# Patient Record
Sex: Male | Born: 1960 | Race: Black or African American | Hispanic: No | Marital: Single | Smoking: Current every day smoker
Health system: Southern US, Community
[De-identification: ages and names within clinical notes are randomized; demographics above are authoritative.]

## PROBLEM LIST (undated history)

## (undated) DIAGNOSIS — D638 Anemia in other chronic diseases classified elsewhere: Secondary | ICD-10-CM

## (undated) DIAGNOSIS — F17200 Nicotine dependence, unspecified, uncomplicated: Secondary | ICD-10-CM

## (undated) DIAGNOSIS — I96 Gangrene, not elsewhere classified: Secondary | ICD-10-CM

## (undated) DIAGNOSIS — E119 Type 2 diabetes mellitus without complications: Secondary | ICD-10-CM

---

## 2012-01-08 ENCOUNTER — Emergency Department: Payer: Self-pay | Admitting: Emergency Medicine

## 2012-07-14 ENCOUNTER — Emergency Department: Payer: Self-pay | Admitting: Emergency Medicine

## 2012-07-14 LAB — BASIC METABOLIC PANEL
Anion Gap: 8 (ref 7–16)
BUN: 9 mg/dL (ref 7–18)
Chloride: 106 mmol/L (ref 98–107)
Co2: 26 mmol/L (ref 21–32)
Creatinine: 0.8 mg/dL (ref 0.60–1.30)
EGFR (Non-African Amer.): >60
Osmolality: 277 (ref 275–301)
Potassium: 3.8 mmol/L (ref 3.5–5.1)

## 2012-07-14 LAB — CBC
HCT: 35.9 % — ABNORMAL LOW (ref 40.0–52.0)
MCHC: 33.8 g/dL (ref 32.0–36.0)
MCV: 100 fL (ref 80–100)
RBC: 3.59 10*6/uL — ABNORMAL LOW (ref 4.40–5.90)
WBC: 7.1 10*3/uL (ref 3.8–10.6)

## 2015-05-23 ENCOUNTER — Encounter: Payer: Self-pay | Admitting: Emergency Medicine

## 2015-05-23 ENCOUNTER — Emergency Department
Admission: EM | Admit: 2015-05-23 | Discharge: 2015-05-23 | Disposition: A | Discharge status: Home / Self Care | Payer: BLUE CROSS/BLUE SHIELD | Attending: Emergency Medicine | Admitting: Emergency Medicine

## 2015-05-23 ENCOUNTER — Emergency Department: Payer: BLUE CROSS/BLUE SHIELD

## 2015-05-23 DIAGNOSIS — M25462 Effusion, left knee: Secondary | ICD-10-CM | POA: Diagnosis not present

## 2015-05-23 DIAGNOSIS — M25562 Pain in left knee: Secondary | ICD-10-CM | POA: Diagnosis present

## 2015-05-23 DIAGNOSIS — Z720 Tobacco use: Secondary | ICD-10-CM | POA: Diagnosis not present

## 2015-05-23 DIAGNOSIS — M1712 Unilateral primary osteoarthritis, left knee: Secondary | ICD-10-CM | POA: Diagnosis not present

## 2015-05-23 MED ORDER — DEXAMETHASONE SODIUM PHOSPHATE 10 MG/ML IJ SOLN
10.0000 mg | Freq: Once | INTRAMUSCULAR | Status: AC
  Administered 2015-05-23: 10 mg via INTRA_ARTICULAR
  Filled 2015-05-23: qty 1

## 2015-05-23 MED ORDER — NAPROXEN 500 MG PO TBEC: 500.0000 mg | DELAYED_RELEASE_TABLET | Freq: Two times a day (BID) | ORAL | Status: DC

## 2015-05-23 MED ORDER — LIDOCAINE HCL (PF) 1 % IJ SOLN
5.0000 mL | Freq: Once | INTRAMUSCULAR | Status: DC
  Filled 2015-05-23: qty 5

## 2015-05-23 NOTE — ED Notes (Signed)
Patient with no complaints at this time. Respirations even and unlabored. Skin warm/dry. Discharge instructions reviewed with patient at this time. Patient given opportunity to voice concerns/ask questions. Patient discharged at this time and left Emergency Department with steady gait.   

## 2015-05-23 NOTE — ED Provider Notes (Signed)
[facility] Emergency Department Provider Note ____________________________________________  Time seen: 1955  I have reviewed the triage vital signs and the nursing notes.  HISTORY  Chief Complaint  Knee Pain  HPI [name] [name] is a 54 y.o. male reports to the ED for evaluation management of a swollen left knee without trauma. The patient admits to ongoing swelling to the left knee for about the last 6-8 weeks. He has noted disability related to fully flexing the knee at times. He notes his symptoms are aggravated by his prolonged walking and standing activities as an [employer]. He denies anyprevious episodes of joint swelling, prior joint aspiration, or any history of gout. He reports his pain at a 10/10 in triage.  History reviewed. No pertinent past medical history.  There are no active problems to display for this patient.  History reviewed. No pertinent past surgical history.  Current Outpatient Rx  Name  Route  Sig  Dispense  Refill  . naproxen (EC NAPROSYN) 500 MG EC tablet   Oral   Take 1 tablet (500 mg total) by mouth 2 (two) times daily with a meal.   30 tablet   0    Allergies Review of patient's allergies indicates not on file.  No family history on file.  Social History Social History  Substance Use Topics  . Smoking status: Current Every Day Smoker -- 0.50 packs/day    Types: Cigarettes  . Smokeless tobacco: None  . Alcohol Use: Yes   Review of Systems  Constitutional: Negative for fever. Eyes: Negative for visual changes. ENT: Negative for sore throat. Cardiovascular: Negative for chest pain. Respiratory: Negative for shortness of breath. Gastrointestinal: Negative for abdominal pain, vomiting and diarrhea. Genitourinary: Negative for dysuria. Musculoskeletal: Negative for back pain. Left knee swelling and disability as above. Skin: Negative for rash. Neurological: Negative for headaches, focal weakness or  numbness. ____________________________________________  PHYSICAL EXAM:  VITAL SIGNS: ED Triage Vitals  Enc Vitals Group     BP 05/23/15 1741 108/68 mmHg     Pulse Rate 05/23/15 1741 84     Resp 05/23/15 1741 20     Temp 05/23/15 1741 98.4 F (36.9 C)     Temp Source 05/23/15 1741 Oral     SpO2 05/23/15 1741 96 %     Weight 05/23/15 1741 240 lb (108.863 kg)     Height 05/23/15 1741  (1.93 m)     Head Cir --      Peak Flow --      Pain Score 05/23/15 1742 10     Pain Loc --      Pain Edu? --      Excl. in GC? --     Constitutional: Alert and oriented. Well appearing and in no distress. Eyes: Conjunctivae are normal. PERRL. Normal extraocular movements. ENT   Head: Normocephalic and atraumatic.   Nose: No congestion/rhinorrhea.   Mouth/Throat: Mucous membranes are moist.   Neck: Supple. No thyromegaly. Hematological/Lymphatic/Immunological: No cervical lymphadenopathy. Cardiovascular: Normal rate, regular rhythm.  Respiratory: Normal respiratory effort. No wheezes/rales/rhonchi. Gastrointestinal: Soft and nontender. No distention. Musculoskeletal: Patient's left knee with obvious bony deformity consistent with DJD. He is also noted to have a large joint effusion. He has limited flexion range second to the effusion. The knee otherwise is without indication of abrasion, laceration, warmth, erythema, or infectious process. Nontender with normal range of motion in all extremities.  Neurologic:  Normal gait without ataxia. Normal speech and language. No gross  focal neurologic deficits are appreciated. Skin:  Skin is warm, dry and intact. No rash noted. Psychiatric: Mood and affect are normal. Patient exhibits appropriate insight and judgment. ____________________________________________   RADIOLOGY Left Knee IMPRESSION: Mildly progressive tricompartmental osteoarthritis with large joint effusion, similar to prior study. No acute osseous findings.  I, Lankford Gutzmer,  Charlesetta Ivory, personally viewed and evaluated these images (plain radiographs) as part of my medical decision making.  ____________________________________________  PROCEDURES  Apiration of blood/fluid  Performed by: Lissa Hoard  After consent was obtained, using sterile technique the left knee was prepped and 3 ml's of 1% plain Lidocaine used to anesthetize the needle tract into the joint from the Lateral suprapatellar approach.   The knee joint was entered and 140 ml's of clear, yellow colored fluid was withdrawn and sent for culture.  Steroid 10 mg was then injected and the needle withdrawn.    The procedure was well tolerated.   ____________________________________________  INITIAL IMPRESSION / ASSESSMENT AND PLAN / ED COURSE  Moderate left knee osteoarthritis with a chronic joint effusion. Patient with a therapeutic joint aspiration.  The patient is asked to continue to rest the knee for a few more days before resuming regular activities.  It may be more painful for the first 1-2 days.  Watch for fever, or increased swelling or persistent pain in knee. Call or return to clinic prn if such symptoms occur or the knee fails to improve as anticipated. Prescription Naprosyn was provided and a work note for out of work 1 days given. Follow Dr. Marinus Maw, if needed. ____________________________________________  FINAL CLINICAL IMPRESSION(S) / ED DIAGNOSES  Final diagnoses:  Primary osteoarthritis of left knee  Knee effusion, left      Lissa Hoard, PA-C 05/23/15 2150  Rockne Menghini, MD 06/04/15 [phone]

## 2015-05-23 NOTE — Discharge Instructions (Signed)
Osteoarthritis Osteoarthritis is a disease that causes soreness and inflammation of a joint. It occurs when the cartilage at the affected joint wears down. Cartilage acts as a cushion, covering the ends of bones where they meet to form a joint. Osteoarthritis is the most common form of arthritis. It often occurs in older people. The joints affected most often by this condition include those in the:  Ends of the fingers.  Thumbs.  Neck.  Lower back.  Knees.  Hips. CAUSES  Over time, the cartilage that covers the ends of bones begins to wear away. This causes bone to rub on bone, producing pain and stiffness in the affected joints.  RISK FACTORS Certain factors can increase your chances of having osteoarthritis, including:  Older age.  Excessive body weight.  Overuse of joints.  Previous joint injury. SIGNS AND SYMPTOMS   Pain, swelling, and stiffness in the joint.  Over time, the joint may lose its normal shape.  Small deposits of bone (osteophytes) may grow on the edges of the joint.  Bits of bone or cartilage can break off and float inside the joint space. This may cause more pain and damage. DIAGNOSIS  Your health care provider will do a physical exam and ask about your symptoms. Various tests may be ordered, such as:  X-rays of the affected joint.  An MRI scan.  Blood tests to rule out other types of arthritis.  Joint fluid tests. This involves using a needle to draw fluid from the joint and examining the fluid under a microscope. TREATMENT  Goals of treatment are to control pain and improve joint function. Treatment plans may include:  A prescribed exercise program that allows for rest and joint relief.  A weight control plan.  Pain relief techniques, such as:  Properly applied heat and cold.  Electric pulses delivered to nerve endings under the skin (transcutaneous electrical nerve stimulation [TENS]).  Massage.  Certain nutritional  supplements.  Medicines to control pain, such as:  Acetaminophen.  Nonsteroidal anti-inflammatory drugs (NSAIDs), such as naproxen.  Narcotic or central-acting agents, such as tramadol.  Corticosteroids. These can be given orally or as an injection.  Surgery to reposition the bones and relieve pain (osteotomy) or to remove loose pieces of bone and cartilage. Joint replacement may be needed in advanced states of osteoarthritis. HOME CARE INSTRUCTIONS   Take medicines only as directed by your health care provider.  Maintain a healthy weight. Follow your health care provider's instructions for weight control. This may include dietary instructions.  Exercise as directed. Your health care provider can recommend specific types of exercise. These may include:  Strengthening exercises. These are done to strengthen the muscles that support joints affected by arthritis. They can be performed with weights or with exercise bands to add resistance.  Aerobic activities. These are exercises, such as brisk walking or low-impact aerobics, that get your heart pumping.  Range-of-motion activities. These keep your joints limber.  Balance and agility exercises. These help you maintain daily living skills.  Rest your affected joints as directed by your health care provider.  Keep all follow-up visits as directed by your health care provider. SEEK MEDICAL CARE IF:   Your skin turns red.  You develop a rash in addition to your joint pain.  You have worsening joint pain.  You have a fever along with joint or muscle aches. SEEK IMMEDIATE MEDICAL CARE IF:  You have a significant loss of weight or appetite.  You have night sweats. FOR MORE  INFORMATION   [employer] of Arthritis and Musculoskeletal and Skin Diseases: www.niams.http://www.myers.net/  [employer] on Aging: https://walker.com/  American College of Rheumatology: www.rheumatology.org Document Released: 08/14/2005 Document Revised:  12/29/2013 Document Reviewed: 04/21/2013 [facility] Patient Information 2015 [place], [place]. This information is not intended to replace advice given to you by your health care provider. Make sure you discuss any questions you have with your health care provider. Knee Effusion  Knee effusion means you have fluid in your knee. The knee may be more difficult to bend and move. HOME CARE  Use crutches or a brace as told by your doctor.  Put ice on the injured area.  Put ice in a plastic bag.  Place a towel between your skin and the bag.  Leave the ice on for 15-20 minutes, 03-04 times a day.  Raise (elevate) your knee as much as possible.  Only take medicine as told by your doctor.  You may need to do strengthening exercises. Ask your doctor.  Continue with your normal diet and activities as told by your doctor. GET HELP RIGHT AWAY IF:  You have more puffiness (swelling) in your knee.  You see redness, puffiness, or have more pain in your knee.  You have a temperature by mouth above 102 F (38.9 C).  You get a rash.  You have trouble breathing.  You have a reaction to any medicine you are taking.  You have a lot of pain when you move your knee. MAKE SURE YOU:  Understand these instructions.  Will watch your condition.  Will get help right away if you are not doing well or get worse. Document Released: 09/16/2010 Document Revised: 11/06/2011 Document Reviewed: 09/16/2010 [facility] Patient Information 2015 [place], [place]. This information is not intended to replace advice given to you by your health care provider. Make sure you discuss any questions you have with your health care provider.  Knee Injection Joint injections are shots. Your caregiver will place a needle into your knee joint. The needle is used to put medicine into the joint. These shots can be used to help treat different painful knee conditions such as osteoarthritis, bursitis, local flare-ups of rheumatoid  arthritis, and pseudogout. Anti-inflammatory medicines such as corticosteroids and anesthetics are the most common medicines used for joint and soft tissue injections.  PROCEDURE  The skin over the kneecap will be cleaned with an antiseptic solution.  Your caregiver will inject a small amount of a local anesthetic (a medicine like Novocaine) just under the skin in the area that was cleaned.  After the area becomes numb, a second injection is done. This second injection usually includes an anesthetic and an anti-inflammatory medicine called a steroid or cortisone. The needle is carefully placed in between the kneecap and the knee, and the medicine is injected into the joint space.  After the injection is done, the needle is removed. Your caregiver may place a bandage over the injection site. The whole procedure takes no more than a couple of minutes. BEFORE THE PROCEDURE  Wash all of the skin around the entire knee area. Try to remove any loose, scaling skin. There is no other specific preparation necessary unless advised otherwise by your caregiver. LET YOUR CAREGIVER KNOW ABOUT:   Allergies.  Medications taken including herbs, eye drops, over the counter medications, and creams.  Use of steroids (by mouth or creams).  Possible pregnancy, if applicable.  Previous problems with anesthetics or Novocaine.  History of blood clots (thrombophlebitis).  History of bleeding  or blood problems.  Previous surgery.  Other health problems. RISKS AND COMPLICATIONS Side effects from cortisone shots are rare. They include:   Slight bruising of the skin.  Shrinkage of the normal fatty tissue under the skin where the shot was given.  Increase in pain after the shot.  Infection.  Weakening of tendons or tendon rupture.  Allergic reaction to the medicine.  Diabetics may have a temporary increase in their blood sugar after a shot.  Cortisone can temporarily weaken the immune system. While  receiving these shots, you should not get certain vaccines. Also, avoid contact with anyone who has chickenpox or measles. Especially if you have never had these diseases or have not been previously immunized. Your immune system may not be strong enough to fight off the infection while the cortisone is in your system. AFTER THE PROCEDURE   You can go home after the procedure.  You may need to put ice on the joint 15-20 minutes every 3 or 4 hours until the pain goes away.  You may need to put an elastic bandage on the joint. HOME CARE INSTRUCTIONS   Only take over-the-counter or prescription medicines for pain, discomfort, or fever as directed by your caregiver.  You should avoid stressing the joint. Unless advised otherwise, avoid activities that put a lot of pressure on a knee joint, such as:  Jogging.  Bicycling.  Recreational climbing.  Hiking.  Laying down and elevating the leg/knee above the level of your heart can help to minimize swelling. SEEK MEDICAL CARE IF:   You have repeated or worsening swelling.  There is drainage from the puncture area.  You develop red streaking that extends above or below the site where the needle was inserted. SEEK IMMEDIATE MEDICAL CARE IF:   You develop a fever.  You have pain that gets worse even though you are taking pain medicine.  The area is red and warm, and you have trouble moving the joint. MAKE SURE YOU:   Understand these instructions.  Will watch your condition.  Will get help right away if you are not doing well or get worse. Document Released: 11/05/2006 Document Revised: 11/06/2011 Document Reviewed: 08/02/2007 [facility] Patient Information 2015 [place], [place]. This information is not intended to replace advice given to you by your health care provider. Make sure you discuss any questions you have with your health care provider.  Rest with the knee elevated when seated. Apply ice to reduce pain and swelling. Take the  prescription anti-inflammatory as directed for pain and swelling. Follow-up with a primary care provider or Dr. Rosita Kea as needed.

## 2015-05-23 NOTE — ED Notes (Signed)
Pt presents to the ER with left knee pain that started about 2 days ago per pt. Noticeable swelling to left knee, pt denies any injury to area.

## 2015-05-27 LAB — BODY FLUID CULTURE
Culture: NO GROWTH
Gram Stain: NONE SEEN
Special Requests: NORMAL

## 2015-07-11 ENCOUNTER — Inpatient Hospital Stay
Admission: EM | Admit: 2015-07-11 | Discharge: 2015-07-14 | DRG: 853 | Disposition: A | Discharge status: Home / Self Care | Payer: BLUE CROSS/BLUE SHIELD | Attending: Internal Medicine | Admitting: Internal Medicine

## 2015-07-11 ENCOUNTER — Encounter: Payer: Self-pay | Admitting: Emergency Medicine

## 2015-07-11 ENCOUNTER — Inpatient Hospital Stay: Payer: BLUE CROSS/BLUE SHIELD

## 2015-07-11 ENCOUNTER — Emergency Department: Payer: BLUE CROSS/BLUE SHIELD

## 2015-07-11 DIAGNOSIS — A419 Sepsis, unspecified organism: Secondary | ICD-10-CM | POA: Diagnosis present

## 2015-07-11 DIAGNOSIS — I96 Gangrene, not elsewhere classified: Secondary | ICD-10-CM | POA: Diagnosis present

## 2015-07-11 DIAGNOSIS — E860 Dehydration: Secondary | ICD-10-CM | POA: Diagnosis present

## 2015-07-11 DIAGNOSIS — R6510 Systemic inflammatory response syndrome (SIRS) of non-infectious origin without acute organ dysfunction: Secondary | ICD-10-CM

## 2015-07-11 DIAGNOSIS — E876 Hypokalemia: Secondary | ICD-10-CM | POA: Diagnosis present

## 2015-07-11 DIAGNOSIS — F17210 Nicotine dependence, cigarettes, uncomplicated: Secondary | ICD-10-CM | POA: Diagnosis present

## 2015-07-11 DIAGNOSIS — D638 Anemia in other chronic diseases classified elsewhere: Secondary | ICD-10-CM

## 2015-07-11 DIAGNOSIS — L0390 Cellulitis, unspecified: Secondary | ICD-10-CM | POA: Diagnosis present

## 2015-07-11 DIAGNOSIS — L97509 Non-pressure chronic ulcer of other part of unspecified foot with unspecified severity: Secondary | ICD-10-CM | POA: Diagnosis present

## 2015-07-11 DIAGNOSIS — D72829 Elevated white blood cell count, unspecified: Secondary | ICD-10-CM

## 2015-07-11 DIAGNOSIS — L089 Local infection of the skin and subcutaneous tissue, unspecified: Secondary | ICD-10-CM | POA: Diagnosis not present

## 2015-07-11 DIAGNOSIS — Z7289 Other problems related to lifestyle: Secondary | ICD-10-CM | POA: Diagnosis not present

## 2015-07-11 DIAGNOSIS — Z720 Tobacco use: Secondary | ICD-10-CM

## 2015-07-11 DIAGNOSIS — E11621 Type 2 diabetes mellitus with foot ulcer: Secondary | ICD-10-CM

## 2015-07-11 DIAGNOSIS — A480 Gas gangrene: Secondary | ICD-10-CM | POA: Diagnosis present

## 2015-07-11 LAB — CBC WITH DIFFERENTIAL/PLATELET
BASOS ABS: 0.1 10*3/uL (ref 0–0.1)
Basophils Relative: 1 %
EOS PCT: 2 %
Eosinophils Absolute: 0.3 10*3/uL (ref 0–0.7)
HEMATOCRIT: 40.3 % (ref 40.0–52.0)
Hemoglobin: 13.1 g/dL (ref 13.0–18.0)
LYMPHS ABS: 1.1 10*3/uL (ref 1.0–3.6)
LYMPHS PCT: 6 %
MCH: 32.7 pg (ref 26.0–34.0)
MCHC: 32.6 g/dL (ref 32.0–36.0)
MCV: 100.1 fL — AB (ref 80.0–100.0)
MONO ABS: 1.8 10*3/uL — AB (ref 0.2–1.0)
MONOS PCT: 10 %
Neutro Abs: 15.3 10*3/uL — ABNORMAL HIGH (ref 1.4–6.5)
Neutrophils Relative %: 81 %
PLATELETS: 419 10*3/uL (ref 150–440)
RBC: 4.02 MIL/uL — AB (ref 4.40–5.90)
RDW: 11.8 % (ref 11.5–14.5)
WBC: 18.6 10*3/uL — ABNORMAL HIGH (ref 3.8–10.6)

## 2015-07-11 LAB — COMPREHENSIVE METABOLIC PANEL
ALT: 30 U/L (ref 17–63)
AST: 21 U/L (ref 15–41)
Albumin: 3.3 g/dL — ABNORMAL LOW (ref 3.5–5.0)
Alkaline Phosphatase: 94 U/L (ref 38–126)
Anion gap: 12 (ref 5–15)
BUN: 7 mg/dL (ref 6–20)
CHLORIDE: 100 mmol/L — AB (ref 101–111)
CO2: 24 mmol/L (ref 22–32)
CREATININE: 0.69 mg/dL (ref 0.61–1.24)
Calcium: 8.9 mg/dL (ref 8.9–10.3)
GFR calc Af Amer: >60 mL/min (ref >60)
GLUCOSE: 73 mg/dL (ref 65–99)
Potassium: 3.3 mmol/L — ABNORMAL LOW (ref 3.5–5.1)
Sodium: 136 mmol/L (ref 135–145)
Total Bilirubin: 0.8 mg/dL (ref 0.3–1.2)
Total Protein: 7.6 g/dL (ref 6.5–8.1)

## 2015-07-11 LAB — SURGICAL PCR SCREEN
MRSA, PCR: NEGATIVE
STAPHYLOCOCCUS AUREUS: NEGATIVE

## 2015-07-11 MED ORDER — POTASSIUM CHLORIDE IN NACL 20-0.9 MEQ/L-% IV SOLN
INTRAVENOUS | Status: DC
  Administered 2015-07-11 – 2015-07-14 (×5): via INTRAVENOUS
  Filled 2015-07-11 (×10): qty 1000

## 2015-07-11 MED ORDER — ONDANSETRON HCL 4 MG/2ML IJ SOLN
4.0000 mg | Freq: Four times a day (QID) | INTRAMUSCULAR | Status: DC | PRN
Start: 2015-07-11 — End: 2015-07-14

## 2015-07-11 MED ORDER — ACETAMINOPHEN 325 MG PO TABS: 650.0000 mg | ORAL_TABLET | Freq: Four times a day (QID) | ORAL | Status: DC | PRN

## 2015-07-11 MED ORDER — ONDANSETRON HCL 4 MG PO TABS: 4.0000 mg | ORAL_TABLET | Freq: Four times a day (QID) | ORAL | Status: DC | PRN

## 2015-07-11 MED ORDER — ACETAMINOPHEN 650 MG RE SUPP: 650.0000 mg | Freq: Four times a day (QID) | RECTAL | Status: DC | PRN

## 2015-07-11 MED ORDER — VANCOMYCIN HCL IN DEXTROSE 1-5 GM/200ML-% IV SOLN
1000.0000 mg | Freq: Once | INTRAVENOUS | Status: AC
  Administered 2015-07-11: 1000 mg via INTRAVENOUS
  Filled 2015-07-11: qty 200

## 2015-07-11 MED ORDER — HYDROCODONE-ACETAMINOPHEN 5-325 MG PO TABS
1.0000 | ORAL_TABLET | ORAL | Status: DC | PRN
  Administered 2015-07-11: 1 via ORAL
  Administered 2015-07-11 – 2015-07-13 (×2): 2 via ORAL
  Filled 2015-07-11: qty 1
  Filled 2015-07-11 (×2): qty 2

## 2015-07-11 MED ORDER — DOCUSATE SODIUM 100 MG PO CAPS
100.0000 mg | ORAL_CAPSULE | Freq: Two times a day (BID) | ORAL | Status: DC
  Administered 2015-07-11 – 2015-07-14 (×6): 100 mg via ORAL
  Filled 2015-07-11 (×6): qty 1

## 2015-07-11 MED ORDER — ASPIRIN EC 81 MG PO TBEC
81.0000 mg | DELAYED_RELEASE_TABLET | Freq: Every day | ORAL | Status: DC
  Administered 2015-07-11 – 2015-07-14 (×3): 81 mg via ORAL
  Filled 2015-07-11 (×3): qty 1

## 2015-07-11 MED ORDER — BISACODYL 10 MG RE SUPP: 10.0000 mg | Freq: Every day | RECTAL | Status: DC | PRN

## 2015-07-11 MED ORDER — PIPERACILLIN-TAZOBACTAM 3.375 G IVPB
3.3750 g | Freq: Once | INTRAVENOUS | Status: AC
  Administered 2015-07-11: 3.375 g via INTRAVENOUS
  Filled 2015-07-11: qty 50

## 2015-07-11 MED ORDER — PIPERACILLIN-TAZOBACTAM 3.375 G IVPB
3.3750 g | Freq: Three times a day (TID) | INTRAVENOUS | Status: DC
  Administered 2015-07-11 – 2015-07-14 (×9): 3.375 g via INTRAVENOUS
  Filled 2015-07-11 (×12): qty 50

## 2015-07-11 MED ORDER — HEPARIN SODIUM (PORCINE) 5000 UNIT/ML IJ SOLN
5000.0000 [IU] | Freq: Three times a day (TID) | INTRAMUSCULAR | Status: DC
  Administered 2015-07-11 – 2015-07-14 (×6): 5000 [IU] via SUBCUTANEOUS
  Filled 2015-07-11 (×6): qty 1

## 2015-07-11 MED ORDER — VANCOMYCIN HCL 10 G IV SOLR
1250.0000 mg | Freq: Two times a day (BID) | INTRAVENOUS | Status: DC
  Administered 2015-07-11 – 2015-07-13 (×4): 1250 mg via INTRAVENOUS
  Filled 2015-07-11 (×5): qty 1250

## 2015-07-11 MED ORDER — PANTOPRAZOLE SODIUM 40 MG PO TBEC
40.0000 mg | DELAYED_RELEASE_TABLET | Freq: Every day | ORAL | Status: DC
  Administered 2015-07-11 – 2015-07-14 (×3): 40 mg via ORAL
  Filled 2015-07-11 (×3): qty 1

## 2015-07-11 MED ORDER — IBUPROFEN 400 MG PO TABS
400.0000 mg | ORAL_TABLET | Freq: Four times a day (QID) | ORAL | Status: DC | PRN
  Administered 2015-07-12: 400 mg via ORAL
  Filled 2015-07-11: qty 1

## 2015-07-11 NOTE — H&P (Signed)
History and Physical    [name] PitchBarney L Mey ZOX:[id number] DOB: July 23, 1961 DOA: 07/11/2015  Referring physician: Dr. Cyril LoosenKinner PCP: No primary care provider on file.  Specialists: none  Chief Complaint: toe pain  HPI: [name] [name] is a 54 y.o. male has a past medical history significant for questionable gout on no meds and not followed by a doctor regularly who presents to ER with 3 week hx of increasing left great toe pain and swelling. Tried Advil OTC with no improvement. In ER, pt's left great toe is swollen and red with purulence and necrosis. WBC elevated. He is now admitted for further evaluation.  Review of Systems: The patient denies anorexia, fever, weight loss,, vision loss, decreased hearing, hoarseness, chest pain, syncope, dyspnea on exertion, peripheral edema, balance deficits, hemoptysis, abdominal pain, melena, hematochezia, severe indigestion/heartburn, hematuria, incontinence, genital sores, muscle weakness, suspicious skin lesions, transient blindness, difficulty walking, depression, unusual weight change, abnormal bleeding, enlarged lymph nodes, angioedema, and breast masses.   History reviewed. No pertinent past medical history. History reviewed. No pertinent past surgical history. Social History:  reports that he has been smoking Cigarettes.  He has been smoking about 0.50 packs per day. He does not have any smokeless tobacco history on file. He reports that he drinks alcohol. His drug history is not on file.  No Known Allergies  History reviewed. No pertinent family history.  Prior to Admission medications   Medication Sig Start Date End Date Taking? Authorizing Provider  ibuprofen (ADVIL,MOTRIN) 200 MG tablet Take 400 mg by mouth every 6 (six) hours as needed for mild pain or moderate pain.   Yes Historical Provider, MD  naproxen (EC NAPROSYN) 500 MG EC tablet Take 1 tablet (500 mg total) by mouth 2 (two) times daily with a meal. 05/23/15   Lissa HoardJenise V Bacon Menshew, PA-C    Physical Exam: Filed Vitals:   07/11/15 1100 07/11/15 1200 07/11/15 1230  BP: 141/73 150/79 139/79  Pulse: 98 92 99  Temp: 99.1 F (37.3 C)    Resp: 18 13 19   Height: 6\' 4"  (1.93 m)    Weight: 108.863 kg (240 lb)    SpO2: 98% 95% 95%     General:  No apparent distress  Eyes: PERRL, EOMI, no scleral icterus  ENT: moist oropharynx  Neck: supple, no lymphadenopathy  Cardiovascular: regular rate without MRG; 2+ peripheral pulses, no JVD, no peripheral edema  Respiratory: CTA biL, good air movement without wheezing, rhonchi or crackled  Abdomen: soft, non tender to palpation, positive bowel sounds, no guarding, no rebound  Skin: no rashes. Left great toe is red/warm with swelling and necrosis.  Musculoskeletal: normal bulk and tone, no joint swelling  Psychiatric: normal mood and affect  Neurologic: CN 2-12 grossly intact, MS 5/5 in all 4  Labs on Admission:  Basic Metabolic Panel:  Recent Labs Lab 07/11/15 1233  NA 136  K 3.3*  CL 100*  CO2 24  GLUCOSE 73  BUN 7  CREATININE 0.69  CALCIUM 8.9   Liver Function Tests:  Recent Labs Lab 07/11/15 1233  AST 21  ALT 30  ALKPHOS 94  BILITOT 0.8  PROT 7.6  ALBUMIN 3.3*   No results for input(s): LIPASE, AMYLASE in the last 168 hours. No results for input(s): AMMONIA in the last 168 hours. CBC:  Recent Labs Lab 07/11/15 1233  WBC 18.6*  NEUTROABS 15.3*  HGB 13.1  HCT 40.3  MCV 100.1*  PLT 419   Cardiac Enzymes: No results for input(s):  CKTOTAL, CKMB, CKMBINDEX, TROPONINI in the last 168 hours.  BNP (last 3 results) No results for input(s): BNP in the last 8760 hours.  ProBNP (last 3 results) No results for input(s): PROBNP in the last 8760 hours.  CBG: No results for input(s): GLUCAP in the last 168 hours.  Radiological Exams on Admission: Dg Foot Complete Left  07/11/2015  CLINICAL DATA:  Gangrenous left great toe for 3 weeks. EXAM: LEFT FOOT - COMPLETE 3+ VIEW COMPARISON:  None.  FINDINGS: Left great toe soft tissue emphysema noted compatible with soft tissue infection. No definite underlying osseous destruction, cortical loss, or bone lucency by plain radiography. Normal alignment. No acute fracture. Diffuse left foot soft tissue swelling on the lateral view. IMPRESSION: Left great toe soft tissue emphysema, compatible with infection/gangrene. No definite underlying acute osseous finding or fracture Electronically Signed   By: [name].  Shick M.D.   On: 07/11/2015 11:35    EKG: Independently reviewed.  Assessment/Plan Active Problems:   SIRS (systemic inflammatory response syndrome) (HCC)   Gangrene (HCC)   Will begin IV ABX. Cultures sent. Consult Podiatry and Vascular. Repeat labs in AM.  Diet: low sodium Fluids: NS with K+@100  DVT Prophylaxis: SQ Heparin  Code Status: FULL  Family Communication: none  Disposition Plan: Home  Time spent: 50 min

## 2015-07-11 NOTE — Consult Note (Signed)
         Consult Note  Patient name: [name] [name] L Bosso MRN: [id number] DOB: April 29, 1961 Sex: male  Consulting Physician:  Hospital service  Reason for Consult:  Chief Complaint  Patient presents with  . Foot Pain    HISTORY OF PRESENT ILLNESS: This is a 54 year old male who was admitted with pain and swelling in his left great toe.  This has been present for 3 weeks and getting worse.  He has been taking advil for pain.  He says his father had a similar problem and lost his toe.  He thought this was a gout exacerbation.  The patient has no major medical problems, but does not seek regular medical services.  He is a smoker.  History reviewed. No pertinent past medical history.  History reviewed. No pertinent past surgical history.  Social History   Social History  . Marital Status: Single    Spouse Name: N/A  . Number of Children: N/A  . Years of Education: N/A   Occupational History  . Not on file.   Social History Main Topics  . Smoking status: Current Every Day Smoker -- 0.50 packs/day    Types: Cigarettes  . Smokeless tobacco: Not on file  . Alcohol Use: Yes  . Drug Use: Not on file  . Sexual Activity: Not on file   Other Topics Concern  . Not on file   Social History Narrative    History reviewed. No pertinent family history.  Allergies as of 07/11/2015  . (No Known Allergies)    No current facility-administered medications on file prior to encounter.   Current Outpatient Prescriptions on File Prior to Encounter  Medication Sig Dispense Refill  . naproxen (EC NAPROSYN) 500 MG EC tablet Take 1 tablet (500 mg total) by mouth 2 (two) times daily with a meal. (Patient not taking: Reported on 07/11/2015) 30 tablet 0     REVIEW OF SYSTEMS: See HPI, otherwise negative  PHYSICAL EXAMINATION: General: The patient appears their stated age.  Vital signs are BP 123/74 mmHg  Pulse 104  Temp(Src) 100.9 F (38.3 C) (Oral)  Resp 20  Ht 6\' 4"  (1.93 m)  Wt 220 lb  1.6 oz (99.837 kg)  BMI 26.80 kg/m2  SpO2 97% Pulmonary: Respirations are non-labored HEENT:  No gross abnormalities Abdomen: Soft and non-tender  Musculoskeletal: There are no major deformities.   Neurologic: No focal weakness or paresthesias are detected, Skin: edema to left great toe with purulent drainage and foul odorPsychiatric: The patient has normal affect. Cardiovascular: There is a regular rate and rhythm without significant murmur appreciated.  Palpable left DP pulse.  Palpable right PT.  Significant edema to left foot  Diagnostic Studies: Foot xrays pending   Assessment:  Left great toe infection Plan: The patient has palpable pedal pulses despite the infection to his left great toe.  No further vascular evaluation is needed.  Podiatry is planning operative exploration tomorrow.  Please contact us for any further questions     V. Charlena CrossWells Brabham IV, M.D. [facility] Office: [phone] Pager:  [phone]

## 2015-07-11 NOTE — Consult Note (Signed)
ORTHOPAEDIC CONSULTATION  REQUESTING PHYSICIAN: Enid Baas, MD  Chief Complaint: Painful left great toe  HPI: [name] [name] is a 54 y.o. male who complains of  painful left great toe for the last 3 weeks. He stated it became swollen approximately 3 weeks ago. It opened up recently and began draining. Seen in the ER and he had noted severe infection and drainage with foul odor. He denies a history of diabetes. He denies any recent fever or chills. Had some pain to the toe. States she does not have a primary care physician.  History reviewed. No pertinent past medical history. History reviewed. No pertinent past surgical history. Social History   Social History  . Marital Status: Single    Spouse Name: N/A  . Number of Children: N/A  . Years of Education: N/A   Social History Main Topics  . Smoking status: Current Every Day Smoker -- 0.50 packs/day    Types: Cigarettes  . Smokeless tobacco: None  . Alcohol Use: Yes  . Drug Use: None  . Sexual Activity: Not Asked   Other Topics Concern  . None   Social History Narrative   History reviewed. No pertinent family history. No Known Allergies Prior to Admission medications   Medication Sig Start Date End Date Taking? Authorizing Provider  ibuprofen (ADVIL,MOTRIN) 200 MG tablet Take 400 mg by mouth every 6 (six) hours as needed for mild pain or moderate pain.   Yes Historical Provider, MD  naproxen (EC NAPROSYN) 500 MG EC tablet Take 1 tablet (500 mg total) by mouth 2 (two) times daily with a meal. Patient not taking: Reported on 07/11/2015 05/23/15   Charlesetta Ivory Menshew, PA-C   Dg Foot Complete Left  07/11/2015  CLINICAL DATA:  Gangrenous left great toe for 3 weeks. EXAM: LEFT FOOT - COMPLETE 3+ VIEW COMPARISON:  None. FINDINGS: Left great toe soft tissue emphysema noted compatible with soft tissue infection. No definite underlying osseous destruction, cortical loss, or bone lucency by plain radiography. Normal alignment.  No acute fracture. Diffuse left foot soft tissue swelling on the lateral view. IMPRESSION: Left great toe soft tissue emphysema, compatible with infection/gangrene. No definite underlying acute osseous finding or fracture Electronically Signed   By: [name].  Shick M.D.   On: 07/11/2015 11:35    Positive ROS: All other systems have been reviewed and were otherwise negative with the exception of those mentioned in the HPI and as above.  12 point ROS was performed.  CBC Latest Ref Rng 07/11/2015 07/14/2012  WBC 3.8 - 10.6 K/uL 18.6(H) 7.1  Hemoglobin 13.0 - 18.0 g/dL 13.0 12.1(L)  Hematocrit 40.0 - 52.0 % 40.3 35.9(L)  Platelets 150 - 440 K/uL 419 310     Physical Exam: General: Alert and oriented.  No apparent distress.  Vascular:  Left foot:Dorsalis Pedis:  present Posterior Tibial:  present. They are somewhat diminished secondary to the large amount of edema  Right foot: Dorsalis Pedis:  present Posterior Tibial:  present  Neuro:absent protective sensation  Derm: The right foot plantar to the second MTPJ there is a pre-ulcerative hyperkeratotic lesion with what is likely a blister deep to the area. His right fourth toe is also edematous and slightly darkened. There is an open ulceration at the PIPJ of the fourth toe. The fourth toe does not have any drainage at all from the PIPJ. No other signs of ulcerative lesions on the right foot.  His left foot has necrosis on the distal aspect with severe edema  and purulent drainage from the nail and nailbed as well as surrounding skin to the lateral aspect of the great toe. No other ulcer sites on the left foot.  Ortho/MS: Large amount of edema to the left foot is noted. No severe pain with palpation or range of motion of the left foot. Pain to the right foot. Has demonstrated good dorsiflexion and plantar flexion range of motion. Full muscle strength to all major muscles of the lower extremity.   Assessment: Gas producing infection left great toe  with necrotic ulcer on the distal aspect. 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 Concern for infection to the right fourth toe as well.  Plan: He will need I&D and likely partial amputation of the left great toe in the OR tomorrow. I discussed the surgical procedure with him in great detail. The risks benefits alternatives and competitions have been discussed and informed consent has been given. We'll obtain a culture of his left great toe drainage as well. Blood cultures are at this time. His white blood cell count is markedly elevated. Patient will be allowed a liquid breakfast and then nothing by mouth thereafter. Surgery tomorrow evening.  X-ray of his right foot will be obtained as I'm concerned about his right fourth toe as well.    Irean HongFowler, Graycie Halley A, DPM Cell [phone]   07/11/2015 7:44 PM

## 2015-07-11 NOTE — ED Provider Notes (Signed)
[facility] Emergency Department Provider Note  ____________________________________________  Time seen: On arrival  I have reviewed the triage vital signs and the nursing notes.   HISTORY  Chief Complaint Foot Pain    HPI JERAMIAH MCCAUGHEY is a 54 y.o. male who presents with complaints of left toe pain which she believes may be related to gout. Says it is because become worse in the last few days and he has increased swelling and pain. He reports it has been painful and swollen for approximately 3 weeks. He denies a history of diabetes. No injury to the area. He denies fevers or chills. He denies polyuria or polydipsia. He does smoke cigarettes     History reviewed. No pertinent past medical history.  There are no active problems to display for this patient.   History reviewed. No pertinent past surgical history.  Current Outpatient Rx  Name  Route  Sig  Dispense  Refill  . ibuprofen (ADVIL,MOTRIN) 200 MG tablet   Oral   Take 400 mg by mouth every 6 (six) hours as needed for mild pain or moderate pain.         . naproxen (EC NAPROSYN) 500 MG EC tablet   Oral   Take 1 tablet (500 mg total) by mouth 2 (two) times daily with a meal.   30 tablet   0     Allergies Review of patient's allergies indicates no known allergies.  No family history on file.  Social History Social History  Substance Use Topics  . Smoking status: Current Every Day Smoker -- 0.50 packs/day    Types: Cigarettes  . Smokeless tobacco: None  . Alcohol Use: Yes    Review of Systems  Constitutional: Negative for fever. Eyes: Negative for visual changes. ENT: Negative for sore throat Cardiovascular: Negative for chest pain. Respiratory: Negative for shortness of breath. Gastrointestinal: Negative for abdominal pain, vomiting and diarrhea. Genitourinary: Negative for dysuria. Musculoskeletal: Negative for back pain. Positive for toe pain Skin: Negative for  rash. Neurological: Negative for headaches or focal weakness Psychiatric: No anxiety    ____________________________________________   PHYSICAL EXAM:  VITAL SIGNS: ED Triage Vitals  Enc Vitals Group     BP 07/11/15 1100 141/73 mmHg     Pulse Rate 07/11/15 1100 98     Resp 07/11/15 1100 18     Temp 07/11/15 1100 99.1 F (37.3 C)     Temp src --      SpO2 07/11/15 1100 98 %     Weight 07/11/15 1100 240 lb (108.863 kg)     Height 07/11/15 1100  (1.93 m)     Head Cir --      Peak Flow --      Pain Score 07/11/15 1101 10     Pain Loc --      Pain Edu? --      Excl. in GC? --      Constitutional: Alert and oriented. Well appearing and in no distress. Eyes: Conjunctivae are normal.  ENT   Head: Normocephalic and atraumatic.   Mouth/Throat: Mucous membranes are moist. Cardiovascular: Normal rate, regular rhythm. Normal and symmetric distal pulses are present in all extremities. No murmurs, rubs, or gallops. Respiratory: Normal respiratory effort without tachypnea nor retractions. Breath sounds are clear and equal bilaterally.  Gastrointestinal: Soft and non-tender in all quadrants. No distention. There is no CVA tenderness. Genitourinary: deferred Musculoskeletal: Left great toe is significantly swollen erythematous with areas of gangrenous changes with  extraordinarily foul odor. His foot is swollen as well with erythema extending away from the great toe. No crepitus felt. Foot is warm and well perfused Neurologic:  Normal speech and language. No gross focal neurologic deficits are appreciated. Skin:  Skin is warm, dry and intact. No rash noted. Psychiatric: Mood and affect are normal. Patient exhibits appropriate insight and judgment.  ____________________________________________    LABS (pertinent positives/negatives)  Labs Reviewed  CBC WITH DIFFERENTIAL/PLATELET - Abnormal; Notable for the following:    WBC 18.6 (*)    RBC 4.02 (*)    MCV 100.1 (*)     Neutro Abs 15.3 (*)    Monocytes Absolute 1.8 (*)    All other components within normal limits  COMPREHENSIVE METABOLIC PANEL - Abnormal; Notable for the following:    Potassium 3.3 (*)    Chloride 100 (*)    Albumin 3.3 (*)    All other components within normal limits  CULTURE, BLOOD (ROUTINE X 2)  CULTURE, BLOOD (ROUTINE X 2)    ____________________________________________   EKG  ED ECG REPORT I, Jene EveryKINNER, Adelle Zachar, the attending physician, personally viewed and interpreted this ECG.   Date: 07/11/2015  EKG Time:  11:36 AM  Rate: 99  Rhythm: normal sinus rhythm  Axis: Normal  Intervals:none  ST&T Change: Nonspecific   ____________________________________________    RADIOLOGY I have personally reviewed any xrays that were ordered on this patient: Left great toe soft tissue emphysema  ____________________________________________   PROCEDURES  Procedure(s) performed: none  Critical Care performed: yes  CRITICAL CARE Performed by: Jene EveryKINNER, Skylen Danielsen   Total critical care time: 20 minutes  Critical care time was exclusive of separately billable procedures and treating other patients.  Critical care was necessary to treat or prevent imminent or life-threatening deterioration.  Critical care was time spent personally by me on the following activities: development of treatment plan with patient and/or surrogate as well as nursing, discussions with consultants, evaluation of patient's response to treatment, examination of patient, obtaining history from patient or surrogate, ordering and performing treatments and interventions, ordering and review of laboratory studies, ordering and review of radiographic studies, pulse oximetry and re-evaluation of patient's condition.   ____________________________________________   INITIAL IMPRESSION / ASSESSMENT AND PLAN / ED COURSE  Pertinent labs & imaging results that were available during my care of the patient were reviewed by  me and considered in my medical decision making (see chart for details).  Patient with clear evidence of gangrenous changes on clinical exam, x-ray confirms this. Blood cultures and vancomycin and Zosyn ordered. Patient will require admission for further evaluation  ____________________________________________   FINAL CLINICAL IMPRESSION(S) / ED DIAGNOSES  Final diagnoses:  Gangrene of toe (HCC)     Jene Everyobert Henrine Hayter, MD 07/11/15 1317

## 2015-07-11 NOTE — Progress Notes (Signed)
Wound culture obtain and sent to lab

## 2015-07-11 NOTE — Care Management Note (Signed)
Case Management Note  Patient Details  Name: Jerene PitchBarney L Lippman MRN: [id number] Date of Birth: 1961/06/02  Subjective/Objective:    54yo Mr Sheron NightingaleBarney Swinger was admitted 07/11/15 per left great toe redness with purulent necrosis. Pending Vascular and Podiatry Consults. Hx of gout. PCP=Denies. Pharmacy=[facility]. Resides with his brother Kreg ShropshireWillie Gassen ph: [phone]. No home assistive equipment. No home oxygen. No home health services. Drives self to appointments and to work. Case management will follow for discharge planning.                 Action/Plan:   Expected Discharge Date:                  Expected Discharge Plan:     In-House Referral:     Discharge planning Services     Post Acute Care Choice:    Choice offered to:     DME Arranged:    DME Agency:     HH Arranged:    HH Agency:     Status of Service:     Medicare Important Message Given:    Date Medicare IM Given:    Medicare IM give by:    Date Additional Medicare IM Given:    Additional Medicare Important Message give by:     If discussed at Long Length of Stay Meetings, dates discussed:    Additional Comments:  Harley Mccartney A, RN 07/11/2015, 4:56 PM

## 2015-07-11 NOTE — ED Notes (Signed)
Katie able to obtain blood and blood cultures - now okay to start antibiotics

## 2015-07-11 NOTE — Progress Notes (Signed)
ANTIBIOTIC CONSULT NOTE - INITIAL  Pharmacy Consult for vancomycin Indication: Wound infection  No Known Allergies  Patient Measurements: Height: 6\' 4"  (193 cm) Weight: 240 lb (108.863 kg) IBW/kg (Calculated) : 86.8 Adjusted Body Weight: 95.6 kg  Vital Signs: Temp: 99.1 F (37.3 C) (11/13 1100) BP: 142/77 mmHg (11/13 1400) Pulse Rate: 94 (11/13 1330) Intake/Output from previous day:   Intake/Output from this shift:    Labs:  Recent Labs  07/11/15 1233  WBC 18.6*  HGB 13.1  PLT 419  CREATININE 0.69   Estimated Creatinine Clearance: 139.4 mL/min (by C-G formula based on Cr of 0.69). No results for input(s): VANCOTROUGH, VANCOPEAK, VANCORANDOM, GENTTROUGH, GENTPEAK, GENTRANDOM, TOBRATROUGH, TOBRAPEAK, TOBRARND, AMIKACINPEAK, AMIKACINTROU, AMIKACIN in the last 72 hours.   Microbiology: No results found for this or any previous visit (from the past 720 hour(s)).  Medical History: History reviewed. No pertinent past medical history.  Medications:  Anti-infectives    Start     Dose/Rate Route Frequency Ordered Stop   07/11/15 1900  vancomycin (VANCOCIN) 1,250 mg in sodium chloride 0.9 % 250 mL IVPB     1,250 mg 166.7 mL/hr over 90 Minutes Intravenous Every 12 hours 07/11/15 1428     07/11/15 1400  piperacillin-tazobactam (ZOSYN) IVPB 3.375 g     3.375 g 12.5 mL/hr over 240 Minutes Intravenous 3 times per day 07/11/15 1332     07/11/15 1115  vancomycin (VANCOCIN) IVPB 1000 mg/200 mL premix     1,000 mg 200 mL/hr over 60 Minutes Intravenous  Once 07/11/15 1112 07/11/15 1411   07/11/15 1115  piperacillin-tazobactam (ZOSYN) IVPB 3.375 g     3.375 g 12.5 mL/hr over 240 Minutes Intravenous  Once 07/11/15 1112       Assessment: Pharmacy consulted to dose vancomycin in this 54 year old male presenting with a red, swollen toe with purulence and necrosis.   Kinetics: Adjusted body weight = 95.6 kg   Adjusted CrCl 100 mL/min ke = 0.087 Half-life: 8 hours Vd = 66.7L Cmin  (calculated) = ~13 mcg/mL  Goal of Therapy:  Vancomycin trough level 15-20 mcg/ml  Plan:  Measure antibiotic drug levels at steady state Follow up culture results   Pt received 1g vancomycin in ED  Will start 1250 mg IV q12h with stacked dose starting 6 hours after initial dose Calculated Cmin is ~13 mcg/mL but patient at risk for accumulation due to obesity.  Vanc trough ordered for 11/15 prior to 5th dose  Pharmacy will monitor  Jodelle RedMary M Selda Jalbert 07/11/2015,2:28 PM

## 2015-07-11 NOTE — ED Notes (Signed)
Presents with swelling to left foot and left for several days   "thinks he has gout"  States pain is mainly to left great toe and his skin is split

## 2015-07-11 NOTE — Progress Notes (Signed)
Pt arrived on unit, Left great toe is swollen, red and malodorous. Podiatry consult and vascular consult called.

## 2015-07-12 ENCOUNTER — Encounter: Payer: Self-pay | Admitting: Anesthesiology

## 2015-07-12 ENCOUNTER — Inpatient Hospital Stay: Payer: BLUE CROSS/BLUE SHIELD | Admitting: Anesthesiology

## 2015-07-12 ENCOUNTER — Encounter
Admission: EM | Disposition: A | Discharge status: Home / Self Care | Payer: Self-pay | Source: Home / Self Care | Attending: Internal Medicine

## 2015-07-12 ENCOUNTER — Ambulatory Visit: Admit: 2015-07-12 | Payer: Self-pay | Admitting: Podiatry

## 2015-07-12 HISTORY — PX: AMPUTATION TOE: SHX6595

## 2015-07-12 HISTORY — PX: IRRIGATION AND DEBRIDEMENT FOOT: SHX6602

## 2015-07-12 LAB — FERRITIN: FERRITIN: 390 ng/mL — AB (ref 24–336)

## 2015-07-12 LAB — CBC
HEMATOCRIT: 32.8 % — AB (ref 40.0–52.0)
HEMOGLOBIN: 11.3 g/dL — AB (ref 13.0–18.0)
MCH: 34.4 pg — AB (ref 26.0–34.0)
MCHC: 34.6 g/dL (ref 32.0–36.0)
MCV: 99.2 fL (ref 80.0–100.0)
Platelets: 404 10*3/uL (ref 150–440)
RBC: 3.3 MIL/uL — ABNORMAL LOW (ref 4.40–5.90)
RDW: 12 % (ref 11.5–14.5)
WBC: 13.1 10*3/uL — ABNORMAL HIGH (ref 3.8–10.6)

## 2015-07-12 LAB — COMPREHENSIVE METABOLIC PANEL
ALBUMIN: 2.6 g/dL — AB (ref 3.5–5.0)
ALK PHOS: 66 U/L (ref 38–126)
ALT: 22 U/L (ref 17–63)
ANION GAP: 8 (ref 5–15)
AST: 13 U/L — ABNORMAL LOW (ref 15–41)
BILIRUBIN TOTAL: 0.9 mg/dL (ref 0.3–1.2)
BUN: 8 mg/dL (ref 6–20)
CALCIUM: 8.4 mg/dL — AB (ref 8.9–10.3)
CHLORIDE: 101 mmol/L (ref 101–111)
CO2: 29 mmol/L (ref 22–32)
Creatinine, Ser: 0.77 mg/dL (ref 0.61–1.24)
GFR calc Af Amer: >60 mL/min (ref >60)
GLUCOSE: 110 mg/dL — AB (ref 65–99)
Potassium: 3.7 mmol/L (ref 3.5–5.1)
Sodium: 138 mmol/L (ref 135–145)
Total Protein: 6.1 g/dL — ABNORMAL LOW (ref 6.5–8.1)

## 2015-07-12 LAB — IRON AND TIBC
IRON: 43 ug/dL — AB (ref 45–182)
SATURATION RATIOS: 23 % (ref 17.9–39.5)
TIBC: 187 ug/dL — AB (ref 250–450)
UIBC: 145 ug/dL

## 2015-07-12 SURGERY — IRRIGATION AND DEBRIDEMENT FOOT
Anesthesia: General | Laterality: Left

## 2015-07-12 MED ORDER — LIDOCAINE HCL (CARDIAC) 20 MG/ML IV SOLN
INTRAVENOUS | Status: DC | PRN
  Administered 2015-07-12: 100 mg via INTRAVENOUS

## 2015-07-12 MED ORDER — PROPOFOL 10 MG/ML IV BOLUS
INTRAVENOUS | Status: DC | PRN
  Administered 2015-07-12: 200 mg via INTRAVENOUS

## 2015-07-12 MED ORDER — GLYCOPYRROLATE 0.2 MG/ML IJ SOLN
INTRAMUSCULAR | Status: DC | PRN
  Administered 2015-07-12: 0.2 mg via INTRAVENOUS

## 2015-07-12 MED ORDER — MIDAZOLAM HCL 2 MG/2ML IJ SOLN
INTRAMUSCULAR | Status: DC | PRN
  Administered 2015-07-12: 2 mg via INTRAVENOUS

## 2015-07-12 MED ORDER — NICOTINE POLACRILEX 2 MG MT GUM
2.0000 mg | CHEWING_GUM | OROMUCOSAL | Status: DC | PRN
  Filled 2015-07-12: qty 1

## 2015-07-12 MED ORDER — FENTANYL CITRATE (PF) 100 MCG/2ML IJ SOLN: 25.0000 ug | INTRAMUSCULAR | Status: DC | PRN

## 2015-07-12 MED ORDER — BUPIVACAINE HCL 0.5 % IJ SOLN
INTRAMUSCULAR | Status: DC | PRN
  Administered 2015-07-12: 10 mL

## 2015-07-12 MED ORDER — ONDANSETRON HCL 4 MG/2ML IJ SOLN
INTRAMUSCULAR | Status: DC | PRN
  Administered 2015-07-12: 4 mg via INTRAVENOUS

## 2015-07-12 MED ORDER — MORPHINE SULFATE (PF) 2 MG/ML IV SOLN: 2.0000 mg | INTRAVENOUS | Status: DC | PRN

## 2015-07-12 MED ORDER — LIDOCAINE HCL 1 % IJ SOLN
INTRAMUSCULAR | Status: DC | PRN
  Administered 2015-07-12: 10 mL

## 2015-07-12 MED ORDER — DEXAMETHASONE SODIUM PHOSPHATE 4 MG/ML IJ SOLN
INTRAMUSCULAR | Status: DC | PRN
  Administered 2015-07-12: 5 mg via INTRAVENOUS

## 2015-07-12 MED ORDER — LACTATED RINGERS IV SOLN
INTRAVENOUS | Status: DC | PRN
  Administered 2015-07-12: 18:00:00 via INTRAVENOUS

## 2015-07-12 MED ORDER — OXYCODONE HCL 5 MG/5ML PO SOLN: 5.0000 mg | Freq: Once | ORAL | Status: DC | PRN

## 2015-07-12 MED ORDER — FENTANYL CITRATE (PF) 100 MCG/2ML IJ SOLN
INTRAMUSCULAR | Status: DC | PRN
  Administered 2015-07-12 (×2): 50 ug via INTRAVENOUS

## 2015-07-12 MED ORDER — OXYCODONE HCL 5 MG PO TABS: 5.0000 mg | ORAL_TABLET | Freq: Once | ORAL | Status: DC | PRN

## 2015-07-12 SURGICAL SUPPLY — 63 items
BANDAGE ACE 4X5 VEL STRL LF (GAUZE/BANDAGES/DRESSINGS) ×2 IMPLANT
BANDAGE ELASTIC 4 LF NS (GAUZE/BANDAGES/DRESSINGS) ×2 IMPLANT
BANDAGE STRETCH 3X4.1 STRL (GAUZE/BANDAGES/DRESSINGS) ×4 IMPLANT
BLADE OSC/SAGITTAL MD 5.5X18 (BLADE) ×2 IMPLANT
BLADE OSCILLATING/SAGITTAL (BLADE) ×1
BLADE SURG MINI STRL (BLADE) ×2 IMPLANT
BLADE SW THK.38XMED LNG THN (BLADE) ×1 IMPLANT
BNDG COHESIVE 4X5 TAN STRL (GAUZE/BANDAGES/DRESSINGS) ×2 IMPLANT
BNDG COHESIVE 6X5 TAN STRL LF (GAUZE/BANDAGES/DRESSINGS) ×2 IMPLANT
BNDG ESMARK 4X12 TAN STRL LF (GAUZE/BANDAGES/DRESSINGS) ×2 IMPLANT
BNDG GAUZE 4.5X4.1 6PLY STRL (MISCELLANEOUS) ×2 IMPLANT
CANISTER SUCT 1200ML W/VALVE (MISCELLANEOUS) ×2 IMPLANT
CANISTER SUCT 3000ML (MISCELLANEOUS) ×2 IMPLANT
CUFF TOURN 18 STER (MISCELLANEOUS) ×2 IMPLANT
CUFF TOURN DUAL PL 12 NO SLV (MISCELLANEOUS) ×2 IMPLANT
DRAPE FLUOR MINI C-ARM 54X84 (DRAPES) ×2 IMPLANT
DRAPE XRAY CASSETTE 23X24 (DRAPES) ×2 IMPLANT
DURAPREP 26ML APPLICATOR (WOUND CARE) ×2 IMPLANT
GAUZE IODOFORM PACK 1/2 7832 (GAUZE/BANDAGES/DRESSINGS) ×2 IMPLANT
GAUZE PACKING 1/4X5YD (GAUZE/BANDAGES/DRESSINGS) ×2 IMPLANT
GAUZE PACKING IODOFORM 1X5 (MISCELLANEOUS) ×2 IMPLANT
GAUZE PETRO XEROFOAM 1X8 (MISCELLANEOUS) ×2 IMPLANT
GAUZE SPONGE 4X4 12PLY STRL (GAUZE/BANDAGES/DRESSINGS) ×2 IMPLANT
GAUZE STRETCH 2X75IN STRL (MISCELLANEOUS) ×2 IMPLANT
GLOVE BIO SURGEON STRL SZ7.5 (GLOVE) ×2 IMPLANT
GLOVE INDICATOR 8.0 STRL GRN (GLOVE) ×2 IMPLANT
GOWN STRL REUS W/ TWL LRG LVL3 (GOWN DISPOSABLE) ×2 IMPLANT
GOWN STRL REUS W/TWL LRG LVL3 (GOWN DISPOSABLE) ×2
GOWN STRL REUS W/TWL MED LVL3 (GOWN DISPOSABLE) ×4 IMPLANT
HANDPIECE SUCTION TUBG SURGILV (MISCELLANEOUS) ×2 IMPLANT
HANDPIECE VERSAJET DEBRIDEMENT (MISCELLANEOUS) ×2 IMPLANT
IV NS 1000ML (IV SOLUTION) ×1
IV NS 1000ML BAXH (IV SOLUTION) ×1 IMPLANT
KIT RM TURNOVER STRD PROC AR (KITS) ×2 IMPLANT
LABEL OR SOLS (LABEL) ×2 IMPLANT
NEEDLE FILTER BLUNT 18X 1/2SAF (NEEDLE) ×1
NEEDLE FILTER BLUNT 18X1 1/2 (NEEDLE) ×1 IMPLANT
NEEDLE HYPO 25X1 1.5 SAFETY (NEEDLE) ×2 IMPLANT
NS IRRIG 500ML POUR BTL (IV SOLUTION) ×2 IMPLANT
PACK EXTREMITY ARMC (MISCELLANEOUS) ×2 IMPLANT
PAD ABD DERMACEA PRESS 5X9 (GAUZE/BANDAGES/DRESSINGS) ×4 IMPLANT
PAD GROUND ADULT SPLIT (MISCELLANEOUS) ×2 IMPLANT
RASP SM TEAR CROSS CUT (RASP) ×2 IMPLANT
SOL .9 NS 3000ML IRR  AL (IV SOLUTION) ×1
SOL .9 NS 3000ML IRR UROMATIC (IV SOLUTION) ×1 IMPLANT
SOL PREP PVP 2OZ (MISCELLANEOUS) ×2
SOLUTION PREP PVP 2OZ (MISCELLANEOUS) ×1 IMPLANT
STOCKINETTE IMPERVIOUS 9X36 MD (GAUZE/BANDAGES/DRESSINGS) ×2 IMPLANT
STOCKINETTE M/LG 89821 (MISCELLANEOUS) ×2 IMPLANT
STRAP SAFETY BODY (MISCELLANEOUS) ×2 IMPLANT
SUT ETHILON 2 0 FS 18 (SUTURE) ×4 IMPLANT
SUT ETHILON 3-0 FS-10 30 BLK (SUTURE) ×2
SUT ETHILON 4-0 (SUTURE) ×1
SUT ETHILON 4-0 FS2 18XMFL BLK (SUTURE) ×1
SUT ETHILON 5-0 FS-2 18 BLK (SUTURE) ×2 IMPLANT
SUT VIC AB 3-0 SH 27 (SUTURE) ×1
SUT VIC AB 3-0 SH 27X BRD (SUTURE) ×1 IMPLANT
SUT VIC AB 4-0 FS2 27 (SUTURE) ×2 IMPLANT
SUTURE EHLN 3-0 FS-10 30 BLK (SUTURE) ×1 IMPLANT
SUTURE ETHLN 4-0 FS2 18XMF BLK (SUTURE) ×1 IMPLANT
SWAB CULTURE AMIES ANAERIB BLU (MISCELLANEOUS) ×2 IMPLANT
SYR 3ML LL SCALE MARK (SYRINGE) ×2 IMPLANT
SYRINGE 10CC LL (SYRINGE) ×6 IMPLANT

## 2015-07-12 NOTE — Anesthesia Procedure Notes (Signed)
Procedure Name: LMA Insertion Date/Time: 07/12/2015 5:42 PM Performed by: Stormy FabianURTIS, Julius Matus Pre-anesthesia Checklist: Patient identified, Patient being monitored, Timeout performed, Emergency Drugs available and Suction available Patient Re-evaluated:Patient Re-evaluated prior to inductionOxygen Delivery Method: Circle system utilized Preoxygenation: Pre-oxygenation with 100% oxygen Intubation Type: IV induction Ventilation: Mask ventilation without difficulty LMA: LMA inserted LMA Size: 5.0 Tube type: Oral Number of attempts: 1 Placement Confirmation: positive ETCO2 and breath sounds checked- equal and bilateral Tube secured with: Tape Dental Injury: Teeth and Oropharynx as per pre-operative assessment

## 2015-07-12 NOTE — Progress Notes (Signed)
[facility] [place] [facility]   PATIENT NAME: [name] [name]    MR#:  [id number]  DATE OF BIRTH:  09/08/1960  SUBJECTIVE:  CHIEF COMPLAINT:   Chief Complaint  Patient presents with  . Foot Pain   patient is a 54 year old male with past medical history significant for history of drug abuse who presents to the hospital with complaints of left great toe pain and swelling. In emergency room, he was noted to have purulence or necrosis at left great toe area. His white blood cell count was also found to be elevated. Patient was seen by podiatrist and recommended surgery. The patient admits of having some bilateral lower extremity pain, however, comfortable with current pain medications  Review of Systems  Constitutional: Negative for fever, chills and weight loss.  HENT: Negative for congestion.   Eyes: Negative for blurred vision and double vision.  Respiratory: Negative for cough, sputum production, shortness of breath and wheezing.   Cardiovascular: Negative for chest pain, palpitations, orthopnea, leg swelling and PND.  Gastrointestinal: Negative for nausea, vomiting, abdominal pain, diarrhea, constipation and blood in stool.  Genitourinary: Negative for dysuria, urgency, frequency and hematuria.  Musculoskeletal: Negative for falls.  Neurological: Negative for dizziness, tremors, focal weakness and headaches.  Endo/Heme/Allergies: Does not bruise/bleed easily.  Psychiatric/Behavioral: Negative for depression. The patient does not have insomnia.     VITAL SIGNS: Blood pressure 126/82, pulse 87, temperature 98.4 F (36.9 C), temperature source Oral, resp. rate 18, height  (1.93 m), weight 109.062 kg (240 lb 7 oz), SpO2 99 %.  PHYSICAL EXAMINATION:   GENERAL:  54 y.o.-year-old patient lying in the bed with no acute distress.  EYES: Pupils equal, round, reactive to light and accommodation. No scleral icterus. Extraocular muscles intact.  HEENT: Head  atraumatic, normocephalic. Oropharynx and nasopharynx clear.  NECK:  Supple, no jugular venous distention. No thyroid enlargement, no tenderness.  LUNGS: Normal breath sounds bilaterally, no wheezing, rales,rhonchi or crepitation. No use of accessory muscles of respiration.  CARDIOVASCULAR: S1, S2 normal. No murmurs, rubs, or gallops.  ABDOMEN: Soft, nontender, nondistended. Bowel sounds present. No organomegaly or mass.  EXTREMITIES: No pedal edema, cyanosis, or clubbing. Bilateral foot dressing, no drainage was noted. Faul smell NEUROLOGIC: Cranial nerves II through XII are intact. Muscle strength 5/5 in all extremities. Sensation intact. Gait not checked.  PSYCHIATRIC: The patient is alert and oriented x 3.  SKIN: No obvious rash, lesion, or ulcer.   ORDERS/RESULTS REVIEWED:   CBC  Recent Labs Lab 07/11/15 1233 07/12/15 0338  WBC 18.6* 13.1*  HGB 13.1 11.3*  HCT 40.3 32.8*  PLT 419 404  MCV 100.1* 99.2  MCH 32.7 34.4*  MCHC 32.6 34.6  RDW 11.8 12.0  LYMPHSABS 1.1  --   MONOABS 1.8*  --   EOSABS 0.3  --   BASOSABS 0.1  --    ------------------------------------------------------------------------------------------------------------------  Chemistries   Recent Labs Lab 07/11/15 1233 07/12/15 0338  NA 136 138  K 3.3* 3.7  CL 100* 101  CO2 24 29  GLUCOSE 73 110*  BUN 7 8  CREATININE 0.69 0.77  CALCIUM 8.9 8.4*  AST 21 13*  ALT 30 22  ALKPHOS 94 66  BILITOT 0.8 0.9   ------------------------------------------------------------------------------------------------------------------ estimated creatinine clearance is 139.6 mL/min (by C-G formula based on Cr of 0.77). ------------------------------------------------------------------------------------------------------------------ No results for input(s): TSH, T4TOTAL, T3FREE, THYROIDAB in the last 72 hours.  Invalid input(s): FREET3  Cardiac Enzymes No results for input(s): CKMB, TROPONINI, MYOGLOBIN  in the last  168 hours.  Invalid input(s): CK ------------------------------------------------------------------------------------------------------------------ Invalid input(s): POCBNP ---------------------------------------------------------------------------------------------------------------  RADIOLOGY: Dg Foot Complete Left  07/11/2015  CLINICAL DATA:  Gangrenous left great toe for 3 weeks. EXAM: LEFT FOOT - COMPLETE 3+ VIEW COMPARISON:  None. FINDINGS: Left great toe soft tissue emphysema noted compatible with soft tissue infection. No definite underlying osseous destruction, cortical loss, or bone lucency by plain radiography. Normal alignment. No acute fracture. Diffuse left foot soft tissue swelling on the lateral view. IMPRESSION: Left great toe soft tissue emphysema, compatible with infection/gangrene. No definite underlying acute osseous finding or fracture Electronically Signed   By: Judie PetitM.  Shick M.D.   On: 07/11/2015 11:35   Dg Foot Complete Right  07/12/2015  CLINICAL DATA:  Diabetic foot ulcer. EXAM: RIGHT FOOT COMPLETE - 3+ VIEW COMPARISON:  None. FINDINGS: Soft tissue air likely site of ulcer subjacent to the fourth-fifth toe. There are hammertoe deformities of the second through fifth digits. No erosion, periosteal reaction or bony destructive change. No radiopaque foreign body. 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 Small plantar calcaneal spur. IMPRESSION: Probable ulcer base of the fourth- fifth toe. No radiographic findings of osteomyelitis. Electronically Signed   By: Rubye OaksMelanie  Ehinger M.D.   On: 07/12/2015 00:09    EKG:  Orders placed or performed during the hospital encounter of 07/11/15  . ED EKG  . ED EKG    ASSESSMENT AND PLAN:  Active Problems:   SIRS (systemic inflammatory response syndrome) (HCC)   Gangrene (HCC) 1. Sepsis due to left great toe cellulitis and abscess, continue patient on broad-spectrum antibiotic therapy with vancomycin and Zosyn,  wound cultures are pending. Blood cultures are negative so far. MRSA PCR is negative 2. Left great toe as well as right fourth toe infection, abscess and cellulitis, patient is to undergo operative therapy today by Dr. Ether GriffinsFowler, getting infectious disease involved, continue antibiotic therapy 3. Hypokalemia, resolved 4. Leukocytosis, improving with therapy 5. Anemia due to dehydration. Follow with therapy and transfuse patient as needed. Iron studies 6. Tobacco abuse. Counseling. Discussed this patient for approximately 4 minutes. Nicotine replacement therapy is initiated   Management plans discussed with the patient, family and they are in agreement.   DRUG ALLERGIES: No Known Allergies  CODE STATUS:     Code Status Orders        Start     Ordered   07/11/15 1445  Full code   Continuous     07/11/15 1444      TOTAL TIME TAKING CARE OF THIS PATIENT: 40 minutes.    Katharina CaperVAICKUTE,Khaila Velarde M.D on 07/12/2015 at 4:51 PM  Between 7am to 6pm - Pager - [phone]  After 6pm go to [email or web address] - password [email or web address] [facility]  [facility]  Office  [phone]  CC: Primary care physician; No primary care provider on file.

## 2015-07-12 NOTE — H&P (Signed)
  HISTORY AND PHYSICAL INTERVAL NOTE:  07/12/2015  4:58 PM  [name] [name]  has presented today for surgery, with the diagnosis of I&D GREAT TOE and possible toe amputation.  The various methods of treatment have been discussed with the patient.  No guarantees were given.  After consideration of risks, benefits and other options for treatment, the patient has consented to surgery.  I have reviewed the patients' chart and labs.    Patient Vitals for the past 24 hrs:  BP Temp Temp src Pulse Resp SpO2 Weight  07/12/15 1540 126/82 mmHg 98.4 F (36.9 C) Oral 87 18 99 % -  07/12/15 0807 107/69 mmHg 98.5 F (36.9 C) Oral 87 18 99 % -  07/12/15 0500 - - - - - - 109.062 kg (240 lb 7 oz)  07/12/15 0354 125/67 mmHg 99.5 F (37.5 C) Oral 99 18 97 % -  07/11/15 2002 123/74 mmHg (!) 100.9 F (38.3 C) Oral (!) 104 20 97 % -  07/11/15 1724 - 100 F (37.8 C) Oral - - - -    A history and physical examination was performed on the floor.  The patient was reexamined.  There have been no changes to this history and physical examination.  Gwyneth RevelsFowler, Dietrich Samuelson A

## 2015-07-12 NOTE — Anesthesia Preprocedure Evaluation (Signed)
Anesthesia Evaluation  Patient identified by MRN, date of birth, ID band Patient awake    Reviewed: Allergy & Precautions, H&P , NPO status , Patient's Chart, lab work & pertinent test results  History of Anesthesia Complications Negative for: history of anesthetic complications  Airway Mallampati: II  TM Distance: >3 FB Neck ROM: full    Dental  (+) Poor Dentition, Missing, Edentulous Upper, Edentulous Lower   Pulmonary neg shortness of breath, Current Smoker,    Pulmonary exam normal breath sounds clear to auscultation       Cardiovascular Exercise Tolerance: Good (-) angina(-) Past MI and (-) DOE negative cardio ROS Normal cardiovascular exam Rhythm:regular Rate:Normal     Neuro/Psych negative neurological ROS  negative psych ROS   GI/Hepatic negative GI ROS, Neg liver ROS, neg GERD  ,  Endo/Other  negative endocrine ROS  Renal/GU negative Renal ROS  negative genitourinary   Musculoskeletal   Abdominal   Peds  Hematology negative hematology ROS (+)   Anesthesia Other Findings History reviewed. No pertinent past medical history.  History reviewed. No pertinent surgical history.  BMI    Body Mass Index   29.27 kg/m 2    Signs and symptoms suggestive of sleep apnea     Reproductive/Obstetrics negative OB ROS                             Anesthesia Physical Anesthesia Plan  ASA: III  Anesthesia Plan: General LMA   Post-op Pain Management:    Induction:   Airway Management Planned:   Additional Equipment:   Intra-op Plan:   Post-operative Plan:   Informed Consent: I have reviewed the patients History and Physical, chart, labs and discussed the procedure including the risks, benefits and alternatives for the proposed anesthesia with the patient or authorized representative who has indicated his/her understanding and acceptance.   Dental Advisory Given  Plan Discussed  with: Anesthesiologist, CRNA and Surgeon  Anesthesia Plan Comments:         Anesthesia Quick Evaluation

## 2015-07-12 NOTE — Transfer of Care (Signed)
Immediate Anesthesia Transfer of Care Note  Patient: [name] [name]  Procedure(s) Performed: Procedure(s): IRRIGATION AND DEBRIDEMENT FOOT (Left) AMPUTATION TOE (Left)  Patient Location: PACU  Anesthesia Type:General  Level of Consciousness: awake, alert , oriented and patient cooperative  Airway & Oxygen Therapy: Patient Spontanous Breathing and Patient connected to face mask oxygen  Post-op Assessment: Report given to RN and Post -op Vital signs reviewed and stable  Post vital signs: Reviewed and stable  Last Vitals:  Filed Vitals:   07/12/15 1837  BP: 136/96  Pulse: 90  Temp: 38.2 C  Resp: 20    Complications: No apparent anesthesia complications

## 2015-07-12 NOTE — Op Note (Signed)
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 Found to have gas in the soft tissues and severe purulent drainage. Brought to the OR for sedation and drainage and possible amputation of his left great toe.    Procedure:  Patient was brought into the OR and placed on the operating table in thesupine position. After anesthesia was obtained theleft lower extremity was prepped and draped in usual sterile fashion.  Attention was directed to the left great toe where an initial incision was made along the medial aspect where there was a large amount of purulent drainage. At this time it was quite clear there was a large amount of necrotic tissue circumferential around the entire great toe and distal two thirds of the great toe. There was no residual tissue for soft tissue coverage and at this time I elected to perform an amputation at the MTPJ in order to provide for removal of the vast majority of the infected tissue and possible soft tissue coverage. At this time the toe was then disarticulated at the MTPJ. This was sent for pathological examination. A wound culture was performed of the deep acted tissue. A medial skin flap was remaining. This was debrided excisionally with a versa jet down to the joint and normal appearing tissue. The long extensor and long flexor tendon she's were noted and no purulent drainage from the mid arch area was noted. His did not track to the lesser MTPJ's at this time. The head of the first metatarsal was noted to be intact and clean. At this time  can flaps were reapproximated loosely with a 2-0 nylon for flushing with a liter of saline with a bulb syringe. The incision was also packed with iodoform dressing. A bulky sterile dressing was applied.    Patient tolerated the procedure and anesthesia well.  Was transported from the OR to the PACU with all vital signs stable and vascular status intact. We'll be readmitted to the floor. No way to the forefoot region. We'll continue to monitor for further residual infection.

## 2015-07-12 NOTE — Care Management Note (Signed)
Case Management Note  Patient Details  Name: [name] [name] MRN: [id number] Date of Birth: 1961-01-13  Subjective/Objective:     [name] [name] is currently NPO prior to scheduled foot surgery today. Provided insured [name] [name] with a list of local medical clinics where he can establish himself with a physician after this hospitalization,ie. [facility], [facility], [facility]. Encouraged [name] [name] to find a physician and to see him/her regularly to help avoid future surgeries. [name] [name] verbalized understanding.               Action/Plan:   Expected Discharge Date:                  Expected Discharge Plan:     In-House Referral:     Discharge planning Services     Post Acute Care Choice:    Choice offered to:     DME Arranged:    DME Agency:     HH Arranged:    HH Agency:     Status of Service:     Medicare Important Message Given:    Date Medicare IM Given:    Medicare IM give by:    Date Additional Medicare IM Given:    Additional Medicare Important Message give by:     If discussed at Long Length of Stay Meetings, dates discussed:    Additional Comments:  Tima Curet A, RN 07/12/2015, 9:53 AM

## 2015-07-12 NOTE — Anesthesia Postprocedure Evaluation (Signed)
  Anesthesia Post-op Note  Patient: [name] [name]  Procedure(s) Performed: Procedure(s): IRRIGATION AND DEBRIDEMENT FOOT (Left) AMPUTATION TOE (Left)  Anesthesia type:General LMA  Patient location: PACU  Post pain: Pain level controlled  Post assessment: Post-op Vital signs reviewed, Patient's Cardiovascular Status Stable, Respiratory Function Stable, Patent Airway and No signs of Nausea or vomiting  Post vital signs: Reviewed and stable  Last Vitals:  Filed Vitals:   07/12/15 1907  BP: 126/78  Pulse: 87  Temp:   Resp: 19    Level of consciousness: awake, alert  and patient cooperative  Complications: No apparent anesthesia complications

## 2015-07-12 NOTE — Progress Notes (Signed)
Patient a&o, vss, NPO for surgery at 5pm.

## 2015-07-13 ENCOUNTER — Encounter: Payer: Self-pay | Admitting: Podiatry

## 2015-07-13 LAB — CBC
HCT: 34.6 % — ABNORMAL LOW (ref 40.0–52.0)
HEMOGLOBIN: 11.2 g/dL — AB (ref 13.0–18.0)
MCH: 32.6 pg (ref 26.0–34.0)
MCHC: 32.5 g/dL (ref 32.0–36.0)
MCV: 100.4 fL — ABNORMAL HIGH (ref 80.0–100.0)
Platelets: 454 10*3/uL — ABNORMAL HIGH (ref 150–440)
RBC: 3.44 MIL/uL — AB (ref 4.40–5.90)
RDW: 11.8 % (ref 11.5–14.5)
WBC: 12.5 10*3/uL — ABNORMAL HIGH (ref 3.8–10.6)

## 2015-07-13 LAB — IRON AND TIBC
IRON: 48 ug/dL (ref 45–182)
Saturation Ratios: 22 % (ref 17.9–39.5)
TIBC: 214 ug/dL — ABNORMAL LOW (ref 250–450)
UIBC: 166 ug/dL

## 2015-07-13 LAB — VANCOMYCIN, TROUGH: Vancomycin Tr: 9 ug/mL — ABNORMAL LOW (ref 10–20)

## 2015-07-13 LAB — FERRITIN: FERRITIN: 421 ng/mL — AB (ref 24–336)

## 2015-07-13 MED ORDER — VANCOMYCIN HCL 10 G IV SOLR
1500.0000 mg | Freq: Two times a day (BID) | INTRAVENOUS | Status: DC
  Administered 2015-07-13 – 2015-07-14 (×2): 1500 mg via INTRAVENOUS
  Filled 2015-07-13 (×5): qty 1500

## 2015-07-13 NOTE — Progress Notes (Signed)
Daily Progress Note   Subjective  - 1 Day Post-Op  Is full follow-up of amputation left great toe.  Also wanted to evaluate the the right foot ulcerative area on his right 4th toe and plantar right  Objective Filed Vitals:   07/13/15 0030 07/13/15 0453 07/13/15 0718 07/13/15 1203  BP: 110/67 116/75 114/66 106/67  Pulse: 86 65 77 83  Temp: 97.4 F (36.3 C) 97.4 F (36.3 C) 98.3 F (36.8 C) 98.6 F (37 C)  TempSrc: Oral Oral Oral Oral  Resp: 20 20 20 18   Height:      Weight:  109.162 kg (240 lb 10.5 oz)    SpO2: 98% 100% 99% 99%    Physical Exam:   The amputation site skin flap is well perfused.  No necrosis at this time. No purulence.    He has a ulcerative lesion under his right 2nd MTPJ that was noted with debridement of the superficial hyperkeratotic and nonviable tissue.  This is limited to breakdown of the epidermis and dermal layer.  No subcutaneous tissue was noted. No infection on the right foot.  Laboratory CBC    Component Value Date/Time   WBC 12.5* 07/13/2015 0629   WBC 7.1 07/14/2012 1726   HGB 11.2* 07/13/2015 0629   HGB 12.1* 07/14/2012 1726   HCT 34.6* 07/13/2015 0629   HCT 35.9* 07/14/2012 1726   PLT 454* 07/13/2015 0629   PLT 310 07/14/2012 1726    BMET    Component Value Date/Time   NA 138 07/12/2015 0338   NA 140 07/14/2012 1726   K 3.7 07/12/2015 0338   K 3.8 07/14/2012 1726   CL 101 07/12/2015 0338   CL 106 07/14/2012 1726   CO2 29 07/12/2015 0338   CO2 26 07/14/2012 1726   GLUCOSE 110* 07/12/2015 0338   GLUCOSE 82 07/14/2012 1726   BUN 8 07/12/2015 0338   BUN 9 07/14/2012 1726   CREATININE 0.77 07/12/2015 0338   CREATININE 0.80 07/14/2012 1726   CALCIUM 8.4* 07/12/2015 0338   CALCIUM 9.3 07/14/2012 1726   GFRNONAA >60 07/12/2015 0338   GFRNONAA >60 07/14/2012 1726   GFRAA >60 07/12/2015 0338   GFRAA >60 07/14/2012 1726    Assessment/Planning:   Gas gangrene to left great toe status post MTPJ amputation of the left great  toe   superficial ulcer plantar right 2nd MTPJ with superficial ulcer right 4th toe PIPJ    dressing was changed on the left foot.  Will continue to monitor this.  If this continues to improve can likely discharge in the next 1-2 days after medically stable.    I excisionally debrided the right 2nd MTPJ plantar ulceration with a 15 blade.  This measured 1 cm x 1/2 cm.  A bandage was applied.    Will have a dressing change performed tomorrow and if looks good can discharge from Podiatry standpoint.    Gwyneth RevelsFowler, Shadawn Hanaway A  07/13/2015, 1:42 PM

## 2015-07-13 NOTE — Progress Notes (Signed)
[facility] [place] [facility]   PATIENT NAME: [name] NightingaleBarney [name]    MR#:  [id number]  DATE OF BIRTH:  Jul 30, 1961  SUBJECTIVE:  CHIEF COMPLAINT:   Chief Complaint  Patient presents with  . Foot Pain   patient is a 54 year old male with past medical history significant for history of drug abuse who presents to the hospital with complaints of left great toe pain and swelling. In emergency room, he was noted to have purulence or necrosis at left great toe area. His white blood cell count was also found to be elevated. Patient was seen by podiatrist and recommended left great toe amputation, performed 14th of November 2016. Feels better today, denies any significant pain. Afebrile. Now on vancomycin and Zosyn. Blood cultures are negative so far. Wound cultures are pending. Many gram-positive cocci as well as gram-negative rods were noted.     Review of Systems  Constitutional: Negative for fever, chills and weight loss.  HENT: Negative for congestion.   Eyes: Negative for blurred vision and double vision.  Respiratory: Negative for cough, sputum production, shortness of breath and wheezing.   Cardiovascular: Negative for chest pain, palpitations, orthopnea, leg swelling and PND.  Gastrointestinal: Negative for nausea, vomiting, abdominal pain, diarrhea, constipation and blood in stool.  Genitourinary: Negative for dysuria, urgency, frequency and hematuria.  Musculoskeletal: Negative for falls.  Neurological: Negative for dizziness, tremors, focal weakness and headaches.  Endo/Heme/Allergies: Does not bruise/bleed easily.  Psychiatric/Behavioral: Negative for depression. The patient does not have insomnia.     VITAL SIGNS: Blood pressure 106/67, pulse 83, temperature 98.6 F (37 C), temperature source Oral, resp. rate 18, height 6\' 4"  (1.93 m), weight 109.162 kg (240 lb 10.5 oz), SpO2 99 %.  PHYSICAL EXAMINATION:   GENERAL:  54 y.o.-year-old patient lying in the  bed with no acute distress.  EYES: Pupils equal, round, reactive to light and accommodation. No scleral icterus. Extraocular muscles intact.  HEENT: Head atraumatic, normocephalic. Oropharynx and nasopharynx clear.  NECK:  Supple, no jugular venous distention. No thyroid enlargement, no tenderness.  LUNGS: Normal breath sounds bilaterally, no wheezing, rales,rhonchi or crepitation. No use of accessory muscles of respiration.  CARDIOVASCULAR: S1, S2 normal. No murmurs, rubs, or gallops.  ABDOMEN: Soft, nontender, nondistended. Bowel sounds present. No organomegaly or mass.  EXTREMITIES: No pedal edema, cyanosis, or clubbing. Bilateral feet Dressing, no bleeding, no swelling or pretibial area .  NEUROLOGIC: Cranial nerves II through XII are intact. Muscle strength 5/5 in all extremities. Sensation intact. Gait not checked.  PSYCHIATRIC: The patient is alert and oriented x 3.  SKIN: No obvious rash, lesion, or ulcer.   ORDERS/RESULTS REVIEWED:   CBC  Recent Labs Lab 07/11/15 1233 07/12/15 0338 07/13/15 0629  WBC 18.6* 13.1* 12.5*  HGB 13.1 11.3* 11.2*  HCT 40.3 32.8* 34.6*  PLT 419 404 454*  MCV 100.1* 99.2 100.4*  MCH 32.7 34.4* 32.6  MCHC 32.6 34.6 32.5  RDW 11.8 12.0 11.8  LYMPHSABS 1.1  --   --   MONOABS 1.8*  --   --   EOSABS 0.3  --   --   BASOSABS 0.1  --   --    ------------------------------------------------------------------------------------------------------------------  Chemistries   Recent Labs Lab 07/11/15 1233 07/12/15 0338  NA 136 138  K 3.3* 3.7  CL 100* 101  CO2 24 29  GLUCOSE 73 110*  BUN 7 8  CREATININE 0.69 0.77  CALCIUM 8.9 8.4*  AST 21 13*  ALT 30 22  ALKPHOS 94 66  BILITOT 0.8 0.9   ------------------------------------------------------------------------------------------------------------------ estimated creatinine clearance is 139.7 mL/min (by C-G formula based on Cr of  0.77). ------------------------------------------------------------------------------------------------------------------ No results for input(s): TSH, T4TOTAL, T3FREE, THYROIDAB in the last 72 hours.  Invalid input(s): FREET3  Cardiac Enzymes No results for input(s): CKMB, TROPONINI, MYOGLOBIN in the last 168 hours.  Invalid input(s): CK ------------------------------------------------------------------------------------------------------------------ Invalid input(s): POCBNP ---------------------------------------------------------------------------------------------------------------  RADIOLOGY: Dg Foot Complete Right  07/12/2015  CLINICAL DATA:  Diabetic foot ulcer. EXAM: RIGHT FOOT COMPLETE - 3+ VIEW COMPARISON:  None. FINDINGS: Soft tissue air likely site of ulcer subjacent to the fourth-fifth toe. There are hammertoe deformities of the second through fifth digits. No erosion, periosteal reaction or bony destructive change. No radiopaque foreign body. 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 Small plantar calcaneal spur. IMPRESSION: Probable ulcer base of the fourth- fifth toe. No radiographic findings of osteomyelitis. Electronically Signed   By: Rubye Oaks M.D.   On: 07/12/2015 00:09    EKG:  Orders placed or performed during the hospital encounter of 07/11/15  . ED EKG  . ED EKG    ASSESSMENT AND PLAN:  Active Problems:   SIRS (systemic inflammatory response syndrome) (HCC)   Gangrene (HCC) 1. Sepsis due to left great toe gas gangrene , status post left great toe amputation at MPJ, continue patient on broad-spectrum antibiotic therapy with vancomycin and Zosyn, wound cultures are pending. Blood cultures are negative so far. MRSA PCR is negative. Get ID involved for further recommendations .  2. Left great toe  gas gangrene as well as right second MTPJ ulceration, status post debridement, continue antibiotic therapy and wound care per podiatry  3.  Hypokalemia, resolved 4. Leukocytosis, improving with therapy 5. Anemia due to rehydration., stable with therapy and transfuse patient as needed. Iron studies  6. Tobacco  abuse , continue nicotine replacement therapy   Management plans discussed with the patient, family and they are in agreement.   DRUG ALLERGIES: No Known Allergies  CODE STATUS:     Code Status Orders        Start     Ordered   07/11/15 1445  Full code   Continuous     07/11/15 1444      TOTAL TIME TAKING CARE OF Tpatient 35 minutes Kacee Sukhu M.D on 07/13/2015 at 2:56 PM  Between 7am to 6pm - Pager - [phone]  After 6pm go to [email or web address] - password [email or web address] [facility]  [facility]  Office  [phone]  CC: Primary care physician; No primary care provider on file.

## 2015-07-13 NOTE — Progress Notes (Signed)
ANTIBIOTIC CONSULT NOTE - FOLLOW UP  Pharmacy Consult for Vancomycin/Zosyn Indication: Wound Infection of foot  No Known Allergies  Patient Measurements: Height: 6\' 4"  (193 cm) Weight: 240 lb 10.5 oz (109.162 kg) IBW/kg (Calculated) : 86.8 Adjusted Body Weight: na   Vital Signs: Temp: 98.6 F (37 C) (11/15 1203) Temp Source: Oral (11/15 1203) BP: 106/67 mmHg (11/15 1203) Pulse Rate: 83 (11/15 1203) Intake/Output from previous day: 11/14 0701 - 11/15 0700 In: 3548.7 [P.O.:237; I.V.:3311.7] Out: 3500 [Urine:3500] Intake/Output from this shift: Total I/O In: 918.3 [P.O.:360; I.V.:558.3] Out: 650 [Urine:650]  Labs:  Recent Labs  07/11/15 1233 07/12/15 0338 07/13/15 0629  WBC 18.6* 13.1* 12.5*  HGB 13.1 11.3* 11.2*  PLT 419 404 454*  CREATININE 0.69 0.77  --    Estimated Creatinine Clearance: 139.7 mL/min (by C-G formula based on Cr of 0.77).  Recent Labs  07/13/15 0630  VANCOTROUGH 9*     Microbiology: Recent Results (from the past 720 hour(s))  Blood culture (routine x 2)     Status: None (Preliminary result)   Collection Time: 07/11/15 12:33 PM  Result Value Ref Range Status   Specimen Description BLOOD RIGHT FOREARM  Final   Special Requests BOTTLES DRAWN AEROBIC AND ANAEROBIC 1 CC  Final   Culture NO GROWTH 2 DAYS  Final   Report Status PENDING  Incomplete  Blood culture (routine x 2)     Status: None (Preliminary result)   Collection Time: 07/11/15 12:33 PM  Result Value Ref Range Status   Specimen Description BLOOD LEFT ASSIST CONTROL  Final   Special Requests BOTTLES DRAWN AEROBIC AND ANAEROBIC 1 CC  Final   Culture NO GROWTH 2 DAYS  Final   Report Status PENDING  Incomplete  Surgical pcr screen     Status: None   Collection Time: 07/11/15  3:04 PM  Result Value Ref Range Status   MRSA, PCR NEGATIVE NEGATIVE Final   Staphylococcus aureus NEGATIVE NEGATIVE Final    Comment:        The Xpert SA Assay (FDA approved for NASAL specimens in  patients over 56 years of age), is one component of a comprehensive surveillance program.  Test performance has been validated by [facility] for patients greater than or equal to 56 year old. It is not intended to diagnose infection nor to guide or monitor treatment.   Wound culture     Status: None (Preliminary result)   Collection Time: 07/11/15 11:30 PM  Result Value Ref Range Status   Specimen Description ABSCESS  Final   Special Requests NONE  Final   Gram Stain PENDING  Incomplete   Culture NORMAL SKIN FLORA  Final   Report Status PENDING  Incomplete  Wound culture     Status: None (Preliminary result)   Collection Time: 07/12/15 10:37 AM  Result Value Ref Range Status   Specimen Description WOUND  Final   Special Requests NONE  Final   Gram Stain   Final    MODERATE WBC SEEN MANY GRAM POSITIVE COCCI IN PAIRS IN CLUSTERS FEW GRAM NEGATIVE RODS    Culture TOO YOUNG TO READ  Final   Report Status PENDING  Incomplete    Anti-infectives    Start     Dose/Rate Route Frequency Ordered Stop   07/13/15 1800  vancomycin (VANCOCIN) 1,500 mg in sodium chloride 0.9 % 500 mL IVPB     1,500 mg 250 mL/hr over 120 Minutes Intravenous Every 12 hours 07/13/15 1454  07/11/15 1900  vancomycin (VANCOCIN) 1,250 mg in sodium chloride 0.9 % 250 mL IVPB  Status:  Discontinued     1,250 mg 166.7 mL/hr over 90 Minutes Intravenous Every 12 hours 07/11/15 1428 07/13/15 1454   07/11/15 1400  piperacillin-tazobactam (ZOSYN) IVPB 3.375 g     3.375 g 12.5 mL/hr over 240 Minutes Intravenous 3 times per day 07/11/15 1332     07/11/15 1115  vancomycin (VANCOCIN) IVPB 1000 mg/200 mL premix     1,000 mg 200 mL/hr over 60 Minutes Intravenous  Once 07/11/15 1112 07/11/15 1411   07/11/15 1115  piperacillin-tazobactam (ZOSYN) IVPB 3.375 g     3.375 g 12.5 mL/hr over 240 Minutes Intravenous  Once 07/11/15 1112 07/11/15 1812      Assessment: Patient currently ordered Vancomycin  IV q12h and  Zosyn 3.375g IV q8h EI for wound infection of foot. Vancomycin trough resulted at 59mcg/ml today.  Goal of Therapy:  Vancomycin trough level 15-20 mcg/ml  Plan:  Measure antibiotic drug levels at steady state Follow up culture results Will increase to Vancomycin  IV q12h. Will check truogh level prior to 5th dose. Will continue with current dosing of Zosyn.  Clovia Cuff, PharmD, BCPS 07/13/2015 2:56 PM

## 2015-07-13 NOTE — Progress Notes (Signed)
Alert and oriented. Medicated for pain with relief. dsg to left foot dry and intact. IV antibiotics infused with no adverse reaction. Adequate urine output.

## 2015-07-13 NOTE — Care Management Note (Addendum)
Case Management Note  Patient Details  Name: [name] [name] MRN: [id number] Date of Birth: 1961/02/12  Subjective/Objective:                  Met with patient who is POD 1 after toe amputation. He states that he works 1st shift at [employer] and "has plenty vacation days and not concerned about job loss". I explained FMLA through his human resources department but he is not concerned. He does not have a PCP. He would like to establish with his sister's PCP at [facility], [place], [place] [address] Phone: [phone]. Appointment made and placed on discharge form for 08/26/15 at 0800AM. He wanted an afternoon appointment but that was not available. He states he is not on any home Rx but if he needs meds he gets them from [facility]. He does not feel that he will need home health or DME at discharge.  Action/Plan: RNCM will continue to follow.   Expected Discharge Date:                  Expected Discharge Plan:     In-House Referral:     Discharge planning Services  CM Consult  Post Acute Care Choice:  Durable Medical Equipment, Home Health Choice offered to:  Patient  DME Arranged:    DME Agency:     HH Arranged:    [place] Agency:     Status of Service:  In process, will continue to follow  Medicare Important Message Given:    Date Medicare IM Given:    Medicare IM give by:    Date Additional Medicare IM Given:    Additional Medicare Important Message give by:     If discussed at [place] of Stay Meetings, dates discussed:    Additional Comments:  Marshell Garfinkel, RN 07/13/2015, 11:41 AM

## 2015-07-13 NOTE — Consult Note (Signed)
[facility]ious [facility]     Reason for Consult:Toe gangrene  Referring Physician: Ether Griffins Date of Admission:  07/11/2015   Active Problems:   SIRS (systemic inflammatory response syndrome) (HCC)   Gangrene (HCC)   HPI: [name] [name] is a 54 y.o. male with hx of tobacco abuse admitted with several weeks of pain in L great toe.  He denies injury , denies hx of DM.  On admission he was found to have gangrenous toe and underwent amputation on 11/14 with finding of necrotic tissue. Residual bone appeared healthy. Cx is pending.  Clinically improving with current treatment with vanco and zosyn. Pt was seen by vascular who felt with intact pulses no further eval was needed.   History reviewed. No pertinent past medical history. Past Surgical History  Procedure Laterality Date  . Irrigation and debridement foot Left 07/12/2015    Procedure: IRRIGATION AND DEBRIDEMENT FOOT;  Surgeon: Samara Deist, DPM;  Location: [facility] ORS;  Service: Podiatry;  Laterality: Left;  . Amputation toe Left 07/12/2015    Procedure: AMPUTATION TOE;  Surgeon: Samara Deist, DPM;  Location: [facility] ORS;  Service: Podiatry;  Laterality: Left;   Social History  Substance Use Topics  . Smoking status: Current Every Day Smoker -- 0.50 packs/day    Types: Cigarettes  . Smokeless tobacco: None  . Alcohol Use: Yes   History reviewed. No pertinent family history.  Allergies: No Known Allergies  Current antibiotics: Antibiotics Given (last 72 hours)    Date/Time Action Medication Dose Rate   07/11/15 2100 Given   vancomycin (VANCOCIN) 1,250 mg in sodium chloride 0.9 % 250 mL IVPB 1,250 mg 166.7 mL/hr   07/11/15 2107 Given   piperacillin-tazobactam (ZOSYN) IVPB 3.375 g 3.375 g 12.5 mL/hr   07/12/15 0548 Given   piperacillin-tazobactam (ZOSYN) IVPB 3.375 g 3.375 g 12.5 mL/hr   07/12/15 0548 Given   vancomycin (VANCOCIN) 1,250 mg in sodium chloride 0.9 % 250 mL IVPB 1,250 mg 166.7 mL/hr   07/12/15 1305 Given    piperacillin-tazobactam (ZOSYN) IVPB 3.375 g 3.375 g 12.5 mL/hr   07/12/15 2044 Given   piperacillin-tazobactam (ZOSYN) IVPB 3.375 g 3.375 g 12.5 mL/hr   07/12/15 2044 Given   vancomycin (VANCOCIN) 1,250 mg in sodium chloride 0.9 % 250 mL IVPB 1,250 mg 166.7 mL/hr   07/13/15 9379 Given   piperacillin-tazobactam (ZOSYN) IVPB 3.375 g 3.375 g 12.5 mL/hr   07/13/15 0240 Given   vancomycin (VANCOCIN) 1,250 mg in sodium chloride 0.9 % 250 mL IVPB 1,250 mg 166.7 mL/hr   07/13/15 1401 Given   piperacillin-tazobactam (ZOSYN) IVPB 3.375 g 3.375 g 12.5 mL/hr      MEDICATIONS: . aspirin EC  81 mg Oral Daily  . docusate sodium  100 mg Oral BID  . heparin  5,000 Units Subcutaneous 3 times per day  . pantoprazole  40 mg Oral Daily  . piperacillin-tazobactam (ZOSYN)  IV  3.375 g Intravenous 3 times per day  . vancomycin  1,500 mg Intravenous Q12H    Review of Systems - 11 systems reviewed and negative per HPI   OBJECTIVE: Temp:  [97.4 F (36.3 C)-98.6 F (37 C)] 98.3 F (36.8 C) (11/15 1954) Pulse Rate:  [65-99] 87 (11/15 1954) Resp:  [18-20] 18 (11/15 1954) BP: (106-123)/(59-75) 111/69 mmHg (11/15 1954) SpO2:  [96 %-100 %] 96 % (11/15 1954) Weight:  [109.162 kg (240 lb 10.5 oz)] 109.162 kg (240 lb 10.5 oz) (11/15 0453) Physical Exam  Constitutional: He is oriented to person, place,  and time. He appears well-developed and well-nourished. No distress.  HENT:  Mouth/Throat: Oropharynx is clear and moist. No oropharyngeal exudate.  Cardiovascular: Normal rate, regular rhythm and normal heart sounds. Exam reveals no gallop and no friction rub.  No murmur heard.  Pulmonary/Chest: Effort normal and breath sounds normal. No respiratory distress. He has no wheezes.  Abdominal: Soft. Bowel sounds are normal. He exhibits no distension. There is no tenderness.  Lymphadenopathy:  He has no cervical adenopathy.  Neurological: He is alert and oriented to person, place, and time.  Skin:R leg  wrapped post op  LABS: Results for orders placed or performed during the hospital encounter of 07/11/15 (from the past 48 hour(s))  Wound culture     Status: None (Preliminary result)   Collection Time: 07/11/15 11:30 PM  Result Value Ref Range   Specimen Description ABSCESS    Special Requests NONE    Gram Stain PENDING    Culture NORMAL SKIN FLORA    Report Status PENDING   Comprehensive metabolic panel     Status: Abnormal   Collection Time: 07/12/15  3:38 AM  Result Value Ref Range   Sodium 138 135 - 145 mmol/L   Potassium 3.7 3.5 - 5.1 mmol/L   Chloride 101 101 - 111 mmol/L   CO2 29 22 - 32 mmol/L   Glucose, Bld 110 (H) 65 - 99 mg/dL   BUN 8 6 - 20 mg/dL   Creatinine, Ser 0.77 0.61 - 1.24 mg/dL   Calcium 8.4 (L) 8.9 - 10.3 mg/dL   Total Protein 6.1 (L) 6.5 - 8.1 g/dL   Albumin 2.6 (L) 3.5 - 5.0 g/dL   AST 13 (L) 15 - 41 U/L   ALT 22 17 - 63 U/L   Alkaline Phosphatase 66 38 - 126 U/L   Total Bilirubin 0.9 0.3 - 1.2 mg/dL   GFR calc non Af Amer >60 >60 mL/min   GFR calc Af Amer >60 >60 mL/min    Comment: (NOTE) The eGFR has been calculated using the CKD EPI equation. This calculation has not been validated in all clinical situations. eGFR's persistently <60 mL/min signify possible Chronic Kidney Disease.    Anion gap 8 5 - 15  CBC     Status: Abnormal   Collection Time: 07/12/15  3:38 AM  Result Value Ref Range   WBC 13.1 (H) 3.8 - 10.6 K/uL   RBC 3.30 (L) 4.40 - 5.90 MIL/uL   Hemoglobin 11.3 (L) 13.0 - 18.0 g/dL   HCT 32.8 (L) 40.0 - 52.0 %   MCV 99.2 80.0 - 100.0 fL   MCH 34.4 (H) 26.0 - 34.0 pg   MCHC 34.6 32.0 - 36.0 g/dL   RDW 12.0 11.5 - 14.5 %   Platelets 404 150 - 440 K/uL  Ferritin     Status: Abnormal   Collection Time: 07/12/15  3:38 AM  Result Value Ref Range   Ferritin 390 (H) 24 - 336 ng/mL  Iron and TIBC     Status: Abnormal   Collection Time: 07/12/15  3:38 AM  Result Value Ref Range   Iron 43 (L) 45 - 182 ug/dL   TIBC 187 (L) 250 - 450  ug/dL   Saturation Ratios 23 17.9 - 39.5 %   UIBC 145 ug/dL  Wound culture     Status: None (Preliminary result)   Collection Time: 07/12/15 10:37 AM  Result Value Ref Range   Specimen Description WOUND    Special Requests NONE  Gram Stain      MODERATE WBC SEEN MANY GRAM POSITIVE COCCI IN PAIRS IN CLUSTERS FEW GRAM NEGATIVE RODS    Culture TOO YOUNG TO READ    Report Status PENDING   CBC     Status: Abnormal   Collection Time: 07/13/15  6:29 AM  Result Value Ref Range   WBC 12.5 (H) 3.8 - 10.6 K/uL   RBC 3.44 (L) 4.40 - 5.90 MIL/uL   Hemoglobin 11.2 (L) 13.0 - 18.0 g/dL   HCT 34.6 (L) 40.0 - 52.0 %   MCV 100.4 (H) 80.0 - 100.0 fL   MCH 32.6 26.0 - 34.0 pg   MCHC 32.5 32.0 - 36.0 g/dL   RDW 11.8 11.5 - 14.5 %   Platelets 454 (H) 150 - 440 K/uL  Vancomycin, trough     Status: Abnormal   Collection Time: 07/13/15  6:30 AM  Result Value Ref Range   Vancomycin Tr 9 (L) 10 - 20 ug/mL  Ferritin     Status: Abnormal   Collection Time: 07/13/15  4:25 PM  Result Value Ref Range   Ferritin 421 (H) 24 - 336 ng/mL  Iron and TIBC     Status: Abnormal   Collection Time: 07/13/15  4:25 PM  Result Value Ref Range   Iron 48 45 - 182 ug/dL   TIBC 214 (L) 250 - 450 ug/dL   Saturation Ratios 22 17.9 - 39.5 %   UIBC 166 ug/dL   No components found for: ESR, C REACTIVE PROTEIN MICRO: Recent Results (from the past 720 hour(s))  Blood culture (routine x 2)     Status: None (Preliminary result)   Collection Time: 07/11/15 12:33 PM  Result Value Ref Range Status   Specimen Description BLOOD RIGHT FOREARM  Final   Special Requests BOTTLES DRAWN AEROBIC AND ANAEROBIC 1 CC  Final   Culture NO GROWTH 2 DAYS  Final   Report Status PENDING  Incomplete  Blood culture (routine x 2)     Status: None (Preliminary result)   Collection Time: 07/11/15 12:33 PM  Result Value Ref Range Status   Specimen Description BLOOD LEFT ASSIST CONTROL  Final   Special Requests BOTTLES DRAWN AEROBIC AND  ANAEROBIC 1 CC  Final   Culture NO GROWTH 2 DAYS  Final   Report Status PENDING  Incomplete  Surgical pcr screen     Status: None   Collection Time: 07/11/15  3:04 PM  Result Value Ref Range Status   MRSA, PCR NEGATIVE NEGATIVE Final   Staphylococcus aureus NEGATIVE NEGATIVE Final    Comment:        The Xpert SA Assay (FDA approved for NASAL specimens in patients over 39 years of age), is one component of a comprehensive surveillance program.  Test performance has been validated by [facility] for patients greater than or equal to 65 year old. It is not intended to diagnose infection nor to guide or monitor treatment.   Wound culture     Status: None (Preliminary result)   Collection Time: 07/11/15 11:30 PM  Result Value Ref Range Status   Specimen Description ABSCESS  Final   Special Requests NONE  Final   Gram Stain PENDING  Incomplete   Culture NORMAL SKIN FLORA  Final   Report Status PENDING  Incomplete  Wound culture     Status: None (Preliminary result)   Collection Time: 07/12/15 10:37 AM  Result Value Ref Range Status   Specimen Description WOUND  Final  Special Requests NONE  Final   Gram Stain   Final    MODERATE WBC SEEN MANY GRAM POSITIVE COCCI IN PAIRS IN CLUSTERS FEW GRAM NEGATIVE RODS    Culture TOO YOUNG TO READ  Final   Report Status PENDING  Incomplete    IMAGING: Dg Foot Complete Left  07/11/2015  CLINICAL DATA:  Gangrenous left great toe for 3 weeks. EXAM: LEFT FOOT - COMPLETE 3+ VIEW COMPARISON:  None. FINDINGS: Left great toe soft tissue emphysema noted compatible with soft tissue infection. No definite underlying osseous destruction, cortical loss, or bone lucency by plain radiography. Normal alignment. No acute fracture. Diffuse left foot soft tissue swelling on the lateral view. IMPRESSION: Left great toe soft tissue emphysema, compatible with infection/gangrene. No definite underlying acute osseous finding or fracture Electronically Signed    By: Jerilynn Mages.  Shick M.D.   On: 07/11/2015 11:35   Dg Foot Complete Right  07/12/2015  CLINICAL DATA:  Diabetic foot ulcer. EXAM: RIGHT FOOT COMPLETE - 3+ VIEW COMPARISON:  None. FINDINGS: Soft tissue air likely site of ulcer subjacent to the fourth-fifth toe. There are hammertoe deformities of the second through fifth digits. No erosion, periosteal reaction or bony destructive change. No radiopaque foreign body. 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 Small plantar calcaneal spur. IMPRESSION: Probable ulcer base of the fourth- fifth toe. No radiographic findings of osteomyelitis. Electronically Signed   By: Jeb Levering M.D.   On: 07/12/2015 00:09    Assessment:   [name] [name] is a 54 y.o. male with tobacco abuse admitted with gangrenous toe over several weeks. No obvious injury, PVD or DM. Now s/p amputation 11/14.  Cx pending.  WBC down 18-> 12.    Recommendations Continue current abx IF clinically improved can transition to oral treatment as long as no significant resistance noted on culture Will need likely 2-4 weeks therapy and fu as otpt  Thank you very much for allowing me to participate in the care of this patient. Please call with questions.   Cheral Marker. Ola Spurr, MD

## 2015-07-14 DIAGNOSIS — Z720 Tobacco use: Secondary | ICD-10-CM

## 2015-07-14 DIAGNOSIS — E876 Hypokalemia: Secondary | ICD-10-CM

## 2015-07-14 DIAGNOSIS — D72829 Elevated white blood cell count, unspecified: Secondary | ICD-10-CM

## 2015-07-14 DIAGNOSIS — D638 Anemia in other chronic diseases classified elsewhere: Secondary | ICD-10-CM

## 2015-07-14 LAB — WOUND CULTURE: Culture: NORMAL

## 2015-07-14 LAB — SURGICAL PATHOLOGY

## 2015-07-14 MED ORDER — NICOTINE POLACRILEX 2 MG MT GUM: 2.0000 mg | CHEWING_GUM | OROMUCOSAL | Status: DC | PRN

## 2015-07-14 MED ORDER — AMOXICILLIN-POT CLAVULANATE 875-125 MG PO TABS
1.0000 | ORAL_TABLET | Freq: Two times a day (BID) | ORAL | Status: DC
Start: 2015-07-14 — End: 2015-09-09

## 2015-07-14 MED ORDER — HYDROCODONE-ACETAMINOPHEN 5-325 MG PO TABS: 1.0000 | ORAL_TABLET | ORAL | Status: DC | PRN

## 2015-07-14 MED ORDER — DOCUSATE SODIUM 100 MG PO CAPS: 100.0000 mg | ORAL_CAPSULE | Freq: Two times a day (BID) | ORAL | Status: DC

## 2015-07-14 NOTE — Progress Notes (Signed)
[facility] [place] [facility]   PATIENT NAME: [name] [name]    MR#:  [id number]  DATE OF BIRTH:  1960-12-03  SUBJECTIVE:  CHIEF COMPLAINT:   Chief Complaint  Patient presents with  . Foot Pain   patient is a 54 year old male with past medical history significant for history of drug abuse who presents to the hospital with complaints of left great toe pain and swelling. In emergency room, he was noted to have purulence or necrosis at left great toe area. His white blood cell count was also found to be elevated. Patient was seen by podiatrist and recommended left great toe amputation, performed 14th of November 2016. Feels better today, denies any significant pain. Afebrile. Now on vancomycin and Zosyn. Blood cultures are negative so far. Wound cultures are still pending. Many gram-positive cocci as well as gram-negative rods were noted. Remains on vancomycin and Zosyn. Podiatrist feels that patient is stable to be discharged home as soon as antibiotic therapies known    Review of Systems  Constitutional: Negative for fever, chills and weight loss.  HENT: Negative for congestion.   Eyes: Negative for blurred vision and double vision.  Respiratory: Negative for cough, sputum production, shortness of breath and wheezing.   Cardiovascular: Negative for chest pain, palpitations, orthopnea, leg swelling and PND.  Gastrointestinal: Negative for nausea, vomiting, abdominal pain, diarrhea, constipation and blood in stool.  Genitourinary: Negative for dysuria, urgency, frequency and hematuria.  Musculoskeletal: Negative for falls.  Neurological: Negative for dizziness, tremors, focal weakness and headaches.  Endo/Heme/Allergies: Does not bruise/bleed easily.  Psychiatric/Behavioral: Negative for depression. The patient does not have insomnia.     VITAL SIGNS: Blood pressure 144/76, pulse 82, temperature 98.5 F (36.9 C), temperature source Oral, resp. rate 18, height   (1.93 m), weight 111.766 kg (246 lb 6.4 oz), SpO2 100 %.  PHYSICAL EXAMINATION:   GENERAL:  54 y.o.-year-old patient lying in the bed with no acute distress.  EYES: Pupils equal, round, reactive to light and accommodation. No scleral icterus. Extraocular muscles intact.  HEENT: Head atraumatic, normocephalic. Oropharynx and nasopharynx clear.  NECK:  Supple, no jugular venous distention. No thyroid enlargement, no tenderness.  LUNGS: Normal breath sounds bilaterally, no wheezing, rales,rhonchi or crepitation. No use of accessory muscles of respiration.  CARDIOVASCULAR: S1, S2 normal. No murmurs, rubs, or gallops.  ABDOMEN: Soft, nontender, nondistended. Bowel sounds present. No organomegaly or mass.  EXTREMITIES: No pedal edema, cyanosis, or clubbing. Bilateral feet Dressing, no bleeding, no swelling or pretibial area .  NEUROLOGIC: Cranial nerves II through XII are intact. Muscle strength 5/5 in all extremities. Sensation intact. Gait not checked.  PSYCHIATRIC: The patient is alert and oriented x 3.  SKIN: No obvious rash, lesion, or ulcer.   ORDERS/RESULTS REVIEWED:   CBC  Recent Labs Lab 07/11/15 1233 07/12/15 0338 07/13/15 0629  WBC 18.6* 13.1* 12.5*  HGB 13.1 11.3* 11.2*  HCT 40.3 32.8* 34.6*  PLT 419 404 454*  MCV 100.1* 99.2 100.4*  MCH 32.7 34.4* 32.6  MCHC 32.6 34.6 32.5  RDW 11.8 12.0 11.8  LYMPHSABS 1.1  --   --   MONOABS 1.8*  --   --   EOSABS 0.3  --   --   BASOSABS 0.1  --   --    ------------------------------------------------------------------------------------------------------------------  Chemistries   Recent Labs Lab 07/11/15 1233 07/12/15 0338  NA 136 138  K 3.3* 3.7  CL 100* 101  CO2 24 29  GLUCOSE 73  110*  BUN 7 8  CREATININE 0.69 0.77  CALCIUM 8.9 8.4*  AST 21 13*  ALT 30 22  ALKPHOS 94 66  BILITOT 0.8 0.9    ------------------------------------------------------------------------------------------------------------------ estimated creatinine clearance is 141.2 mL/min (by C-G formula based on Cr of 0.77). ------------------------------------------------------------------------------------------------------------------ No results for input(s): TSH, T4TOTAL, T3FREE, THYROIDAB in the last 72 hours.  Invalid input(s): FREET3  Cardiac Enzymes No results for input(s): CKMB, TROPONINI, MYOGLOBIN in the last 168 hours.  Invalid input(s): CK ------------------------------------------------------------------------------------------------------------------ Invalid input(s): POCBNP ---------------------------------------------------------------------------------------------------------------  RADIOLOGY: No results found.  EKG:  Orders placed or performed during the hospital encounter of 07/11/15  . ED EKG  . ED EKG    ASSESSMENT AND PLAN:  Active Problems:   SIRS (systemic inflammatory response syndrome) (HCC)   Gangrene (HCC) 1. 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 Blood cultures are negative so far. MRSA PCR is negative. Appreciate ID input .  Likely discharge tomorrow, provided intraoperative wound cultures unknown . Surface wound cultures showed skin Flora  2. Left great toe  gas gangrene as well as right second MTPJ ulceration, status post debridement, continue antibiotic therapy and wound care per podiatry , will likely need home health upon discharge, get physical therapy involved 3. Hypokalemia, resolved 4. Leukocytosis, improving with therapy 5. Anemia of chronic disease and due to rehydration., stable with therapy , no indication to transfuse 6. Tobacco  abuse , continue nicotine replacement therapy , discussed again for  at least 5 more minutes  Management plans discussed with the patient, family and they are in agreement.   DRUG ALLERGIES: No Known Allergies  CODE STATUS:     Code Status Orders        Start     Ordered   07/11/15 1445  Full code   Continuous     07/11/15 1444      TOTAL TIME TAKING CARE OF The patient 35 minutes Gustava Berland M.D on 07/14/2015 at 3:54 PM  Between 7am to 6pm - Pager - [phone]  After 6pm go to [email or web address] - password [email or web address] [facility]  [facility]  Office  [phone]  CC: Primary care physician; No primary care provider on file.

## 2015-07-14 NOTE — Progress Notes (Signed)
Left foot doing well.  Skin flap well perfused and minimal drainage.  No lymphagitis.  Right foot is stable.  OK from podiatry standpoint for d/c.  F/u with me in 1 week.  Change dressing with dry dressing as needed.

## 2015-07-14 NOTE — Progress Notes (Signed)
[facility] Date of Admission:  07/11/2015     ID: [name] [name] is a 54 y.o. male with gangrene, s/p toe amputation  Active Problems:   SIRS (systemic inflammatory response syndrome) (HCC)   Gangrene (HCC)   Subjective: Wants to go home. No fevers. Foot feels ok  ROS  Eleven systems are reviewed and negative except per hpi  Medications:  Antibiotics Given (last 72 hours)    Date/Time Action Medication Dose Rate   07/11/15 2100 Given   vancomycin (VANCOCIN) 1,250 mg in sodium chloride 0.9 % 250 mL IVPB 1,250 mg 166.7 mL/hr   07/11/15 2107 Given   piperacillin-tazobactam (ZOSYN) IVPB 3.375 g 3.375 g 12.5 mL/hr   07/12/15 0548 Given   piperacillin-tazobactam (ZOSYN) IVPB 3.375 g 3.375 g 12.5 mL/hr   07/12/15 0548 Given   vancomycin (VANCOCIN) 1,250 mg in sodium chloride 0.9 % 250 mL IVPB 1,250 mg 166.7 mL/hr   07/12/15 1305 Given   piperacillin-tazobactam (ZOSYN) IVPB 3.375 g 3.375 g 12.5 mL/hr   07/12/15 2044 Given   piperacillin-tazobactam (ZOSYN) IVPB 3.375 g 3.375 g 12.5 mL/hr   07/12/15 2044 Given   vancomycin (VANCOCIN) 1,250 mg in sodium chloride 0.9 % 250 mL IVPB 1,250 mg 166.7 mL/hr   07/13/15 [id number] Given   piperacillin-tazobactam (ZOSYN) IVPB 3.375 g 3.375 g 12.5 mL/hr   07/13/15 [id number] Given   vancomycin (VANCOCIN) 1,250 mg in sodium chloride 0.9 % 250 mL IVPB 1,250 mg 166.7 mL/hr   07/13/15 1401 Given   piperacillin-tazobactam (ZOSYN) IVPB 3.375 g 3.375 g 12.5 mL/hr   07/13/15 2021 Given   vancomycin (VANCOCIN) 1,500 mg in sodium chloride 0.9 % 500 mL IVPB 1,500 mg 250 mL/hr   07/13/15 2213 Given   piperacillin-tazobactam (ZOSYN) IVPB 3.375 g 3.375 g 12.5 mL/hr   07/14/15 0519 Given   piperacillin-tazobactam (ZOSYN) IVPB 3.375 g 3.375 g 12.5 mL/hr   07/14/15 0519 Given   vancomycin (VANCOCIN) 1,500 mg in sodium chloride 0.9 % 500 mL IVPB 1,500 mg 250 mL/hr     . aspirin EC  81 mg Oral Daily  . docusate sodium  100 mg Oral  BID  . heparin  5,000 Units Subcutaneous 3 times per day  . pantoprazole  40 mg Oral Daily  . piperacillin-tazobactam (ZOSYN)  IV  3.375 g Intravenous 3 times per day  . vancomycin  1,500 mg Intravenous Q12H    Objective: Vital signs in last 24 hours: Temp:  [98.2 F (36.8 C)-98.8 F (37.1 C)] 98.8 F (37.1 C) (11/16 1601) Pulse Rate:  [72-90] 90 (11/16 1601) Resp:  [18-20] 18 (11/16 1601) BP: (111-144)/(69-76) 117/71 mmHg (11/16 1601) SpO2:  [96 %-100 %] 98 % (11/16 1601) Weight:  [111.766 kg (246 lb 6.4 oz)] 111.766 kg (246 lb 6.4 oz) (11/16 0432) Constitutional: He is oriented to person, place, and time. He appears well-developed and well-nourished. No distress.  HENT:  Mouth/Throat: Oropharynx is clear and moist. No oropharyngeal exudate.  Cardiovascular: Normal rate, regular rhythm and normal heart sounds. Exam reveals no gallop and no friction rub.  No murmur heard.  Pulmonary/Chest: Effort normal and breath sounds normal. No respiratory distress. He has no wheezes.  Abdominal: Soft. Bowel sounds are normal. He exhibits no distension. There is no tenderness.  Lymphadenopathy:  He has no cervical adenopathy.  Neurological: He is alert and oriented to person, place, and time.  Skin:R leg wrapped post op   Lab Results  Recent Labs  07/12/15 [phone] 07/13/15 [phone]  WBC 13.1* 12.5*  HGB 11.3* 11.2*  HCT 32.8* 34.6*  NA 138  --   K 3.7  --   CL 101  --   CO2 29  --   BUN 8  --   CREATININE 0.77  --     Microbiology: Results for orders placed or performed during the hospital encounter of 07/11/15  Blood culture (routine x 2)     Status: None (Preliminary result)   Collection Time: 07/11/15 12:33 PM  Result Value Ref Range Status   Specimen Description BLOOD RIGHT FOREARM  Final   Special Requests BOTTLES DRAWN AEROBIC AND ANAEROBIC 1 CC  Final   Culture NO GROWTH 3 DAYS  Final   Report Status PENDING  Incomplete  Blood culture (routine x 2)     Status: None  (Preliminary result)   Collection Time: 07/11/15 12:33 PM  Result Value Ref Range Status   Specimen Description BLOOD LEFT ASSIST CONTROL  Final   Special Requests BOTTLES DRAWN AEROBIC AND ANAEROBIC 1 CC  Final   Culture NO GROWTH 3 DAYS  Final   Report Status PENDING  Incomplete  Surgical pcr screen     Status: None   Collection Time: 07/11/15  3:04 PM  Result Value Ref Range Status   MRSA, PCR NEGATIVE NEGATIVE Final   Staphylococcus aureus NEGATIVE NEGATIVE Final    Comment:        The Xpert SA Assay (FDA approved for NASAL specimens in patients over 40 years of age), is one component of a comprehensive surveillance program.  Test performance has been validated by [facility] for patients greater than or equal to 36 year old. It is not intended to diagnose infection nor to guide or monitor treatment.   Wound culture     Status: None   Collection Time: 07/11/15 11:30 PM  Result Value Ref Range Status   Specimen Description ABSCESS  Final   Special Requests NONE  Final   Culture NORMAL SKIN FLORA  Final   Report Status 07/14/2015 FINAL  Final  Wound culture     Status: None (Preliminary result)   Collection Time: 07/12/15 10:37 AM  Result Value Ref Range Status   Specimen Description WOUND  Final   Special Requests NONE  Final   Gram Stain   Final    MODERATE WBC SEEN MANY GRAM POSITIVE COCCI IN PAIRS IN CLUSTERS FEW GRAM NEGATIVE RODS    Culture NORMAL SKIN FLORA  Final   Report Status PENDING  Incomplete    Studies/Results: No results found.  Assessment/Plan: [name] [name] is a 54 y.o. male with tobacco abuse admitted with gangrenous toe over several weeks. No obvious injury, PVD or DM. Now s/p amputation 11/14. Cx pending. WBC down 18-> 12.   Recommendations Continue current abx Since clinically improved can transition to oral treatment as long as no significant resistance noted on culture- start augmenitn for 14 days I will fu wound cx Will need  likely 2-4 weeks therapy and fu as otpt Thank you very much for the consult. Will follow with you.  [name] [name]   07/14/2015, 4:21 PM

## 2015-07-14 NOTE — Discharge Summary (Signed)
[facility] [place] [facility]   PATIENT NAME: [name] NightingaleBarney [name]    MR#:  [id number]  DATE OF BIRTH:  Aug 14, 1961  DATE OF ADMISSION:  07/11/2015 ADMITTING PHYSICIAN: Marguarite ArbourJeffrey D Sparks, MD  DATE OF DISCHARGE: No discharge date for patient encounter.  PRIMARY CARE PHYSICIAN: No primary care provider on file.     ADMISSION DIAGNOSIS:  Gangrene of toe (HCC) [I96]  DISCHARGE DIAGNOSIS:  Principal Problem:   SIRS (systemic inflammatory response syndrome) (HCC) Active Problems:   Toe gangrene (HCC)   Leukocytosis   Tobacco abuse   Hypokalemia   Anemia of chronic disease   SECONDARY DIAGNOSIS:  History reviewed. No pertinent past medical history.  .pro HOSPITAL COURSE:  The patient is a 54 year old male with past medical history significant for history of drug abuse who presents to the hospital with complaints of left great toe pain and swelling. In emergency room, he was noted to have purulence or necrosis at left great toe area. Left foot x-ray showed the left great toe soft tissue emphysema compatible with infection/gangrene. His white blood cell count was also found to be elevated. Patient was seen by podiatrist and recommended left great toe amputation, performed 14th of November 2016. Patient was managed on vancomycin and Zosyn while in the hospital and clinically improved. He was seen by podiatrist on November 16 and felt that patient's skin flap was well perfused and had minimal drainage and no lymphangitis, home discharge was recommended and follow-up with him in about 1 week. Patient was seen by infectious disease specialist, Dr. Sampson GoonFitzgerald and recommended outpatient Augmentin therapy and follow up with him as outpatient for antibiotic management. Discussion by problem 1. Sepsis due to left great toe gas gangrene , status post left great toe amputation at [facility], patient was continued on broad-spectrum antibiotic therapy with vancomycin and Zosyn while in  the hospital, improved clinically , Dr. Sampson GoonFitzgerald recommended to continue Augmentin and follow-up with him as outpatient . Wound cultures are still pending. Blood cultures are negative so far. MRSA PCR is negative.  2. Left great toe gas gangrene as well as right second MTPJ ulceration, status post debridement, continue antibiotic therapy orally and wound care per podiatry , home health physical therapy and nursing will be arranged upon discharge 3. Hypokalemia, resolved 4. Leukocytosis, improved with therapy 5. Anemia of chronic disease and due to rehydration., stable with therapy , no indication to transfuse 6. Tobacco abuse , patient was advised to continue nicotine replacement therapy , counseling was provided  DISCHARGE CONDITIONS:   Stable  CONSULTS OBTAINED:  Treatment Team:  Gwyneth RevelsJustin Fowler, DPM Renford DillsGregory G Schnier, MD Clydie Braunavid Fitzgerald, MD  DRUG ALLERGIES:  No Known Allergies  DISCHARGE MEDICATIONS:   Current Discharge Medication List    START taking these medications   Details  amoxicillin-clavulanate (AUGMENTIN) 875-125 MG tablet Take 1 tablet by mouth 2 (two) times daily. Qty: 20 tablet, Refills: 2    docusate sodium (COLACE) 100 MG capsule Take 1 capsule (100 mg total) by mouth 2 (two) times daily. Qty: 10 capsule, Refills: 0    HYDROcodone-acetaminophen (NORCO/VICODIN) 5-325 MG tablet Take 1-2 tablets by mouth every 4 (four) hours as needed for moderate pain. Qty: 30 tablet, Refills: 0    nicotine polacrilex (NICORETTE) 2 MG gum Take 1 each (2 mg total) by mouth as needed for smoking cessation. Qty: 100 tablet, Refills: 0      CONTINUE these medications which have NOT CHANGED   Details  naproxen (EC  NAPROSYN) 500 MG EC tablet Take 1 tablet (500 mg total) by mouth 2 (two) times daily with a meal. Qty: 30 tablet, Refills: 0      STOP taking these medications     ibuprofen (ADVIL,MOTRIN) 200 MG tablet          DISCHARGE INSTRUCTIONS:    Patient is to  follow-up with primary care physician, podiatrist, infectious disease specialist  If you experience worsening of your admission symptoms, develop shortness of breath, life threatening emergency, suicidal or homicidal thoughts you must seek medical attention immediately by calling 911 or calling your MD immediately  if symptoms less severe.  You Must read complete instructions/literature along with all the possible adverse reactions/side effects for all the Medicines you take and that have been prescribed to you. Take any new Medicines after you have completely understood and accept all the possible adverse reactions/side effects.   Please note  You were cared for by a hospitalist during your hospital stay. If you have any questions about your discharge medications or the care you received while you were in the hospital after you are discharged, you can call the unit and asked to speak with the hospitalist on call if the hospitalist that took care of you is not available. Once you are discharged, your primary care physician will handle any further medical issues. Please note that NO REFILLS for any discharge medications will be authorized once you are discharged, as it is imperative that you return to your primary care physician (or establish a relationship with a primary care physician if you do not have one) for your aftercare needs so that they can reassess your need for medications and monitor your lab values.    Today   CHIEF COMPLAINT:   Chief Complaint  Patient presents with  . Foot Pain    HISTORY OF PRESENT ILLNESS:  [name] [name]  is a 54 y.o. male with a known history of drug abuse who presents to the hospital with complaints of left great toe pain and swelling. In emergency room, he was noted to have purulence or necrosis at left great toe area. Left foot x-ray showed the left great toe soft tissue emphysema compatible with infection/gangrene. His white blood cell count was also found  to be elevated. Patient was seen by podiatrist and recommended left great toe amputation, performed 14th of November 2016. Patient was managed on vancomycin and Zosyn while in the hospital and clinically improved. He was seen by podiatrist on November 16 and felt that patient's skin flap was well perfused and had minimal drainage and no lymphangitis, home discharge was recommended and follow-up with him in about 1 week. Patient was seen by infectious disease specialist, Dr. Sampson Goon and recommended outpatient Augmentin therapy and follow up with him as outpatient for antibiotic management. Discussion by problem 1. Sepsis due to left great toe gas gangrene , status post left great toe amputation at [facility], patient was continued on broad-spectrum antibiotic therapy with vancomycin and Zosyn while in the hospital, improved clinically , Dr. Sampson Goon recommended to continue Augmentin and follow-up with him as outpatient . Wound cultures are still pending. Blood cultures are negative so far. MRSA PCR is negative.  2. Left great toe gas gangrene as well as right second MTPJ ulceration, status post debridement, continue antibiotic therapy orally and wound care per podiatry , home health physical therapy and nursing will be arranged upon discharge 3. Hypokalemia, resolved 4. Leukocytosis, improved with therapy 5. Anemia of chronic disease  and due to rehydration., stable with therapy , no indication to transfuse 6. Tobacco abuse , patient was advised to continue nicotine replacement therapy , counseling was provided     VITAL SIGNS:  Blood pressure 117/71, pulse 90, temperature 98.8 F (37.1 C), temperature source Oral, resp. rate 18, height  (1.93 m), weight 111.766 kg (246 lb 6.4 oz), SpO2 98 %.  I/O:   Intake/Output Summary (Last 24 hours) at 07/14/15 1736 Last data filed at 07/14/15 1600  Gross per 24 hour  Intake 2201.66 ml  Output   4450 ml  Net -2248.34 ml    PHYSICAL EXAMINATION:   GENERAL:  54 y.o.-year-old patient lying in the bed with no acute distress.  EYES: Pupils equal, round, reactive to light and accommodation. No scleral icterus. Extraocular muscles intact.  HEENT: Head atraumatic, normocephalic. Oropharynx and nasopharynx clear.  NECK:  Supple, no jugular venous distention. No thyroid enlargement, no tenderness.  LUNGS: Normal breath sounds bilaterally, no wheezing, rales,rhonchi or crepitation. No use of accessory muscles of respiration.  CARDIOVASCULAR: S1, S2 normal. No murmurs, rubs, or gallops.  ABDOMEN: Soft, non-tender, non-distended. Bowel sounds present. No organomegaly or mass.  EXTREMITIES: No pedal edema, cyanosis, or clubbing. Left foot is dressed  NEUROLOGIC: Cranial nerves II through XII are intact. Muscle strength 5/5 in all extremities. Sensation intact. Gait not checked.  PSYCHIATRIC: The patient is alert and oriented x 3.  SKIN: No obvious rash, lesion, or ulcer.   DATA REVIEW:   CBC  Recent Labs Lab 07/13/15 0629  WBC 12.5*  HGB 11.2*  HCT 34.6*  PLT 454*    Chemistries   Recent Labs Lab 07/12/15 0338  NA 138  K 3.7  CL 101  CO2 29  GLUCOSE 110*  BUN 8  CREATININE 0.77  CALCIUM 8.4*  AST 13*  ALT 22  ALKPHOS 66  BILITOT 0.9    Cardiac Enzymes No results for input(s): TROPONINI in the last 168 hours.  Microbiology Results  Results for orders placed or performed during the hospital encounter of 07/11/15  Blood culture (routine x 2)     Status: None (Preliminary result)   Collection Time: 07/11/15 12:33 PM  Result Value Ref Range Status   Specimen Description BLOOD RIGHT FOREARM  Final   Special Requests BOTTLES DRAWN AEROBIC AND ANAEROBIC 1 CC  Final   Culture NO GROWTH 3 DAYS  Final   Report Status PENDING  Incomplete  Blood culture (routine x 2)     Status: None (Preliminary result)   Collection Time: 07/11/15 12:33 PM  Result Value Ref Range Status   Specimen Description BLOOD LEFT ASSIST CONTROL   Final   Special Requests BOTTLES DRAWN AEROBIC AND ANAEROBIC 1 CC  Final   Culture NO GROWTH 3 DAYS  Final   Report Status PENDING  Incomplete  Surgical pcr screen     Status: None   Collection Time: 07/11/15  3:04 PM  Result Value Ref Range Status   MRSA, PCR NEGATIVE NEGATIVE Final   Staphylococcus aureus NEGATIVE NEGATIVE Final    Comment:        The Xpert SA Assay (FDA approved for NASAL specimens in patients over 26 years of age), is one component of a comprehensive surveillance program.  Test performance has been validated by [facility] for patients greater than or equal to 61 year old. It is not intended to diagnose infection nor to guide or monitor treatment.   Wound culture     Status:  None   Collection Time: 07/11/15 11:30 PM  Result Value Ref Range Status   Specimen Description ABSCESS  Final   Special Requests NONE  Final   Culture NORMAL SKIN FLORA  Final   Report Status 07/14/2015 FINAL  Final  Wound culture     Status: None (Preliminary result)   Collection Time: 07/12/15 10:37 AM  Result Value Ref Range Status   Specimen Description WOUND  Final   Special Requests NONE  Final   Gram Stain   Final    MODERATE WBC SEEN MANY GRAM POSITIVE COCCI IN PAIRS IN CLUSTERS FEW GRAM NEGATIVE RODS    Culture NORMAL SKIN FLORA  Final   Report Status PENDING  Incomplete    RADIOLOGY:  No results found.  EKG:   Orders placed or performed during the hospital encounter of 07/11/15  . ED EKG  . ED EKG      Management plans discussed with the patient, family and they are in agreement.  CODE STATUS:     Code Status Orders        Start     Ordered   07/11/15 1445  Full code   Continuous     07/11/15 1444      TOTAL TIME TAKING CARE OF THIS PATIENT: 40 minutes.    Katharina Caper M.D on 07/14/2015 at 5:36 PM  Between 7am to 6pm - Pager - [phone]  After 6pm go to [email or web address] - password [email or web address] [facility]  [place] [facility]  Office   [phone]  CC: Primary care physician; No primary care provider on file.

## 2015-07-14 NOTE — Discharge Planning (Signed)
RN noted slight redness and warmth on LF leg to just below knee and marked with pen line.  Educated pt to observer leg and feet daily and know current baseline.  If redness or warmth increases or expands above marked line to contact dr.  Also to observe bilat toes to observe for increased drainage, discoloration, swelling or redness.  Pt should also contact dr. If he notices having a temperature.

## 2015-07-14 NOTE — Progress Notes (Signed)
Discharge instructions reviewed with patient. F/u appointments made. Dressing supplies sent with patient.

## 2015-07-14 NOTE — Progress Notes (Signed)
Per Dr. Winona LegatoVaickute, pt can not be discharged until wound culture results are avail.

## 2015-07-15 LAB — WOUND CULTURE: Culture: NORMAL

## 2015-07-15 NOTE — Care Management (Addendum)
Post discharge: Home health arranged with [facility] as patient did not have preference but agreed to home health services. Jason with [facility] notified 07/14/2015. Update: home health orders were not discontinued by MD. Order shows as discontinued because patient discharged home- nurse discontinued all orders at discharge per protocol.  I was unable to contact patient- number did not work however, I left a message for patient's father Reita ClicheBobby to notify patient- he agreed. Barbara CowerJason with [facility] updated.

## 2015-07-16 LAB — CULTURE, BLOOD (ROUTINE X 2)
CULTURE: NO GROWTH
Culture: NO GROWTH

## 2015-09-03 ENCOUNTER — Emergency Department: Payer: BLUE CROSS/BLUE SHIELD

## 2015-09-03 ENCOUNTER — Encounter: Payer: Self-pay | Admitting: Emergency Medicine

## 2015-09-03 ENCOUNTER — Inpatient Hospital Stay: Payer: BLUE CROSS/BLUE SHIELD

## 2015-09-03 ENCOUNTER — Inpatient Hospital Stay
Admission: EM | Admit: 2015-09-03 | Discharge: 2015-09-09 | DRG: 504 | Disposition: A | Discharge status: Home / Self Care | Payer: BLUE CROSS/BLUE SHIELD | Attending: Internal Medicine | Admitting: Internal Medicine

## 2015-09-03 DIAGNOSIS — L97529 Non-pressure chronic ulcer of other part of left foot with unspecified severity: Secondary | ICD-10-CM | POA: Diagnosis present

## 2015-09-03 DIAGNOSIS — F17210 Nicotine dependence, cigarettes, uncomplicated: Secondary | ICD-10-CM | POA: Diagnosis present

## 2015-09-03 DIAGNOSIS — Z823 Family history of stroke: Secondary | ICD-10-CM

## 2015-09-03 DIAGNOSIS — M869 Osteomyelitis, unspecified: Secondary | ICD-10-CM | POA: Diagnosis present

## 2015-09-03 DIAGNOSIS — M86172 Other acute osteomyelitis, left ankle and foot: Principal | ICD-10-CM

## 2015-09-03 DIAGNOSIS — Z9119 Patient's noncompliance with other medical treatment and regimen: Secondary | ICD-10-CM | POA: Diagnosis not present

## 2015-09-03 DIAGNOSIS — A498 Other bacterial infections of unspecified site: Secondary | ICD-10-CM

## 2015-09-03 DIAGNOSIS — D638 Anemia in other chronic diseases classified elsewhere: Secondary | ICD-10-CM | POA: Diagnosis present

## 2015-09-03 DIAGNOSIS — F1410 Cocaine abuse, uncomplicated: Secondary | ICD-10-CM | POA: Diagnosis present

## 2015-09-03 DIAGNOSIS — Z833 Family history of diabetes mellitus: Secondary | ICD-10-CM | POA: Diagnosis not present

## 2015-09-03 DIAGNOSIS — L089 Local infection of the skin and subcutaneous tissue, unspecified: Secondary | ICD-10-CM

## 2015-09-03 DIAGNOSIS — L03116 Cellulitis of left lower limb: Secondary | ICD-10-CM | POA: Diagnosis present

## 2015-09-03 DIAGNOSIS — B964 Proteus (mirabilis) (morganii) as the cause of diseases classified elsewhere: Secondary | ICD-10-CM | POA: Diagnosis present

## 2015-09-03 DIAGNOSIS — E876 Hypokalemia: Secondary | ICD-10-CM | POA: Diagnosis present

## 2015-09-03 DIAGNOSIS — Z1611 Resistance to penicillins: Secondary | ICD-10-CM | POA: Diagnosis present

## 2015-09-03 DIAGNOSIS — D649 Anemia, unspecified: Secondary | ICD-10-CM

## 2015-09-03 DIAGNOSIS — Z89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: Secondary | ICD-10-CM | POA: Diagnosis not present

## 2015-09-03 DIAGNOSIS — L03032 Cellulitis of left toe: Secondary | ICD-10-CM | POA: Diagnosis present

## 2015-09-03 HISTORY — DX: Gangrene, not elsewhere classified: I96

## 2015-09-03 HISTORY — DX: Anemia in other chronic diseases classified elsewhere: D63.8

## 2015-09-03 HISTORY — DX: Nicotine dependence, unspecified, uncomplicated: F17.200

## 2015-09-03 LAB — URINE DRUG SCREEN, QUALITATIVE (ARMC ONLY)
Amphetamines, Ur Screen: NOT DETECTED
BARBITURATES, UR SCREEN: NOT DETECTED
BENZODIAZEPINE, UR SCRN: NOT DETECTED
Cannabinoid 50 Ng, Ur ~~LOC~~: NOT DETECTED
Cocaine Metabolite,Ur ~~LOC~~: POSITIVE — AB
MDMA (Ecstasy)Ur Screen: NOT DETECTED
METHADONE SCREEN, URINE: NOT DETECTED
OPIATE, UR SCREEN: NOT DETECTED
PHENCYCLIDINE (PCP) UR S: NOT DETECTED
TRICYCLIC, UR SCREEN: NOT DETECTED

## 2015-09-03 LAB — SURGICAL PCR SCREEN
MRSA, PCR: NEGATIVE
STAPHYLOCOCCUS AUREUS: NEGATIVE

## 2015-09-03 LAB — CBC WITH DIFFERENTIAL/PLATELET
BASOS ABS: 0 10*3/uL (ref 0–0.1)
Basophils Relative: 1 %
Eosinophils Absolute: 0.4 10*3/uL (ref 0–0.7)
Eosinophils Relative: 8 %
HCT: 38.7 % — ABNORMAL LOW (ref 40.0–52.0)
HEMOGLOBIN: 13 g/dL (ref 13.0–18.0)
LYMPHS ABS: 0.9 10*3/uL — AB (ref 1.0–3.6)
LYMPHS PCT: 18 %
MCH: 33.7 pg (ref 26.0–34.0)
MCHC: 33.7 g/dL (ref 32.0–36.0)
MCV: 99.8 fL (ref 80.0–100.0)
Monocytes Absolute: 0.6 10*3/uL (ref 0.2–1.0)
Monocytes Relative: 12 %
NEUTROS ABS: 2.9 10*3/uL (ref 1.4–6.5)
NEUTROS PCT: 61 %
Platelets: 257 10*3/uL (ref 150–440)
RBC: 3.87 MIL/uL — AB (ref 4.40–5.90)
RDW: 13.6 % (ref 11.5–14.5)
WBC: 4.7 10*3/uL (ref 3.8–10.6)

## 2015-09-03 LAB — BASIC METABOLIC PANEL
ANION GAP: 12 (ref 5–15)
BUN: 6 mg/dL (ref 6–20)
CHLORIDE: 106 mmol/L (ref 101–111)
CO2: 21 mmol/L — ABNORMAL LOW (ref 22–32)
Calcium: 9.2 mg/dL (ref 8.9–10.3)
Creatinine, Ser: 0.65 mg/dL (ref 0.61–1.24)
GFR calc Af Amer: >60 mL/min (ref >60)
GFR calc non Af Amer: >60 mL/min (ref >60)
GLUCOSE: 98 mg/dL (ref 65–99)
POTASSIUM: 3.3 mmol/L — AB (ref 3.5–5.1)
Sodium: 139 mmol/L (ref 135–145)

## 2015-09-03 LAB — URINALYSIS COMPLETE WITH MICROSCOPIC (ARMC ONLY)
Bacteria, UA: NONE SEEN
Bilirubin Urine: NEGATIVE
GLUCOSE, UA: NEGATIVE mg/dL
Hgb urine dipstick: NEGATIVE
Ketones, ur: NEGATIVE mg/dL
Leukocytes, UA: NEGATIVE
Nitrite: NEGATIVE
Protein, ur: NEGATIVE mg/dL
RBC / HPF: NONE SEEN RBC/hpf (ref 0–5)
SPECIFIC GRAVITY, URINE: 1.001 — AB (ref 1.005–1.030)
Squamous Epithelial / LPF: NONE SEEN
WBC UA: NONE SEEN WBC/hpf (ref 0–5)
pH: 5 (ref 5.0–8.0)

## 2015-09-03 MED ORDER — VANCOMYCIN HCL IN DEXTROSE 1-5 GM/200ML-% IV SOLN
1000.0000 mg | Freq: Three times a day (TID) | INTRAVENOUS | Status: DC
  Administered 2015-09-03 – 2015-09-05 (×6): 1000 mg via INTRAVENOUS
  Filled 2015-09-03 (×9): qty 200

## 2015-09-03 MED ORDER — VANCOMYCIN HCL IN DEXTROSE 1-5 GM/200ML-% IV SOLN
1000.0000 mg | Freq: Once | INTRAVENOUS | Status: AC
  Administered 2015-09-03: 1000 mg via INTRAVENOUS
  Filled 2015-09-03: qty 200

## 2015-09-03 MED ORDER — NICOTINE 14 MG/24HR TD PT24
14.0000 mg | MEDICATED_PATCH | Freq: Every day | TRANSDERMAL | Status: DC
  Administered 2015-09-03 – 2015-09-09 (×6): 14 mg via TRANSDERMAL
  Filled 2015-09-03 (×7): qty 1

## 2015-09-03 MED ORDER — POLYETHYLENE GLYCOL 3350 17 G PO PACK: 17.0000 g | PACK | Freq: Every day | ORAL | Status: DC | PRN

## 2015-09-03 MED ORDER — SODIUM CHLORIDE 0.9 % IV SOLN
INTRAVENOUS | Status: DC
  Administered 2015-09-03 – 2015-09-04 (×2): via INTRAVENOUS

## 2015-09-03 MED ORDER — PIPERACILLIN-TAZOBACTAM 3.375 G IVPB: 3.3750 g | Freq: Once | INTRAVENOUS | Status: DC

## 2015-09-03 MED ORDER — CLINDAMYCIN PHOSPHATE 600 MG/50ML IV SOLN: 600.0000 mg | Freq: Once | INTRAVENOUS | Status: DC

## 2015-09-03 MED ORDER — ACETAMINOPHEN 650 MG RE SUPP: 650.0000 mg | Freq: Four times a day (QID) | RECTAL | Status: DC | PRN

## 2015-09-03 MED ORDER — SODIUM CHLORIDE 0.9 % IV BOLUS (SEPSIS)
500.0000 mL | Freq: Once | INTRAVENOUS | Status: AC
  Administered 2015-09-03: 500 mL via INTRAVENOUS

## 2015-09-03 MED ORDER — DOCUSATE SODIUM 100 MG PO CAPS
100.0000 mg | ORAL_CAPSULE | Freq: Two times a day (BID) | ORAL | Status: DC
  Administered 2015-09-03 – 2015-09-09 (×11): 100 mg via ORAL
  Filled 2015-09-03 (×12): qty 1

## 2015-09-03 MED ORDER — GADOBENATE DIMEGLUMINE 529 MG/ML IV SOLN
20.0000 mL | Freq: Once | INTRAVENOUS | Status: AC | PRN
  Administered 2015-09-03: 20 mL via INTRAVENOUS

## 2015-09-03 MED ORDER — HYDROCODONE-ACETAMINOPHEN 5-325 MG PO TABS
1.0000 | ORAL_TABLET | ORAL | Status: DC | PRN
  Administered 2015-09-07 – 2015-09-08 (×4): 2 via ORAL
  Administered 2015-09-09: 1 via ORAL
  Filled 2015-09-03 (×2): qty 2
  Filled 2015-09-03: qty 1
  Filled 2015-09-03 (×2): qty 2

## 2015-09-03 MED ORDER — POTASSIUM CHLORIDE CRYS ER 20 MEQ PO TBCR
40.0000 meq | EXTENDED_RELEASE_TABLET | Freq: Once | ORAL | Status: AC
  Administered 2015-09-03: 40 meq via ORAL
  Filled 2015-09-03: qty 2

## 2015-09-03 MED ORDER — ONDANSETRON HCL 4 MG PO TABS: 4.0000 mg | ORAL_TABLET | Freq: Four times a day (QID) | ORAL | Status: DC | PRN

## 2015-09-03 MED ORDER — ENOXAPARIN SODIUM 40 MG/0.4ML ~~LOC~~ SOLN
40.0000 mg | SUBCUTANEOUS | Status: DC
  Administered 2015-09-03 – 2015-09-08 (×6): 40 mg via SUBCUTANEOUS
  Filled 2015-09-03 (×6): qty 0.4

## 2015-09-03 MED ORDER — PIPERACILLIN-TAZOBACTAM 3.375 G IVPB
3.3750 g | Freq: Three times a day (TID) | INTRAVENOUS | Status: DC
  Administered 2015-09-03 – 2015-09-09 (×16): 3.375 g via INTRAVENOUS
  Filled 2015-09-03 (×21): qty 50

## 2015-09-03 MED ORDER — PIPERACILLIN-TAZOBACTAM 3.375 G IVPB
3.3750 g | Freq: Once | INTRAVENOUS | Status: AC
  Administered 2015-09-03: 3.375 g via INTRAVENOUS
  Filled 2015-09-03: qty 50

## 2015-09-03 MED ORDER — ONDANSETRON HCL 4 MG/2ML IJ SOLN: 4.0000 mg | Freq: Four times a day (QID) | INTRAMUSCULAR | Status: DC | PRN

## 2015-09-03 MED ORDER — ACETAMINOPHEN 325 MG PO TABS: 650.0000 mg | ORAL_TABLET | Freq: Four times a day (QID) | ORAL | Status: DC | PRN

## 2015-09-03 NOTE — H&P (Signed)
[facility] - [place] at [facility]   PATIENT NAME: [name] [name]    MR#:  [id number]  DATE OF BIRTH:  09-03-1960  DATE OF ADMISSION:  09/03/2015  PRIMARY CARE PHYSICIAN: No PCP Per Patient   REQUESTING/REFERRING PHYSICIAN: Dr. Toney Rakes  CHIEF COMPLAINT:   Chief Complaint  Patient presents with  . Foot Pain    HISTORY OF PRESENT ILLNESS:  [name] [name]  is a 55 y.o. male with no significant past medical history other than smoking, admission to the hospital here about 2 months ago for left great toe pain and swelling and had that toe amputated by podiatry at the time presents again to the hospital secondary to swelling and foul smelling discharge from the site. Patient does not have a primary care physician. After he was discharged from the hospital on 07/14/2015 on Augmentin, he did not follow up with podiatry as well. He still had the sutures from his debridement and amputation 2 months ago. Sutures were taken out in the emergency room by the ER physician. Patient noticed increased swelling around the amputation site for the last few days, no fevers or chills. Complaints of weakness. This morning he noticed that there has been copious amounts of purulent, foul-smelling discharge from the site. Complains of only minimal pain.. No nausea vomiting or other symptoms. No fevers now, no elevated white count. However x-ray shows possible changes of osteomyelitis.  PAST MEDICAL HISTORY:   Past Medical History  Diagnosis Date  . Toe gangrene (HCC)   . Tobacco use disorder   . Anemia of chronic disease     PAST SURGICAL HISTORY:   Past Surgical History  Procedure Laterality Date  . Irrigation and debridement foot Left 07/12/2015    Procedure: IRRIGATION AND DEBRIDEMENT FOOT;  Surgeon: Gwyneth Revels, DPM;  Location: [facility] ORS;  Service: Podiatry;  Laterality: Left;  . Amputation toe Left 07/12/2015    Procedure: AMPUTATION TOE;  Surgeon: Gwyneth Revels, DPM;   Location: [facility] ORS;  Service: Podiatry;  Laterality: Left;    SOCIAL HISTORY:   Social History  Substance Use Topics  . Smoking status: Current Every Day Smoker -- 0.50 packs/day    Types: Cigarettes  . Smokeless tobacco: Not on file  . Alcohol Use: No    FAMILY HISTORY:   Family History  Problem Relation Age of Onset  . CVA Mother   . CVA Brother   . Diabetes Mellitus II Father     DRUG ALLERGIES:  No Known Allergies  REVIEW OF SYSTEMS:   Review of Systems  Constitutional: Positive for chills. Negative for fever, weight loss and malaise/fatigue.  HENT: Negative for ear discharge, ear pain, hearing loss, nosebleeds and tinnitus.   Eyes: Negative for blurred vision, double vision and photophobia.  Respiratory: Negative for cough, hemoptysis, shortness of breath and wheezing.   Cardiovascular: Negative for chest pain, palpitations, orthopnea and leg swelling.  Gastrointestinal: Negative for heartburn, nausea, vomiting, abdominal pain, diarrhea, constipation and melena.  Genitourinary: Negative for dysuria, urgency, frequency and hematuria.  Musculoskeletal: Positive for myalgias. Negative for back pain and neck pain.       Discharge  From left foot first toe with some swelling.  Skin: Negative for rash.  Neurological: Negative for dizziness, tingling, tremors, sensory change, speech change, focal weakness and headaches.  Endo/Heme/Allergies: Does not bruise/bleed easily.  Psychiatric/Behavioral: Negative for depression.    MEDICATIONS AT HOME:   Prior to Admission medications   Medication Sig Start Date End Date  Taking? Authorizing Provider  amoxicillin-clavulanate (AUGMENTIN) 875-125 MG tablet Take 1 tablet by mouth 2 (two) times daily. Patient not taking: Reported on 09/03/2015 07/14/15   Katharina Caperima Vaickute, MD  docusate sodium (COLACE) 100 MG capsule Take 1 capsule (100 mg total) by mouth 2 (two) times daily. Patient not taking: Reported on 09/03/2015 07/14/15   Katharina Caperima  Vaickute, MD  HYDROcodone-acetaminophen (NORCO/VICODIN) 5-325 MG tablet Take 1-2 tablets by mouth every 4 (four) hours as needed for moderate pain. Patient not taking: Reported on 09/03/2015 07/14/15   Katharina Caperima Vaickute, MD  naproxen (EC NAPROSYN) 500 MG EC tablet Take 1 tablet (500 mg total) by mouth 2 (two) times daily with a meal. Patient not taking: Reported on 07/11/2015 05/23/15   Charlesetta IvoryJenise V Bacon Menshew, PA-C  nicotine polacrilex (NICORETTE) 2 MG gum Take 1 each (2 mg total) by mouth as needed for smoking cessation. Patient not taking: Reported on 09/03/2015 07/14/15   Katharina Caperima Vaickute, MD      VITAL SIGNS:  Blood pressure 122/88, pulse 84, temperature 97.7 F (36.5 C), temperature source Oral, resp. rate 16, height 6\' 4"  (1.93 m), weight 109.77 kg (242 lb), SpO2 98 %.  PHYSICAL EXAMINATION:   Physical Exam  GENERAL:  55 y.o.-year-old patient lying in the bed with no acute distress.  EYES: Pupils equal, round, reactive to light and accommodation. No scleral icterus. Extraocular muscles intact.  HEENT: Head atraumatic, normocephalic. Oropharynx and nasopharynx clear.  NECK:  Supple, no jugular venous distention. No thyroid enlargement, no tenderness.  LUNGS: Normal breath sounds bilaterally, no wheezing, rales,rhonchi or crepitation. No use of accessory muscles of respiration.  CARDIOVASCULAR: S1, S2 normal. No murmurs, rubs, or gallops.  ABDOMEN: Soft, nontender, nondistended. Bowel sounds present. No organomegaly or mass.  EXTREMITIES: No cyanosis, or clubbing. Toes appear dry, left foot s/p first toe amputation with swelling at the stump site and foul smelling discharge at the site. NEUROLOGIC: Cranial nerves II through XII are intact. Muscle strength 5/5 in all extremities. Sensation intact. Gait not checked.  PSYCHIATRIC: The patient is alert and oriented x 3.  SKIN: No obvious rash, lesion, or ulcer.   LABORATORY PANEL:   CBC  Recent Labs Lab 09/03/15 0927  WBC 4.7  HGB 13.0   HCT 38.7*  PLT 257   ------------------------------------------------------------------------------------------------------------------  Chemistries   Recent Labs Lab 09/03/15 0927  NA 139  K 3.3*  CL 106  CO2 21*  GLUCOSE 98  BUN 6  CREATININE 0.65  CALCIUM 9.2   ------------------------------------------------------------------------------------------------------------------  Cardiac Enzymes No results for input(s): TROPONINI in the last 168 hours. ------------------------------------------------------------------------------------------------------------------  RADIOLOGY:  Dg Foot Complete Left  09/03/2015  CLINICAL DATA:  Status post left great toe amputation at [place], left foot pain and swelling, purulent drainage shin EXAM: LEFT FOOT - COMPLETE 3+ VIEW COMPARISON:  07/11/2015 FINDINGS: Three views of the left foot submitted the patient is status post amputation of great toe. There is significant soft tissue swelling at amputation site probable due to infection or cellulitis. Subtle cortical irregularity and focal osteopenia distal aspect of first metatarsal. This is highly suspicious for osteomyelitis. Clinical correlation is necessary. Further correlation with MRI is recommended as clinically warranted. IMPRESSION: Status post amputation of left first toe.There is significant soft tissue swelling at amputation site probable due to infection or cellulitis. Subtle cortical irregularity and focal osteopenia distal aspect of first metatarsal. This is highly suspicious for osteomyelitis. Clinical correlation is necessary. Further correlation with MRI is recommended as clinically warranted. Electronically Signed  By: Natasha Mead M.D.   On: 09/03/2015 10:21    EKG:   Orders placed or performed during the hospital encounter of 07/11/15  . ED EKG  . ED EKG    IMPRESSION AND PLAN:   [name] [name]  is a 55 y.o. male with no significant past medical history other than  smoking, admission to the hospital here about 2 months ago for left great toe pain and swelling and had that toe amputated by podiatry at the time presents again to the hospital secondary to swelling and foul smelling discharge from the site.  #1 osteomyelitis-of left first metatarsal, at the previous amputation site. -No follow-up after his amputation. Admit, MRI of the foot ordered. -ID consult and podiatry consult. -Blood cultures and wound cultures ordered. -Empirically on vancomycin and Zosyn for now.  #2 hypokalemia-being replaced  #3 anemia of chronic disease-stable  #4 tobacco use disorder-nicotine patch. Counseled against smoking for 3 minutes.  #5 DVT prophylaxis-on Lovenox    All the records are reviewed and case discussed with ED provider. Management plans discussed with the patient, family and they are in agreement.  CODE STATUS: Full Code  TOTAL TIME TAKING CARE OF THIS PATIENT: 50 minutes.    Enid Baas M.D on 09/03/2015 at 12:33 PM  Between 7am to 6pm - Pager - [phone]  After 6pm go to [email or web address] - password [email or web address] [facility]  [place] [facility]  Office  [phone]  CC: Primary care physician; No PCP Per Patient

## 2015-09-03 NOTE — ED Provider Notes (Signed)
[facility] Emergency Department Provider Note  ____________________________________________  Time seen: Approximately 9:20 AM  I have reviewed the triage vital signs and the nursing notes.   HISTORY  Chief Complaint Foot Pain    HPI Jerene PitchBarney L Gottlieb is a 55 y.o. male with history of anemia, drug abuse, 2 months status post amputation of the left great toe for sepsis due to gas gangrene of the toe who presents for evaluation of drainage and discoloration of the left great toe, constant for 1 week, worsening, currently severe. No fevers or chills, no vomiting or diarrhea, no chest pain or difficulty breathing. Patient reports that after his amputation months ago, performed by podiatry, he never followed up for repeat evaluation and he "stop taking care of my toe".   History reviewed. No pertinent past medical history.  Patient Active Problem List   Diagnosis Date Noted  . Hypokalemia 07/14/2015  . Leukocytosis 07/14/2015  . Anemia of chronic disease 07/14/2015  . Tobacco abuse 07/14/2015  . SIRS (systemic inflammatory response syndrome) (HCC) 07/11/2015  . 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    Past Surgical History  Procedure Laterality Date  . Irrigation and debridement foot Left 07/12/2015    Procedure: IRRIGATION AND DEBRIDEMENT FOOT;  Surgeon: Gwyneth RevelsJustin Fowler, DPM;  Location: [facility] ORS;  Service: Podiatry;  Laterality: Left;  . Amputation toe Left 07/12/2015    Procedure: AMPUTATION TOE;  Surgeon: Gwyneth RevelsJustin Fowler, DPM;  Location: [facility] ORS;  Service: Podiatry;  Laterality: Left;    Current Outpatient Rx  Name  Route  Sig  Dispense  Refill  . amoxicillin-clavulanate (AUGMENTIN) 875-125 MG tablet   Oral   Take 1 tablet by mouth 2 (two) times daily. Patient not taking: Reported on 09/03/2015   20 tablet   2   . docusate sodium (COLACE) 100 MG capsule   Oral   Take 1 capsule (100 mg total) by mouth 2 (two) times daily. Patient not taking: Reported on  09/03/2015   10 capsule   0   . HYDROcodone-acetaminophen (NORCO/VICODIN) 5-325 MG tablet   Oral   Take 1-2 tablets by mouth every 4 (four) hours as needed for moderate pain. Patient not taking: Reported on 09/03/2015   30 tablet   0   . naproxen (EC NAPROSYN) 500 MG EC tablet   Oral   Take 1 tablet (500 mg total) by mouth 2 (two) times daily with a meal. Patient not taking: Reported on 07/11/2015   30 tablet   0   . nicotine polacrilex (NICORETTE) 2 MG gum   Oral   Take 1 each (2 mg total) by mouth as needed for smoking cessation. Patient not taking: Reported on 09/03/2015   100 tablet   0     Allergies Review of patient's allergies indicates no known allergies.  History reviewed. No pertinent family history.  Social History Social History  Substance Use Topics  . Smoking status: Current Every Day Smoker -- 0.50 packs/day    Types: Cigarettes  . Smokeless tobacco: None  . Alcohol Use: Yes    Review of Systems Constitutional: No fever/chills Eyes: No visual changes. ENT: No sore throat. Cardiovascular: Denies chest pain. Respiratory: Denies shortness of breath. Gastrointestinal: No abdominal pain.  No nausea, no vomiting.  No diarrhea.  No constipation. Genitourinary: Negative for dysuria. Musculoskeletal: Negative for back pain. Skin: Negative for rash. Neurological: Negative for headaches, focal weakness or numbness.  10-point ROS otherwise negative.  ____________________________________________   PHYSICAL EXAM:  VITAL SIGNS: ED  Triage Vitals  Enc Vitals Group     BP 09/03/15 0843 149/64 mmHg     Pulse Rate 09/03/15 0843 103     Resp 09/03/15 0843 18     Temp 09/03/15 0843 97.7 F (36.5 C)     Temp Source 09/03/15 0843 Oral     SpO2 09/03/15 0843 100 %     Weight 09/03/15 0843 242 lb (109.77 kg)     Height 09/03/15 0843 6\' 4"  (1.93 m)     Head Cir --      Peak Flow --      Pain Score 09/03/15 0844 7     Pain Loc --      Pain Edu? --      Excl.  in GC? --     Constitutional: Alert and oriented. Well appearing and in no acute distress. Eyes: Conjunctivae are normal. PERRL. EOMI. Head: Atraumatic. Nose: No congestion/rhinnorhea. Mouth/Throat: Mucous membranes are moist.  Oropharynx non-erythematous. Neck: No stridor.   Cardiovascular: Normal rate, regular rhythm. Grossly normal heart sounds.  Good peripheral circulation. Respiratory: Normal respiratory effort.  No retractions. Lungs CTAB. Gastrointestinal: Soft and nontender. No distention. No CVA tenderness. Genitourinary: deferred Musculoskeletal: There is an open wounds associated with the stump of the left great toe with associated purulent foul-smelling drainage. There are 2 nylon stitches in place. 2+ left DP pulse. Neurologic:  Normal speech and language. No gross focal neurologic deficits are appreciated. No gait instability. Skin:  Skin is warm, dry and intact. No rash noted. Psychiatric: Mood and affect are normal. Speech and behavior are normal.  ____________________________________________   LABS (all labs ordered are listed, but only abnormal results are displayed)  Labs Reviewed  CBC WITH DIFFERENTIAL/PLATELET - Abnormal; Notable for the following:    RBC 3.87 (*)    HCT 38.7 (*)    Lymphs Abs 0.9 (*)    All other components within normal limits  BASIC METABOLIC PANEL - Abnormal; Notable for the following:    Potassium 3.3 (*)    CO2 21 (*)    All other components within normal limits  CULTURE, BLOOD (ROUTINE X 2)  CULTURE, BLOOD (ROUTINE X 2)   ____________________________________________  EKG  none ____________________________________________  RADIOLOGY  Xray left foot IMPRESSION: Status post amputation of left first toe.There is significant soft tissue swelling at amputation site probable due to infection or cellulitis. Subtle cortical irregularity and focal osteopenia distal aspect of first metatarsal. This is highly suspicious  for osteomyelitis. Clinical correlation is necessary. Further correlation with MRI is recommended as clinically warranted.   ____________________________________________   PROCEDURES  Procedure(s) performed: None  Critical Care performed: No  ____________________________________________   INITIAL IMPRESSION / ASSESSMENT AND PLAN / ED COURSE  Pertinent labs & imaging results that were available during my care of the patient were reviewed by me and considered in my medical decision making (see chart for details).  BRAYLYN KALTER is a 55 y.o. male with history of anemia, drug abuse, 2 months status post amputation of the left great toe for sepsis due to gas gangrene of the toe presenting with foul-smelling drainage from the stump. On exam, he does not appear in any acute distress. Mildly tachycardic but vital signs otherwise stable, he is afebrile. There is an open wound associated with the stump of the left great toe with associated purulent foul-smelling drainage. Concern is for continued infection, at minimum soft tissue infection, possibly osteomyelitis. There are 2 nylon stitches in place which were  put in 2 months ago, they were removed in the emergency department. Plan for screening labs, we'll give vancomycin and Zosyn, obtain x-ray and anticipate admission.  ----------------------------------------- 11:29 AM on 09/03/2015 ----------------------------------------- Labs reviewed. CBC and BMP are generally unremarkable. Left foot x-rays concerning for osteomyelitis. Case discussed with hospitalist, Dr. Letitia Libra, for admission at this time.    ____________________________________________   FINAL CLINICAL IMPRESSION(S) / ED DIAGNOSES  Final diagnoses:  Left foot infection  Osteomyelitis of ankle or foot, acute, left (HCC)      Gayla Doss, MD 09/03/15 1129

## 2015-09-03 NOTE — Care Management Note (Addendum)
Case Management Note  Patient Details  Name: [name] [name] MRN: [id number] Date of Birth: 09/02/1960  Subjective/Objective:        54yo Mr [name] [name] was admitted 09/03/15 with left foot osteomyelitis. Had a great toe amputation in Nov 2016. Resides with his brother [name] [name] PCP=was [facility] during his last hospitalization. Will need a re-referral. Pharmacy=[facility]. Drives self to appointments. Was referred to [facility] for home health in Nov 2016. No home oxygen and no current home health services. Case management will follow for discharge planning.              Action/Plan:   Expected Discharge Date:                  Expected Discharge Plan:     In-House Referral:     Discharge planning Services     Post Acute Care Choice:    Choice offered to:     DME Arranged:    DME Agency:     HH Arranged:    HH Agency:     Status of Service:     Medicare Important Message Given:    Date Medicare IM Given:    Medicare IM give by:    Date Additional Medicare IM Given:    Additional Medicare Important Message give by:     If discussed at Long Length of Stay Meetings, dates discussed:    Additional Comments:  Bonita Brindisi A, RN 09/03/2015, 2:25 PM

## 2015-09-03 NOTE — Consult Note (Signed)
Patient Demographics  [name] [name], is a 55 y.o. male   MRN: [id number]   DOB - [birth year]  Admit Date - 09/03/2015    Outpatient Primary MD for the patient is No PCP Per Patient  Consult requested in the Hospital by Enid Baas, MD, On 09/03/2015    Reason for consult draining open wound from amputation site of left great toe from 2 months ago.   With History of -  Past Medical History  Diagnosis Date  . Toe gangrene (HCC)   . Tobacco use disorder   . Anemia of chronic disease       Past Surgical History  Procedure Laterality Date  . Irrigation and debridement foot Left 07/12/2015    Procedure: IRRIGATION AND DEBRIDEMENT FOOT;  Surgeon: Gwyneth Revels, DPM;  Location: [facility] ORS;  Service: Podiatry;  Laterality: Left;  . Amputation toe Left 07/12/2015    Procedure: AMPUTATION TOE;  Surgeon: Gwyneth Revels, DPM;  Location: [facility] ORS;  Service: Podiatry;  Laterality: Left;    in for   Chief Complaint  Patient presents with  . Foot Pain     HPI  [name] [name]  is a 55 y.o. male, patient underwent a left great toe amputation about one half months ago by my partner Dr. Ether Griffins. The patient failed to follow-up in the office for postoperative care. He presented into the emergency department today with draining and infection to the amputation site. Sutures apparently were still present in the wound site and were removed by the ED physician   Social History Social History  Substance Use Topics  . Smoking status: Current Every Day Smoker -- 0.50 packs/day    Types: Cigarettes  . Smokeless tobacco: Not on file  . Alcohol Use: No     Family History Family History  Problem Relation Age of Onset  . CVA Mother   . CVA Brother   . Diabetes Mellitus II Father     Prior to Admission medications    Medication Sig Start Date End Date Taking? Authorizing Provider  amoxicillin-clavulanate (AUGMENTIN) 875-125 MG tablet Take 1 tablet by mouth 2 (two) times daily. Patient not taking: Reported on 09/03/2015 07/14/15   Katharina Caper, MD  docusate sodium (COLACE) 100 MG capsule Take 1 capsule (100 mg total) by mouth 2 (two) times daily. Patient not taking: Reported on 09/03/2015 07/14/15   Katharina Caper, MD  HYDROcodone-acetaminophen (NORCO/VICODIN) 5-325 MG tablet Take 1-2 tablets by mouth every 4 (four) hours as needed for moderate pain. Patient not taking: Reported on 09/03/2015 07/14/15   Katharina Caper, MD  naproxen (EC NAPROSYN) 500 MG EC tablet Take 1 tablet (500 mg total) by mouth 2 (two) times daily with a meal. Patient not taking: Reported on 07/11/2015 05/23/15   Charlesetta Ivory Menshew, PA-C  nicotine polacrilex (NICORETTE) 2 MG gum Take 1 each (2 mg total) by mouth as needed for smoking cessation. Patient not taking: Reported on 09/03/2015 07/14/15   Katharina Caper, MD    Anti-infectives    Start     Dose/Rate Route Frequency Ordered Stop   09/03/15 1700  vancomycin (VANCOCIN) IVPB 1000 mg/200 mL premix     1,000 mg 200 mL/hr  over 60 Minutes Intravenous Every 8 hours 09/03/15 1450     09/03/15 1530  piperacillin-tazobactam (ZOSYN) IVPB 3.375 g     3.375 g 12.5 mL/hr over 240 Minutes Intravenous 3 times per day 09/03/15 1450     09/03/15 0945  piperacillin-tazobactam (ZOSYN) IVPB 3.375 g  Status:  Discontinued     3.375 g 12.5 mL/hr over 240 Minutes Intravenous  Once 09/03/15 0930 09/03/15 0930   09/03/15 0945  vancomycin (VANCOCIN) IVPB 1000 mg/200 mL premix     1,000 mg 200 mL/hr over 60 Minutes Intravenous  Once 09/03/15 0930 09/03/15 1151   09/03/15 0930  clindamycin (CLEOCIN) IVPB 600 mg  Status:  Discontinued     600 mg 100 mL/hr over 30 Minutes Intravenous  Once 09/03/15 0925 09/03/15 0930   09/03/15 0930  piperacillin-tazobactam (ZOSYN) IVPB 3.375 g     3.375 g 12.5 mL/hr over  240 Minutes Intravenous  Once 09/03/15 0925 09/03/15 1052      Scheduled Meds: . docusate sodium  100 mg Oral BID  . enoxaparin (LOVENOX) injection  40 mg Subcutaneous Q24H  . nicotine  14 mg Transdermal Daily  . piperacillin-tazobactam (ZOSYN)  IV  3.375 g Intravenous 3 times per day  . vancomycin  1,000 mg Intravenous Q8H   Continuous Infusions: . sodium chloride     PRN Meds:.acetaminophen **OR** acetaminophen, HYDROcodone-acetaminophen, ondansetron **OR** ondansetron (ZOFRAN) IV, polyethylene glycol  No Known Allergies  Physical Exam  Vitals  Blood pressure 141/91, pulse 69, temperature 98.7 F (37.1 C), temperature source Axillary, resp. rate 18, height 6\' 4"  (1.93 m), weight 106.641 kg (235 lb 1.6 oz), SpO2 100 %.  Lower Extremity exam:  Vascular: Left foot DP pulses palpable at +1 over 4. PT pulses palpable. Patient has a chronic history of smoking and likely has some small vessel disease.  Dermatological: Patient has a draining wound approximately 1.2 cm long and 3-4 mm wide. Some fluctuance at the end of the metatarsal at the amputation site previously. Some  purulent drainage with compression of the wound.  Neurological: Patient has peripheral neuropathy apparently. This wound does not hurt him and it all. He states he has a history of numbness to his feet. He is not diabetic at this point.  Ortho: Previous amputation to the left great toe at the MTPJ level. Currently likely has osteomyelitis based on MRI and x-ray results. MRI and x-ray both indicate likely osteomyelitis to the first metatarsal head. This area of edema in the bone extends up to the midshaft region of the first metatarsal.  Data Review  CBC  Recent Labs Lab 09/03/15 0927  WBC 4.7  HGB 13.0  HCT 38.7*  PLT 257  MCV 99.8  MCH 33.7  MCHC 33.7  RDW 13.6  LYMPHSABS 0.9*  MONOABS 0.6  EOSABS 0.4  BASOSABS 0.0    ------------------------------------------------------------------------------------------------------------------  Chemistries   Recent Labs Lab 09/03/15 0927  NA 139  K 3.3*  CL 106  CO2 21*  GLUCOSE 98  BUN 6  CREATININE 0.65  CALCIUM 9.2   ------------------------------------------------------------------------------------------------------------------ estimated creatinine clearance is 138.1 mL/min (by C-G formula based on Cr of 0.65). ------------------------------------------------------------------------------------------------------------------ No results for input(s): TSH, T4TOTAL, T3FREE, THYROIDAB in the last 72 hours.  Invalid input(s): FREET3   Coagulation profile No results for input(s): INR, PROTIME in the last 168 hours. ------------------------------------------------------------------------------------------------------------------- No results for input(s): DDIMER in the last 72 hours. -------------------------------------------------------------------------------------------------------------------  Cardiac Enzymes No results for input(s): CKMB, TROPONINI, MYOGLOBIN in the last 168 hours.  Invalid input(s): CK ------------------------------------------------------------------------------------------------------------------ Invalid input(s): POCBNP   ---------------------------------------------------------------------------------------------------------------  Urinalysis    Component Value Date/Time   COLORURINE COLORLESS* 09/03/2015 1234   APPEARANCEUR CLEAR* 09/03/2015 1234   LABSPEC 1.001* 09/03/2015 1234   PHURINE 5.0 09/03/2015 1234   GLUCOSEU NEGATIVE 09/03/2015 1234   HGBUR NEGATIVE 09/03/2015 1234   BILIRUBINUR NEGATIVE 09/03/2015 1234   KETONESUR NEGATIVE 09/03/2015 1234   PROTEINUR NEGATIVE 09/03/2015 1234   NITRITE NEGATIVE 09/03/2015 1234   LEUKOCYTESUR NEGATIVE 09/03/2015 1234     Imaging results:   Mr Foot Left W Wo  Contrast  09/03/2015  CLINICAL DATA:  Open draining wound along the dorsal aspect of the amputated great toe. History of an amputation 2 months ago. EXAM: MRI OF THE LEFT FOREFOOT WITHOUT AND WITH CONTRAST TECHNIQUE: Multiplanar, multisequence MR imaging was performed both before and after administration of intravenous contrast. CONTRAST:  20mL MULTIHANCE GADOBENATE DIMEGLUMINE 529 MG/ML IV SOLN COMPARISON:  Radiographs 09/03/2015 FINDINGS: There is a rim enhancing abscess extending from the first metacarpal head to the skin surface along the dorsum of the great toe. This measures approximately 16 x 10 mm. There is surrounding cellulitis and there is osteomyelitis involving the first metatarsal head and mid distal shaft. The other bony structures are intact. Midfoot degenerative changes but no other areas of osteomyelitis are identified. There is diffuse myositis and cellulitis. IMPRESSION: 1. Osteomyelitis involving the first metatarsal head and mid distal shaft. 2. Soft tissue abscess extending from the head of the first metatarsal to the skin dorsally. 3. Diffuse cellulitis and myositis. Electronically Signed   By: Rudie Meyer M.D.   On: 09/03/2015 15:47   Dg Foot Complete Left  09/03/2015  CLINICAL DATA:  Status post left great toe amputation at Thanksgiving, left foot pain and swelling, purulent drainage shin EXAM: LEFT FOOT - COMPLETE 3+ VIEW COMPARISON:  07/11/2015 FINDINGS: Three views of the left foot submitted the patient is status post amputation of great toe. There is significant soft tissue swelling at amputation site probable due to infection or cellulitis. Subtle cortical irregularity and focal osteopenia distal aspect of first metatarsal. This is highly suspicious for osteomyelitis. Clinical correlation is necessary. Further correlation with MRI is recommended as clinically warranted. IMPRESSION: Status post amputation of left first toe.There is significant soft tissue swelling at amputation site  probable due to infection or cellulitis. Subtle cortical irregularity and focal osteopenia distal aspect of first metatarsal. This is highly suspicious for osteomyelitis. Clinical correlation is necessary. Further correlation with MRI is recommended as clinically warranted. Electronically Signed   By: Natasha Mead M.D.   On: 09/03/2015 10:21    Assessment & Plan: Osteomyelitis first metatarsal head and distal shaft left foot. Patient was very noncompliant with his last amputation and [place] came back for any type of postoperative care. Never had the sutures removed even until he showed up in the ER today. Plan: I discussed the options with the patient. I explained that infected bone tends to stay infected even with 4-6 weeks of IV antibiotics. Oftentimes antibiotics will suppress the problem but it will resurface when antibiotics are discontinued. A better cure is resection of the infected bone followed by IV antibiotics. We'll get him scheduled for the operating room tomorrow to resect the first metatarsal head and debride infected skin and soft tissue and bone. I will likely put someStimulan beads with gentamicin and vancomycin and those areas as well.  Active Problems:   Osteomyelitis [facility])  Family Communication: Plan discussed with patient .  Epimenio Sarin M.D on 09/03/2015 at 5:32 PM  Thank you for the consult, we will follow the patient with you in the Hospital.

## 2015-09-03 NOTE — ED Notes (Signed)
Received pt from [facility], pt had left foot great toe amputated in late November , foul smell, yellowish thick drainage noted. Left knee swelling x3 days no injury

## 2015-09-03 NOTE — ED Notes (Signed)
Pt states left great toe was amputates at thanskgiving and he has not been taking care of it, left foot swelling and odor upon assessment, purulent drainage and tenderness

## 2015-09-03 NOTE — Progress Notes (Signed)
ANTIBIOTIC CONSULT NOTE - INITIAL  Pharmacy Consult for vancomycin & piperacillin/tazobactam Indication: Osteomyelitis  No Known Allergies  Patient Measurements: Height: 6\' 4"  (193 cm) Weight: 235 lb 1.6 oz (106.641 kg) IBW/kg (Calculated) : 86.8 Adjusted Body Weight: 94 kg  Vital Signs: Temp: 98.1 F (36.7 C) (01/06 1333) Temp Source: Oral (01/06 1333) BP: 147/75 mmHg (01/06 1333) Pulse Rate: 66 (01/06 1333)  Labs:  Recent Labs  09/03/15 0927  WBC 4.7  HGB 13.0  PLT 257  CREATININE 0.65   Estimated Creatinine Clearance: 138.1 mL/min (by C-G formula based on Cr of 0.65). No results for input(s): VANCOTROUGH, VANCOPEAK, VANCORANDOM, GENTTROUGH, GENTPEAK, GENTRANDOM, TOBRATROUGH, TOBRAPEAK, TOBRARND, AMIKACINPEAK, AMIKACINTROU, AMIKACIN in the last 72 hours.   Microbiology: No results found for this or any previous visit (from the past 720 hour(s)).  Medical History: Past Medical History  Diagnosis Date  . Toe gangrene (HCC)   . Tobacco use disorder   . Anemia of chronic disease    Assessment: Pharmacy consulted to dose vancomycin and piperacillin/tazobactam in this 55 year old male who was seen here ~2 months ago with left great toe pain and subsequently underwent toe amputation. Purulent discharge at site where sutures were removed and x-ray shows possible OM.   Kinetics Ke: 0.087 Half-life: 7.97 hrs Vd: 65L C min (calculated): ~15 mcg/mL  Adjusted body weight = 94 kg CrCl = ~100 mL/min  Goal of Therapy:  Vancomycin trough level 15-20 mcg/ml  Plan:  Measure antibiotic drug levels at steady state Follow up culture results   Start piperacillin/tazobactam 3.375 g IV q 8 hours EI   Vancomycin 1 g was given ~1100 today Will start maintenance dose of vancomycin 1000 mg IV q 8 hours (with stacked dose to begin 6 hours after initial dose at 1700 tonight) Vancomycin trough ordered for 1/7 at 1630, which is prior to the 5th dose and should represent steady state.  Estimated vancomycin trough ~15 mcg/mL, however, patient is at risk for accumulation.  Pharmacy will continue to monitor, thank you for the consult.  Cindi CarbonMary M Everline Mahaffy  Clinical Pharmacist 09/03/2015,2:52 PM

## 2015-09-03 NOTE — ED Notes (Addendum)
Reports pain in left foot.  Pt had big toe amputated last year. Ambulates well with mild limp to triage

## 2015-09-03 NOTE — Consult Note (Signed)
[facility]     Reason for Consult: Foot ulcer and osteomyelitis   Referring Physician: Claria Dice Date of Admission:  09/03/2015   Active Problems:   Osteomyelitis ([place])   HPI: [name] [name] is a 55 y.o. male with hx tobacco abuse, with recent admission to the hospital 2 months ago for left great toe pain and swelling. At that time had toe amputated by podiatry and dced on augmentin. Did not follow up after dc.  Readmitted with increased pain, swelling and foul smelling discharge from the site. IN Ed he was noted to  still had the sutures from his debridement and amputation 2 months ago. Sutures were taken out in the emergency room by the ER physician.  x-ray shows possible changes of osteomyelitis. He was dced on 11/16 on 2 weeks augmentin. Cx from surgery grew Nml flora. MRSA PCR was negative  Past Medical History  Diagnosis Date  . 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 Tobacco use disorder   . Anemia of chronic disease    Past Surgical History  Procedure Laterality Date  . Irrigation and debridement foot Left 07/12/2015    Procedure: IRRIGATION AND DEBRIDEMENT FOOT;  Surgeon: Samara Deist, DPM;  Location: [facility] ORS;  Service: Podiatry;  Laterality: Left;  . Amputation toe Left 07/12/2015    Procedure: AMPUTATION TOE;  Surgeon: Samara Deist, DPM;  Location: [facility] ORS;  Service: Podiatry;  Laterality: Left;   Social History  Substance Use Topics  . Smoking status: Current Every Day Smoker -- 0.50 packs/day    Types: Cigarettes  . Smokeless tobacco: None  . Alcohol Use: No   Family History  Problem Relation Age of Onset  . CVA Mother   . CVA Brother   . Diabetes Mellitus II Father     Allergies: No Known Allergies  Current antibiotics: Antibiotics Given (last 72 hours)    Date/Time Action Medication Dose Rate   09/03/15 1748 Given   vancomycin (VANCOCIN) IVPB 1000 mg/200 mL premix 1,000 mg 200 mL/hr   09/03/15 1749 Given   piperacillin-tazobactam (ZOSYN) IVPB  3.375 g 3.375 g 12.5 mL/hr      MEDICATIONS: . docusate sodium  100 mg Oral BID  . enoxaparin (LOVENOX) injection  40 mg Subcutaneous Q24H  . nicotine  14 mg Transdermal Daily  . piperacillin-tazobactam (ZOSYN)  IV  3.375 g Intravenous 3 times per day  . vancomycin  1,000 mg Intravenous Q8H    Review of Systems - 11 systems reviewed and negative per HPI   OBJECTIVE: Temp:  [97.7 F (36.5 C)-98.7 F (37.1 C)] 98.7 F (37.1 C) (01/06 1621) Pulse Rate:  [66-103] 69 (01/06 1621) Resp:  [16-18] 18 (01/06 1621) BP: (122-149)/(64-91) 141/91 mmHg (01/06 1621) SpO2:  [97 %-100 %] 100 % (01/06 1621) Weight:  [106.641 kg (235 lb 1.6 oz)-109.77 kg (242 lb)] 106.641 kg (235 lb 1.6 oz) (01/06 1354) Physical Exam  GENERAL: 55 y.o.-year-old patient lying in the bed with no acute distress.  EYES: Pupils equal, round, reactive to light and accommodation. No scleral icterus. Extraocular muscles intact.  HEENT: Head atraumatic, normocephalic. Oropharynx and nasopharynx clear.  NECK: Supple, no jugular venous distention. No thyroid enlargement, no tenderness.  LUNGS: Normal breath sounds bilaterally, no wheezing, rales,rhonchi or crepitation. No use of accessory muscles of respiration.  CARDIOVASCULAR: S1, S2 normal. No murmurs, rubs, or gallops.  ABDOMEN: Soft, nontender, nondistended. Bowel sounds present. No organomegaly or mass.  EXTREMITIES: No cyanosis, or clubbing. Toes appear dry, left  foot s/p first toe amputation with swelling at the stump site and foul smelling discharge at the site. NEUROLOGIC: Cranial nerves II through XII are intact. Muscle strength 5/5 in all extremities. Sensation intact. Gait not checked.  PSYCHIATRIC: The patient is alert and oriented x 3.  SKIN: No obvious rash, lesion, or ulcer.    LABS: Results for orders placed or performed during the hospital encounter of 09/03/15 (from the past 48 hour(s))  CBC with Differential     Status: Abnormal    Collection Time: 09/03/15  9:27 AM  Result Value Ref Range   WBC 4.7 3.8 - 10.6 K/uL   RBC 3.87 (L) 4.40 - 5.90 MIL/uL   Hemoglobin 13.0 13.0 - 18.0 g/dL   HCT 38.7 (L) 40.0 - 52.0 %   MCV 99.8 80.0 - 100.0 fL   MCH 33.7 26.0 - 34.0 pg   MCHC 33.7 32.0 - 36.0 g/dL   RDW 13.6 11.5 - 14.5 %   Platelets 257 150 - 440 K/uL   Neutrophils Relative % 61 %   Neutro Abs 2.9 1.4 - 6.5 K/uL   Lymphocytes Relative 18 %   Lymphs Abs 0.9 (L) 1.0 - 3.6 K/uL   Monocytes Relative 12 %   Monocytes Absolute 0.6 0.2 - 1.0 K/uL   Eosinophils Relative 8 %   Eosinophils Absolute 0.4 0 - 0.7 K/uL   Basophils Relative 1 %   Basophils Absolute 0.0 0 - 0.1 K/uL  Basic metabolic panel     Status: Abnormal   Collection Time: 09/03/15  9:27 AM  Result Value Ref Range   Sodium 139 135 - 145 mmol/L   Potassium 3.3 (L) 3.5 - 5.1 mmol/L   Chloride 106 101 - 111 mmol/L   CO2 21 (L) 22 - 32 mmol/L   Glucose, Bld 98 65 - 99 mg/dL   BUN 6 6 - 20 mg/dL   Creatinine, Ser 0.65 0.61 - 1.24 mg/dL   Calcium 9.2 8.9 - 10.3 mg/dL   GFR calc non Af Amer >60 >60 mL/min   GFR calc Af Amer >60 >60 mL/min    Comment: (NOTE) The eGFR has been calculated using the CKD EPI equation. This calculation has not been validated in all clinical situations. eGFR's persistently <60 mL/min signify possible Chronic Kidney Disease.    Anion gap 12 5 - 15  Urinalysis complete, with microscopic ([facility] only)     Status: Abnormal   Collection Time: 09/03/15 12:34 PM  Result Value Ref Range   Color, Urine COLORLESS (A) YELLOW   APPearance CLEAR (A) CLEAR   Glucose, UA NEGATIVE NEGATIVE mg/dL   Bilirubin Urine NEGATIVE NEGATIVE   Ketones, ur NEGATIVE NEGATIVE mg/dL   Specific Gravity, Urine 1.001 (L) 1.005 - 1.030   Hgb urine dipstick NEGATIVE NEGATIVE   pH 5.0 5.0 - 8.0   Protein, ur NEGATIVE NEGATIVE mg/dL   Nitrite NEGATIVE NEGATIVE   Leukocytes, UA NEGATIVE NEGATIVE   RBC / HPF NONE SEEN 0 - 5 RBC/hpf   WBC, UA NONE SEEN 0 - 5  WBC/hpf   Bacteria, UA NONE SEEN NONE SEEN   Squamous Epithelial / LPF NONE SEEN NONE SEEN  Urine Drug Screen, Qualitative ([facility] only)     Status: Abnormal   Collection Time: 09/03/15 12:34 PM  Result Value Ref Range   Tricyclic, Ur Screen NONE DETECTED NONE DETECTED   Amphetamines, Ur Screen NONE DETECTED NONE DETECTED   MDMA (Ecstasy)Ur Screen NONE DETECTED NONE DETECTED   Cocaine Metabolite,Ur  [place] POSITIVE (A) NONE DETECTED   Opiate, Ur Screen NONE DETECTED NONE DETECTED   Phencyclidine (PCP) Ur S NONE DETECTED NONE DETECTED   Cannabinoid 50 Ng, Ur  NONE DETECTED NONE DETECTED   Barbiturates, Ur Screen NONE DETECTED NONE DETECTED   Benzodiazepine, Ur Scrn NONE DETECTED NONE DETECTED   Methadone Scn, Ur NONE DETECTED NONE DETECTED    Comment: (NOTE) 027  Tricyclics, urine               Cutoff 1000 ng/mL 200  Amphetamines, urine             Cutoff 1000 ng/mL 300  MDMA (Ecstasy), urine           Cutoff 500 ng/mL 400  Cocaine Metabolite, urine       Cutoff 300 ng/mL 500  Opiate, urine                   Cutoff 300 ng/mL 600  Phencyclidine (PCP), urine      Cutoff 25 ng/mL 700  Cannabinoid, urine              Cutoff 50 ng/mL 800  Barbiturates, urine             Cutoff 200 ng/mL 900  Benzodiazepine, urine           Cutoff 200 ng/mL 1000 Methadone, urine                Cutoff 300 ng/mL 1100 1200 The urine drug screen provides only a preliminary, unconfirmed 1300 analytical test result and should not be used for non-medical 1400 purposes. Clinical consideration and professional judgment should 1500 be applied to any positive drug screen result due to possible 1600 interfering substances. A more specific alternate chemical method 1700 must be used in order to obtain a confirmed analytical result.  1800 Gas chromato graphy / mass spectrometry (GC/MS) is the preferred 1900 confirmatory method.    No components found for: ESR, C REACTIVE PROTEIN MICRO: No results found for this or any  previous visit (from the past 720 hour(s)).  IMAGING: Mr Foot Left W Wo Contrast  09/03/2015  CLINICAL DATA:  Open draining wound along the dorsal aspect of the amputated great toe. History of an amputation 2 months ago. EXAM: MRI OF THE LEFT FOREFOOT WITHOUT AND WITH CONTRAST TECHNIQUE: Multiplanar, multisequence MR imaging was performed both before and after administration of intravenous contrast. CONTRAST:  59m MULTIHANCE GADOBENATE DIMEGLUMINE 529 MG/ML IV SOLN COMPARISON:  Radiographs 09/03/2015 FINDINGS: There is a rim enhancing abscess extending from the first metacarpal head to the skin surface along the dorsum of the great toe. This measures approximately 16 x 10 mm. There is surrounding cellulitis and there is osteomyelitis involving the first metatarsal head and mid distal shaft. The other bony structures are intact. Midfoot degenerative changes but no other areas of osteomyelitis are identified. There is diffuse myositis and cellulitis. IMPRESSION: 1. Osteomyelitis involving the first metatarsal head and mid distal shaft. 2. Soft tissue abscess extending from the head of the first metatarsal to the skin dorsally. 3. Diffuse cellulitis and myositis. Electronically Signed   By: PMarijo SanesM.D.   On: 09/03/2015 15:47   Dg Foot Complete Left  09/03/2015  CLINICAL DATA:  Status post left great toe amputation at Thanksgiving, left foot pain and swelling, purulent drainage shin EXAM: LEFT FOOT - COMPLETE 3+ VIEW COMPARISON:  07/11/2015 FINDINGS: Three views of the left foot submitted the patient is status post  amputation of great toe. There is significant soft tissue swelling at amputation site probable due to infection or cellulitis. Subtle cortical irregularity and focal osteopenia distal aspect of first metatarsal. This is highly suspicious for osteomyelitis. Clinical correlation is necessary. Further correlation with MRI is recommended as clinically warranted. IMPRESSION: Status post amputation of  left first toe.There is significant soft tissue swelling at amputation site probable due to infection or cellulitis. Subtle cortical irregularity and focal osteopenia distal aspect of first metatarsal. This is highly suspicious for osteomyelitis. Clinical correlation is necessary. Further correlation with MRI is recommended as clinically warranted. Electronically Signed   By: Lahoma Crocker M.D.   On: 09/03/2015 10:21    Assessment:   [name] [name] is a 55 y.o. male with tobacco abuse, cocaine abuse, admitted with drainage and pain in great toe at prior amputation site. He had failed to follow up after his last surgery. MRI shows osteomyelitis. Prior cx had grown nml skin flora.   Recommendations Agree with surgical resection of infected bone.  Continue vanco and zosyn  Check ESR CRP Will be a poor PICC line candidate given hx substance abuse and noncompliance - unless he goes to a SNF. Thank you very much for allowing me to participate in the care of this patient. Please call with questions.   Cheral Marker. Ola Spurr, MD

## 2015-09-04 ENCOUNTER — Encounter: Payer: Self-pay | Admitting: Anesthesiology

## 2015-09-04 LAB — CBC
HCT: 38.1 % — ABNORMAL LOW (ref 40.0–52.0)
Hemoglobin: 12.9 g/dL — ABNORMAL LOW (ref 13.0–18.0)
MCH: 34.4 pg — ABNORMAL HIGH (ref 26.0–34.0)
MCHC: 33.8 g/dL (ref 32.0–36.0)
MCV: 101.7 fL — ABNORMAL HIGH (ref 80.0–100.0)
PLATELETS: 234 10*3/uL (ref 150–440)
RBC: 3.75 MIL/uL — AB (ref 4.40–5.90)
RDW: 13.4 % (ref 11.5–14.5)
WBC: 4.2 10*3/uL (ref 3.8–10.6)

## 2015-09-04 LAB — URINE DRUG SCREEN, QUALITATIVE (ARMC ONLY)
Amphetamines, Ur Screen: NOT DETECTED
BARBITURATES, UR SCREEN: NOT DETECTED
BENZODIAZEPINE, UR SCRN: NOT DETECTED
CANNABINOID 50 NG, UR ~~LOC~~: NOT DETECTED
Cocaine Metabolite,Ur ~~LOC~~: POSITIVE — AB
MDMA (ECSTASY) UR SCREEN: NOT DETECTED
Methadone Scn, Ur: NOT DETECTED
Opiate, Ur Screen: NOT DETECTED
PHENCYCLIDINE (PCP) UR S: NOT DETECTED
TRICYCLIC, UR SCREEN: NOT DETECTED

## 2015-09-04 LAB — BASIC METABOLIC PANEL
ANION GAP: 5 (ref 5–15)
BUN: 7 mg/dL (ref 6–20)
CHLORIDE: 107 mmol/L (ref 101–111)
CO2: 27 mmol/L (ref 22–32)
Calcium: 8.9 mg/dL (ref 8.9–10.3)
Creatinine, Ser: 0.9 mg/dL (ref 0.61–1.24)
GFR calc Af Amer: >60 mL/min (ref >60)
GFR calc non Af Amer: >60 mL/min (ref >60)
GLUCOSE: 102 mg/dL — AB (ref 65–99)
POTASSIUM: 3.7 mmol/L (ref 3.5–5.1)
SODIUM: 139 mmol/L (ref 135–145)

## 2015-09-04 LAB — VANCOMYCIN, TROUGH: Vancomycin Tr: 13 ug/mL (ref 10–20)

## 2015-09-04 LAB — C-REACTIVE PROTEIN: CRP: 3.6 mg/dL — ABNORMAL HIGH (ref <1.0)

## 2015-09-04 LAB — SEDIMENTATION RATE: SED RATE: 26 mm/h — AB (ref 0–20)

## 2015-09-04 MED ORDER — VANCOMYCIN HCL 1000 MG IV SOLR
INTRAVENOUS | Status: AC
  Filled 2015-09-04: qty 1000

## 2015-09-04 MED ORDER — GENTAMICIN SULFATE 40 MG/ML IJ SOLN
INTRAMUSCULAR | Status: AC
  Filled 2015-09-04: qty 2

## 2015-09-04 MED ORDER — NICOTINE POLACRILEX 2 MG MT GUM
2.0000 mg | CHEWING_GUM | OROMUCOSAL | Status: DC | PRN
  Filled 2015-09-04: qty 1

## 2015-09-04 MED ORDER — LIDOCAINE HCL (PF) 1 % IJ SOLN
INTRAMUSCULAR | Status: AC
  Filled 2015-09-04: qty 30

## 2015-09-04 MED ORDER — BUPIVACAINE HCL (PF) 0.5 % IJ SOLN
INTRAMUSCULAR | Status: AC
  Filled 2015-09-04: qty 30

## 2015-09-04 MED ORDER — POTASSIUM CHLORIDE IN NACL 20-0.9 MEQ/L-% IV SOLN
INTRAVENOUS | Status: DC
  Administered 2015-09-04 – 2015-09-07 (×5): via INTRAVENOUS
  Filled 2015-09-04 (×9): qty 1000

## 2015-09-04 NOTE — Progress Notes (Signed)
Patient Demographics  [name] [name], is a 55 y.o. male   MRN: [id number]   DOB - [birth year]  Admit Date - 09/03/2015    Outpatient Primary MD for the patient is No PCP Per Patient  With History of -  Past Medical History  Diagnosis Date  . Toe gangrene ([facility])   . Tobacco use disorder   . Anemia of chronic disease       Past Surgical History  Procedure Laterality Date  . Irrigation and debridement foot Left 07/12/2015    Procedure: IRRIGATION AND DEBRIDEMENT FOOT;  Surgeon: Gwyneth Revels, DPM;  Location: [facility] ORS;  Service: Podiatry;  Laterality: Left;  . Amputation toe Left 07/12/2015    Procedure: AMPUTATION TOE;  Surgeon: Gwyneth Revels, DPM;  Location: [facility] ORS;  Service: Podiatry;  Laterality: Left;    in for   Chief Complaint  Patient presents with  . Foot Pain     HPI  [name] [name]  is a 55 y.o. male, amputation of left great toe about a month and a half ago patient failed to return for any follow-up visits. Showed up in the emergency room on January 6 yesterday with draining swelling to the amputation site. Was admitted for IV antibiotics and consultations. Subsequent x-rays and MRIs showed likelihood of osteomyelitis to the first metatarsal head.    Social History Social History  Substance Use Topics  . Smoking status: Current Every Day Smoker -- 0.50 packs/day    Types: Cigarettes  . Smokeless tobacco: Not on file  . Alcohol Use: No    Family History Family History  Problem Relation Age of Onset  . CVA Mother   . CVA Brother   . Diabetes Mellitus II Father     Prior to Admission medications   Medication Sig Start Date End Date Taking? Authorizing Provider  amoxicillin-clavulanate (AUGMENTIN) 875-125 MG tablet Take 1 tablet by mouth 2 (two) times daily. Patient not taking: Reported  on 09/03/2015 07/14/15   Katharina Caper, MD  docusate sodium (COLACE) 100 MG capsule Take 1 capsule (100 mg total) by mouth 2 (two) times daily. Patient not taking: Reported on 09/03/2015 07/14/15   Katharina Caper, MD  HYDROcodone-acetaminophen (NORCO/VICODIN) 5-325 MG tablet Take 1-2 tablets by mouth every 4 (four) hours as needed for moderate pain. Patient not taking: Reported on 09/03/2015 07/14/15   Katharina Caper, MD  naproxen (EC NAPROSYN) 500 MG EC tablet Take 1 tablet (500 mg total) by mouth 2 (two) times daily with a meal. Patient not taking: Reported on 07/11/2015 05/23/15   Charlesetta Ivory Menshew, PA-C  nicotine polacrilex (NICORETTE) 2 MG gum Take 1 each (2 mg total) by mouth as needed for smoking cessation. Patient not taking: Reported on 09/03/2015 07/14/15   Katharina Caper, MD    Anti-infectives    Start     Dose/Rate Route Frequency Ordered Stop   09/03/15 1700  vancomycin (VANCOCIN) IVPB 1000 mg/200 mL premix     1,000 mg 200 mL/hr over 60 Minutes Intravenous Every 8 hours 09/03/15 1450     09/03/15 1530  piperacillin-tazobactam (ZOSYN) IVPB 3.375 [name]     3.375 [name] 12.5 mL/hr over 240 Minutes Intravenous 3 times  per day 09/03/15 1450     09/03/15 0945  piperacillin-tazobactam (ZOSYN) IVPB 3.375 [name]  Status:  Discontinued     3.375 [name] 12.5 mL/hr over 240 Minutes Intravenous  Once 09/03/15 0930 09/03/15 0930   09/03/15 0945  vancomycin (VANCOCIN) IVPB 1000 mg/200 mL premix     1,000 mg 200 mL/hr over 60 Minutes Intravenous  Once 09/03/15 0930 09/03/15 1151   09/03/15 0930  clindamycin (CLEOCIN) IVPB 600 mg  Status:  Discontinued     600 mg 100 mL/hr over 30 Minutes Intravenous  Once 09/03/15 0925 09/03/15 0930   09/03/15 0930  piperacillin-tazobactam (ZOSYN) IVPB 3.375 [name]     3.375 [name] 12.5 mL/hr over 240 Minutes Intravenous  Once 09/03/15 0925 09/03/15 1052      Scheduled Meds: . docusate sodium  100 mg Oral BID  . enoxaparin (LOVENOX) injection  40 mg Subcutaneous Q24H  . nicotine  14  mg Transdermal Daily  . piperacillin-tazobactam (ZOSYN)  IV  3.375 [name] Intravenous 3 times per day  . vancomycin  1,000 mg Intravenous Q8H   Continuous Infusions: . sodium chloride 100 mL/hr at 09/04/15 0514   PRN Meds:.acetaminophen **OR** acetaminophen, HYDROcodone-acetaminophen, ondansetron **OR** ondansetron (ZOFRAN) IV, polyethylene glycol  No Known Allergies  Physical Exam  Vitals  Blood pressure 113/71, pulse 76, temperature 97.4 F (36.3 C), temperature source Oral, resp. rate 18, height 6\' 4"  (1.93 m), weight 106.641 kg (235 lb 1.6 oz), SpO2 100 %.  Lower Extremity exam:  Vascular: DP PT pulses are +2 over 4 to the dorsalis pedis artery and +1 for the posterior tibial artery  Dermatological: Wound is covered and dressed this point.  Neurological: Patient apparently has some peripheral neuropathy and numbness with his feet even though he is not diabetic.  Ortho: Osteomyelitis to the first metatarsal head. Previous amputation of the great toe to the MTPJ level.  Data Review  CBC  Recent Labs Lab 09/03/15 0927 09/04/15 0311  WBC 4.7 4.2  HGB 13.0 12.9*  HCT 38.7* 38.1*  PLT 257 234  MCV 99.8 101.7*  MCH 33.7 34.4*  MCHC 33.7 33.8  RDW 13.6 13.4  LYMPHSABS 0.9*  --   MONOABS 0.6  --   EOSABS 0.4  --   BASOSABS 0.0  --    ------------------------------------------------------------------------------------------------------------------  Chemistries   Recent Labs Lab 09/03/15 0927 09/04/15 0311  NA 139 139  K 3.3* 3.7  CL 106 107  CO2 21* 27  GLUCOSE 98 102*  BUN 6 7  CREATININE 0.65 0.90  CALCIUM 9.2 8.9   ------------------------------------------------------------------------------------------------------------------ estimated creatinine clearance is 122.8 mL/min (by C-[name] formula based on Cr of 0.9). ------------------------------------------------------------------------------------------------------------------ No results for input(s): TSH,  T4TOTAL, T3FREE, THYROIDAB in the last 72 hours.  Invalid input(s): FREET3   Coagulation profile No results for input(s): INR, PROTIME in the last 168 hours. ------------------------------------------------------------------------------------------------------------------- No results for input(s): DDIMER in the last 72 hours. -------------------------------------------------------------------------------------------------------------------  Cardiac Enzymes No results for input(s): CKMB, TROPONINI, MYOGLOBIN in the last 168 hours.  Invalid input(s): CK ------------------------------------------------------------------------------------------------------------------ Invalid input(s): POCBNP   ---------------------------------------------------------------------------------------------------------------  Urinalysis    Component Value Date/Time   COLORURINE COLORLESS* 09/03/2015 1234   APPEARANCEUR CLEAR* 09/03/2015 1234   LABSPEC 1.001* 09/03/2015 1234   PHURINE 5.0 09/03/2015 1234   GLUCOSEU NEGATIVE 09/03/2015 1234   HGBUR NEGATIVE 09/03/2015 1234   BILIRUBINUR NEGATIVE 09/03/2015 1234   KETONESUR NEGATIVE 09/03/2015 1234   PROTEINUR NEGATIVE 09/03/2015 1234   NITRITE NEGATIVE 09/03/2015 1234   LEUKOCYTESUR NEGATIVE 09/03/2015 1234  Imaging results:   Mr Foot Left W Wo Contrast  09/03/2015  CLINICAL DATA:  Open draining wound along the dorsal aspect of the amputated great toe. History of an amputation 2 months ago. EXAM: MRI OF THE LEFT FOREFOOT WITHOUT AND WITH CONTRAST TECHNIQUE: Multiplanar, multisequence MR imaging was performed both before and after administration of intravenous contrast. CONTRAST:  20mL MULTIHANCE GADOBENATE DIMEGLUMINE 529 MG/ML IV SOLN COMPARISON:  Radiographs 09/03/2015 FINDINGS: There is a rim enhancing abscess extending from the first metacarpal head to the skin surface along the dorsum of the great toe. This measures approximately 16 x 10 mm.  There is surrounding cellulitis and there is osteomyelitis involving the first metatarsal head and mid distal shaft. The other bony structures are intact. Midfoot degenerative changes but no other areas of osteomyelitis are identified. There is diffuse myositis and cellulitis. IMPRESSION: 1. Osteomyelitis involving the first metatarsal head and mid distal shaft. 2. Soft tissue abscess extending from the head of the first metatarsal to the skin dorsally. 3. Diffuse cellulitis and myositis. Electronically Signed   By: Rudie MeyerP.  Gallerani M.D.   On: 09/03/2015 15:47   Dg Foot Complete Left  09/03/2015  CLINICAL DATA:  Status post left great toe amputation at Thanksgiving, left foot pain and swelling, purulent drainage shin EXAM: LEFT FOOT - COMPLETE 3+ VIEW COMPARISON:  07/11/2015 FINDINGS: Three views of the left foot submitted the patient is status post amputation of great toe. There is significant soft tissue swelling at amputation site probable due to infection or cellulitis. Subtle cortical irregularity and focal osteopenia distal aspect of first metatarsal. This is highly suspicious for osteomyelitis. Clinical correlation is necessary. Further correlation with MRI is recommended as clinically warranted. IMPRESSION: Status post amputation of left first toe.There is significant soft tissue swelling at amputation site probable due to infection or cellulitis. Subtle cortical irregularity and focal osteopenia distal aspect of first metatarsal. This is highly suspicious for osteomyelitis. Clinical correlation is necessary. Further correlation with MRI is recommended as clinically warranted. Electronically Signed   By: Natasha MeadLiviu  Pop M.D.   On: 09/03/2015 10:21    Assessment & Plan: Patient was planned have surgery this morning to resect the infected portion of bone on the metatarsal head but his labs were positive for cocaine in his system. A stat urinalysis was run this morning and there were still some positive findings  for cocaine. We'll have to postpone the skin until tomorrow and see if is cleared with his system before anesthesia  will proceed. We will do another stat urinalysis tomorrow morning to check for cocaine. Currently on vancomycin and Zosyn. Continue that until cultures return.  Active Problems:   Osteomyelitis [facility](HCC)  Family Communication: Plan discussed with patient .  Epimenio SarinROXLER,[name] [name] M.D on 09/04/2015 at 8:29 AM

## 2015-09-04 NOTE — Progress Notes (Signed)
[facility] - [place] at [facility]   PATIENT NAME: [name] [name]    MR#:  [id number]  DATE OF BIRTH:  1961/02/25  SUBJECTIVE:  CHIEF COMPLAINT:   Chief Complaint  Patient presents with  . Foot Pain   patient is a 55 year old African male with medical history significant for history of left great toe amputation about 6 weeks ago who failed to return back for follow-up. Came to emergency room on January 6 with draining and swelling of amputation site, patient was evaluated by infectious disease specialist as well as podiatrist and felt that patient needs to undergo infected portion of the bone resection, however, he was found to have positive for cocaine and operation was postponed. Patient's wound cultures are pending. Blood cultures are negative so far. MRSA screen is negative. Last cocaine use was this Thursday, 2 days ago. Patient denies any chest pain  Review of Systems  Constitutional: Negative for fever, chills and weight loss.  HENT: Negative for congestion.   Eyes: Negative for blurred vision and double vision.  Respiratory: Negative for cough, sputum production, shortness of breath and wheezing.   Cardiovascular: Negative for chest pain, palpitations, orthopnea, leg swelling and PND.  Gastrointestinal: Negative for nausea, vomiting, abdominal pain, diarrhea, constipation and blood in stool.  Genitourinary: Negative for dysuria, urgency, frequency and hematuria.  Musculoskeletal: Negative for falls.  Neurological: Negative for dizziness, tremors, focal weakness and headaches.  Endo/Heme/Allergies: Does not bruise/bleed easily.  Psychiatric/Behavioral: Negative for depression. The patient does not have insomnia.     VITAL SIGNS: Blood pressure 120/74, pulse 81, temperature 98.1 F (36.7 C), temperature source Oral, resp. rate 18, height 6\' 4"  (1.93 m), weight 106.641 kg (235 lb 1.6 oz), SpO2 98 %.  PHYSICAL EXAMINATION:   GENERAL:  55 y.o.-year-old  patient lying in the bed with no acute distress.  EYES: Pupils equal, round, reactive to light and accommodation. No scleral icterus. Extraocular muscles intact.  HEENT: Head atraumatic, normocephalic. Oropharynx and nasopharynx clear.  NECK:  Supple, no jugular venous distention. No thyroid enlargement, no tenderness.  LUNGS: Normal breath sounds bilaterally, no wheezing, rales,rhonchi or crepitation. No use of accessory muscles of respiration.  CARDIOVASCULAR: S1, S2 normal. No murmurs, rubs, or gallops.  ABDOMEN: Soft, nontender, nondistended. Bowel sounds present. No organomegaly or mass.  EXTREMITIES: No pedal edema, cyanosis, or clubbing. Left foot is dressed NEUROLOGIC: Cranial nerves II through XII are intact. Muscle strength 5/5 in all extremities. Sensation intact. Gait not checked.  PSYCHIATRIC: The patient is alert and oriented x 3.  SKIN: No obvious rash, lesion, or ulcer.   ORDERS/RESULTS REVIEWED:   CBC  Recent Labs Lab 09/03/15 0927 09/04/15 0311  WBC 4.7 4.2  HGB 13.0 12.9*  HCT 38.7* 38.1*  PLT 257 234  MCV 99.8 101.7*  MCH 33.7 34.4*  MCHC 33.7 33.8  RDW 13.6 13.4  LYMPHSABS 0.9*  --   MONOABS 0.6  --   EOSABS 0.4  --   BASOSABS 0.0  --    ------------------------------------------------------------------------------------------------------------------  Chemistries   Recent Labs Lab 09/03/15 0927 09/04/15 0311  NA 139 139  K 3.3* 3.7  CL 106 107  CO2 21* 27  GLUCOSE 98 102*  BUN 6 7  CREATININE 0.65 0.90  CALCIUM 9.2 8.9   ------------------------------------------------------------------------------------------------------------------ estimated creatinine clearance is 122.8 mL/min (by C-G formula based on Cr of 0.9). ------------------------------------------------------------------------------------------------------------------ No results for input(s): TSH, T4TOTAL, T3FREE, THYROIDAB in the last 72 hours.  Invalid input(s):  FREET3  Cardiac  Enzymes No results for input(s): CKMB, TROPONINI, MYOGLOBIN in the last 168 hours.  Invalid input(s): CK ------------------------------------------------------------------------------------------------------------------ Invalid input(s): POCBNP ---------------------------------------------------------------------------------------------------------------  RADIOLOGY: Mr Foot Left W Wo Contrast  09/03/2015  CLINICAL DATA:  Open draining wound along the dorsal aspect of the amputated great toe. History of an amputation 2 months ago. EXAM: MRI OF THE LEFT FOREFOOT WITHOUT AND WITH CONTRAST TECHNIQUE: Multiplanar, multisequence MR imaging was performed both before and after administration of intravenous contrast. CONTRAST:  20mL MULTIHANCE GADOBENATE DIMEGLUMINE 529 MG/ML IV SOLN COMPARISON:  Radiographs 09/03/2015 FINDINGS: There is a rim enhancing abscess extending from the first metacarpal head to the skin surface along the dorsum of the great toe. This measures approximately 16 x 10 mm. There is surrounding cellulitis and there is osteomyelitis involving the first metatarsal head and mid distal shaft. The other bony structures are intact. Midfoot degenerative changes but no other areas of osteomyelitis are identified. There is diffuse myositis and cellulitis. IMPRESSION: 1. Osteomyelitis involving the first metatarsal head and mid distal shaft. 2. Soft tissue abscess extending from the head of the first metatarsal to the skin dorsally. 3. Diffuse cellulitis and myositis. Electronically Signed   By: Rudie MeyerP.  Gallerani M.D.   On: 09/03/2015 15:47   Dg Foot Complete Left  09/03/2015  CLINICAL DATA:  Status post left great toe amputation at [place], left foot pain and swelling, purulent drainage shin EXAM: LEFT FOOT - COMPLETE 3+ VIEW COMPARISON:  07/11/2015 FINDINGS: Three views of the left foot submitted the patient is status post amputation of great toe. There is significant soft tissue  swelling at amputation site probable due to infection or cellulitis. Subtle cortical irregularity and focal osteopenia distal aspect of first metatarsal. This is highly suspicious for osteomyelitis. Clinical correlation is necessary. Further correlation with MRI is recommended as clinically warranted. IMPRESSION: Status post amputation of left first toe.There is significant soft tissue swelling at amputation site probable due to infection or cellulitis. Subtle cortical irregularity and focal osteopenia distal aspect of first metatarsal. This is highly suspicious for osteomyelitis. Clinical correlation is necessary. Further correlation with MRI is recommended as clinically warranted. Electronically Signed   By: Natasha MeadLiviu  Pop M.D.   On: 09/03/2015 10:21    EKG:  Orders placed or performed during the hospital encounter of 07/11/15  . ED EKG  . ED EKG    ASSESSMENT AND PLAN:  Active Problems:   Osteomyelitis (HCC)  #1. Left foot first metatarsal head osteomyelitis needs to have amputation which will be performed by Dr. Orland Jarredroxler, provided he has no cocaine on his repeated urine drug screen tomorrow morning, Continue broad-spectrum antibody therapy with . Zosyn and vancomycin #2. Hypokalemia, resolved with IV fluid administration #3 anemia , seems to be stable. Follow with rehydration and postoperatively   #4. Cocaine abuse, discussed this patient. Patient would benefit from substance abuse program at mental health center, if agreeable #5. Tobacco abuse. Discussed with patient for approximately 5 minutes. Nicotine replacement therapy will be initiated,    Management plans discussed with the patient, family and they are in agreement.   DRUG ALLERGIES: No Known Allergies  CODE STATUS:     Code Status Orders        Start     Ordered   09/03/15 1341  Full code   Continuous     09/03/15 1340      TOTAL TIME TAKING CARE OF THIS PATIENT: 40 minutes.   discussed with Dr. Lora Havensroxler    Makynlee Kressin M.D on 09/04/2015  at 2:23 PM  Between 7am to 6pm - Pager - [phone]  After 6pm go to [email or web address] - password [email or web address]  [place] [facility]  Office  [phone]  CC: Primary care physician; No PCP Per Patient

## 2015-09-04 NOTE — Progress Notes (Signed)
Pharmacy Antibiotic Follow-up Note  [name] [name] is a 55 y.o. year-old male admitted on 09/03/2015.  The patient is currently on day 2 of Vancomycin and Zosyn for osteomyelitis.  Assessment/Plan: This patient's current antibiotics will be continued without adjustments.  Temp (24hrs), Avg:97.7 F (36.5 C), Min:97.4 F (36.3 C), Max:98.1 F (36.7 C)   Recent Labs Lab 09/03/15 0927 09/04/15 0311  WBC 4.7 4.2    Recent Labs Lab 09/03/15 0927 09/04/15 0311  CREATININE 0.65 0.90   Estimated Creatinine Clearance: 122.8 mL/min (by C-G formula based on Cr of 0.9).    No Known Allergies  Levels/dose changes this admission: 1/7 16:30 Vanc trough = 313mcg/ml, slightly subtherapeutic. Patient is at risk for accumulation however, will continue with current dosing and recheck a Vanc trough as well as a SCr tomorrow.  Thank you for allowing pharmacy to be a part of this patient's care.  Clovia CuffLisa Malikiah Debarr, PharmD, BCPS 09/04/2015 7:43 PM

## 2015-09-05 ENCOUNTER — Encounter
Admission: EM | Disposition: A | Discharge status: Home / Self Care | Payer: Self-pay | Source: Home / Self Care | Attending: Internal Medicine

## 2015-09-05 LAB — URINE DRUG SCREEN, QUALITATIVE (ARMC ONLY)
Amphetamines, Ur Screen: NOT DETECTED
BARBITURATES, UR SCREEN: NOT DETECTED
Benzodiazepine, Ur Scrn: NOT DETECTED
COCAINE METABOLITE, UR ~~LOC~~: POSITIVE — AB
Cannabinoid 50 Ng, Ur ~~LOC~~: NOT DETECTED
MDMA (ECSTASY) UR SCREEN: NOT DETECTED
METHADONE SCREEN, URINE: NOT DETECTED
Opiate, Ur Screen: NOT DETECTED
Phencyclidine (PCP) Ur S: NOT DETECTED
TRICYCLIC, UR SCREEN: NOT DETECTED

## 2015-09-05 LAB — VANCOMYCIN, TROUGH: Vancomycin Tr: 12 ug/mL (ref 10–20)

## 2015-09-05 LAB — CREATININE, SERUM: CREATININE: 0.81 mg/dL (ref 0.61–1.24)

## 2015-09-05 SURGERY — EXCISION, METATARSAL BONE, HEAD
Anesthesia: Choice | Laterality: Left

## 2015-09-05 MED ORDER — BUPIVACAINE HCL (PF) 0.5 % IJ SOLN
INTRAMUSCULAR | Status: AC
  Filled 2015-09-05: qty 30

## 2015-09-05 MED ORDER — SODIUM CHLORIDE 0.9 % IV SOLN
1250.0000 mg | Freq: Three times a day (TID) | INTRAVENOUS | Status: DC
  Administered 2015-09-05 – 2015-09-08 (×9): 1250 mg via INTRAVENOUS
  Filled 2015-09-05 (×11): qty 1250

## 2015-09-05 MED ORDER — LIDOCAINE HCL (PF) 1 % IJ SOLN
INTRAMUSCULAR | Status: AC
  Filled 2015-09-05: qty 30

## 2015-09-05 SURGICAL SUPPLY — 28 items
BAG COUNTER SPONGE EZ (MISCELLANEOUS) IMPLANT
BANDAGE STRETCH 3X4.1 STRL (GAUZE/BANDAGES/DRESSINGS) IMPLANT
BNDG ESMARK 4X12 TAN STRL LF (GAUZE/BANDAGES/DRESSINGS) IMPLANT
BNDG GAUZE 4.5X4.1 6PLY STRL (MISCELLANEOUS) ×3 IMPLANT
CLOSURE WOUND 1/4X4 (GAUZE/BANDAGES/DRESSINGS)
COUNTER SPONGE BAG EZ (MISCELLANEOUS)
DRAPE FLUOR MINI C-ARM 54X84 (DRAPES) IMPLANT
DRSG TEGADERM 4X4.75 (GAUZE/BANDAGES/DRESSINGS) IMPLANT
DURAPREP 26ML APPLICATOR (WOUND CARE) IMPLANT
GAUZE PETRO XEROFOAM 1X8 (MISCELLANEOUS) IMPLANT
GAUZE SPONGE 4X4 12PLY STRL (GAUZE/BANDAGES/DRESSINGS) IMPLANT
GAUZE STRETCH 2X75IN STRL (MISCELLANEOUS) IMPLANT
GLOVE BIO SURGEON STRL SZ7.5 (GLOVE) ×3 IMPLANT
GLOVE INDICATOR 8.0 STRL GRN (GLOVE) IMPLANT
GOWN STRL REUS W/ TWL LRG LVL3 (GOWN DISPOSABLE) IMPLANT
GOWN STRL REUS W/TWL LRG LVL3 (GOWN DISPOSABLE)
KIT RM TURNOVER STRD PROC AR (KITS) IMPLANT
LABEL OR SOLS (LABEL) IMPLANT
NDL SAFETY 18GX1.5 (NEEDLE) IMPLANT
NEEDLE HYPO 25X1 1.5 SAFETY (NEEDLE) IMPLANT
NS IRRIG 500ML POUR BTL (IV SOLUTION) IMPLANT
PACK EXTREMITY ARMC (MISCELLANEOUS) IMPLANT
PAD GROUND ADULT SPLIT (MISCELLANEOUS) IMPLANT
PENCIL ELECTRO HAND CTR (MISCELLANEOUS) IMPLANT
STOCKINETTE M/LG 89821 (MISCELLANEOUS) IMPLANT
STRIP CLOSURE SKIN 1/4X4 (GAUZE/BANDAGES/DRESSINGS) IMPLANT
SUT VIC AB 4-0 FS2 27 (SUTURE) IMPLANT
SUT VICRYL AB 3-0 FS1 BRD 27IN (SUTURE) IMPLANT

## 2015-09-05 NOTE — Progress Notes (Signed)
Pharmacy Antibiotic Follow-up Note  [name] [name] is a 55 y.o. year-old male admitted on 09/03/2015.  The patient is currently on day 3 of Vancomycin and zosyn for osteomyelitis.  Assessment/Plan: Patient is currently on Vancomycin 1g IV q8h. Repeat vanc trough today resulted at 3812mcg/ml. Patient does not appear to be accumulating at this point. Will increase to 1250mg  IV q8h and recheck trough level with 5th dose. Will need to watch carefully for accumulation.  Temp (24hrs), Avg:97.9 F (36.6 C), Min:97.4 F (36.3 C), Max:98.3 F (36.8 C)   Recent Labs Lab 09/03/15 0927 09/04/15 0311  WBC 4.7 4.2    Recent Labs Lab 09/03/15 0927 09/04/15 0311 09/05/15 1628  CREATININE 0.65 0.90 0.81   Estimated Creatinine Clearance: 136.4 mL/min (by C-G formula based on Cr of 0.81).    No Known Allergies  Antimicrobials this admission: Vancomycin 1/6 >>   Zosyn 1/6 >>    Levels/dose changes this admission: 1/7 Vanc trough of 13 mcg/ml, repeat level in 24hr to check for accumulation 1/8 Vanc trough of 12 mcg/ml, increase to 1250mg  IV q8h  Thank you for allowing pharmacy to be a part of this patient's care.  Clovia CuffLisa Jasyn Mey, PharmD, BCPS 09/05/2015 6:14 PM

## 2015-09-05 NOTE — Progress Notes (Signed)
Drug sceen positive for cocaine.  Dr troxler notified.  He will talk with anesthesiology and will get back with nursing

## 2015-09-05 NOTE — Progress Notes (Signed)
[facility] - [place] at [facility]   PATIENT NAME: [name] [name]    MR#:  [id number]  DATE OF BIRTH:  1961-05-02  SUBJECTIVE:  CHIEF COMPLAINT:   Chief Complaint  Patient presents with  . Foot Pain   patient is a 55 year old African male with medical history significant for history of left great toe amputation about 6 weeks ago who failed to return back for follow-up. Came to emergency room on January 6 with draining and swelling of amputation site, patient was evaluated by infectious disease specialist as well as podiatrist and felt that patient needs to undergo infected portion of the bone resection, however, he was found to have positive for cocaine and operation was postponed. Patient's wound cultures are pending. Blood cultures are negative so far. MRSA screen is negative. Last cocaine use was this Thursday, 2 days ago. Patient denies any chest pain Urine drug screen is positive for cocaine again today so operation is postponed until tomorrow pending urine drug screen results.   Review of Systems  Constitutional: Negative for fever, chills and weight loss.  HENT: Negative for congestion.   Eyes: Negative for blurred vision and double vision.  Respiratory: Negative for cough, sputum production, shortness of breath and wheezing.   Cardiovascular: Negative for chest pain, palpitations, orthopnea, leg swelling and PND.  Gastrointestinal: Negative for nausea, vomiting, abdominal pain, diarrhea, constipation and blood in stool.  Genitourinary: Negative for dysuria, urgency, frequency and hematuria.  Musculoskeletal: Negative for falls.  Neurological: Negative for dizziness, tremors, focal weakness and headaches.  Endo/Heme/Allergies: Does not bruise/bleed easily.  Psychiatric/Behavioral: Negative for depression. The patient does not have insomnia.     VITAL SIGNS: Blood pressure 115/73, pulse 73, temperature 97.9 F (36.6 C), temperature source Oral, resp. rate  16, height 6\' 4"  (1.93 m), weight 106.641 kg (235 lb 1.6 oz), SpO2 99 %.  PHYSICAL EXAMINATION:   GENERAL:  55 y.o.-year-old patient lying in the bed with no acute distress.  EYES: Pupils equal, round, reactive to light and accommodation. No scleral icterus. Extraocular muscles intact.  HEENT: Head atraumatic, normocephalic. Oropharynx and nasopharynx clear.  NECK:  Supple, no jugular venous distention. No thyroid enlargement, no tenderness.  LUNGS: Normal breath sounds bilaterally, no wheezing, rales,rhonchi or crepitation. No use of accessory muscles of respiration.  CARDIOVASCULAR: S1, S2 normal. No murmurs, rubs, or gallops.  ABDOMEN: Soft, nontender, nondistended. Bowel sounds present. No organomegaly or mass.  EXTREMITIES: No pedal edema, cyanosis, or clubbing. Left foot is dressed NEUROLOGIC: Cranial nerves II through XII are intact. Muscle strength 5/5 in all extremities. Sensation intact. Gait not checked.  PSYCHIATRIC: The patient is alert and oriented x 3.  SKIN: No obvious rash, lesion, or ulcer.   ORDERS/RESULTS REVIEWED:   CBC  Recent Labs Lab 09/03/15 0927 09/04/15 0311  WBC 4.7 4.2  HGB 13.0 12.9*  HCT 38.7* 38.1*  PLT 257 234  MCV 99.8 101.7*  MCH 33.7 34.4*  MCHC 33.7 33.8  RDW 13.6 13.4  LYMPHSABS 0.9*  --   MONOABS 0.6  --   EOSABS 0.4  --   BASOSABS 0.0  --    ------------------------------------------------------------------------------------------------------------------  Chemistries   Recent Labs Lab 09/03/15 0927 09/04/15 0311  NA 139 139  K 3.3* 3.7  CL 106 107  CO2 21* 27  GLUCOSE 98 102*  BUN 6 7  CREATININE 0.65 0.90  CALCIUM 9.2 8.9   ------------------------------------------------------------------------------------------------------------------ estimated creatinine clearance is 122.8 mL/min (by C-G formula based on Cr of  0.9). ------------------------------------------------------------------------------------------------------------------ No results for input(s): TSH, T4TOTAL, T3FREE, THYROIDAB in the last 72 hours.  Invalid input(s): FREET3  Cardiac Enzymes No results for input(s): CKMB, TROPONINI, MYOGLOBIN in the last 168 hours.  Invalid input(s): CK ------------------------------------------------------------------------------------------------------------------ Invalid input(s): POCBNP ---------------------------------------------------------------------------------------------------------------  RADIOLOGY: Mr Foot Left W Wo Contrast  09/03/2015  CLINICAL DATA:  Open draining wound along the dorsal aspect of the amputated great toe. History of an amputation 2 months ago. EXAM: MRI OF THE LEFT FOREFOOT WITHOUT AND WITH CONTRAST TECHNIQUE: Multiplanar, multisequence MR imaging was performed both before and after administration of intravenous contrast. CONTRAST:  20mL MULTIHANCE GADOBENATE DIMEGLUMINE 529 MG/ML IV SOLN COMPARISON:  Radiographs 09/03/2015 FINDINGS: There is a rim enhancing abscess extending from the first metacarpal head to the skin surface along the dorsum of the great toe. This measures approximately 16 x 10 mm. There is surrounding cellulitis and there is osteomyelitis involving the first metatarsal head and mid distal shaft. The other bony structures are intact. Midfoot degenerative changes but no other areas of osteomyelitis are identified. There is diffuse myositis and cellulitis. IMPRESSION: 1. Osteomyelitis involving the first metatarsal head and mid distal shaft. 2. Soft tissue abscess extending from the head of the first metatarsal to the skin dorsally. 3. Diffuse cellulitis and myositis. Electronically Signed   By: Rudie MeyerP.  Gallerani M.D.   On: 09/03/2015 15:47    EKG:  Orders placed or performed during the hospital encounter of 07/11/15  . ED EKG  . ED EKG    ASSESSMENT AND  PLAN:  Active Problems:   Osteomyelitis (HCC)  #1. Left foot first metatarsal head osteomyelitis,  needs to have amputation  performed by Dr. Orland Jarredroxler, again postponed until tomorrow, provided no cocaine on repeated urine drug screen tomorrow morning, Continue broad-spectrum antibody therapy with  Zosyn and vancomycin. Cultures were negative. Wound culture is pending. MRSA PCR is negative #2. Hypokalemia, resolved with IV fluid administration #3 anemia , seems to be stable. Follow with rehydration and postoperatively   #4. Cocaine abuse, discussed with the patient. Patient would benefit from substance abuse program at mental health center, if agreeable #5. Tobacco abuse. Discussed with patient for approximately 5 minutes. Nicotine replacement therapy will be initiated,    Management plans discussed with the patient, family and they are in agreement.   DRUG ALLERGIES: No Known Allergies  CODE STATUS:     Code Status Orders        Start     Ordered   09/03/15 1341  Full code   Continuous     09/03/15 1340      TOTAL TIME TAKING CARE OF THIS PATIENT: 30 minutes.    Katharina CaperVAICKUTE,Cabrina Shiroma M.D on 09/05/2015 at 12:57 PM  Between 7am to 6pm - Pager - [phone]  After 6pm go to [email or web address] - password [email or web address]  [facility]  Office  [phone]  CC: Primary care physician; No PCP Per Patient

## 2015-09-05 NOTE — Progress Notes (Signed)
The patient is still positive for cocaine on his urinalysis.  Otherwise he is stable without complaints.Will try and reschedulesurgery to remove infected bone and soft tissue  for tomorrow.

## 2015-09-06 ENCOUNTER — Inpatient Hospital Stay: Payer: BLUE CROSS/BLUE SHIELD | Admitting: Certified Registered Nurse Anesthetist

## 2015-09-06 ENCOUNTER — Encounter: Payer: Self-pay | Admitting: *Deleted

## 2015-09-06 ENCOUNTER — Encounter
Admission: EM | Disposition: A | Discharge status: Home / Self Care | Payer: Self-pay | Source: Home / Self Care | Attending: Internal Medicine

## 2015-09-06 HISTORY — PX: IRRIGATION AND DEBRIDEMENT FOOT: SHX6602

## 2015-09-06 LAB — URINE DRUG SCREEN, QUALITATIVE (ARMC ONLY)
Amphetamines, Ur Screen: NOT DETECTED
BARBITURATES, UR SCREEN: NOT DETECTED
Benzodiazepine, Ur Scrn: NOT DETECTED
CANNABINOID 50 NG, UR ~~LOC~~: NOT DETECTED
COCAINE METABOLITE, UR ~~LOC~~: NOT DETECTED
MDMA (Ecstasy)Ur Screen: NOT DETECTED
Methadone Scn, Ur: NOT DETECTED
OPIATE, UR SCREEN: NOT DETECTED
PHENCYCLIDINE (PCP) UR S: NOT DETECTED
Tricyclic, Ur Screen: NOT DETECTED

## 2015-09-06 LAB — RAPID HIV SCREEN (HIV 1/2 AB+AG)
HIV 1/2 Antibodies: NONREACTIVE
HIV-1 P24 Antigen - HIV24: NONREACTIVE

## 2015-09-06 SURGERY — IRRIGATION AND DEBRIDEMENT FOOT
Anesthesia: General | Laterality: Left

## 2015-09-06 MED ORDER — MIDAZOLAM HCL 2 MG/2ML IJ SOLN
INTRAMUSCULAR | Status: DC | PRN
  Administered 2015-09-06: 2 mg via INTRAVENOUS

## 2015-09-06 MED ORDER — ONDANSETRON HCL 4 MG/2ML IJ SOLN
INTRAMUSCULAR | Status: DC | PRN
  Administered 2015-09-06: 4 mg via INTRAVENOUS

## 2015-09-06 MED ORDER — VANCOMYCIN HCL 1000 MG IV SOLR
INTRAVENOUS | Status: DC | PRN
  Administered 2015-09-06: 1 g

## 2015-09-06 MED ORDER — LIDOCAINE HCL (PF) 1 % IJ SOLN
INTRAMUSCULAR | Status: AC
  Filled 2015-09-06: qty 30

## 2015-09-06 MED ORDER — VANCOMYCIN HCL 1000 MG IV SOLR
INTRAVENOUS | Status: AC
  Filled 2015-09-06: qty 1000

## 2015-09-06 MED ORDER — GENTAMICIN SULFATE 40 MG/ML IJ SOLN
INTRAMUSCULAR | Status: AC
  Filled 2015-09-06: qty 6

## 2015-09-06 MED ORDER — BUPIVACAINE HCL (PF) 0.5 % IJ SOLN
INTRAMUSCULAR | Status: AC
  Filled 2015-09-06: qty 30

## 2015-09-06 MED ORDER — GLYCOPYRROLATE 0.2 MG/ML IJ SOLN
INTRAMUSCULAR | Status: DC | PRN
  Administered 2015-09-06: 0.2 mg via INTRAVENOUS

## 2015-09-06 MED ORDER — LIDOCAINE HCL (CARDIAC) 20 MG/ML IV SOLN
INTRAVENOUS | Status: DC | PRN
  Administered 2015-09-06: 100 mg via INTRAVENOUS

## 2015-09-06 MED ORDER — SODIUM CHLORIDE 0.9 % IV SOLN
INTRAVENOUS | Status: DC
  Administered 2015-09-06: 12:00:00 via INTRAVENOUS

## 2015-09-06 MED ORDER — KETOROLAC TROMETHAMINE 30 MG/ML IJ SOLN
INTRAMUSCULAR | Status: DC | PRN
  Administered 2015-09-06: 30 mg via INTRAVENOUS

## 2015-09-06 MED ORDER — ONDANSETRON HCL 4 MG/2ML IJ SOLN: 4.0000 mg | Freq: Once | INTRAMUSCULAR | Status: DC | PRN

## 2015-09-06 MED ORDER — FENTANYL CITRATE (PF) 100 MCG/2ML IJ SOLN: 25.0000 ug | INTRAMUSCULAR | Status: DC | PRN

## 2015-09-06 MED ORDER — SODIUM CHLORIDE 0.9 % IJ SOLN
INTRAMUSCULAR | Status: AC
  Filled 2015-09-06: qty 3

## 2015-09-06 MED ORDER — FENTANYL CITRATE (PF) 100 MCG/2ML IJ SOLN
INTRAMUSCULAR | Status: DC | PRN
  Administered 2015-09-06: 50 ug via INTRAVENOUS

## 2015-09-06 MED ORDER — PROPOFOL 10 MG/ML IV BOLUS
INTRAVENOUS | Status: DC | PRN
  Administered 2015-09-06: 200 mg via INTRAVENOUS

## 2015-09-06 MED ORDER — BUPIVACAINE HCL (PF) 0.5 % IJ SOLN
INTRAMUSCULAR | Status: DC | PRN
  Administered 2015-09-06: 5 mL

## 2015-09-06 SURGICAL SUPPLY — 40 items
BAG COUNTER SPONGE EZ (MISCELLANEOUS) ×2 IMPLANT
BANDAGE ELASTIC 3 LF NS (GAUZE/BANDAGES/DRESSINGS) ×3 IMPLANT
BANDAGE ELASTIC 4 CLIP ST LF (GAUZE/BANDAGES/DRESSINGS) ×3 IMPLANT
BANDAGE ELASTIC 4 LF NS (GAUZE/BANDAGES/DRESSINGS) ×3 IMPLANT
BANDAGE STRETCH 3X4.1 STRL (GAUZE/BANDAGES/DRESSINGS) ×6 IMPLANT
BNDG ESMARK 4X12 TAN STRL LF (GAUZE/BANDAGES/DRESSINGS) ×3 IMPLANT
BNDG GAUZE 4.5X4.1 6PLY STRL (MISCELLANEOUS) ×6 IMPLANT
CANISTER SUCT 1200ML W/VALVE (MISCELLANEOUS) ×3 IMPLANT
COUNTER SPONGE BAG EZ (MISCELLANEOUS) ×1
DRAPE FLUOR MINI C-ARM 54X84 (DRAPES) ×3 IMPLANT
DURAPREP 26ML APPLICATOR (WOUND CARE) ×3 IMPLANT
GAUZE PETRO XEROFOAM 1X8 (MISCELLANEOUS) ×3 IMPLANT
GAUZE SPONGE 4X4 12PLY STRL (GAUZE/BANDAGES/DRESSINGS) ×6 IMPLANT
GLOVE BIO SURGEON STRL SZ8 (GLOVE) ×3 IMPLANT
GLOVE INDICATOR 7.5 STRL GRN (GLOVE) ×3 IMPLANT
GOWN STRL REUS W/ TWL LRG LVL3 (GOWN DISPOSABLE) ×1 IMPLANT
GOWN STRL REUS W/TWL LRG LVL3 (GOWN DISPOSABLE) ×2
KIT RM TURNOVER STRD PROC AR (KITS) ×3 IMPLANT
KIT STIMULAN RAPID CURE 5CC (Orthopedic Implant) ×3 IMPLANT
LABEL OR SOLS (LABEL) ×3 IMPLANT
NEEDLE FILTER BLUNT 18X 1/2SAF (NEEDLE) ×2
NEEDLE FILTER BLUNT 18X1 1/2 (NEEDLE) ×1 IMPLANT
NEEDLE HYPO 25X1 1.5 SAFETY (NEEDLE) ×6 IMPLANT
NS IRRIG 500ML POUR BTL (IV SOLUTION) ×3 IMPLANT
PACK EXTREMITY ARMC (MISCELLANEOUS) ×3 IMPLANT
PAD ABD DERMACEA PRESS 5X9 (GAUZE/BANDAGES/DRESSINGS) ×6 IMPLANT
PAD GROUND ADULT SPLIT (MISCELLANEOUS) ×3 IMPLANT
SPONGE XRAY 4X4 16PLY STRL (MISCELLANEOUS) ×3 IMPLANT
STIMULAN RAPID CURE IMPLANT
STOCKINETTE STRL 6IN 960660 (GAUZE/BANDAGES/DRESSINGS) ×3 IMPLANT
SUT ETHILON 3-0 FS-10 30 BLK (SUTURE) ×6
SUT ETHILON 4-0 (SUTURE) ×2
SUT ETHILON 4-0 FS2 18XMFL BLK (SUTURE) ×1
SUT VIC AB 3-0 SH 27 (SUTURE) ×2
SUT VIC AB 3-0 SH 27X BRD (SUTURE) ×1 IMPLANT
SUT VIC AB 4-0 FS2 27 (SUTURE) ×3 IMPLANT
SUTURE EHLN 3-0 FS-10 30 BLK (SUTURE) ×2 IMPLANT
SUTURE ETHLN 4-0 FS2 18XMF BLK (SUTURE) ×1 IMPLANT
SYR 3ML LL SCALE MARK (SYRINGE) ×6 IMPLANT
SYRINGE 10CC LL (SYRINGE) ×3 IMPLANT

## 2015-09-06 NOTE — Op Note (Signed)
Operative note   Surgeon: Dr. Recardo EvangelistMatthew Jerame [name], DPM.    Assistant: None    Preop diagnosis: Osteomyelitis first metatarsal head left foot    Postop diagnosis: Same    Procedure:   1. Resection of infected first metatarsal head and sesamoids and removal of infected soft tissue was well through excisional debridement first jet          EBL: 30 cc    Anesthesia:general with local block    Hemostasis: Ankle tourniquet 250 mmHg pressure for 15 minutes    Specimen:1. Excised ulceration from distal first metatarsal head left foot                      2. Infected first metatarsal head left foot   Complications: None    Operative indications: Chronic draining ulcer from first metatarsal distal head at previous great toe amputation site. MRI and x-rays indicated presence of osteomyelitis    Procedure:  Patient was brought into the OR and placed on the operating table in thesupine position. After anesthesia was obtained theleft lower extremity was prepped and draped in usual sterile fashion.  Operative Report: Attention was directed to the first metatarsal of the left foot. The ulceration was identified and on the dorsal aspect of the first metatarsal a 3 and half centimeter linear incision was made this was extended into 2 semielliptical incisions around the ulcer site and the ulcer was excised in toto. These incisions were joined at the distal plantar aspect of the first metatarsal head. Was completely excised the incision was deepened to the metatarsal. This point metatarsal head was identified areas of drainage were noted in the metatarsal head. A probe was used to check the level of demineralization of the first metatarsal head. Significant softness was noted at the area subchondrally to approximately 2-1/2 cm proximal from the subchondral bone. Once this harder former more normal-appearing bone was achieved the metatarsal was resected at that point with power equipment. This had metatarsal  head was removed in toto. The softer bone subchondrally was cultured for a bone culture and sent from the OR. The sesamoid bones were removed at this timeframe so has not serve as an irritant in the future. Abnormal infected soft tissue was then debrided and cleaned and suctioned with the versa jet. This also serve to irrigate the tissues nicely. Once everything was clean and viable healthy tissue was identified to all aspects of the wound the tourniquet was released and bleeders were clamped and bovied as required. At this point Stimulan beads were placed in the wound. These were laced with a combination of 1 g of vancomycin and 240 mg of gentamicin. All the beads were utilized. Once these were in place the periosteal capsular soft tissue was closed with 3-0 Vicryl simple interrupted sutures. Skin was enclosed with 3-0 nylon horizontal mattress and simple interrupted combination. A sterile compressive dressing was placed across wound consisting of Xeroform gauze 4 x 4's Kling and Kerlix and ABDs pads. An Ace wrap was placed externally.    Patient tolerated the procedure and anesthesia well.  Was transported from the OR to the PACU with all vital signs stable and vascular status intact. To be discharged per routine protocol.  Will follow up in approximately 1 week in the outpatient clinic.

## 2015-09-06 NOTE — Progress Notes (Signed)
[facility] [place] at [facility]   PATIENT NAME: [name] NightingaleBarney Berrian    MR#:  [id number]  DATE OF BIRTH:  21-Nov-1960  SUBJECTIVE:  CHIEF COMPLAINT:   Chief Complaint  Patient presents with  . Foot Pain   patient is a 55 year old African male with medical history significant for history of left great toe amputation about 6 weeks ago who failed to return back for follow-up. Came to emergency room on January 6 with draining and swelling of amputation site, patient was evaluated by infectious disease specialist as well as podiatrist and felt that patient needs to undergo infected portion of the bone resection, however, he was found to have positive for cocaine and operation was postponed. Patient's wound cultures are pending. Blood cultures are negative so far. MRSA screen is negative. Last cocaine use was this Thursday, 2 days ago. Patient denies any chest pain Urine drug screen was negative for cocaine today so operation was done. The patient underwent resection of infected first metatarsal head and the sesamoids and removal of infected soft tissue as well as excisional debridement of the foot.    Review of Systems  Constitutional: Negative for fever, chills and weight loss.  HENT: Negative for congestion.   Eyes: Negative for blurred vision and double vision.  Respiratory: Negative for cough, sputum production, shortness of breath and wheezing.   Cardiovascular: Negative for chest pain, palpitations, orthopnea, leg swelling and PND.  Gastrointestinal: Negative for nausea, vomiting, abdominal pain, diarrhea, constipation and blood in stool.  Genitourinary: Negative for dysuria, urgency, frequency and hematuria.  Musculoskeletal: Negative for falls.  Neurological: Negative for dizziness, tremors, focal weakness and headaches.  Endo/Heme/Allergies: Does not bruise/bleed easily.  Psychiatric/Behavioral: Negative for depression. The patient does not have insomnia.      VITAL SIGNS: Blood pressure 132/83, pulse 62, temperature 97.9 F (36.6 C), temperature source Oral, resp. rate 18, height 6\' 4"  (1.93 m), weight 106.595 kg (235 lb), SpO2 99 %.  PHYSICAL EXAMINATION:   GENERAL:  55 y.o.-year-old patient lying in the bed with no acute distress.  EYES: Pupils equal, round, reactive to light and accommodation. No scleral icterus. Extraocular muscles intact.  HEENT: Head atraumatic, normocephalic. Oropharynx and nasopharynx clear.  NECK:  Supple, no jugular venous distention. No thyroid enlargement, no tenderness.  LUNGS: Normal breath sounds bilaterally, no wheezing, rales,rhonchi or crepitation. No use of accessory muscles of respiration.  CARDIOVASCULAR: S1, S2 normal. No murmurs, rubs, or gallops.  ABDOMEN: Soft, nontender, nondistended. Bowel sounds present. No organomegaly or mass.  EXTREMITIES: No pedal edema, cyanosis, or clubbing. Left foot is dressed NEUROLOGIC: Cranial nerves II through XII are intact. Muscle strength 5/5 in all extremities. Sensation intact. Gait not checked.  PSYCHIATRIC: The patient is alert and oriented x 3.  SKIN: No obvious rash, lesion, or ulcer.   ORDERS/RESULTS REVIEWED:   CBC  Recent Labs Lab 09/03/15 0927 09/04/15 0311  WBC 4.7 4.2  HGB 13.0 12.9*  HCT 38.7* 38.1*  PLT 257 234  MCV 99.8 101.7*  MCH 33.7 34.4*  MCHC 33.7 33.8  RDW 13.6 13.4  LYMPHSABS 0.9*  --   MONOABS 0.6  --   EOSABS 0.4  --   BASOSABS 0.0  --    ------------------------------------------------------------------------------------------------------------------  Chemistries   Recent Labs Lab 09/03/15 0927 09/04/15 0311 09/05/15 1628  NA 139 139  --   K 3.3* 3.7  --   CL 106 107  --   CO2 21* 27  --   GLUCOSE  98 102*  --   BUN 6 7  --   CREATININE 0.65 0.90 0.81  CALCIUM 9.2 8.9  --    ------------------------------------------------------------------------------------------------------------------ estimated creatinine  clearance is 136.4 mL/min (by C-G formula based on Cr of 0.81). ------------------------------------------------------------------------------------------------------------------ No results for input(s): TSH, T4TOTAL, T3FREE, THYROIDAB in the last 72 hours.  Invalid input(s): FREET3  Cardiac Enzymes No results for input(s): CKMB, TROPONINI, MYOGLOBIN in the last 168 hours.  Invalid input(s): CK ------------------------------------------------------------------------------------------------------------------ Invalid input(s): POCBNP ---------------------------------------------------------------------------------------------------------------  RADIOLOGY: No results found.  EKG:  Orders placed or performed during the hospital encounter of 07/11/15  . ED EKG  . ED EKG    ASSESSMENT AND PLAN:  Active Problems:   Osteomyelitis (HCC)  #1. Left foot first metatarsal head osteomyelitis,   status post resection of infected first metatarsal head and sesamoids and removal of infected soft tissue by Dr. Orland Jarred 9th of January,  Continue broad-spectrum antibody therapy with  Zosyn and vancomycin. , blood Cultures were negative. Wound culture is pending. MRSA PCR is negative. Pain control  #2. Hypokalemia, resolved with IV fluid administration #3 anemia , seems to be stable. Follow with rehydration and postoperatively   #4. Cocaine abuse, discussed with the patient. Patient would benefit from substance abuse program at mental health center, if agreeable #5. Tobacco abuse. Discussed with patient for approximately 5 minutes. Nicotine replacement therapy will be initiated,    Management plans discussed with the patient, family and they are in agreement.   DRUG ALLERGIES: No Known Allergies  CODE STATUS:     Code Status Orders        Start     Ordered   09/03/15 1341  Full code   Continuous     09/03/15 1340      TOTAL TIME TAKING CARE OF THIS PATIENT: 30 minutes.    Katharina Caper  M.D on 09/06/2015 at 4:51 PM  Between 7am to 6pm - Pager - [phone]  After 6pm go to [email or web address] - password [email or web address] [facility]  [facility]  Office  [phone]  CC: Primary care physician; No PCP Per Patient

## 2015-09-06 NOTE — Progress Notes (Signed)
Dr Toxler aware of left foot dressing bleeding through at toes. Orders to reinforce with ABD pad and wrap with ace. Pt left foot reinforced with ABD and wrapped with ace.

## 2015-09-06 NOTE — H&P (Signed)
H and P has been reviewed and no changes are noted.  

## 2015-09-06 NOTE — Progress Notes (Signed)
ID E note Reviewed notes, labs, imaging. Surgery postponed from yesterday. Cx pending  [name] [name] is a 55 y.o. male with tobacco abuse, cocaine abuse, admitted with drainage and pain in great toe at prior amputation site. He had failed to follow up after his last surgery. MRI shows osteomyelitis. Prior cx had grown nml skin flora.  CRP 3.6, ESR 26  Recommendations Agree with surgical resection of infected bone.  Continue vanco and zosyn  Will be a poor PICC line candidate given hx substance abuse and noncompliance - unless he goes to a SNF. If he goes to SNF would rec PICC and IV abx. If not then oral abx

## 2015-09-06 NOTE — Anesthesia Postprocedure Evaluation (Signed)
Anesthesia Post Note  Patient: [name] [name]  Procedure(s) Performed: Procedure(s) (LRB): IRRIGATION AND DEBRIDEMENT FOOT (Left)  Patient location during evaluation: PACU Anesthesia Type: General Level of consciousness: awake Pain management: pain level controlled Vital Signs Assessment: post-procedure vital signs reviewed and stable Respiratory status: spontaneous breathing Cardiovascular status: blood pressure returned to baseline Anesthetic complications: no    Last Vitals:  Filed Vitals:   09/06/15 1838 09/06/15 1949  BP: 138/72 148/73  Pulse: 85 77  Temp: 36.8 C 36.4 C  Resp: 18 18    Last Pain:  Filed Vitals:   09/06/15 1949  PainSc: 0-No pain                 Saad Buhl S

## 2015-09-06 NOTE — Anesthesia Preprocedure Evaluation (Signed)
Anesthesia Evaluation  Patient identified by MRN, date of birth, ID band Patient awake    Reviewed: Allergy & Precautions, NPO status , Patient's Chart, lab work & pertinent test results, reviewed documented beta blocker date and time   Airway Mallampati: II  TM Distance: >3 FB     Dental  (+) Chipped   Pulmonary Current Smoker,           Cardiovascular      Neuro/Psych    GI/Hepatic   Endo/Other    Renal/GU      Musculoskeletal   Abdominal   Peds  Hematology  (+) anemia ,   Anesthesia Other Findings   Reproductive/Obstetrics                             Anesthesia Physical Anesthesia Plan  ASA: III  Anesthesia Plan: General   Post-op Pain Management:    Induction: Intravenous  Airway Management Planned: Oral ETT and LMA  Additional Equipment:   Intra-op Plan:   Post-operative Plan:   Informed Consent: I have reviewed the patients History and Physical, chart, labs and discussed the procedure including the risks, benefits and alternatives for the proposed anesthesia with the patient or authorized representative who has indicated his/her understanding and acceptance.     Plan Discussed with: CRNA  Anesthesia Plan Comments:         Anesthesia Quick Evaluation

## 2015-09-06 NOTE — Progress Notes (Signed)
Pharmacy Antibiotic Follow-up Note  [name] [name] is a 55 y.o. year-old male admitted on 09/03/2015.  The patient is currently on day 4 of Vancomycin and zosyn for osteomyelitis. Wound culture shows GNRs, GPCs, and GPRs.   CRP 3.6, ESR 26.  Assessment/Plan: Patient initially placed on Vancomycin 1g IV q8h. Repeat vanc trough 1/8 resulted at 62mg/ml. Patient does not appear to be accumulating at this point. New dose regimen of vancomycin 12558mIV q8h initiated on 1/8 and scheduled recheck trough level with 5th dose on 1/10 at 1030. Will need to watch carefully for accumulation.   Continue zosyn 3.375gm EIV q8hrs.  Temp (24hrs), Avg:97.9 F (36.6 C), Min:97.4 F (36.3 C), Max:98.5 F (36.9 C)    Recent Labs Lab 09/03/15 0927 09/04/15 0311 09/05/15 1628  CREATININE 0.65 0.90 0.81    Estimated Creatinine Clearance: 136.4 mL/min (by C-G formula based on Cr of 0.81).    No Known Allergies  Antimicrobials this admission: Vancomycin 1/6 >>   Zosyn 1/6 >>    Levels/dose changes this admission: 1/7 Vanc trough of 13 mcg/ml, repeat level in 24hr to check for accumulation 1/8 Vanc trough of 12 mcg/ml, increased to 125053mV q8h  Thank you for allowing pharmacy to be a part of this patient's care.  AllRoe CoombsharmD Pharmacy Resident 09/06/2015

## 2015-09-06 NOTE — Anesthesia Procedure Notes (Signed)
Procedure Name: LMA Insertion Date/Time: 09/06/2015 2:02 PM Performed by: Stormy FabianURTIS, Javeah Loeza Pre-anesthesia Checklist: Patient identified, Patient being monitored, Timeout performed, Emergency Drugs available and Suction available Patient Re-evaluated:Patient Re-evaluated prior to inductionOxygen Delivery Method: Circle system utilized Preoxygenation: Pre-oxygenation with 100% oxygen Intubation Type: IV induction Ventilation: Mask ventilation without difficulty LMA: LMA inserted LMA Size: 4.5 Tube type: Oral Number of attempts: 1 Placement Confirmation: positive ETCO2 and breath sounds checked- equal and bilateral Tube secured with: Tape Dental Injury: Teeth and Oropharynx as per pre-operative assessment

## 2015-09-06 NOTE — OR Nursing (Signed)
Lab called re: urine drug screen result- lab reports working with IT to get urine drug screen result moved in computer to correct date and time slot. Result marked as 09/05/2015 0645 is really from 09/06/2015 0645. D Kimbro OR aware.

## 2015-09-06 NOTE — Transfer of Care (Signed)
Immediate Anesthesia Transfer of Care Note  Patient: [name] [name] L Mackintosh  Procedure(s) Performed: Procedure(s): IRRIGATION AND DEBRIDEMENT FOOT (Left)  Patient Location: PACU  Anesthesia Type:General  Level of Consciousness: sedated  Airway & Oxygen Therapy: Patient Spontanous Breathing and Patient connected to face mask oxygen  Post-op Assessment: Report given to RN and Post -op Vital signs reviewed and stable  Post vital signs: Reviewed and stable  Last Vitals:  Filed Vitals:   09/06/15 1208 09/06/15 1501  BP: 130/80 130/78  Pulse: 65 67  Temp: 36.6 C 36.4 C  Resp: 16 9    Complications: No apparent anesthesia complications

## 2015-09-06 NOTE — Care Management Note (Signed)
Case Management Note  Patient Details  Name: [name] [name] MRN: [id number] Date of Birth: [birth year]  Subjective/Objective:                  Met with patient to discuss discharge planning. There's a possibility that patient will need long-term antibiotics. He has a history of cocaine use which is a concern that he may abuse IV PICC line if placed. He states he is independent and does not need any DME. He has no PCP. He also has a history of non-compliance as his "staples were not removed because he did not follow up with physician".   Action/Plan: RNCM will continue to follow.   Expected Discharge Date:                  Expected Discharge Plan:     In-House Referral:     Discharge planning Services  CM Consult  Post Acute Care Choice:  Home Health, Durable Medical Equipment Choice offered to:  Patient  DME Arranged:    DME Agency:     HH Arranged:    [place] Agency:     Status of Service:  In process, will continue to follow  Medicare Important Message Given:    Date Medicare IM Given:    Medicare IM give by:    Date Additional Medicare IM Given:    Additional Medicare Important Message give by:     If discussed at [place] of Stay Meetings, dates discussed:    Additional Comments:  Marshell Garfinkel, RN 09/06/2015, 10:59 AM

## 2015-09-07 ENCOUNTER — Encounter: Payer: Self-pay | Admitting: Podiatry

## 2015-09-07 LAB — CREATININE, SERUM: CREATININE: 0.81 mg/dL (ref 0.61–1.24)

## 2015-09-07 LAB — POTASSIUM: POTASSIUM: 4 mmol/L (ref 3.5–5.1)

## 2015-09-07 LAB — VANCOMYCIN, TROUGH: Vancomycin Tr: 19 ug/mL (ref 10–20)

## 2015-09-07 LAB — HEMOGLOBIN: HEMOGLOBIN: 12.3 g/dL — AB (ref 13.0–18.0)

## 2015-09-07 NOTE — Progress Notes (Signed)
Pharmacy Antibiotic Follow-up Note  [name] [name] is a 55 y.o. year-old male admitted on 09/03/2015.  The patient is currently on day 5 of Vancomycin and zosyn for osteomyelitis. Wound culture shows GNRs, GPCs, and GPRs.   CRP 3.6, ESR 26.  Assessment/Plan: Patient initially placed on Vancomycin 1g IV q8h. Repeat vanc trough 1/8 resulted at 24mg/ml. Patient does not appear to be accumulating at this point. New dose regimen of vancomycin 125108mIV q8h initiated on 1/8 with trough on 1/10 of 19, likely accumulating. Will schedule next trough level on 1/12 at 1030. Will need to watch carefully for continued accumulation.   Continue zosyn 3.375gm EIV q8hrs.  Temp (24hrs), Avg:97.8 F (36.6 C), Min:97.3 F (36.3 C), Max:98.3 F (36.8 C)    Recent Labs Lab 09/03/15 0927 09/04/15 0311 09/05/15 1628  CREATININE 0.65 0.90 0.81    Estimated Creatinine Clearance: 134.8 mL/min (by C-G formula based on Cr of 0.81).    No Known Allergies  Antimicrobials this admission: Vancomycin 1/6 >>   Zosyn 1/6 >>    Levels/dose changes this admission: 1/7 Vanc trough of 13 mcg/ml, repeat level in 24hr to check for accumulation 1/8 Vanc trough of 12 mcg/ml, increased to 125046mV q8h 1/10 Vanc trough of 77m74ml, repeat level on 1/12 to check for accumulation  Thank you for allowing pharmacy to be a part of this patient's care.  AlliRoe CoombsarmD Pharmacy Resident 09/07/2015

## 2015-09-07 NOTE — Evaluation (Signed)
Physical Therapy Evaluation Patient Details Name: [name] [name] MRN: [id number] DOB: [birth year] Today's Date: 09/07/2015   History of Present Illness  Pt is a 55 y.o. male with h/o L 1st toe amp 07/12/15 (non-compliant with f/u care per notes) and admitted with drainage and pain in great toe prior ampuation site.  Pt found to have osteomyelitis involving 1st metatarsal head and mid distal shaft.  Pt s/p 09/06/15 resection of infected 1st metatarsal head and sesamoids and removal of infected soft tissue as well through excisional debirdement first met.  Clinical Impression  Prior to admission, pt was independent without AD.  Pt lives with his brother in 1 level home with 5 STE with B railing.  Currently pt is modified independent with bed mobility, transfers, and ambulation short distance bed to bathroom and back (pt appearing with improved steadiness and improved gait technique using SPC); distance limited per PT orders for bathroom privileges only at this point.  Pt would benefit from skilled PT to further progress ambulation distance with least restrictive AD and trials stairs (when activity orders progressed).  Recommend pt discharge to home when medically appropriate.     Follow Up Recommendations No PT follow up    Equipment Recommendations   [facility])    Recommendations for Other Services       Precautions / Restrictions Precautions Precautions: Fall Precaution Comments: Bathroom privileges only at this point Restrictions Weight Bearing Restrictions: Yes Other Position/Activity Restrictions: ambulate with orthowedge shoe      Mobility  Bed Mobility Overal bed mobility: Independent                Transfers Overall transfer level: Modified independent Equipment used: None             General transfer comment: Sit to/from stand without AD; steady  Ambulation/Gait Ambulation/Gait assistance: Modified independent (Device/Increase time) Ambulation Distance (Feet):  (10  feet x2 (bed to bathroom and back)) Assistive device: Straight cane       General Gait Details: pt wore R shoe and L orthowedge shoe; pt with increased lateral sway without AD (pt has been walking to bathroom with only orthowedge shoe) but improved balance and decreased lateral sway noted with use of SPC; good use of orthowedge shoe  Stairs            Wheelchair Mobility    Modified Rankin (Stroke Patients Only)       Balance Overall balance assessment: Modified Independent                                           Pertinent Vitals/Pain Pain Assessment: No/denies pain  Vitals stable and WFL throughout treatment session.    Home Living Family/patient expects to be discharged to:: Private residence Living Arrangements: Other relatives (Pt's brother [name] [name]) Available Help at Discharge: Family   Home Access: Stairs to enter Entrance Stairs-Rails: Psychiatric nurse of Steps: University: One level [place]: None      Prior Function Level of Independence: Independent         Comments: Pt denies any h/o recent falls     Hand Dominance        Extremity/Trunk Assessment   Upper Extremity Assessment: Overall WFL for tasks assessed           Lower Extremity Assessment: RLE deficits/detail;LLE deficits/detail RLE Deficits / Details: R LE strength and  ROM WFL LLE Deficits / Details: L LE WFL except did not test L ankle/foot (L ankle DF at least 3/5 with AROM)  Cervical / Trunk Assessment: Normal  Communication   Communication: No difficulties  Cognition Arousal/Alertness: Awake/alert Behavior During Therapy: WFL for tasks assessed/performed Overall Cognitive Status: Within Functional Limits for tasks assessed                      General Comments General comments (skin integrity, edema, etc.): L foot dressings in place and ortho wedge shoe in place  Nursing cleared pt for participation in physical  therapy.  Pt agreeable to PT session.  Spoke directly with MD Troxler who reported pt is to be limited to bathroom privileges only and pt can use SPC (instead of RW).    Exercises        Assessment/Plan    PT Assessment Patient needs continued PT services  PT Diagnosis Difficulty walking   PT Problem List Decreased balance;Decreased mobility;Decreased knowledge of use of DME;Decreased knowledge of precautions  PT Treatment Interventions DME instruction;Gait training;Stair training;Functional mobility training;Therapeutic activities;Therapeutic exercise;Balance training;Patient/family education   PT Goals (Current goals can be found in the Care Plan section) Acute Rehab PT Goals Patient Stated Goal: to go home PT Goal Formulation: With patient Time For Goal Achievement: 09/21/15 Potential to Achieve Goals: Good    Frequency Min 2X/week   Barriers to discharge        Co-evaluation               End of Session Equipment Utilized During Treatment: Gait belt;Other (comment) (L ortho wedge shoe) Activity Tolerance: Patient tolerated treatment well Patient left: in bed;with call bell/phone within reach (pt moderate fall risk) Nurse Communication: Mobility status;Precautions;Weight bearing status         Time: 9741-6384 PT Time Calculation (min) (ACUTE ONLY): 15 min   Charges:   PT Evaluation $PT Eval Low Complexity: 1 Procedure     PT G CodesLeitha Bleak 09-21-2015, 2:36 PM Leitha Bleak, [place]

## 2015-09-07 NOTE — Progress Notes (Signed)
[place] INFECTIOUS DISEASE PROGRESS NOTE Date of Admission:  09/03/2015     ID: [name] [name] is a 55 y.o. male with osteomyelitis   Active Problems:   Osteomyelitis ([place])   Subjective: S/p surgery 1/9with resection of 1st MT head and soft tissue debridement  ROS  Eleven systems are reviewed and negative except per hpi  Medications:  Antibiotics Given (last 72 hours)    Date/Time Action Medication Dose Rate   09/04/15 1810 Given   vancomycin (VANCOCIN) IVPB 1000 mg/200 mL premix 1,000 mg 200 mL/hr   09/04/15 2043 Given   piperacillin-tazobactam (ZOSYN) IVPB 3.375 g 3.375 g 12.5 mL/hr   09/05/15 0115 Given   vancomycin (VANCOCIN) IVPB 1000 mg/200 mL premix 1,000 mg 200 mL/hr   09/05/15 0515 Given   piperacillin-tazobactam (ZOSYN) IVPB 3.375 g 3.375 g 12.5 mL/hr   09/05/15 0744 Given   vancomycin (VANCOCIN) IVPB 1000 mg/200 mL premix 1,000 mg 200 mL/hr   09/05/15 1523 Given   piperacillin-tazobactam (ZOSYN) IVPB 3.375 g 3.375 g 12.5 mL/hr   09/05/15 1834 Given   vancomycin (VANCOCIN) 1,250 mg in sodium chloride 0.9 % 250 mL IVPB 1,250 mg 166.7 mL/hr   09/05/15 2039 Given   piperacillin-tazobactam (ZOSYN) IVPB 3.375 g 3.375 g 12.5 mL/hr   09/06/15 0323 Given   vancomycin (VANCOCIN) 1,250 mg in sodium chloride 0.9 % 250 mL IVPB 1,250 mg 166.7 mL/hr   09/06/15 0559 Given   piperacillin-tazobactam (ZOSYN) IVPB 3.375 g 3.375 g 12.5 mL/hr   09/06/15 1224 Given   vancomycin (VANCOCIN) 1,250 mg in sodium chloride 0.9 % 250 mL IVPB 1,250 mg 166.7 mL/hr   09/06/15 1433 Given  [mixed with 230m of Gentamycin and mixed with antibiotic beads]   vancomycin (VANCOCIN) powder 1 g    09/06/15 1826 Given   vancomycin (VANCOCIN) 1,250 mg in sodium chloride 0.9 % 250 mL IVPB 1,250 mg 166.7 mL/hr   09/06/15 2119 Given   piperacillin-tazobactam (ZOSYN) IVPB 3.375 g 3.375 g 12.5 mL/hr   09/07/15 0216 Given   vancomycin (VANCOCIN) 1,250 mg in sodium chloride 0.9 % 250 mL IVPB 1,250 mg  166.7 mL/hr   09/07/15 1049 Given   vancomycin (VANCOCIN) 1,250 mg in sodium chloride 0.9 % 250 mL IVPB 1,250 mg 166.7 mL/hr     . docusate sodium  100 mg Oral BID  . enoxaparin (LOVENOX) injection  40 mg Subcutaneous Q24H  . nicotine  14 mg Transdermal Daily  . piperacillin-tazobactam (ZOSYN)  IV  3.375 g Intravenous 3 times per day  . vancomycin  1,250 mg Intravenous Q8H    Objective: Vital signs in last 24 hours: Temp:  [97.3 F (36.3 C)-98.3 F (36.8 C)] 98.3 F (36.8 C) (01/10 1535) Pulse Rate:  [62-95] 77 (01/10 1535) Resp:  [18] 18 (01/10 1535) BP: (113-148)/(68-83) 138/78 mmHg (01/10 1535) SpO2:  [98 %-100 %] 100 % (01/10 1535)  GENERAL: 55y.o.-year-old patient lying in the bed with no acute distress.  EYES: Pupils equal, round, reactive to light and accommodation. No scleral icterus. Extraocular muscles intact.  HEENT: Head atraumatic, normocephalic. Oropharynx and nasopharynx clear.  NECK: Supple, no jugular venous distention. No thyroid enlargement, no tenderness.  LUNGS: Normal breath sounds bilaterally, no wheezing, rales,rhonchi or crepitation. No use of accessory muscles of respiration.  CARDIOVASCULAR: S1, S2 normal. No murmurs, rubs, or gallops.  ABDOMEN: Soft, nontender, nondistended. Bowel sounds present. No organomegaly or mass.  EXTREMITIES: No cyanosis, or clubbing. L foot wrapped post op NEUROLOGIC: Cranial nerves II  through XII are intact. Muscle strength 5/5 in all extremities.PSYCHIATRIC: The patient is alert and oriented x 3.  SKIN: No obvious rash, lesion, or ulcer.   Lab Results  Recent Labs  09/05/15 1628 09/07/15 0905 09/07/15 1030  HGB  --  12.3*  --   K  --   --  4.0  CREATININE 0.81  --  0.81    Microbiology: Results for orders placed or performed during the hospital encounter of 09/03/15  Blood culture (routine x 2)     Status: None (Preliminary result)   Collection Time: 09/03/15  9:27 AM  Result Value Ref Range Status    Specimen Description BLOOD RIGHT ARM  Final   Special Requests BOTTLES DRAWN AEROBIC AND ANAEROBIC [id number]  Final   Culture NO GROWTH 4 DAYS  Final   Report Status PENDING  Incomplete  Blood culture (routine x 2)     Status: None (Preliminary result)   Collection Time: 09/03/15  9:34 AM  Result Value Ref Range Status   Specimen Description BLOOD LEFT ARM  Final   Special Requests BOTTLES DRAWN AEROBIC AND ANAEROBIC [place]  Final   Culture NO GROWTH 4 DAYS  Final   Report Status PENDING  Incomplete  Wound culture     Status: None (Preliminary result)   Collection Time: 09/03/15 12:58 PM  Result Value Ref Range Status   Specimen Description ABSCESS  Final   Special Requests Normal  Final   Gram Stain   Final    RARE WBC SEEN MANY GRAM NEGATIVE RODS MODERATE GRAM POSITIVE COCCI RARE GRAM POSITIVE RODS    Culture HOLDING FOR POSSIBLE PATHOGEN  Final   Report Status PENDING  Incomplete  Surgical pcr screen     Status: None   Collection Time: 09/03/15  7:10 PM  Result Value Ref Range Status   MRSA, PCR NEGATIVE NEGATIVE Final   Staphylococcus aureus NEGATIVE NEGATIVE Final    Comment:        The Xpert SA Assay (FDA approved for NASAL specimens in patients over 15 years of age), is one component of a comprehensive surveillance program.  Test performance has been validated by [facility] for patients greater than or equal to 81 year old. It is not intended to diagnose infection nor to guide or monitor treatment.     Studies/Results: Mr Foot Left W Wo Contrast  09/03/2015  CLINICAL DATA:  Open draining wound along the dorsal aspect of the amputated great toe. History of an amputation 2 months ago. EXAM: MRI OF THE LEFT FOREFOOT WITHOUT AND WITH CONTRAST TECHNIQUE: Multiplanar, multisequence MR imaging was performed both before and after administration of intravenous contrast. CONTRAST:  8m MULTIHANCE GADOBENATE DIMEGLUMINE 529 MG/ML IV SOLN COMPARISON:   Radiographs 09/03/2015 FINDINGS: There is a rim enhancing abscess extending from the first metacarpal head to the skin surface along the dorsum of the great toe. This measures approximately 16 x 10 mm. There is surrounding cellulitis and there is osteomyelitis involving the first metatarsal head and mid distal shaft. The other bony structures are intact. Midfoot degenerative changes but no other areas of osteomyelitis are identified. There is diffuse myositis and cellulitis. IMPRESSION: 1. Osteomyelitis involving the first metatarsal head and mid distal shaft. 2. Soft tissue abscess extending from the head of the first metatarsal to the skin dorsally. 3. Diffuse cellulitis and myositis. Electronically Signed   By: PMarijo SanesM.D.   On: 09/03/2015 15:47   Dg Foot Complete Left  09/03/2015  CLINICAL DATA:  Status post left great toe amputation at Thanksgiving, left foot pain and swelling, purulent drainage shin EXAM: LEFT FOOT - COMPLETE 3+ VIEW COMPARISON:  07/11/2015 FINDINGS: Three views of the left foot submitted the patient is status post amputation of great toe. There is significant soft tissue swelling at amputation site probable due to infection or cellulitis. Subtle cortical irregularity and focal osteopenia distal aspect of first metatarsal. This is highly suspicious for osteomyelitis. Clinical correlation is necessary. Further correlation with MRI is recommended as clinically warranted. IMPRESSION: Status post amputation of left first toe.There is significant soft tissue swelling at amputation site probable due to infection or cellulitis. Subtle cortical irregularity and focal osteopenia distal aspect of first metatarsal. This is highly suspicious for osteomyelitis. Clinical correlation is necessary. Further correlation with MRI is recommended as clinically warranted. Electronically Signed   By: Lahoma Crocker M.D.   On: 09/03/2015 10:21     Assessment/Plan:  [name] [name] is a 55 y.o. male with  tobacco abuse, cocaine abuse, admitted with drainage and pain in great toe at prior amputation site. He had failed to follow up after his last surgery. MRI shows osteomyelitis. Prior cx had grown nml skin flora.  CRP 3.6, ESR 26 S/p resection of 1st MT head 1/9   Recommendations  Continue vanco and zosyn while inpatient If cx remains negative can change to oral augmentin 875 bid - will need prolonged course - 4 weeks Is a poor PICC line candidate given hx substance abuse and noncompliance - I spoke with him regarding this- he denies hx of IVDU but admits to cocaine use Discussed the importance of follow up this time and he has agreed   Thank you very much for the consult. Will follow with you.  [place], [place]   09/07/2015, 4:03 PM

## 2015-09-07 NOTE — Progress Notes (Signed)
Subjective: After evaluation this morning along to make sure that there was no hematoma in the wound site. Still some drainage but no heavy bleeding at this point. Underwent surgery yesterday for first metatarsal head resection to remove infected bone. Antibiotic beads were placed in the wound these promote some drainage also.  Objective: Intact incision margin with a little bit of tall tissue to the area. Incision margin intact no evidence of infection.  Assessment: Intact wound with some tautness of tissues concerns for the [name] [name] since patient is still on Lovenox  Plan: Bandage was taken down and the distal portion of the amputation site of the first metatarsal and great toe was prepped with alcohol and Betadine. At this time an 18-gauge needle was inserted with 10 cc syringe and any hemorrhagic or bleeding tissue was removed [name] [name] Only approximately 3 cc of fluid were removed from the area. Patient had no pain or discomfort is secondary to his neuropathy. I redressed the area with a compressive wrap with extra gauze padding dispensed is OrthoWedge shoe. I good physical therapy, work with him regarding that. See him again tomorrow.

## 2015-09-07 NOTE — Progress Notes (Signed)
Patient Demographics  [name] [name], is a 55 y.o. male   MRN: [id number]   DOB - 1961-03-26  Admit Date - 09/03/2015    Outpatient Primary MD for the patient is No PCP Per Patient   With History of -  Past Medical History  Diagnosis Date  . Toe gangrene (HCC)   . Tobacco use disorder   . Anemia of chronic disease       Past Surgical History  Procedure Laterality Date  . Irrigation and debridement foot Left 07/12/2015    Procedure: IRRIGATION AND DEBRIDEMENT FOOT;  Surgeon: Gwyneth Revels, DPM;  Location: [facility] ORS;  Service: Podiatry;  Laterality: Left;  . Amputation toe Left 07/12/2015    Procedure: AMPUTATION TOE;  Surgeon: Gwyneth Revels, DPM;  Location: [facility] ORS;  Service: Podiatry;  Laterality: Left;    in for   Chief Complaint  Patient presents with  . Foot Pain     HPI  [name]  is a 55 y.o. male, underwent surgery yesterday to remove infected first metatarsal bone and excise ulceration from the left foot. He tolerated procedure and anesthesia well states is at low bit of pain with it but it's been fairly minimal technical couple pain pills. States he has not had any Fever temperature. Some blows on the fluoroscopy think he got up to go the bathroom once or twice during the evening. He did not have his postop shoe yet.    Review of Systems    In addition to the HPI above,  No Fever-chills, No Headache, No changes with Vision or hearing, No problems swallowing food or Liquids, No Chest pain, Cough or Shortness of Breath, No Abdominal pain, No Nausea or Vommitting, Bowel movements are regular, No Blood in stool or Urine, No dysuria, No new skin rashes or bruises, No new joints pains-aches,  No new weakness, tingling, numbness in any extremity, No recent weight gain or loss, No  polyuria, polydypsia or polyphagia, No significant Mental Stressors.  A full 10 point Review of Systems was done, except as stated above, all other Review of Systems were negative.   Social History Social History  Substance Use Topics  . Smoking status: Current Every Day Smoker -- 0.50 packs/day    Types: Cigarettes  . Smokeless tobacco: Not on file  . Alcohol Use: No     Family History Family History  Problem Relation Age of Onset  . CVA Mother   . CVA Brother   . Diabetes Mellitus II Father      Prior to Admission medications   Medication Sig Start Date End Date Taking? Authorizing Provider  amoxicillin-clavulanate (AUGMENTIN) 875-125 MG tablet Take 1 tablet by mouth 2 (two) times daily. Patient not taking: Reported on 09/03/2015 07/14/15   Katharina Caper, MD  docusate sodium (COLACE) 100 MG capsule Take 1 capsule (100 mg total) by mouth 2 (two) times daily. Patient not taking: Reported on 09/03/2015 07/14/15   Katharina Caper, MD  HYDROcodone-acetaminophen (NORCO/VICODIN) 5-325 MG tablet Take 1-2  tablets by mouth every 4 (four) hours as needed for moderate pain. Patient not taking: Reported on 09/03/2015 07/14/15   Katharina Caperima Vaickute, MD  naproxen (EC NAPROSYN) 500 MG EC tablet Take 1 tablet (500 mg total) by mouth 2 (two) times daily with a meal. Patient not taking: Reported on 07/11/2015 05/23/15   Charlesetta IvoryJenise V Bacon Menshew, PA-C  nicotine polacrilex (NICORETTE) 2 MG gum Take 1 each (2 mg total) by mouth as needed for smoking cessation. Patient not taking: Reported on 09/03/2015 07/14/15   Katharina Caperima Vaick[place], MD    Anti-infectives    Start     Dose/Rate Route Frequency Ordered Stop   09/06/15 1433  vancomycin (VANCOCIN) powder  Status:  Discontinued       As needed 09/06/15 1433 09/06/15 1457   09/05/15 1900  vancomycin (VANCOCIN) 1,250 mg in sodium chloride 0.9 % 250 mL IVPB     1,250 mg 166.7 mL/hr over 90 Minutes Intravenous Every 8 hours 09/05/15 1809     09/03/15 1700  vancomycin  (VANCOCIN) IVPB 1000 mg/200 mL premix  Status:  Discontinued     1,000 mg 200 mL/hr over 60 Minutes Intravenous Every 8 hours 09/03/15 1450 09/05/15 1809   09/03/15 1530  piperacillin-tazobactam (ZOSYN) IVPB 3.375 g     3.375 g 12.5 mL/hr over 240 Minutes Intravenous 3 times per day 09/03/15 1450     09/03/15 0945  piperacillin-tazobactam (ZOSYN) IVPB 3.375 g  Status:  Discontinued     3.375 g 12.5 mL/hr over 240 Minutes Intravenous  Once 09/03/15 0930 09/03/15 0930   09/03/15 0945  vancomycin (VANCOCIN) IVPB 1000 mg/200 mL premix     1,000 mg 200 mL/hr over 60 Minutes Intravenous  Once 09/03/15 0930 09/03/15 1151   09/03/15 0930  clindamycin (CLEOCIN) IVPB 600 mg  Status:  Discontinued     600 mg 100 mL/hr over 30 Minutes Intravenous  Once 09/03/15 0925 09/03/15 0930   09/03/15 0930  piperacillin-tazobactam (ZOSYN) IVPB 3.375 g     3.375 g 12.5 mL/hr over 240 Minutes Intravenous  Once 09/03/15 0925 09/03/15 1052      Scheduled Meds: . docusate sodium  100 mg Oral BID  . enoxaparin (LOVENOX) injection  40 mg Subcutaneous Q24H  . nicotine  14 mg Transdermal Daily  . piperacillin-tazobactam (ZOSYN)  IV  3.375 g Intravenous 3 times per day  . vancomycin  1,250 mg Intravenous Q8H   Continuous Infusions: . sodium chloride Stopped (09/06/15 1501)  . 0.9 % NaCl with KCl 20 mEq / L 100 mL/hr at 09/06/15 0910   PRN Meds:.acetaminophen **OR** acetaminophen, HYDROcodone-acetaminophen, nicotine polacrilex, ondansetron **OR** ondansetron (ZOFRAN) IV, polyethylene glycol  No Known Allergies  Physical Exam  Vitals  Blood pressure 113/69, pulse 65, temperature 98.1 F (36.7 C), temperature source Oral, resp. rate 18, height 6\' 4"  (1.93 m), weight 106.595 kg (235 lb), SpO2 100 %.  Lower Extremity exam: Bandages evaluated today. He has significant bleeding in the plantar aspect of bandage this point. Dressing was removed and some fresh bleeding is noted along the inner bandaging. Bandages  were reinforced yesterday at the PACU. Termination the wound today shows no intact incision margins no evidence of infection. Some continued hemorrhaging fairly mild and with compression of the wound occurs at the wound site but no pulsatile flow or excessive drainage is noted. Incision margin is noted is intact.  CBC  Recent Labs Lab 09/03/15 0927 09/04/15 0311  WBC 4.7 4.2  HGB 13.0 12.9*  HCT 38.7* 38.1*  PLT 257 234  MCV 99.8 101.7*  MCH 33.7 34.4*  MCHC 33.7 33.8  RDW 13.6 13.4  LYMPHSABS 0.9*  --   MONOABS 0.6  --   EOSABS 0.4  --   BASOSABS 0.0  --    ------------------------------------------------------------------------------------------------------------------  Chemistries   Recent Labs Lab 09/03/15 0927 09/04/15 0311 09/05/15 1628  NA 139 139  --   K 3.3* 3.7  --   CL 106 107  --   CO2 21* 27  --   GLUCOSE 98 102*  --   BUN 6 7  --   CREATININE 0.65 0.90 0.81  CALCIUM 9.2 8.9  --    ------------------------------------------------------------------------------------------------------------------ estimated creatinine clearance is 134.8 mL/min (by C-G formula based on Cr of 0.81). ------------------------------------------------------------------------------------------------------------------ No results for input(s): TSH, T4TOTAL, T3FREE, THYROIDAB in the last 72 hours.  Invalid input(s): FREET3   Coagulation profile No results for input(s): INR, PROTIME in the last 168 hours. ------------------------------------------------------------------------------------------------------------------- No results for input(s): DDIMER in the last 72 hours. -------------------------------------------------------------------------------------------------------------------  Cardiac Enzymes No results for input(s): CKMB, TROPONINI, MYOGLOBIN in the last 168 hours.  Invalid input(s):  CK ------------------------------------------------------------------------------------------------------------------ Invalid input(s): POCBNP   ---------------------------------------------------------------------------------------------------------------  Urinalysis    Component Value Date/Time   COLORURINE COLORLESS* 09/03/2015 1234   APPEARANCEUR CLEAR* 09/03/2015 1234   LABSPEC 1.001* 09/03/2015 1234   PHURINE 5.0 09/03/2015 1234   GLUCOSEU NEGATIVE 09/03/2015 1234   HGBUR NEGATIVE 09/03/2015 1234   BILIRUBINUR NEGATIVE 09/03/2015 1234   KETONESUR NEGATIVE 09/03/2015 1234   PROTEINUR NEGATIVE 09/03/2015 1234   NITRITE NEGATIVE 09/03/2015 1234   LEUKOCYTESUR NEGATIVE 09/03/2015 1234      Assessment & Plan: Overall the patient is stable at this juncture has a little bit of swelling around the area due to the hemorrhage but also due to the bead application with the stimulan beads laced with gentamicin and vancomycin. These will have a tendency to drain a good bit. Bleeding from the area appears more deoxygenated. I did not take the patient off of his Lovenox due to risk of DVT. Plan: Change dressing today apply heavy gauze wrap with compression to try to keep the area dry. Spoke with nursing to get him a postop wedge shoe so that he can go to the bathroom. I'll follow him again today and checked the area again. If still some hemorrhages noted and may try to drain the region with a syringe. Otherwise aikido and well is fairly stable.  Active Problems:   Osteomyelitis (HCC)   Epimenio Sarin M.D on 09/07/2015 at 9:47 AM

## 2015-09-07 NOTE — Care Management (Signed)
Cane requested from Will with [facility].

## 2015-09-07 NOTE — Progress Notes (Signed)
[facility] - [place] at [facility]   PATIENT NAME: [name] [name]    MR#:  [id number]  DATE OF BIRTH:  September 27, 1960  SUBJECTIVE:  CHIEF COMPLAINT:   Chief Complaint  Patient presents with  . Foot Pain   patient is a 55 year old African male with medical history significant for history of left great toe amputation about 6 weeks ago who failed to return back for follow-up. Came to emergency room on January 6 with draining and swelling of amputation site, patient was evaluated by infectious disease specialist as well as podiatrist and felt that patient needs to undergo infected portion of the bone resection, however, he was found to have positive for cocaine and operation was postponed. Patient's wound cultures are pending. Blood cultures are negative so far. MRSA screen is negative. The patient underwent resection of infected first metatarsal head and the sesamoids and removal of infected soft tissue as well as excisional debridement of the foot 09/06/2015.  Some bleeding today after  work out with the physical therapist. Dr. Lilia Pro attempted to aspirate hematoma. However, no hematoma was found. Patient's wound was redressed . OrthoWedge shoe was issued  Review of Systems  Constitutional: Negative for fever, chills and weight loss.  HENT: Negative for congestion.   Eyes: Negative for blurred vision and double vision.  Respiratory: Negative for cough, sputum production, shortness of breath and wheezing.   Cardiovascular: Negative for chest pain, palpitations, orthopnea, leg swelling and PND.  Gastrointestinal: Negative for nausea, vomiting, abdominal pain, diarrhea, constipation and blood in stool.  Genitourinary: Negative for dysuria, urgency, frequency and hematuria.  Musculoskeletal: Negative for falls.  Neurological: Negative for dizziness, tremors, focal weakness and headaches.  Endo/Heme/Allergies: Does not bruise/bleed easily.  Psychiatric/Behavioral: Negative for  depression. The patient does not have insomnia.     VITAL SIGNS: Blood pressure 138/78, pulse 77, temperature 98.3 F (36.8 C), temperature source Oral, resp. rate 18, height 6\' 4"  (1.93 m), weight 106.595 kg (235 lb), SpO2 100 %.  PHYSICAL EXAMINATION:   GENERAL:  55 y.o.-year-old patient lying in the bed with no acute distress.  EYES: Pupils equal, round, reactive to light and accommodation. No scleral icterus. Extraocular muscles intact.  HEENT: Head atraumatic, normocephalic. Oropharynx and nasopharynx clear.  NECK:  Supple, no jugular venous distention. No thyroid enlargement, no tenderness.  LUNGS: Normal breath sounds bilaterally, no wheezing, rales,rhonchi or crepitation. No use of accessory muscles of respiration.  CARDIOVASCULAR: S1, S2 normal. No murmurs, rubs, or gallops.  ABDOMEN: Soft, nontender, nondistended. Bowel sounds present. No organomegaly or mass.  EXTREMITIES: No pedal edema, cyanosis, or clubbing. Left foot is dressed, some fresh blood was noted in the distal part of the foot at the bottom of the foot NEUROLOGIC: Cranial nerves II through XII are intact. Muscle strength 5/5 in all extremities. Sensation intact. Gait not checked.  PSYCHIATRIC: The patient is alert and oriented x 3.  SKIN: No obvious rash, lesion, or ulcer.   ORDERS/RESULTS REVIEWED:   CBC  Recent Labs Lab 09/03/15 0927 09/04/15 0311 09/07/15 0905  WBC 4.7 4.2  --   HGB 13.0 12.9* 12.3*  HCT 38.7* 38.1*  --   PLT 257 234  --   MCV 99.8 101.7*  --   MCH 33.7 34.4*  --   MCHC 33.7 33.8  --   RDW 13.6 13.4  --   LYMPHSABS 0.9*  --   --   MONOABS 0.6  --   --   EOSABS 0.4  --   --  BASOSABS 0.0  --   --    ------------------------------------------------------------------------------------------------------------------  Chemistries   Recent Labs Lab 09/03/15 0927 09/04/15 0311 09/05/15 1628 09/07/15 1030  NA 139 139  --   --   K 3.3* 3.7  --  4.0  CL 106 107  --   --   CO2  21* 27  --   --   GLUCOSE 98 102*  --   --   BUN 6 7  --   --   CREATININE 0.65 0.90 0.81 0.81  CALCIUM 9.2 8.9  --   --    ------------------------------------------------------------------------------------------------------------------ estimated creatinine clearance is 134.8 mL/min (by C-G formula based on Cr of 0.81). ------------------------------------------------------------------------------------------------------------------ No results for input(s): TSH, T4TOTAL, T3FREE, THYROIDAB in the last 72 hours.  Invalid input(s): FREET3  Cardiac Enzymes No results for input(s): CKMB, TROPONINI, MYOGLOBIN in the last 168 hours.  Invalid input(s): CK ------------------------------------------------------------------------------------------------------------------ Invalid input(s): POCBNP ---------------------------------------------------------------------------------------------------------------  RADIOLOGY: No results found.  EKG:  Orders placed or performed during the hospital encounter of 07/11/15  . ED EKG  . ED EKG    ASSESSMENT AND PLAN:  Active Problems:   Osteomyelitis (HCC)  #1. Left foot first metatarsal head osteomyelitis,   status post resection of infected first metatarsal head and sesamoids and removal of infected soft tissue by Dr. Orland Jarredroxler 9th of January,  Continue broad-spectrum antibody therapy with  Zosyn and vancomycin. , blood Cultures were negative. Wound culture is pending. MRSA PCR is negative. Pain control is adequate #2. Hypokalemia, resolved with IV fluid administration #3 anemia , seems to be stable with rehydration and postoperatively   #4. Cocaine abuse, discussed with the patient in the past. Patient would benefit from substance abuse program at mental health center, if agreeable, upon discharge #5. Tobacco abuse. Discussed with patient for approximately 5 minutes. Nicotine replacement therapy to be continued   Management plans discussed with the  patient, family and they are in agreement.   DRUG ALLERGIES: No Known Allergies  CODE STATUS:     Code Status Orders        Start     Ordered   09/03/15 1341  Full code   Continuous     09/03/15 1340      TOTAL TIME TAKING CARE OF THIS PATIENT: 30 minutes.    Katharina CaperVAICKUTE,Viriginia Amendola M.D on 09/07/2015 at 3:43 PM  Between 7am to 6pm - Pager - [phone]  After 6pm go to [email or web address] - password [email or web address] [facility]  [facility]  Office  [phone]  CC: Primary care physician; No PCP Per Patient

## 2015-09-08 DIAGNOSIS — D649 Anemia, unspecified: Secondary | ICD-10-CM

## 2015-09-08 DIAGNOSIS — F1410 Cocaine abuse, uncomplicated: Secondary | ICD-10-CM

## 2015-09-08 DIAGNOSIS — A498 Other bacterial infections of unspecified site: Secondary | ICD-10-CM

## 2015-09-08 MED ORDER — METRONIDAZOLE 500 MG PO TABS: 500.0000 mg | ORAL_TABLET | Freq: Three times a day (TID) | ORAL | Status: DC

## 2015-09-08 MED ORDER — CIPROFLOXACIN HCL 500 MG PO TABS: 500.0000 mg | ORAL_TABLET | Freq: Two times a day (BID) | ORAL | Status: DC

## 2015-09-08 MED ORDER — NICOTINE 14 MG/24HR TD PT24: 14.0000 mg | MEDICATED_PATCH | Freq: Every day | TRANSDERMAL | Status: DC

## 2015-09-08 MED ORDER — POLYETHYLENE GLYCOL 3350 17 G PO PACK: 17.0000 g | PACK | Freq: Every day | ORAL | Status: DC | PRN

## 2015-09-08 MED ORDER — HYDROCODONE-ACETAMINOPHEN 5-325 MG PO TABS: 1.0000 | ORAL_TABLET | ORAL | Status: DC | PRN

## 2015-09-08 NOTE — Progress Notes (Signed)
Initial Nutrition Assessment   INTERVENTION:   Meals and Snacks: Cater to patient preferences Medical Food Supplement Therapy: will recommend on follow if po intake poor   NUTRITION DIAGNOSIS:   No nutrition diagnosis at this time  GOAL:   Patient will meet greater than or equal to 90% of their needs  MONITOR:    (Energy Intake, Electrolyte and Renal Profile, Anthropometrics)  REASON FOR ASSESSMENT:   LOS    ASSESSMENT:    Pt s/p resection of 1st MT of left great toe s/p infection.   Past Medical History  Diagnosis Date  . Toe gangrene (HCC)   . Tobacco use disorder   . Anemia of chronic disease      Diet Order:  Diet regular Room service appropriate?: Yes; Fluid consistency:: Thin    Current Nutrition: Upon chart review pt has eaten 100% of all meals since admission. Per MST no decrease in appetite PTA.    Scheduled Medications:  . docusate sodium  100 mg Oral BID  . enoxaparin (LOVENOX) injection  40 mg Subcutaneous Q24H  . nicotine  14 mg Transdermal Daily  . piperacillin-tazobactam (ZOSYN)  IV  3.375 g Intravenous 3 times per day      Electrolyte/Renal Profile and Glucose Profile:   Recent Labs Lab 09/03/15 0927 09/04/15 0311 09/05/15 1628 09/07/15 1030  NA 139 139  --   --   K 3.3* 3.7  --  4.0  CL 106 107  --   --   CO2 21* 27  --   --   BUN 6 7  --   --   CREATININE 0.65 0.90 0.81 0.81  CALCIUM 9.2 8.9  --   --   GLUCOSE 98 102*  --   --    Protein Profile: No results for input(s): ALBUMIN in the last 168 hours.  Gastrointestinal Profile: Last BM:  09/08/2015   Weight Change: Per MST no change in weight PTA   Height:   Ht Readings from Last 1 Encounters:  09/06/15 6\' 4"  (1.93 m)    Weight:   Wt Readings from Last 1 Encounters:  09/06/15 235 lb (106.595 kg)     BMI:  Body mass index is 28.62 kg/(m^2).   EDUCATION NEEDS:   No education needs identified at this time   LOW Care Level  Leda QuailAllyson Sewell Pitner, RD, LDN Pager  [phone] Weekend/On-Call Pager [phone]

## 2015-09-08 NOTE — Progress Notes (Signed)
   09/08/15 1022  Clinical Encounter Type  Visited With Patient  Visit Type Initial  Consult/Referral To Chaplain  Stress Factors  Patient Stress Factors None identified  Chaplain rounded in unit and offered pastoral care to patient.   Chaplain Rory Xiang [phone]

## 2015-09-08 NOTE — Progress Notes (Signed)
Patient Demographics  [name] [name], is a 55 y.o. male   MRN: [id number]   DOB - 1960-12-13  Admit Date - 09/03/2015    Outpatient Primary MD for the patient is No PCP Per Patient  Consult requested in the Hospital by Katharina Caper, MD, On 09/08/2015   With History of -  Past Medical History  Diagnosis Date  . Toe gangrene (HCC)   . Tobacco use disorder   . Anemia of chronic disease       Past Surgical History  Procedure Laterality Date  . Irrigation and debridement foot Left 07/12/2015    Procedure: IRRIGATION AND DEBRIDEMENT FOOT;  Surgeon: Gwyneth Revels, DPM;  Location: [facility] ORS;  Service: Podiatry;  Laterality: Left;  . Amputation toe Left 07/12/2015    Procedure: AMPUTATION TOE;  Surgeon: Gwyneth Revels, DPM;  Location: [facility] ORS;  Service: Podiatry;  Laterality: Left;  . Irrigation and debridement foot Left 09/06/2015    Procedure: IRRIGATION AND DEBRIDEMENT FOOT;  Surgeon: Recardo Evangelist, DPM;  Location: [facility] ORS;  Service: Podiatry;  Laterality: Left;    in for   Chief Complaint  Patient presents with  . Foot Pain     HPI  [name] [name]  is a 55 y.o. male, surgery on Monday to remove the infected first metatarsal bone. Approximately the distal third was removed. Beads were placed in consisting of vancomycin and gentamicin laced beads. He has remained afebrile and has no pain with it.   Review of Systems    In addition to the HPI above,  No Fever-chills, No Headache, No changes with Vision or hearing, No problems swallowing food or Liquids, No Chest pain, Cough or Shortness of Breath, No Abdominal pain, No Nausea or Vommitting, Bowel movements are regular, No Blood in stool or Urine, No dysuria, No new skin rashes or bruises, No new joints pains-aches,  No new weakness, tingling, numbness  in any extremity, No recent weight gain or loss, No polyuria, polydypsia or polyphagia, No significant Mental Stressors.  A full 10 point Review of Systems was done, except as stated above, all other Review of Systems were negative.   Social History Social History  Substance Use Topics  . Smoking status: Current Every Day Smoker -- 0.50 packs/day    Types: Cigarettes  . Smokeless tobacco: Not on file  . Alcohol Use: No     Family History Family History  Problem Relation Age of Onset  . CVA Mother   . CVA Brother   . Diabetes Mellitus II Father     Prior to Admission medications   Medication Sig Start Date End Date Taking? Authorizing Provider  amoxicillin-clavulanate (AUGMENTIN) 875-125 MG tablet Take 1 tablet by mouth 2 (two) times daily. Patient not taking: Reported on 09/03/2015 07/14/15   Katharina Caper, MD  ciprofloxacin (CIPRO) 500 MG tablet Take 1 tablet (500 mg total) by mouth 2 (two) times daily. 09/08/15   Katharina Caper, MD  docusate sodium (COLACE) 100 MG capsule Take 1 capsule (100 mg total) by mouth 2 (two) times daily. Patient not taking: Reported on 09/03/2015 07/14/15   Katharina Caper, MD  HYDROcodone-acetaminophen (NORCO/VICODIN) 5-325 MG tablet Take 1-2 tablets by mouth every  4 (four) hours as needed for moderate pain. Patient not taking: Reported on 09/03/2015 07/14/15   Katharina Caper, MD  HYDROcodone-acetaminophen (NORCO/VICODIN) 5-325 MG tablet Take 1-2 tablets by mouth every 4 (four) hours as needed for moderate pain. 09/08/15   Katharina Caper, MD  metroNIDAZOLE (FLAGYL) 500 MG tablet Take 1 tablet (500 mg total) by mouth 3 (three) times daily. 09/08/15   Katharina Caper, MD  naproxen (EC NAPROSYN) 500 MG EC tablet Take 1 tablet (500 mg total) by mouth 2 (two) times daily with a meal. Patient not taking: Reported on 07/11/2015 05/23/15   Jenise V Bacon Menshew, PA-C  nicotine (NICODERM CQ - DOSED IN MG/24 HOURS) 14 mg/24hr patch Place 1 patch (14 mg total) onto the skin  daily. 09/08/15   Katharina Caper, MD  nicotine polacrilex (NICORETTE) 2 MG gum Take 1 each (2 mg total) by mouth as needed for smoking cessation. Patient not taking: Reported on 09/03/2015 07/14/15   Katharina Caper, MD  polyethylene glycol (MIRALAX / GLYCOLAX) packet Take 17 g by mouth daily as needed for mild constipation. 09/08/15   Katharina Caper, MD    Anti-infectives    Start     Dose/Rate Route Frequency Ordered Stop   09/08/15 0000  ciprofloxacin (CIPRO) 500 MG tablet     500 mg Oral 2 times daily 09/08/15 1700     09/08/15 0000  metroNIDAZOLE (FLAGYL) 500 MG tablet     500 mg Oral 3 times daily 09/08/15 1700     09/06/15 1433  vancomycin (VANCOCIN) powder  Status:  Discontinued       As needed 09/06/15 1433 09/06/15 1457   09/05/15 1900  vancomycin (VANCOCIN) 1,250 mg in sodium chloride 0.9 % 250 mL IVPB  Status:  Discontinued     1,250 mg 166.7 mL/hr over 90 Minutes Intravenous Every 8 hours 09/05/15 1809 09/08/15 1347   09/03/15 1700  vancomycin (VANCOCIN) IVPB 1000 mg/200 mL premix  Status:  Discontinued     1,000 mg 200 mL/hr over 60 Minutes Intravenous Every 8 hours 09/03/15 1450 09/05/15 1809   09/03/15 1530  piperacillin-tazobactam (ZOSYN) IVPB 3.375 g     3.375 g 12.5 mL/hr over 240 Minutes Intravenous 3 times per day 09/03/15 1450     09/03/15 0945  piperacillin-tazobactam (ZOSYN) IVPB 3.375 g  Status:  Discontinued     3.375 g 12.5 mL/hr over 240 Minutes Intravenous  Once 09/03/15 0930 09/03/15 0930   09/03/15 0945  vancomycin (VANCOCIN) IVPB 1000 mg/200 mL premix     1,000 mg 200 mL/hr over 60 Minutes Intravenous  Once 09/03/15 0930 09/03/15 1151   09/03/15 0930  clindamycin (CLEOCIN) IVPB 600 mg  Status:  Discontinued     600 mg 100 mL/hr over 30 Minutes Intravenous  Once 09/03/15 0925 09/03/15 0930   09/03/15 0930  piperacillin-tazobactam (ZOSYN) IVPB 3.375 g     3.375 g 12.5 mL/hr over 240 Minutes Intravenous  Once 09/03/15 0925 09/03/15 1052      Scheduled  Meds: . docusate sodium  100 mg Oral BID  . enoxaparin (LOVENOX) injection  40 mg Subcutaneous Q24H  . nicotine  14 mg Transdermal Daily  . piperacillin-tazobactam (ZOSYN)  IV  3.375 g Intravenous 3 times per day  No Known Allergies  Physical Exam  Vitals  Blood pressure 141/80, pulse 80, temperature 98.9 F (37.2 C), temperature source Oral, resp. rate 18, height 6\' 4"  (1.93 m), weight 106.595 kg (235 lb), SpO2 99 %.  Lower Extremity exam: Bandages  removed and the wound is inspected. Margins are clean some hemorrhage from the region but it is fairly minimal at this point. Patient is still on his anticoagulant. Was able to get up and move around today with physical therapy using the OrthoWedge shoe and a walker. No redness or inflammation to the wound. No purulent drainage just some hemorrhagic drainage and also drains associated with the bead presence in the deeper tissues.  Data Review  CBC  Recent Labs Lab 09/03/15 0927 09/04/15 0311 09/07/15 0905  WBC 4.7 4.2  --   HGB 13.0 12.9* 12.3*  HCT 38.7* 38.1*  --   PLT 257 234  --   MCV 99.8 101.7*  --   MCH 33.7 34.4*  --   MCHC 33.7 33.8  --   RDW 13.6 13.4  --   LYMPHSABS 0.9*  --   --   MONOABS 0.6  --   --   EOSABS 0.4  --   --   BASOSABS 0.0  --   --    ------------------------------------------------------------------------------------------------------------------  Chemistries   Recent Labs Lab 09/03/15 0927 09/04/15 0311 09/05/15 1628 09/07/15 1030  NA 139 139  --   --   K 3.3* 3.7  --  4.0  CL 106 107  --   --   CO2 21* 27  --   --   GLUCOSE 98 102*  --   --   BUN 6 7  --   --   CREATININE 0.65 0.90 0.81 0.81  CALCIUM 9.2 8.9  --   --    ------------------------------------------------------------------------------------------------------------------ estimated creatinine clearance is 134.8 mL/min (by C-G formula based on Cr of  0.81). --------------- ---------------------------------------------------------------------------------------------------------------  Urinalysis    Component Value Date/Time   COLORURINE COLORLESS* 09/03/2015 1234   APPEARANCEUR CLEAR* 09/03/2015 1234   LABSPEC 1.001* 09/03/2015 1234   PHURINE 5.0 09/03/2015 1234   GLUCOSEU NEGATIVE 09/03/2015 1234   HGBUR NEGATIVE 09/03/2015 1234   BILIRUBINUR NEGATIVE 09/03/2015 1234   KETONESUR NEGATIVE 09/03/2015 1234   PROTEINUR NEGATIVE 09/03/2015 1234   NITRITE NEGATIVE 09/03/2015 1234   LEUKOCYTESUR NEGATIVE 09/03/2015 1234    Assessment & Plan: Foot appears to be very stable at this point. Wound is stable with no dehiscence. Bleeding is slowed considerably. Plan: Dr. Sampson GoonFitzgerald is seen in for antibiotics. I did get a bone sample for culture during surgery on 1-9 but I do not see that listed in the microbiology at this point. I will follow that up and make sure that it's being run. Wound cultures are probably associated with same organism are showing a Klebsiella. Overall I think is fairly stable at this point as soon as we can determine anabiosis that he should be able to go home. He is functional with the OrthoWedge shoe and with a walker at this point. He does not need dressing changes. He can leave the dressing on. I need to see him on a weekly basis. Call if any questions arise. Principal Problem:   Osteomyelitis (HCC) Active Problems:   Infection, Proteus   Anemia   Cocaine abuse  Recardo EvangelistROXLER,Austyn Seier G M.D on 09/08/2015 at 5:49 PM

## 2015-09-08 NOTE — Progress Notes (Signed)
Dr Orland Jarredroxler service called gave Dr Winona LegatoVaickute cell number per Dr Winona LegatoVaickute

## 2015-09-08 NOTE — Progress Notes (Signed)
[facility] INFECTIOUS DISEASE PROGRESS NOTE Date of Admission:  09/03/2015     ID: [name] is a 55 y.o. male with osteomyelitis   Active Problems:   Osteomyelitis ([place])   Subjective: S/p surgery 1/9with resection of 1st MT head and soft tissue debridement  ROS  Eleven systems are reviewed and negative except per hpi  Medications:  Antibiotics Given (last 72 hours)    Date/Time Action Medication Dose Rate   09/05/15 1523 Given   piperacillin-tazobactam (ZOSYN) IVPB 3.375 g 3.375 g 12.5 mL/hr   09/05/15 1834 Given   vancomycin (VANCOCIN) 1,250 mg in sodium chloride 0.9 % 250 mL IVPB 1,250 mg 166.7 mL/hr   09/05/15 2039 Given   piperacillin-tazobactam (ZOSYN) IVPB 3.375 g 3.375 g 12.5 mL/hr   09/06/15 0323 Given   vancomycin (VANCOCIN) 1,250 mg in sodium chloride 0.9 % 250 mL IVPB 1,250 mg 166.7 mL/hr   09/06/15 0559 Given   piperacillin-tazobactam (ZOSYN) IVPB 3.375 g 3.375 g 12.5 mL/hr   09/06/15 1224 Given   vancomycin (VANCOCIN) 1,250 mg in sodium chloride 0.9 % 250 mL IVPB 1,250 mg 166.7 mL/hr   09/06/15 1433 Given  [mixed with '240mg'$  of Gentamycin and mixed with antibiotic beads]   vancomycin (VANCOCIN) powder 1 g    09/06/15 1826 Given   vancomycin (VANCOCIN) 1,250 mg in sodium chloride 0.9 % 250 mL IVPB 1,250 mg 166.7 mL/hr   09/06/15 2119 Given   piperacillin-tazobactam (ZOSYN) IVPB 3.375 g 3.375 g 12.5 mL/hr   09/07/15 0216 Given   vancomycin (VANCOCIN) 1,250 mg in sodium chloride 0.9 % 250 mL IVPB 1,250 mg 166.7 mL/hr   09/07/15 1049 Given   vancomycin (VANCOCIN) 1,250 mg in sodium chloride 0.9 % 250 mL IVPB 1,250 mg 166.7 mL/hr   09/07/15 1740 Given   piperacillin-tazobactam (ZOSYN) IVPB 3.375 g 3.375 g 12.5 mL/hr   09/07/15 2044 Given   vancomycin (VANCOCIN) 1,250 mg in sodium chloride 0.9 % 250 mL IVPB 1,250 mg 166.7 mL/hr   09/07/15 2353 Given   piperacillin-tazobactam (ZOSYN) IVPB 3.375 g 3.375 g 12.5 mL/hr   09/08/15 0400 Given   vancomycin  (VANCOCIN) 1,250 mg in sodium chloride 0.9 % 250 mL IVPB 1,250 mg 166.7 mL/hr   09/08/15 0741 Given   piperacillin-tazobactam (ZOSYN) IVPB 3.375 g 3.375 g 12.5 mL/hr   09/08/15 1040 Given   vancomycin (VANCOCIN) 1,250 mg in sodium chloride 0.9 % 250 mL IVPB 1,250 mg 166.7 mL/hr     . docusate sodium  100 mg Oral BID  . enoxaparin (LOVENOX) injection  40 mg Subcutaneous Q24H  . nicotine  14 mg Transdermal Daily  . piperacillin-tazobactam (ZOSYN)  IV  3.375 g Intravenous 3 times per day  . vancomycin  1,250 mg Intravenous Q8H    Objective: Vital signs in last 24 hours: Temp:  [97.7 F (36.5 C)-98.9 F (37.2 C)] 98 F (36.7 C) (01/11 0723) Pulse Rate:  [65-81] 65 (01/11 0723) Resp:  [18-20] 18 (01/11 0723) BP: (128-141)/(69-78) 128/69 mmHg (01/11 0723) SpO2:  [98 %-100 %] 98 % (01/11 0723)  GENERAL: 55 y.o.-year-old patient lying in the bed with no acute distress.  EYES: Pupils equal, round, reactive to light and accommodation. No scleral icterus. Extraocular muscles intact.  HEENT: Head atraumatic, normocephalic. Oropharynx and nasopharynx clear.  NECK: Supple, no jugular venous distention. No thyroid enlargement, no tenderness.  LUNGS: Normal breath sounds bilaterally, no wheezing, rales,rhonchi or crepitation. No use of accessory muscles of respiration.  CARDIOVASCULAR: S1, S2 normal. No  murmurs, rubs, or gallops.  ABDOMEN: Soft, nontender, nondistended. Bowel sounds present. No organomegaly or mass.  EXTREMITIES: No cyanosis, or clubbing. L foot wrapped post op NEUROLOGIC: Cranial nerves II through XII are intact. Muscle strength 5/5 in all extremities.PSYCHIATRIC: The patient is alert and oriented x 3.  SKIN: No obvious rash, lesion, or ulcer.   Lab Results  Recent Labs  09/05/15 1628 09/07/15 0905 09/07/15 1030  HGB  --  12.3*  --   K  --   --  4.0  CREATININE 0.81  --  0.81    Microbiology: Results for orders placed or performed during the hospital  encounter of 09/03/15  Blood culture (routine x 2)     Status: None (Preliminary result)   Collection Time: 09/03/15  9:27 AM  Result Value Ref Range Status   Specimen Description BLOOD RIGHT ARM  Final   Special Requests BOTTLES DRAWN AEROBIC AND ANAEROBIC [id number]  Final   Culture NO GROWTH 4 DAYS  Final   Report Status PENDING  Incomplete  Blood culture (routine x 2)     Status: None (Preliminary result)   Collection Time: 09/03/15  9:34 AM  Result Value Ref Range Status   Specimen Description BLOOD LEFT ARM  Final   Special Requests BOTTLES DRAWN AEROBIC AND ANAEROBIC [place]  Final   Culture NO GROWTH 4 DAYS  Final   Report Status PENDING  Incomplete  Wound culture     Status: None (Preliminary result)   Collection Time: 09/03/15 12:58 PM  Result Value Ref Range Status   Specimen Description ABSCESS  Final   Special Requests Normal  Final   Gram Stain   Final    RARE WBC SEEN MANY GRAM NEGATIVE RODS MODERATE GRAM POSITIVE COCCI RARE GRAM POSITIVE RODS    Culture   Final    MODERATE GROWTH PROTEUS SPECIES HOLDING FOR POSSIBLE ADDITIONAL PATHOGEN    Report Status PENDING  Incomplete   Organism ID, Bacteria PROTEUS SPECIES  Final      Susceptibility   Proteus species - MIC*    AMPICILLIN Value in next row Resistant      RESISTANT>=32    CEFTAZIDIME Value in next row Sensitive      SENSITIVE<=1    CEFTRIAXONE Value in next row Sensitive      SENSITIVE<=1    CIPROFLOXACIN Value in next row Sensitive      SENSITIVE<=0.25    GENTAMICIN Value in next row Sensitive      SENSITIVE<=1    IMIPENEM Value in next row Intermediate      INTERMEDIATE8    TRIMETH/SULFA Value in next row Sensitive      SENSITIVE<=20    PIP/TAZO Value in next row Sensitive      SENSITIVE<=4    * MODERATE GROWTH PROTEUS SPECIES  Surgical pcr screen     Status: None   Collection Time: 09/03/15  7:10 PM  Result Value Ref Range Status   MRSA, PCR NEGATIVE NEGATIVE Final    Staphylococcus aureus NEGATIVE NEGATIVE Final    Comment:        The Xpert SA Assay (FDA approved for NASAL specimens in patients over 42 years of age), is one component of a comprehensive surveillance program.  Test performance has been validated by [facility] for patients greater than or equal to 22 year old. It is not intended to diagnose infection nor to guide or monitor treatment.     Studies/Results: Mr Foot Left W Wo Contrast  09/03/2015  CLINICAL DATA:  Open draining wound along the dorsal aspect of the amputated great toe. History of an amputation 2 months ago. EXAM: MRI OF THE LEFT FOREFOOT WITHOUT AND WITH CONTRAST TECHNIQUE: Multiplanar, multisequence MR imaging was performed both before and after administration of intravenous contrast. CONTRAST:  43m MULTIHANCE GADOBENATE DIMEGLUMINE 529 MG/ML IV SOLN COMPARISON:  Radiographs 09/03/2015 FINDINGS: There is a rim enhancing abscess extending from the first metacarpal head to the skin surface along the dorsum of the great toe. This measures approximately 16 x 10 mm. There is surrounding cellulitis and there is osteomyelitis involving the first metatarsal head and mid distal shaft. The other bony structures are intact. Midfoot degenerative changes but no other areas of osteomyelitis are identified. There is diffuse myositis and cellulitis. IMPRESSION: 1. Osteomyelitis involving the first metatarsal head and mid distal shaft. 2. Soft tissue abscess extending from the head of the first metatarsal to the skin dorsally. 3. Diffuse cellulitis and myositis. Electronically Signed   By: PMarijo SanesM.D.   On: 09/03/2015 15:47   Dg Foot Complete Left  09/03/2015  CLINICAL DATA:  Status post left great toe amputation at Thanksgiving, left foot pain and swelling, purulent drainage shin EXAM: LEFT FOOT - COMPLETE 3+ VIEW COMPARISON:  07/11/2015 FINDINGS: Three views of the left foot submitted the patient is status post amputation of great toe.  There is significant soft tissue swelling at amputation site probable due to infection or cellulitis. Subtle cortical irregularity and focal osteopenia distal aspect of first metatarsal. This is highly suspicious for osteomyelitis. Clinical correlation is necessary. Further correlation with MRI is recommended as clinically warranted. IMPRESSION: Status post amputation of left first toe.There is significant soft tissue swelling at amputation site probable due to infection or cellulitis. Subtle cortical irregularity and focal osteopenia distal aspect of first metatarsal. This is highly suspicious for osteomyelitis. Clinical correlation is necessary. Further correlation with MRI is recommended as clinically warranted. Electronically Signed   By: LLahoma CrockerM.D.   On: 09/03/2015 10:21     Assessment/Plan: [name] PRAJAPATIis a 55y.o. male with tobacco abuse, cocaine abuse, admitted with drainage and pain in great toe at prior amputation site. He had failed to follow up after his last surgery. MRI shows osteomyelitis. Prior cx had grown nml skin flora.  Cx now with Proteus.  CRP 3.6, ESR 26 S/p resection of 1st MT head 1/9   Recommendations   I Dced vanco as no MRSA identified  Continue  zosyn while inpatient but the Proteus is resistant to amp/sulbactam so cannot use augmentin orally At dc would send on 4 weeks oral cipro 500 bid and oral flagyl 500 bid   - will need prolonged course  Remains a poor PICC line candidate given hx substance abuse and noncompliance - I spoke with him regarding this- he denies hx of IVDU but admits to cocaine use Discussed the importance of follow up this time and he has agreed I can see in 4 weeks time in clinic - will need fu Podiatry as well Thank you very much for the consult. Will follow with you.  [place] DRankin  09/08/2015, 1:44 PM

## 2015-09-08 NOTE — Progress Notes (Signed)
[facility] -  at [facility]   PATIENT NAME: [name] [name]    MR#:  [id number]  DATE OF BIRTH:  23-Mar-1961  SUBJECTIVE:  CHIEF COMPLAINT:   Chief Complaint  Patient presents with  . Foot Pain   patient is a 55 year old African male with medical history significant for history of left great toe amputation about 6 weeks ago who failed to return back for follow-up. Came to emergency room on January 6 with draining and swelling of amputation site, patient was evaluated by infectious disease specialist as well as podiatrist and felt that patient needs to undergo infected portion of the bone resection, however, he was found to have positive for cocaine and operation was postponed. Patient's wound cultures are pending. Blood cultures are negative so far. MRSA screen is negative. The patient underwent resection of infected first metatarsal head and the sesamoids and removal of infected soft tissue as well as excisional debridement of the foot 09/06/2015.  Some bleeding today after  work out with the physical therapist. Dr. Lilia Prochocks attempted to aspirate hematoma. However, no hematoma was found. Patient's wound was redressed . OrthoWedge shoe was issued The patient was seen by Dr. Sampson GoonFitzgerald and recommended Cipro and Flagyl orally for 4 weeks. Patient has not been seen by podiatrist yet. In regards to discharge planning    Review of Systems  Constitutional: Negative for fever, chills and weight loss.  HENT: Negative for congestion.   Eyes: Negative for blurred vision and double vision.  Respiratory: Negative for cough, sputum production, shortness of breath and wheezing.   Cardiovascular: Negative for chest pain, palpitations, orthopnea, leg swelling and PND.  Gastrointestinal: Negative for nausea, vomiting, abdominal pain, diarrhea, constipation and blood in stool.  Genitourinary: Negative for dysuria, urgency, frequency and hematuria.  Musculoskeletal: Negative for  falls.  Neurological: Negative for dizziness, tremors, focal weakness and headaches.  Endo/Heme/Allergies: Does not bruise/bleed easily.  Psychiatric/Behavioral: Negative for depression. The patient does not have insomnia.     VITAL SIGNS: Blood pressure 141/80, pulse 80, temperature 98.9 F (37.2 C), temperature source Oral, resp. rate 18, height 6\' 4"  (1.93 m), weight 106.595 kg (235 lb), SpO2 99 %.  PHYSICAL EXAMINATION:   GENERAL:  55 y.o.-year-old patient lying in the bed with no acute distress.  EYES: Pupils equal, round, reactive to light and accommodation. No scleral icterus. Extraocular muscles intact.  HEENT: Head atraumatic, normocephalic. Oropharynx and nasopharynx clear.  NECK:  Supple, no jugular venous distention. No thyroid enlargement, no tenderness.  LUNGS: Normal breath sounds bilaterally, no wheezing, rales,rhonchi or crepitation. No use of accessory muscles of respiration.  CARDIOVASCULAR: S1, S2 normal. No murmurs, rubs, or gallops.  ABDOMEN: Soft, nontender, nondistended. Bowel sounds present. No organomegaly or mass.  EXTREMITIES: No pedal edema, cyanosis, or clubbing. Left foot is dressed, some fresh blood was noted in the distal part of the foot at the bottom of the foot NEUROLOGIC: Cranial nerves II through XII are intact. Muscle strength 5/5 in all extremities. Sensation intact. Gait not checked.  PSYCHIATRIC: The patient is alert and oriented x 3.  SKIN: No obvious rash, lesion, or ulcer.   ORDERS/RESULTS REVIEWED:   CBC  Recent Labs Lab 09/03/15 0927 09/04/15 0311 09/07/15 0905  WBC 4.7 4.2  --   HGB 13.0 12.9* 12.3*  HCT 38.7* 38.1*  --   PLT 257 234  --   MCV 99.8 101.7*  --   MCH 33.7 34.4*  --   MCHC 33.7 33.8  --  RDW 13.6 13.4  --   LYMPHSABS 0.9*  --   --   MONOABS 0.6  --   --   EOSABS 0.4  --   --   BASOSABS 0.0  --   --     ------------------------------------------------------------------------------------------------------------------  Chemistries   Recent Labs Lab 09/03/15 0927 09/04/15 0311 09/05/15 1628 09/07/15 1030  NA 139 139  --   --   K 3.3* 3.7  --  4.0  CL 106 107  --   --   CO2 21* 27  --   --   GLUCOSE 98 102*  --   --   BUN 6 7  --   --   CREATININE 0.65 0.90 0.81 0.81  CALCIUM 9.2 8.9  --   --    ------------------------------------------------------------------------------------------------------------------ estimated creatinine clearance is 134.8 mL/min (by C-G formula based on Cr of 0.81). ------------------------------------------------------------------------------------------------------------------ No results for input(s): TSH, T4TOTAL, T3FREE, THYROIDAB in the last 72 hours.  Invalid input(s): FREET3  Cardiac Enzymes No results for input(s): CKMB, TROPONINI, MYOGLOBIN in the last 168 hours.  Invalid input(s): CK ------------------------------------------------------------------------------------------------------------------ Invalid input(s): POCBNP ---------------------------------------------------------------------------------------------------------------  RADIOLOGY: No results found.  EKG:  Orders placed or performed during the hospital encounter of 07/11/15  . ED EKG  . ED EKG    ASSESSMENT AND PLAN:  Active Problems:   Osteomyelitis (HCC)  #1. 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 Orland Jarred 9th of January,  Continue antibiotic therapy with Cipro and Flagyl for 4 weeks , appreciate Dr. Jarrett Ables input . blood Cultures were negative. Wound culture Proteus species. MRSA PCR is negative. Pain control is adequate with Norco #2. Hypokalemia, resolved with IV fluid administration #3 anemia , stable with rehydration  and postoperatively   #4. Cocaine abuse, discussed with the patient in the past. Patient would benefit from substance abuse program at mental health center, if agreeable, upon discharge #5. Tobacco abuse. Discussed with patient for approximately 5 minutes. Nicotine replacement therapy to be continued   Management plans discussed with the patient, family and they are in agreement.   DRUG ALLERGIES: No Known Allergies  CODE STATUS:     Code Status Orders        Start     Ordered   09/03/15 1341  Full code   Continuous     09/03/15 1340      TOTAL TIME TAKING CARE OF THIS PATIENT: 40 minutes.    Katharina Caper M.D on 09/08/2015 at 4:52 PM  Between 7am to 6pm - Pager - [phone]  After 6pm go to [email or web address] - password [email or web address] [facility]  [facility]  Office  [phone]  CC: Primary care physician; No PCP Per Patient

## 2015-09-09 LAB — SURGICAL PATHOLOGY

## 2015-09-09 LAB — CULTURE, BLOOD (ROUTINE X 2)
CULTURE: NO GROWTH
Culture: NO GROWTH

## 2015-09-09 LAB — CREATININE, SERUM
Creatinine, Ser: 0.89 mg/dL (ref 0.61–1.24)
GFR calc non Af Amer: >60 mL/min (ref >60)

## 2015-09-09 NOTE — Progress Notes (Signed)
Pharmacy Antibiotic Follow-up Note  [name] [name] is a 55 y.o. year-old male admitted on 09/03/2015.  The patient is currently on day 7 of Zosyn 3.375g IV q8h for osteomyelitis.  Assessment/Plan: This patient's current antibiotics will be continued without adjustments.  Per ID notes, continue Zosyn until discharge and then switch to oral Cipro.   Temp (24hrs), Avg:98.4 F (36.9 C), Min:97.6 F (36.4 C), Max:98.9 F (37.2 C)   Recent Labs Lab 09/03/15 0927 09/04/15 0311  WBC 4.7 4.2    Recent Labs Lab 09/03/15 0927 09/04/15 0311 09/05/15 1628 09/07/15 1030 09/09/15 1015  CREATININE 0.65 0.90 0.81 0.81 0.89   Estimated Creatinine Clearance: 122.7 mL/min (by C-G formula based on Cr of 0.89).    No Known Allergies  Antimicrobials this admission: Vancomycin 1/8 >> 1/11 Zosyn 1/6 >>    Thank you for allowing pharmacy to be a part of this patient's care.  Clovia CuffLisa Deedee Lybarger, PharmD, BCPS 09/09/2015 12:34 PM

## 2015-09-09 NOTE — Progress Notes (Signed)
DCd 2 IVs, gave pt a precsrciption for pain med and called his [facility] pharm to verify his other prescriptions are there. Pt verbalized DC instructions and stated he will definitely follow up with MD. Volunteer took pt to lobby where his brother picked him up.

## 2015-09-09 NOTE — Discharge Summary (Signed)
[facility] - [place] at [facility]   PATIENT NAME: [name] [name]    MR#:  [id number]  DATE OF BIRTH:  1961/04/06  DATE OF ADMISSION:  09/03/2015 ADMITTING PHYSICIAN: Enid Baas, MD  DATE OF DISCHARGE: 09/09/2015  3:03 PM  PRIMARY CARE PHYSICIAN: No PCP Per Patient     ADMISSION DIAGNOSIS:  Osteomyelitis (HCC) [M86.9] Left foot infection [L08.9] Osteomyelitis of ankle or foot, acute, left (HCC) [M86.172]  DISCHARGE DIAGNOSIS:  Principal Problem:   Osteomyelitis (HCC) Active Problems:   Infection, Proteus   Cocaine abuse   Anemia   SECONDARY DIAGNOSIS:   Past Medical History  Diagnosis Date  . Toe gangrene (HCC)   . Tobacco use disorder   . Anemia of chronic disease     .pro HOSPITAL COURSE:   Patient is 55 year old African male with medical history significant for history of tobacco abuse, cocaine abuse, who had toe amputated about 2 months ago, presents to the hospital with purulent discharge from the site of operation. Apparently patient has not been following up with podiatry as he was recommended and came into the hospital with weakness, increased swelling around amputation site as well as purulent discharge. He was seen by podiatrist, x-ray revealed osteomyelitis and surgery was recommended. Patient however had to wait for cocaine to be out from the system before he proceeded to surgery on 09/06/2015. Surgery was uneventful and patient did well postoperatively. Immediately after surgery. He ambulated and was noted to have some bleeding from the side requiring aspiration of fluid by Dr. Orland Jarred, however, only 5 cc were aspirated, signifying no hematoma. Patient was continued with physical therapy and recommended no physical therapy. Follow-up. Patient's wound cultures grew Proteus species, although operative cultures were lost. Patient was seen by infectious disease specialist, Dr. Sampson Goon and recommended  4 week therapy with ciprofloxacin  and Flagyl orally.  Patient was felt to be stable to be discharged home today with follow-up With Dr. Orland Jarred as outpatient   discussion by problem #1. 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 Orland Jarred 9th of January, Continue antibiotic therapy with Cipro and Flagyl for 4 weeks , appreciate Dr. Jarrett Ables input . blood Cultures were negative. Wound culture Proteus species. MRSA PCR is negative. Pain control is adequate with Norco #2. Hypokalemia, resolved with IV fluid administration #3 anemia , stable with rehydration and postoperatively  #4. Cocaine abuse, discussed with the patient in the past. Patient would benefit from substance abuse program at mental health center, if agreeable #5. Tobacco abuse. Discussed with patient for approximately 5 minutes. Nicotine replacement therapy to be continued DISCHARGE CONDITIONS:   Stable   CONSULTS OBTAINED:  Treatment Team:  Clydie Braun, MD  DRUG ALLERGIES:  No Known Allergies  DISCHARGE MEDICATIONS:   Discharge Medication List as of 09/09/2015  2:26 PM    START taking these medications   Details  ciprofloxacin (CIPRO) 500 MG tablet Take 1 tablet (500 mg total) by mouth 2 (two) times daily., Starting 09/08/2015, Until Discontinued, Normal    metroNIDAZOLE (FLAGYL) 500 MG tablet Take 1 tablet (500 mg total) by mouth 3 (three) times daily., Starting 09/08/2015, Until Discontinued, Normal    nicotine (NICODERM CQ - DOSED IN MG/24 HOURS) 14 mg/24hr patch Place 1 patch (14 mg total) onto the skin daily., Starting 09/08/2015, Until Discontinued, Normal    polyethylene glycol (MIRALAX / GLYCOLAX) packet Take  17 g by mouth daily as needed for mild constipation., Starting 09/08/2015, Until Discontinued, Normal      CONTINUE these medications which have CHANGED   Details  HYDROcodone-acetaminophen  (NORCO/VICODIN) 5-325 MG tablet Take 1-2 tablets by mouth every 4 (four) hours as needed for moderate pain., Starting 09/08/2015, Until Discontinued, Print      CONTINUE these medications which have NOT CHANGED   Details  docusate sodium (COLACE) 100 MG capsule Take 1 capsule (100 mg total) by mouth 2 (two) times daily., Starting 07/14/2015, Until Discontinued, Normal    naproxen (EC NAPROSYN) 500 MG EC tablet Take 1 tablet (500 mg total) by mouth 2 (two) times daily with a meal., Starting 05/23/2015, Until Discontinued, Print    nicotine polacrilex (NICORETTE) 2 MG gum Take 1 each (2 mg total) by mouth as needed for smoking cessation., Starting 07/14/2015, Until Discontinued, Normal      STOP taking these medications     amoxicillin-clavulanate (AUGMENTIN) 875-125 MG tablet          DISCHARGE INSTRUCTIONS:    Patient is to follow-up with Dr. Orland Jarred and primary care physician as outpatient. He is recommended to follow-up with mental health center substance abuse program as well   If you experience worsening of your admission symptoms, develop shortness of breath, life threatening emergency, suicidal or homicidal thoughts you must seek medical attention immediately by calling 911 or calling your MD immediately  if symptoms less severe.  You Must read complete instructions/literature along with all the possible adverse reactions/side effects for all the Medicines you take and that have been prescribed to you. Take any new Medicines after you have completely understood and accept all the possible adverse reactions/side effects.   Please note  You were cared for by a hospitalist during your hospital stay. If you have any questions about your discharge medications or the care you received while you were in the hospital after you are discharged, you can call the unit and asked to speak with the hospitalist on call if the hospitalist that took care of you is not available. Once you are  discharged, your primary care physician will handle any further medical issues. Please note that NO REFILLS for any discharge medications will be authorized once you are discharged, as it is imperative that you return to your primary care physician (or establish a relationship with a primary care physician if you do not have one) for your aftercare needs so that they can reassess your need for medications and monitor your lab values.    Today   CHIEF COMPLAINT:   Chief Complaint  Patient presents with  . Foot Pain    HISTORY OF PRESENT ILLNESS:  [name] [name]  is a 55 y.o. male with a known history of  tobacco abuse, cocaine abuse, who had toe amputated about 2 months ago, presents to the hospital with purulent discharge from the site of operation. Apparently patient has not been following up with podiatry as he was recommended and came into the hospital with weakness, increased swelling around amputation site as well as purulent discharge. He was seen by podiatrist, x-ray revealed osteomyelitis and surgery was recommended. Patient however had to wait for cocaine to be out from the system before he proceeded to surgery on 09/06/2015. Surgery was uneventful and patient did well postoperatively. Immediately after surgery. He ambulated and was noted to have some bleeding from the side requiring aspiration of fluid by Dr. Orland Jarred, however, only 5 cc were aspirated, signifying  no hematoma. Patient was continued with physical therapy and recommended no physical therapy. Follow-up. Patient's wound cultures grew Proteus species, although operative cultures were lost. Patient was seen by infectious disease specialist, Dr. Sampson Goon and recommended  4 week therapy with ciprofloxacin and Flagyl orally.  Patient was felt to be stable to be discharged home today with follow-up With Dr. Orland Jarred as outpatient   discussion by problem #1. Left foot first metatarsal head osteomyelitis due to Proteus species, status  post resection of infected first metatarsal head and sesamoids and removal of infected soft tissue by Dr. Orland Jarred 9th of January, Continue antibiotic therapy with Cipro and Flagyl for 4 weeks , appreciate Dr. Jarrett Ables input . blood Cultures were negative. Wound culture Proteus species. MRSA PCR is negative. Pain control is adequate with Norco #2. Hypokalemia, resolved with IV fluid administration #3 anemia , stable with rehydration and postoperatively  #4. Cocaine abuse, discussed with the patient in the past. Patient would benefit from substance abuse program at mental health center, if agreeable #5. Tobacco abuse. Discussed with patient for approximately 5 minutes. Nicotine replacement therapy to be continued   VITAL SIGNS:  Blood pressure 147/79, pulse 84, temperature 98 F (36.7 C), temperature source Oral, resp. rate 18, height 6\' 4"  (1.93 m), weight 106.595 kg (235 lb), SpO2 100 %.  I/O:   Intake/Output Summary (Last 24 hours) at 09/09/15 1613 Last data filed at 09/09/15 1336  Gross per 24 hour  Intake    810 ml  Output   1450 ml  Net   -640 ml    PHYSICAL EXAMINATION:  GENERAL:  55 y.o.-year-old patient lying in the bed with no acute distress.  EYES: Pupils equal, round, reactive to light and accommodation. No scleral icterus. Extraocular muscles intact.  HEENT: Head atraumatic, normocephalic. Oropharynx and nasopharynx clear.  NECK:  Supple, no jugular venous distention. No thyroid enlargement, no tenderness.  LUNGS: Normal breath sounds bilaterally, no wheezing, rales,rhonchi or crepitation. No use of accessory muscles of respiration.  CARDIOVASCULAR: S1, S2 normal. No murmurs, rubs, or gallops.  ABDOMEN: Soft, non-tender, non-distended. Bowel sounds present. No organomegaly or mass.  EXTREMITIES: No pedal edema, cyanosis, or clubbing.  NEUROLOGIC: Cranial nerves II through XII are intact. Muscle strength 5/5 in all extremities. Sensation intact. Gait not checked.   PSYCHIATRIC: The patient is alert and oriented x 3.  SKIN: No obvious rash, lesion, or ulcer.   DATA REVIEW:   CBC  Recent Labs Lab 09/04/15 0311 09/07/15 0905  WBC 4.2  --   HGB 12.9* 12.3*  HCT 38.1*  --   PLT 234  --     Chemistries   Recent Labs Lab 09/04/15 0311  09/07/15 1030 09/09/15 1015  NA 139  --   --   --   K 3.7  --  4.0  --   CL 107  --   --   --   CO2 27  --   --   --   GLUCOSE 102*  --   --   --   BUN 7  --   --   --   CREATININE 0.90  < > 0.81 0.89  CALCIUM 8.9  --   --   --   < > = values in this interval not displayed.  Cardiac Enzymes No results for input(s): TROPONINI in the last 168 hours.  Microbiology Results  Results for orders placed or performed during the hospital encounter of 09/03/15  Blood culture (routine x 2)  Status: None   Collection Time: 09/03/15  9:27 AM  Result Value Ref Range Status   Specimen Description BLOOD RIGHT ARM  Final   Special Requests BOTTLES DRAWN AEROBIC AND ANAEROBIC 5CCAERO,7CCANA  Final   Culture NO GROWTH 6 DAYS  Final   Report Status 09/09/2015 FINAL  Final  Blood culture (routine x 2)     Status: None   Collection Time: 09/03/15  9:34 AM  Result Value Ref Range Status   Specimen Description BLOOD LEFT ARM  Final   Special Requests BOTTLES DRAWN AEROBIC AND ANAEROBIC 8CCAERO,5CCANA  Final   Culture NO GROWTH 6 DAYS  Final   Report Status 09/09/2015 FINAL  Final  Wound culture     Status: None (Preliminary result)   Collection Time: 09/03/15 12:58 PM  Result Value Ref Range Status   Specimen Description ABSCESS  Final   Special Requests Normal  Final   Gram Stain   Final    RARE WBC SEEN MANY GRAM NEGATIVE RODS MODERATE GRAM POSITIVE COCCI RARE GRAM POSITIVE RODS    Culture   Final    MODERATE GROWTH PROTEUS SPECIES DIFFICULTY ISOLATING OTHER ORGANISMS DUE TO SWARMING NATURE OF PROTEUS IDENTIFICATION TO FOLLOW ONCE ISOLATED    Report Status PENDING  Incomplete   Organism ID, Bacteria  PROTEUS SPECIES  Final      Susceptibility   Proteus species - MIC*    AMPICILLIN Value in next row Resistant      RESISTANT>=32    CEFTAZIDIME Value in next row Sensitive      SENSITIVE<=1    CEFTRIAXONE Value in next row Sensitive      SENSITIVE<=1    CIPROFLOXACIN Value in next row Sensitive      SENSITIVE<=0.25    GENTAMICIN Value in next row Sensitive      SENSITIVE<=1    IMIPENEM Value in next row Intermediate      INTERMEDIATE8    TRIMETH/SULFA Value in next row Sensitive      SENSITIVE<=20    PIP/TAZO Value in next row Sensitive      SENSITIVE<=4    * MODERATE GROWTH PROTEUS SPECIES  Surgical pcr screen     Status: None   Collection Time: 09/03/15  7:10 PM  Result Value Ref Range Status   MRSA, PCR NEGATIVE NEGATIVE Final   Staphylococcus aureus NEGATIVE NEGATIVE Final    Comment:        The Xpert SA Assay (FDA approved for NASAL specimens in patients over 27 years of age), is one component of a comprehensive surveillance program.  Test performance has been validated by [facility] for patients greater than or equal to 68 year old. It is not intended to diagnose infection nor to guide or monitor treatment.     RADIOLOGY:  No results found.  EKG:   Orders placed or performed during the hospital encounter of 07/11/15  . ED EKG  . ED EKG      Management plans discussed with the patient, family and they are in agreement.  CODE STATUS:     Code Status Orders        Start     Ordered   09/03/15 1341  Full code   Continuous     09/03/15 1340    Code Status History    Date Active Date Inactive Code Status Order ID Comments User Context   07/11/2015  2:44 PM 07/14/2015  9:59 PM Full Code [id number]  Marguarite Arbour, MD Inpatient  TOTAL TIME TAKING CARE OF THIS PATIENT: 40 minutes.    Katharina CaperVAICKUTE,Winona Sison M.D on 09/09/2015 at 4:13 PM  Between 7am to 6pm - Pager - [phone]  After 6pm go to [email or web address] - password [email or web address] [facility]  [place] [place]  [facility]  Office  [phone]  CC: Primary care physician; No PCP Per Patient

## 2015-09-09 NOTE — Progress Notes (Signed)
Patient's surgical specimen was sent to the lab in formalin, thus lab could not process cultures, discussed with microbiology today

## 2015-09-12 LAB — WOUND CULTURE: Special Requests: NORMAL

## 2015-11-22 ENCOUNTER — Emergency Department: Payer: BLUE CROSS/BLUE SHIELD

## 2015-11-22 ENCOUNTER — Emergency Department
Admission: EM | Admit: 2015-11-22 | Discharge: 2015-11-22 | Disposition: A | Discharge status: Home / Self Care | Payer: BLUE CROSS/BLUE SHIELD | Attending: Emergency Medicine | Admitting: Emergency Medicine

## 2015-11-22 DIAGNOSIS — X58XXXA Exposure to other specified factors, initial encounter: Secondary | ICD-10-CM | POA: Insufficient documentation

## 2015-11-22 DIAGNOSIS — S92312A Displaced fracture of first metatarsal bone, left foot, initial encounter for closed fracture: Secondary | ICD-10-CM | POA: Insufficient documentation

## 2015-11-22 DIAGNOSIS — R600 Localized edema: Secondary | ICD-10-CM

## 2015-11-22 DIAGNOSIS — F17210 Nicotine dependence, cigarettes, uncomplicated: Secondary | ICD-10-CM | POA: Diagnosis not present

## 2015-11-22 DIAGNOSIS — Y998 Other external cause status: Secondary | ICD-10-CM | POA: Insufficient documentation

## 2015-11-22 DIAGNOSIS — Y9289 Other specified places as the place of occurrence of the external cause: Secondary | ICD-10-CM | POA: Insufficient documentation

## 2015-11-22 DIAGNOSIS — S92332A Displaced fracture of third metatarsal bone, left foot, initial encounter for closed fracture: Secondary | ICD-10-CM | POA: Insufficient documentation

## 2015-11-22 DIAGNOSIS — Y9389 Activity, other specified: Secondary | ICD-10-CM | POA: Diagnosis not present

## 2015-11-22 DIAGNOSIS — M14672 Charcot's joint, left ankle and foot: Secondary | ICD-10-CM

## 2015-11-22 DIAGNOSIS — R2242 Localized swelling, mass and lump, left lower limb: Secondary | ICD-10-CM | POA: Diagnosis present

## 2015-11-22 LAB — CBC WITH DIFFERENTIAL/PLATELET
BASOS ABS: 0.1 10*3/uL (ref 0–0.1)
Basophils Relative: 1 %
Eosinophils Absolute: 0.5 10*3/uL (ref 0–0.7)
Eosinophils Relative: 8 %
HEMATOCRIT: 38.9 % — AB (ref 40.0–52.0)
HEMOGLOBIN: 13.5 g/dL (ref 13.0–18.0)
LYMPHS PCT: 13 %
Lymphs Abs: 0.8 10*3/uL — ABNORMAL LOW (ref 1.0–3.6)
MCH: 34.9 pg — ABNORMAL HIGH (ref 26.0–34.0)
MCHC: 34.7 g/dL (ref 32.0–36.0)
MCV: 100.7 fL — AB (ref 80.0–100.0)
MONO ABS: 0.6 10*3/uL (ref 0.2–1.0)
MONOS PCT: 10 %
NEUTROS ABS: 4.2 10*3/uL (ref 1.4–6.5)
Neutrophils Relative %: 68 %
Platelets: 338 10*3/uL (ref 150–440)
RBC: 3.86 MIL/uL — ABNORMAL LOW (ref 4.40–5.90)
RDW: 12.9 % (ref 11.5–14.5)
WBC: 6.2 10*3/uL (ref 3.8–10.6)

## 2015-11-22 LAB — COMPREHENSIVE METABOLIC PANEL
ALBUMIN: 4.1 g/dL (ref 3.5–5.0)
ALT: 8 U/L — ABNORMAL LOW (ref 17–63)
ANION GAP: 7 (ref 5–15)
AST: 14 U/L — ABNORMAL LOW (ref 15–41)
Alkaline Phosphatase: 115 U/L (ref 38–126)
BUN: 8 mg/dL (ref 6–20)
CO2: 25 mmol/L (ref 22–32)
Calcium: 8.9 mg/dL (ref 8.9–10.3)
Chloride: 105 mmol/L (ref 101–111)
Creatinine, Ser: 0.73 mg/dL (ref 0.61–1.24)
GFR calc Af Amer: >60 mL/min (ref >60)
GFR calc non Af Amer: >60 mL/min (ref >60)
GLUCOSE: 113 mg/dL — AB (ref 65–99)
Potassium: 3.7 mmol/L (ref 3.5–5.1)
SODIUM: 137 mmol/L (ref 135–145)
TOTAL PROTEIN: 7.3 g/dL (ref 6.5–8.1)
Total Bilirubin: 0.6 mg/dL (ref 0.3–1.2)

## 2015-11-22 LAB — SEDIMENTATION RATE: SED RATE: 35 mm/h — AB (ref 0–20)

## 2015-11-22 MED ORDER — MORPHINE SULFATE (PF) 4 MG/ML IV SOLN
4.0000 mg | Freq: Once | INTRAVENOUS | Status: DC
  Filled 2015-11-22: qty 1

## 2015-11-22 MED ORDER — OXYCODONE-ACETAMINOPHEN 7.5-325 MG PO TABS: 1.0000 | ORAL_TABLET | ORAL | Status: DC | PRN

## 2015-11-22 MED ORDER — PIPERACILLIN-TAZOBACTAM 3.375 G IVPB
3.3750 g | Freq: Once | INTRAVENOUS | Status: DC
  Filled 2015-11-22: qty 50

## 2015-11-22 MED ORDER — VANCOMYCIN HCL IN DEXTROSE 1-5 GM/200ML-% IV SOLN
1000.0000 mg | Freq: Once | INTRAVENOUS | Status: DC
  Filled 2015-11-22: qty 200

## 2015-11-22 NOTE — Discharge Instructions (Signed)
Edema Edema is an abnormal buildup of fluids. It is more common in your legs and thighs. Painless swelling of the feet and ankles is more likely as a person ages. It also is common in looser skin, like around your eyes. HOME CARE   Keep the affected body part above the level of the heart while lying down.  Do not sit still or stand for a long time.  Do not put anything right under your knees when you lie down.  Do not wear tight clothes on your upper legs.  Exercise your legs to help the puffiness (swelling) go down.  Wear elastic bandages or support stockings as told by your doctor.  A low-salt diet may help lessen the puffiness.  Only take medicine as told by your doctor. GET HELP IF:  Treatment is not working.  You have heart, liver, or kidney disease and notice that your skin looks puffy or shiny.  You have puffiness in your legs that does not get better when you raise your legs.  You have sudden weight gain for no reason. GET HELP RIGHT AWAY IF:   You have shortness of breath or chest pain.  You cannot breathe when you lie down.  You have pain, redness, or warmth in the areas that are puffy.  You have heart, liver, or kidney disease and get edema all of a sudden.  You have a fever and your symptoms get worse all of a sudden. MAKE SURE YOU:   Understand these instructions.  Will watch your condition.  Will get help right away if you are not doing well or get worse.   This information is not intended to replace advice given to you by your health care provider. Make sure you discuss any questions you have with your health care provider.   Document Released: 01/31/2008 Document Revised: 08/19/2013 Document Reviewed: 06/06/2013 Elsevier Interactive Patient Education 2016 [employer] Metatarsal Fracture A metatarsal fracture is a break in a metatarsal bone. Metatarsal bones connect your toe bones to your ankle bones. CAUSES This type of fracture may be caused  by:  A sudden twisting of your foot.  A fall onto your foot.  Overuse or repetitive exercise. RISK FACTORS This condition is more likely to develop in people who:  Play contact sports.  Have a bone disease.  Have a low calcium level. SYMPTOMS Symptoms of this condition include:  Pain that is worse when walking or standing.  Pain when pressing on the foot or moving the toes.  Swelling.  Bruising on the top or bottom of the foot.  A foot that appears shorter than the other one. DIAGNOSIS This condition is diagnosed with a physical exam. You may also have imaging tests, such as:  X-rays.  A CT scan.  MRI. TREATMENT Treatment for this condition depends on its severity and whether a bone has moved out of place. Treatment may involve:  Rest.  Wearing foot support such as a cast, splint, or boot for several weeks.  Using crutches.  Surgery to move bones back into the right position. Surgery is usually needed if there are many pieces of broken bone or bones that are very out of place (displaced fracture).  Physical therapy. This may be needed to help you regain full movement and strength in your foot. You will need to return to your health care provider to have X-rays taken until your bones heal. Your health care provider will look at the X-rays to make sure that your  foot is healing well. HOME CARE INSTRUCTIONS  If You Have a Cast:  Do not stick anything inside the cast to scratch your skin. Doing that increases your risk of infection.  Check the skin around the cast every day. Report any concerns to your health care provider. You may put lotion on dry skin around the edges of the cast. Do not apply lotion to the skin underneath the cast.  Keep the cast clean and dry. If You Have a Splint or a Supportive Boot:  Wear it as directed by your health care provider. Remove it only as directed by your health care provider.  Loosen it if your toes become numb and tingle,  or if they turn cold and blue.  Keep it clean and dry. Bathing  Do not take baths, swim, or use a hot tub until your health care provider approves. Ask your health care provider if you can take showers. You may only be allowed to take sponge baths for bathing.  If your health care provider approves bathing and showering, cover the cast or splint with a watertight plastic bag to protect it from water. Do not let the cast or splint get wet. Managing Pain, Stiffness, and Swelling  If directed, apply ice to the injured area (if you have a splint, not a cast).  Put ice in a plastic bag.  Place a towel between your skin and the bag.  Leave the ice on for 20 minutes, 2-3 times per day.  Move your toes often to avoid stiffness and to lessen swelling.  Raise (elevate) the injured area above the level of your heart while you are sitting or lying down. Driving  Do not drive or operate heavy machinery while taking pain medicine.  Do not drive while wearing foot support on a foot that you use for driving. Activity  Return to your normal activities as directed by your health care provider. Ask your health care provider what activities are safe for you.  Perform exercises as directed by your health care provider or physical therapist. Safety  Do not use the injured foot to support your body weight until your health care provider says that you can. Use crutches as directed by your health care provider. General Instructions  Do not put pressure on any part of the cast or splint until it is fully hardened. This may take several hours.  Do not use any tobacco products, including cigarettes, chewing tobacco, or e-cigarettes. Tobacco can delay bone healing. If you need help quitting, ask your health care provider.  Take medicines only as directed by your health care provider.  Keep all follow-up visits as directed by your health care provider. This is important. SEEK MEDICAL CARE IF:  You  have a fever.  Your cast, splint, or boot is too loose or too tight.  Your cast, splint, or boot is damaged.  Your pain medicine is not helping.  You have pain, tingling, or numbness in your foot that is not going away. SEEK IMMEDIATE MEDICAL CARE IF:  You have severe pain.  You have tingling or numbness in your foot that is getting worse.  Your foot feels cold or becomes numb.  Your foot changes color.   This information is not intended to replace advice given to you by your health care provider. Make sure you discuss any questions you have with your health care provider.   Document Released: 05/06/2002 Document Revised: 12/29/2014 Document Reviewed: 06/10/2014 Elsevier Interactive Patient Education 2016 Elsevier  Inc. ° °

## 2015-11-22 NOTE — ED Provider Notes (Signed)
[facility] Emergency Department Provider Note     Time seen: ----------------------------------------- 11:22 AM on 11/22/2015 -----------------------------------------    I have reviewed the triage vital signs and the nursing notes.   HISTORY  Chief Complaint Leg Swelling    HPI [name] [name] is a 55 y.o. male who presents to ER with left lower leg and foot swelling for the past 1-2 weeks. 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 He denies fevers or chills, states she's had a history as before. He also denies chest pain or difficulty breathing, describes paresthesias of the left foot. Nothing makes his symptoms better.   Past Medical History  Diagnosis Date  . Toe gangrene (HCC)   . Tobacco use disorder   . Anemia of chronic disease     Patient Active Problem List   Diagnosis Date Noted  . 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 Anemia 09/08/2015  . Cocaine abuse 09/08/2015  . Osteomyelitis (HCC) 09/03/2015  . Hypokalemia 07/14/2015  . Leukocytosis 07/14/2015  . Anemia of chronic disease 07/14/2015  . Tobacco abuse 07/14/2015  . SIRS (systemic inflammatory response syndrome) (HCC) 07/11/2015  . 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    Past Surgical History  Procedure Laterality Date  . Irrigation and debridement foot Left 07/12/2015    Procedure: IRRIGATION AND DEBRIDEMENT FOOT;  Surgeon: Gwyneth RevelsJustin Fowler, DPM;  Location: [facility] ORS;  Service: Podiatry;  Laterality: Left;  . Amputation toe Left 07/12/2015    Procedure: AMPUTATION TOE;  Surgeon: Gwyneth RevelsJustin Fowler, DPM;  Location: [facility] ORS;  Service: Podiatry;  Laterality: Left;  . Irrigation and debridement foot Left 09/06/2015    Procedure: IRRIGATION AND DEBRIDEMENT FOOT;  Surgeon: Recardo EvangelistMatthew Troxler, DPM;  Location: [facility] ORS;  Service: Podiatry;  Laterality: Left;    Allergies Review of patient's allergies indicates no known allergies.  Social  History Social History  Substance Use Topics  . Smoking status: Current Every Day Smoker -- 0.50 packs/day    Types: Cigarettes  . Smokeless tobacco: None  . Alcohol Use: No    Review of Systems Constitutional: Negative for fever. Eyes: Negative for visual changes. ENT: Negative for sore throat. Cardiovascular: Negative for chest pain. Respiratory: Negative for shortness of breath. Gastrointestinal: Negative for abdominal pain, vomiting and diarrhea. Genitourinary: Negative for dysuria. Musculoskeletal: Positive for left foot and leg swelling Skin: Negative for rash. Neurological: Negative for headaches, with paresthesias noted in the left leg  10-point ROS otherwise negative.  ____________________________________________   PHYSICAL EXAM:  VITAL SIGNS: ED Triage Vitals  Enc Vitals Group     BP 11/22/15 0759 136/72 mmHg     Pulse Rate 11/22/15 0759 82     Resp 11/22/15 0759 17     Temp 11/22/15 0759 97.9 F (36.6 C)     Temp Source 11/22/15 0759 Oral     SpO2 11/22/15 0759 97 %     Weight 11/22/15 0759 242 lb (109.77 kg)     Height 11/22/15 0759 6\' 4"  (1.93 m)     Head Cir --      Peak Flow --      Pain Score 11/22/15 0800 6     Pain Loc --      Pain Edu? --      Excl. in GC? --     Constitutional: Alert and oriented. Well appearing and in no distress. Eyes: Conjunctivae are normal. PERRL. Normal extraocular movements. ENT   Head: Normocephalic and atraumatic.  Nose: No congestion/rhinnorhea.   Mouth/Throat: Mucous membranes are moist.   Neck: No stridor. Cardiovascular: Normal rate, regular rhythm. Normal and symmetric distal pulses are present in all extremities. No murmurs, rubs, or gallops. Respiratory: Normal respiratory effort without tachypnea nor retractions. Breath sounds are clear and equal bilaterally. No wheezes/rales/rhonchi. Gastrointestinal: Soft and nontender. No distention. No abdominal bruits.  Musculoskeletal: Left lower  extremity does appear enlarged compared to the right, there is some edema, market swelling of the left foot diffusely with erythema noted medially. Neurologic:  Normal speech and language. No gross focal neurologic deficits are appreciated.  Skin: skin erythema and edema is noted in the left foot and ankle Psychiatric: Mood and affect are normal. Speech and behavior are normal. Patient exhibits appropriate insight and judgment. ____________________________________________  ED COURSE:  Pertinent labs & imaging results that were available during my care of the patient were reviewed by me and considered in my medical decision making (see chart for details).  patient clinically concerning for osteomyelitis. We will obtain basic x-rays, sedimentation rate, basic labs. He will receive IV antibiotics, also check for DVT. ____________________________________________    LABS (pertinent positives/negatives)  Labs Reviewed  COMPREHENSIVE METABOLIC PANEL - Abnormal; Notable for the following:    Glucose, Bld 113 (*)    AST 14 (*)    ALT 8 (*)    All other components within normal limits  CBC WITH DIFFERENTIAL/PLATELET - Abnormal; Notable for the following:    RBC 3.86 (*)    HCT 38.9 (*)    MCV 100.7 (*)    MCH 34.9 (*)    Lymphs Abs 0.8 (*)    All other components within normal limits  SEDIMENTATION RATE - Abnormal; Notable for the following:    Sed Rate 35 (*)    All other components within normal limits    RADIOLOGY Images were viewed by me  right foot x-rays, ultrasound of the left lower extremity  IMPRESSION: 1. Interval amputation through the first metatarsal. No evidence of recurrent osteomyelitis. 2. 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 Namely, there are subacute fractures involving the base of the first metatarsal and third metatarsal neck with subluxation at the Lisfranc joint and second MTP joints. 3. Diffuse forefoot soft tissue swelling, nonspecific.  This may be related to Charcot arthropathy or soft tissue infection. IMPRESSION: 1. No evidence of lower extremity deep vein thrombosis, left. 2. Prominent left inguinal lymph nodes, possibly reactive but nonspecific.  ____________________________________________  FINAL ASSESSMENT AND PLAN:  Edema, first metatarsal and third metatarsal fractures, Charcot foot  Plan: Patient with labs and imaging as dictated above. Patient is in no acute distress, will have reviewed the case with podiatry. Dr. Ether Griffins has reviewed his x-ray does not feel like this is osteomyelitis, this resembles a Charcot foot. He'll be given pain medicine, he is advised nonweightbearing and is given crutches. He is advised to have close outpatient follow-up with podiatry.   Emily Filbert, MD   Emily Filbert, MD 11/22/15 [phone]

## 2015-11-22 NOTE — ED Notes (Signed)
Patient transported to X-ray 

## 2015-11-22 NOTE — ED Notes (Signed)
Pt c/o left lower leg swelling into the foot for the past week. Denies having any open wounds.. Pt has a hx of toe amputation on the same leg..Marland Kitchen

## 2016-08-04 ENCOUNTER — Emergency Department: Payer: BLUE CROSS/BLUE SHIELD

## 2016-08-04 ENCOUNTER — Emergency Department
Admission: EM | Admit: 2016-08-04 | Discharge: 2016-08-04 | Disposition: A | Discharge status: Home / Self Care | Payer: BLUE CROSS/BLUE SHIELD | Attending: Emergency Medicine | Admitting: Emergency Medicine

## 2016-08-04 DIAGNOSIS — Z791 Long term (current) use of non-steroidal anti-inflammatories (NSAID): Secondary | ICD-10-CM | POA: Insufficient documentation

## 2016-08-04 DIAGNOSIS — M869 Osteomyelitis, unspecified: Secondary | ICD-10-CM

## 2016-08-04 DIAGNOSIS — Z79899 Other long term (current) drug therapy: Secondary | ICD-10-CM | POA: Diagnosis not present

## 2016-08-04 DIAGNOSIS — L97519 Non-pressure chronic ulcer of other part of right foot with unspecified severity: Secondary | ICD-10-CM | POA: Insufficient documentation

## 2016-08-04 DIAGNOSIS — F17210 Nicotine dependence, cigarettes, uncomplicated: Secondary | ICD-10-CM | POA: Insufficient documentation

## 2016-08-04 DIAGNOSIS — M86171 Other acute osteomyelitis, right ankle and foot: Secondary | ICD-10-CM | POA: Insufficient documentation

## 2016-08-04 DIAGNOSIS — L089 Local infection of the skin and subcutaneous tissue, unspecified: Secondary | ICD-10-CM | POA: Diagnosis present

## 2016-08-04 LAB — COMPREHENSIVE METABOLIC PANEL
ALBUMIN: 3.7 g/dL (ref 3.5–5.0)
ALT: 11 U/L — ABNORMAL LOW (ref 17–63)
ANION GAP: 9 (ref 5–15)
AST: 14 U/L — ABNORMAL LOW (ref 15–41)
Alkaline Phosphatase: 61 U/L (ref 38–126)
BUN: 8 mg/dL (ref 6–20)
CO2: 26 mmol/L (ref 22–32)
Calcium: 9.3 mg/dL (ref 8.9–10.3)
Chloride: 102 mmol/L (ref 101–111)
Creatinine, Ser: 0.75 mg/dL (ref 0.61–1.24)
GFR calc non Af Amer: >60 mL/min (ref >60)
GLUCOSE: 98 mg/dL (ref 65–99)
POTASSIUM: 3.9 mmol/L (ref 3.5–5.1)
SODIUM: 137 mmol/L (ref 135–145)
TOTAL PROTEIN: 7.7 g/dL (ref 6.5–8.1)
Total Bilirubin: 0.7 mg/dL (ref 0.3–1.2)

## 2016-08-04 LAB — CBC
HCT: 36.6 % — ABNORMAL LOW (ref 40.0–52.0)
Hemoglobin: 12.8 g/dL — ABNORMAL LOW (ref 13.0–18.0)
MCH: 34.6 pg — AB (ref 26.0–34.0)
MCHC: 35 g/dL (ref 32.0–36.0)
MCV: 99 fL (ref 80.0–100.0)
Platelets: 352 10*3/uL (ref 150–440)
RBC: 3.7 MIL/uL — ABNORMAL LOW (ref 4.40–5.90)
RDW: 12.1 % (ref 11.5–14.5)
WBC: 7 10*3/uL (ref 3.8–10.6)

## 2016-08-04 MED ORDER — SULFAMETHOXAZOLE-TRIMETHOPRIM 800-160 MG PO TABS: 1.0000 | ORAL_TABLET | Freq: Two times a day (BID) | ORAL | 0 refills | Status: DC

## 2016-08-04 MED ORDER — HYDROCODONE-ACETAMINOPHEN 5-325 MG PO TABS: 1.0000 | ORAL_TABLET | ORAL | 0 refills | Status: DC | PRN

## 2016-08-04 MED ORDER — SULFAMETHOXAZOLE-TRIMETHOPRIM 800-160 MG PO TABS
1.0000 | ORAL_TABLET | Freq: Once | ORAL | Status: AC
  Administered 2016-08-04: 1 via ORAL
  Filled 2016-08-04: qty 1

## 2016-08-04 MED ORDER — HYDROCODONE-ACETAMINOPHEN 5-325 MG PO TABS
2.0000 | ORAL_TABLET | Freq: Once | ORAL | Status: AC
  Administered 2016-08-04: 2 via ORAL
  Filled 2016-08-04: qty 2

## 2016-08-04 NOTE — ED Triage Notes (Signed)
Pt came to ED via pov c/o pain in left foot. Pt feet is swollen and pt reports "hole in bottom on foot." Pt reports had left big toe amuptated last year. Rates pain 8/10.

## 2016-08-04 NOTE — ED Notes (Signed)
Pt states he noticed the ulcer about a week ago and has been draining. Pt denies being diabetic. Pt with hx of left great toe amputation.

## 2016-08-04 NOTE — Discharge Instructions (Signed)
You have been seen for an ulceration of the right foot which has a small portion of bone infection as well. Please take your antibiotics as prescribed for their entire course. Please call the number to arrange a follow-up appointment with wound clinic as well as podiatry for further evaluation. Return to the emergency department for any fever, or signs of worsening/spreading infection, or any other symptom personally concerning to yourself.

## 2016-08-04 NOTE — ED Notes (Signed)
Pt calling for a ride home

## 2016-08-04 NOTE — ED Provider Notes (Signed)
[facility] Emergency Department Provider Note  Time seen: 8:20 AM  I have reviewed the triage vital signs and the nursing notes.   HISTORY  Chief Complaint Wound Infection    HPI [name] [name] is a 55 y.o. male who presents to the emergency department with a right foot infection. According to the patient he has had an ulcer to the bottom of the right foot for weeks or months per patient. States over the past few days it has been leaking a very foul-smelling fluid. Patient states he finally came to the emergency department for evaluation. Denies fever, nausea, vomiting. Patient states similar ulceration to his left foot several years ago requiring a toe amputation. Denies diabetes. Patient states 7/10 pain in the right foot. Dull aching pain.  Past Medical History:  Diagnosis Date  . Anemia of chronic disease   . Tobacco use disorder   . Toe gangrene [facility](HCC)     Patient Active Problem List   Diagnosis Date Noted  . 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 Anemia 09/08/2015  . Cocaine abuse 09/08/2015  . Osteomyelitis (HCC) 09/03/2015  . Hypokalemia 07/14/2015  . Leukocytosis 07/14/2015  . Anemia of chronic disease 07/14/2015  . Tobacco abuse 07/14/2015  . SIRS (systemic inflammatory response syndrome) (HCC) 07/11/2015  . 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 AMPUTATION TOE Left 07/12/2015   Procedure: AMPUTATION TOE;  Surgeon: Gwyneth RevelsJustin Fowler, DPM;  Location: [facility] ORS;  Service: Podiatry;  Laterality: Left;  . IRRIGATION AND DEBRIDEMENT FOOT Left 07/12/2015   Procedure: IRRIGATION AND DEBRIDEMENT FOOT;  Surgeon: Gwyneth RevelsJustin Fowler, DPM;  Location: [facility] ORS;  Service: Podiatry;  Laterality: Left;  . IRRIGATION AND DEBRIDEMENT FOOT Left 09/06/2015   Procedure: IRRIGATION AND DEBRIDEMENT FOOT;  Surgeon: Recardo EvangelistMatthew Troxler, DPM;  Location: [facility] ORS;  Service: Podiatry;  Laterality: Left;    Prior to Admission medications    Medication Sig Start Date End Date Taking? Authorizing Provider  ciprofloxacin (CIPRO) 500 MG tablet Take 1 tablet (500 mg total) by mouth 2 (two) times daily. 09/08/15   Katharina Caperima Vaickute, MD  docusate sodium (COLACE) 100 MG capsule Take 1 capsule (100 mg total) by mouth 2 (two) times daily. Patient not taking: Reported on 09/03/2015 07/14/15   Katharina Caperima Vaickute, MD  HYDROcodone-acetaminophen (NORCO/VICODIN) 5-325 MG tablet Take 1-2 tablets by mouth every 4 (four) hours as needed for moderate pain. 09/08/15   Katharina Caperima Vaickute, MD  metroNIDAZOLE (FLAGYL) 500 MG tablet Take 1 tablet (500 mg total) by mouth 3 (three) times daily. 09/08/15   Katharina Caperima Vaickute, MD  naproxen (EC NAPROSYN) 500 MG EC tablet Take 1 tablet (500 mg total) by mouth 2 (two) times daily with a meal. Patient not taking: Reported on 07/11/2015 05/23/15   Jenise V Bacon Menshew, PA-C  nicotine (NICODERM CQ - DOSED IN MG/24 HOURS) 14 mg/24hr patch Place 1 patch (14 mg total) onto the skin daily. 09/08/15   Katharina Caperima Vaickute, MD  nicotine polacrilex (NICORETTE) 2 MG gum Take 1 each (2 mg total) by mouth as needed for smoking cessation. Patient not taking: Reported on 09/03/2015 07/14/15   Katharina Caperima Vaickute, MD  oxyCODONE-acetaminophen (PERCOCET) 7.5-325 MG tablet Take 1 tablet by mouth every 4 (four) hours as needed for severe pain. 11/22/15 11/21/16  Emily FilbertJonathan E Williams, MD  polyethylene glycol [facility](MIRALAX / Ethelene HalGLYCOLAX) packet Take 17 g by mouth daily as needed for mild constipation. 09/08/15   Katharina Caperima Vaickute, MD    No Known  Allergies  Family History  Problem Relation Age of Onset  . CVA Mother   . CVA Brother   . Diabetes Mellitus II Father     Social History Social History  Substance Use Topics  . Smoking status: Current Every Day Smoker    Packs/day: 0.50    Types: Cigarettes  . Smokeless tobacco: Never Used  . Alcohol use 0.0 oz/week     Comment: ocasionally    Review of Systems Constitutional: Negative for fever. Cardiovascular: Negative for  chest pain. Respiratory: Negative for shortness of breath. Gastrointestinal: Negative for abdominal pain. Denies vomiting. Skin: Right foot ulceration Neurological: Negative for headache 10-point ROS otherwise negative.  ____________________________________________   PHYSICAL EXAM:  VITAL SIGNS: ED Triage Vitals [08/04/16 0801]  Enc Vitals Group     BP 123/68     Pulse Rate 88     Resp 16     Temp 98 F (36.7 C)     Temp Source Oral     SpO2 98 %     Weight 265 lb (120.2 kg)     Height 6\' 4"  (1.93 m)     Head Circumference      Peak Flow      Pain Score      Pain Loc      Pain Edu?      Excl. in GC?     Constitutional: Alert and oriented. Well appearing and in no distress. Eyes: Normal exam ENT   Head: Normocephalic and atraumatic.   Mouth/Throat: Mucous membranes are moist. Cardiovascular: Normal rate, regular rhythm. No murmur Respiratory: Normal respiratory effort without tachypnea nor retractions. Breath sounds are clear Gastrointestinal: Soft and nontender. No distention. Musculoskeletal: Small approximate 1 cm ulceration to the plantar asked back of the right foot with minimal surrounding induration, no drainage presently. Patient also has a very small ulceration to the right fifth toe less than 0.5 cm in diameter, no active drainage. Ulcerations are foul smelling, mild pedal edema, tenderness around the ulceration to the plantar aspect of the foot. Neurovascular intact. Neurologic:  Normal speech and language. No gross focal neurologic deficits Skin:  Skin is warm, dry, ulceration as described above. Psychiatric: Mood and affect are normal.   ____________________________________________   RADIOLOGY  X-ray consistent with osteomyelitis of the head of the second metatarsal.  ____________________________________________   INITIAL IMPRESSION / ASSESSMENT AND PLAN / ED COURSE  Pertinent labs & imaging results that were available during my care of the  patient were reviewed by me and considered in my medical decision making (see chart for details).  Patient presents for a foot ulceration which has been present for several weeks or months per patient, now leaking drainage per patient. No signs of abscess, no apparent drainable collection. We will obtain an x-ray to rule out osteomyelitis. We will obtain basic labs. If workup is within normal limits we will place the patient on antibiotics, pain medication and have him follow up with wound clinic. Patient is agreeable to this plan.  X-ray consistent with osteomyelitis of the head of the second metatarsal. Labs are otherwise within normal limits including normal white blood cell count. Normal glucose. Patient will be started on Bactrim which will be continued for the next 2 weeks. We'll have patient follow-up both with podiatry as well as wound clinic. Patient is agreeable to this plan. I discussed return precautions with the patient for any fever, vomiting and worsening pain or apparent spreading of the ulcer or swelling. Patient is  agreeable.  ____________________________________________   FINAL CLINICAL IMPRESSION(S) / ED DIAGNOSES  Foot ulceration osteomyelitis    Minna Antis, MD 08/04/16 [phone]

## 2016-08-07 ENCOUNTER — Ambulatory Visit: Payer: BLUE CROSS/BLUE SHIELD | Admitting: Surgery

## 2016-09-19 ENCOUNTER — Emergency Department: Payer: Self-pay

## 2016-09-19 ENCOUNTER — Encounter: Payer: Self-pay | Admitting: Emergency Medicine

## 2016-09-19 ENCOUNTER — Inpatient Hospital Stay
Admission: EM | Admit: 2016-09-19 | Discharge: 2016-09-25 | DRG: 617 | Disposition: A | Discharge status: Home / Self Care | Payer: Self-pay | Attending: Internal Medicine | Admitting: Internal Medicine

## 2016-09-19 DIAGNOSIS — E1169 Type 2 diabetes mellitus with other specified complication: Principal | ICD-10-CM | POA: Diagnosis present

## 2016-09-19 DIAGNOSIS — Z79891 Long term (current) use of opiate analgesic: Secondary | ICD-10-CM

## 2016-09-19 DIAGNOSIS — M86171 Other acute osteomyelitis, right ankle and foot: Secondary | ICD-10-CM | POA: Diagnosis present

## 2016-09-19 DIAGNOSIS — E11621 Type 2 diabetes mellitus with foot ulcer: Secondary | ICD-10-CM | POA: Diagnosis present

## 2016-09-19 DIAGNOSIS — E1151 Type 2 diabetes mellitus with diabetic peripheral angiopathy without gangrene: Secondary | ICD-10-CM | POA: Diagnosis present

## 2016-09-19 DIAGNOSIS — F17210 Nicotine dependence, cigarettes, uncomplicated: Secondary | ICD-10-CM | POA: Diagnosis present

## 2016-09-19 DIAGNOSIS — Z79899 Other long term (current) drug therapy: Secondary | ICD-10-CM

## 2016-09-19 DIAGNOSIS — R262 Difficulty in walking, not elsewhere classified: Secondary | ICD-10-CM

## 2016-09-19 DIAGNOSIS — M6281 Muscle weakness (generalized): Secondary | ICD-10-CM

## 2016-09-19 DIAGNOSIS — B951 Streptococcus, group B, as the cause of diseases classified elsewhere: Secondary | ICD-10-CM | POA: Diagnosis present

## 2016-09-19 DIAGNOSIS — E1140 Type 2 diabetes mellitus with diabetic neuropathy, unspecified: Secondary | ICD-10-CM | POA: Diagnosis present

## 2016-09-19 DIAGNOSIS — L97509 Non-pressure chronic ulcer of other part of unspecified foot with unspecified severity: Secondary | ICD-10-CM | POA: Diagnosis present

## 2016-09-19 DIAGNOSIS — D638 Anemia in other chronic diseases classified elsewhere: Secondary | ICD-10-CM | POA: Diagnosis present

## 2016-09-19 DIAGNOSIS — M869 Osteomyelitis, unspecified: Secondary | ICD-10-CM

## 2016-09-19 DIAGNOSIS — F1010 Alcohol abuse, uncomplicated: Secondary | ICD-10-CM | POA: Diagnosis present

## 2016-09-19 LAB — COMPREHENSIVE METABOLIC PANEL
ALK PHOS: 70 U/L (ref 38–126)
ALT: 10 U/L — ABNORMAL LOW (ref 17–63)
ANION GAP: 9 (ref 5–15)
AST: 14 U/L — ABNORMAL LOW (ref 15–41)
Albumin: 3.9 g/dL (ref 3.5–5.0)
BILIRUBIN TOTAL: 1 mg/dL (ref 0.3–1.2)
BUN: 6 mg/dL (ref 6–20)
CALCIUM: 9.2 mg/dL (ref 8.9–10.3)
CO2: 26 mmol/L (ref 22–32)
Chloride: 102 mmol/L (ref 101–111)
Creatinine, Ser: 0.71 mg/dL (ref 0.61–1.24)
GFR calc non Af Amer: >60 mL/min (ref >60)
Glucose, Bld: 110 mg/dL — ABNORMAL HIGH (ref 65–99)
Potassium: 3.8 mmol/L (ref 3.5–5.1)
SODIUM: 137 mmol/L (ref 135–145)
TOTAL PROTEIN: 7.6 g/dL (ref 6.5–8.1)

## 2016-09-19 LAB — CBC WITH DIFFERENTIAL/PLATELET
Basophils Absolute: 0 10*3/uL (ref 0–0.1)
Basophils Relative: 1 %
EOS ABS: 0.1 10*3/uL (ref 0–0.7)
Eosinophils Relative: 1 %
HCT: 39.1 % — ABNORMAL LOW (ref 40.0–52.0)
HEMOGLOBIN: 13.4 g/dL (ref 13.0–18.0)
LYMPHS ABS: 0.9 10*3/uL — AB (ref 1.0–3.6)
Lymphocytes Relative: 9 %
MCH: 33.6 pg (ref 26.0–34.0)
MCHC: 34.2 g/dL (ref 32.0–36.0)
MCV: 98 fL (ref 80.0–100.0)
MONOS PCT: 8 %
Monocytes Absolute: 0.8 10*3/uL (ref 0.2–1.0)
NEUTROS PCT: 81 %
Neutro Abs: 8.3 10*3/uL — ABNORMAL HIGH (ref 1.4–6.5)
Platelets: 319 10*3/uL (ref 150–440)
RBC: 3.99 MIL/uL — ABNORMAL LOW (ref 4.40–5.90)
RDW: 13.1 % (ref 11.5–14.5)
WBC: 10.3 10*3/uL (ref 3.8–10.6)

## 2016-09-19 MED ORDER — LORAZEPAM 2 MG/ML IJ SOLN: 1.0000 mg | Freq: Four times a day (QID) | INTRAMUSCULAR | Status: AC | PRN

## 2016-09-19 MED ORDER — ENOXAPARIN SODIUM 40 MG/0.4ML ~~LOC~~ SOLN
40.0000 mg | SUBCUTANEOUS | Status: DC
  Administered 2016-09-19 – 2016-09-24 (×6): 40 mg via SUBCUTANEOUS
  Filled 2016-09-19 (×6): qty 0.4

## 2016-09-19 MED ORDER — PIPERACILLIN-TAZOBACTAM 3.375 G IVPB
3.3750 g | Freq: Three times a day (TID) | INTRAVENOUS | Status: DC
  Administered 2016-09-19 – 2016-09-25 (×18): 3.375 g via INTRAVENOUS
  Filled 2016-09-19 (×18): qty 50

## 2016-09-19 MED ORDER — THIAMINE HCL 100 MG/ML IJ SOLN: 100.0000 mg | Freq: Every day | INTRAMUSCULAR | Status: DC

## 2016-09-19 MED ORDER — SENNOSIDES-DOCUSATE SODIUM 8.6-50 MG PO TABS
1.0000 | ORAL_TABLET | Freq: Every evening | ORAL | Status: DC | PRN
  Administered 2016-09-23: 1 via ORAL
  Filled 2016-09-19: qty 1

## 2016-09-19 MED ORDER — NICOTINE 14 MG/24HR TD PT24
14.0000 mg | MEDICATED_PATCH | Freq: Every day | TRANSDERMAL | Status: DC
  Filled 2016-09-19 (×3): qty 1

## 2016-09-19 MED ORDER — VANCOMYCIN HCL IN DEXTROSE 1-5 GM/200ML-% IV SOLN
1000.0000 mg | Freq: Once | INTRAVENOUS | Status: AC
  Administered 2016-09-19: 1000 mg via INTRAVENOUS
  Filled 2016-09-19: qty 200

## 2016-09-19 MED ORDER — ALBUTEROL SULFATE (2.5 MG/3ML) 0.083% IN NEBU: 2.5000 mg | INHALATION_SOLUTION | RESPIRATORY_TRACT | Status: DC | PRN

## 2016-09-19 MED ORDER — HYDROCODONE-ACETAMINOPHEN 5-325 MG PO TABS
1.0000 | ORAL_TABLET | ORAL | Status: DC | PRN
  Administered 2016-09-20: 2 via ORAL
  Filled 2016-09-19: qty 2

## 2016-09-19 MED ORDER — BISACODYL 5 MG PO TBEC
5.0000 mg | DELAYED_RELEASE_TABLET | Freq: Every day | ORAL | Status: DC | PRN
  Administered 2016-09-23: 5 mg via ORAL
  Filled 2016-09-19: qty 1

## 2016-09-19 MED ORDER — ACETAMINOPHEN 325 MG PO TABS
650.0000 mg | ORAL_TABLET | Freq: Four times a day (QID) | ORAL | Status: DC | PRN
Start: 2016-09-19 — End: 2016-09-25

## 2016-09-19 MED ORDER — VANCOMYCIN HCL IN DEXTROSE 1-5 GM/200ML-% IV SOLN
1000.0000 mg | Freq: Three times a day (TID) | INTRAVENOUS | Status: DC
  Administered 2016-09-19 – 2016-09-20 (×5): 1000 mg via INTRAVENOUS
  Filled 2016-09-19 (×6): qty 200

## 2016-09-19 MED ORDER — ONDANSETRON HCL 4 MG PO TABS: 4.0000 mg | ORAL_TABLET | Freq: Four times a day (QID) | ORAL | Status: DC | PRN

## 2016-09-19 MED ORDER — ADULT MULTIVITAMIN W/MINERALS CH
1.0000 | ORAL_TABLET | Freq: Every day | ORAL | Status: DC
  Administered 2016-09-19 – 2016-09-25 (×7): 1 via ORAL
  Filled 2016-09-19 (×7): qty 1

## 2016-09-19 MED ORDER — ONDANSETRON HCL 4 MG/2ML IJ SOLN
4.0000 mg | Freq: Four times a day (QID) | INTRAMUSCULAR | Status: DC | PRN
  Administered 2016-09-22: 4 mg via INTRAVENOUS

## 2016-09-19 MED ORDER — KETOROLAC TROMETHAMINE 30 MG/ML IJ SOLN: 30.0000 mg | Freq: Four times a day (QID) | INTRAMUSCULAR | Status: AC | PRN

## 2016-09-19 MED ORDER — CIPROFLOXACIN IN D5W 400 MG/200ML IV SOLN
400.0000 mg | Freq: Once | INTRAVENOUS | Status: AC
  Administered 2016-09-19: 400 mg via INTRAVENOUS
  Filled 2016-09-19: qty 200

## 2016-09-19 MED ORDER — LORAZEPAM 1 MG PO TABS: 1.0000 mg | ORAL_TABLET | Freq: Four times a day (QID) | ORAL | Status: AC | PRN

## 2016-09-19 MED ORDER — FOLIC ACID 1 MG PO TABS
1.0000 mg | ORAL_TABLET | Freq: Every day | ORAL | Status: DC
  Administered 2016-09-19 – 2016-09-25 (×7): 1 mg via ORAL
  Filled 2016-09-19 (×7): qty 1

## 2016-09-19 MED ORDER — ACETAMINOPHEN 650 MG RE SUPP: 650.0000 mg | Freq: Four times a day (QID) | RECTAL | Status: DC | PRN

## 2016-09-19 MED ORDER — VITAMIN B-1 100 MG PO TABS
100.0000 mg | ORAL_TABLET | Freq: Every day | ORAL | Status: DC
  Administered 2016-09-19 – 2016-09-25 (×7): 100 mg via ORAL
  Filled 2016-09-19 (×7): qty 1

## 2016-09-19 NOTE — Progress Notes (Signed)
Pharmacy Antibiotic Note  [name] [name] is a 56 y.o. male admitted on 09/19/2016 with osteomyelitis.  Pharmacy has been consulted for vancomycin and Zosyn dosing.  Plan: Vancomycin 1000 mg IV every 8 hours.  Goal trough 15-20 mcg/mL. Zosyn 3.375g IV q8h (4 hour infusion).   Ke 0.107, half life 6.48, Vd 76.2 L Trough before 5th dose of regimen  Height: 6\' 4"  (193 cm) Weight: 240 lb (108.9 kg) IBW/kg (Calculated) : 86.8  Temp (24hrs), Avg:98.5 F (36.9 C), Min:98.2 F (36.8 C), Max:98.7 F (37.1 C)   Recent Labs Lab 09/19/16 0814  WBC 10.3  CREATININE 0.71    Estimated Creatinine Clearance (by C-G formula based on SCr of 0.71 mg/dL) Male: 161.0112.4 mL/min Male: 136.1 mL/min    No Known Allergies  Antimicrobials this admission: cipro x1 1/23 Vanc/zosyn 1/23 >>  Dose adjustments this admission:   Microbiology results: 1/23 BCx x2 sent 1/23 WCx sent    Thank you for allowing pharmacy to be a part of this patient's care.  Marty HeckWang, Nickola Lenig L 09/19/2016 11:49 AM

## 2016-09-19 NOTE — ED Provider Notes (Signed)
[facility] Emergency Department Provider Note   ____________________________________________   I have reviewed the triage vital signs and the nursing notes.   HISTORY  Chief Complaint Toe Pain   History limited by: Not Limited   HPI [name] [name] is a 56 y.o. male who presents to the emergency department today because of concern for right second toe pain and swelling. These symptoms have been present for roughly 1 month. They have been progressively getting worse. The patient was seen in the emergency department for an ulcer to the bottom of his right lip a few weeks ago. He was given and Biaxin. He states since that time the swelling is gone worse. 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 Recently saw Dr. Orland Jarred with podiatry. Patient states he has not tried contacting his podiatrist about the swelling. He denies any fevers.   Past Medical History:  Diagnosis Date  . Anemia of chronic disease   . Tobacco use disorder   . Toe gangrene [facility])     Patient Active Problem List   Diagnosis Date Noted  . 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 Anemia 09/08/2015  . Cocaine abuse 09/08/2015  . Osteomyelitis (HCC) 09/03/2015  . Hypokalemia 07/14/2015  . Leukocytosis 07/14/2015  . Anemia of chronic disease 07/14/2015  . Tobacco abuse 07/14/2015  . SIRS (systemic inflammatory response syndrome) (HCC) 07/11/2015  . 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 AMPUTATION TOE Left 07/12/2015   Procedure: AMPUTATION TOE;  Surgeon: Gwyneth Revels, DPM;  Location: [facility] ORS;  Service: Podiatry;  Laterality: Left;  . IRRIGATION AND DEBRIDEMENT FOOT Left 07/12/2015   Procedure: IRRIGATION AND DEBRIDEMENT FOOT;  Surgeon: Gwyneth Revels, DPM;  Location: [facility] ORS;  Service: Podiatry;  Laterality: Left;  . IRRIGATION AND DEBRIDEMENT FOOT Left 09/06/2015   Procedure: IRRIGATION AND DEBRIDEMENT FOOT;  Surgeon: Recardo Evangelist, DPM;  Location: [facility] ORS;  Service: Podiatry;  Laterality: Left;    Prior to Admission medications   Medication Sig Start Date End Date Taking? Authorizing Provider  ciprofloxacin (CIPRO) 500 MG tablet Take 1 tablet (500 mg total) by mouth 2 (two) times daily. 09/08/15   Katharina Caper, MD  docusate sodium (COLACE) 100 MG capsule Take 1 capsule (100 mg total) by mouth 2 (two) times daily. Patient not taking: Reported on 09/03/2015 07/14/15   Katharina Caper, MD  HYDROcodone-acetaminophen (NORCO/VICODIN) 5-325 MG tablet Take 1 tablet by mouth every 4 (four) hours as needed. 08/04/16   Minna Antis, MD  metroNIDAZOLE (FLAGYL) 500 MG tablet Take 1 tablet (500 mg total) by mouth 3 (three) times daily. 09/08/15   Katharina Caper, MD  naproxen (EC NAPROSYN) 500 MG EC tablet Take 1 tablet (500 mg total) by mouth 2 (two) times daily with a meal. Patient not taking: Reported on 07/11/2015 05/23/15   Jenise V Bacon Menshew, PA-C  nicotine (NICODERM CQ - DOSED IN MG/24 HOURS) 14 mg/24hr patch Place 1 patch (14 mg total) onto the skin daily. 09/08/15   Katharina Caper, MD  nicotine polacrilex (NICORETTE) 2 MG gum Take 1 each (2 mg total) by mouth as needed for smoking cessation. Patient not taking: Reported on 09/03/2015 07/14/15   Katharina Caper, MD  oxyCODONE-acetaminophen (PERCOCET) 7.5-325 MG tablet Take 1 tablet by mouth every 4 (four) hours as needed for severe pain. 11/22/15 11/21/16  Emily Filbert, MD  polyethylene glycol [facility] / Ethelene Hal) packet Take 17 g by mouth daily  as needed for mild constipation. 09/08/15   Katharina Caper, MD  sulfamethoxazole-trimethoprim (BACTRIM DS,SEPTRA DS) 800-160 MG tablet Take 1 tablet by mouth 2 (two) times daily. 08/04/16   Minna Antis, MD    Allergies Patient has no known allergies.  Family History  Problem Relation Age of Onset  . CVA Mother   . CVA Brother   . Diabetes Mellitus II Father     Social History Social History  Substance Use Topics   . Smoking status: Current Every Day Smoker    Packs/day: 0.50    Types: Cigarettes  . Smokeless tobacco: Never Used  . Alcohol use 0.0 oz/week     Comment: ocasionally    Review of Systems  Constitutional: Negative for fever. Cardiovascular: Negative for chest pain. Respiratory: Negative for shortness of breath. Gastrointestinal: Negative for abdominal pain, vomiting and diarrhea. Genitourinary: Negative for dysuria. Musculoskeletal: Positive for right 2nd toe swelling and pain. Skin: Negative for rash. Neurological: Negative for headaches, focal weakness or numbness.  10-point ROS otherwise negative.  ____________________________________________   PHYSICAL EXAM:  VITAL SIGNS: ED Triage Vitals  Enc Vitals Group     BP 09/19/16 0811 116/70     Pulse Rate 09/19/16 0811 92     Resp 09/19/16 0811 18     Temp 09/19/16 0811 98.7 F (37.1 C)     Temp Source 09/19/16 0811 Oral     SpO2 09/19/16 0811 97 %     Weight 09/19/16 0810 240 lb (108.9 kg)     Height 09/19/16 0810 6\' 4"  (1.93 m)     Head Circumference --      Peak Flow --      Pain Score 09/19/16 0810 9    Constitutional: Alert and oriented. Well appearing and in no distress. Eyes: Conjunctivae are normal. Normal extraocular movements. ENT   Head: Normocephalic and atraumatic.   Nose: No congestion/rhinnorhea.   Mouth/Throat: Mucous membranes are moist.   Neck: No stridor. Hematological/Lymphatic/Immunilogical: No cervical lymphadenopathy. Cardiovascular: Normal rate, regular rhythm.  No murmurs, rubs, or gallops.  Respiratory: Normal respiratory effort without tachypnea nor retractions. Breath sounds are clear and equal bilaterally. No wheezes/rales/rhonchi. Gastrointestinal: Soft and non tender. No rebound. No guarding.  Genitourinary: Deferred Musculoskeletal: Normal range of motion in all extremities. Swelling to right second toe. Tender to palpation to distal foot. Ulcer to plantar surface with  foul odor.  Neurologic:  Normal speech and language. No gross focal neurologic deficits are appreciated.  Skin:  Ulcer to plantar surface of right foot.  Psychiatric: Mood and affect are normal. Speech and behavior are normal. Patient exhibits appropriate insight and judgment.  ____________________________________________    LABS (pertinent positives/negatives)  Labs Reviewed  CBC WITH DIFFERENTIAL/PLATELET - Abnormal; Notable for the following:       Result Value   RBC 3.99 (*)    HCT 39.1 (*)    Neutro Abs 8.3 (*)    Lymphs Abs 0.9 (*)    All other components within normal limits  COMPREHENSIVE METABOLIC PANEL - Abnormal; Notable for the following:    Glucose, Bld 110 (*)    AST 14 (*)    ALT 10 (*)    All other components within normal limits  AEROBIC CULTURE (SUPERFICIAL SPECIMEN)  CULTURE, BLOOD (ROUTINE X 2)  CULTURE, BLOOD (ROUTINE X 2)  AEROBIC/ANAEROBIC CULTURE (SURGICAL/DEEP WOUND)     ____________________________________________   EKG  None  ____________________________________________    RADIOLOGY  Right foot x-ray IMPRESSION:  1. Changes of  active osteomyelitis involving the head of the right  second metatarsal and proximal phalanx of the right second  metatarsal where there is periosteal reaction as well.  2. Osteomyelitis involves the distal portion of the proximal phalanx  of the right fourth digit as well.   ____________________________________________   PROCEDURES  Procedures  ____________________________________________   INITIAL IMPRESSION / ASSESSMENT AND PLAN / ED COURSE  Pertinent labs & imaging results that were available during my care of the patient were reviewed by me and considered in my medical decision making (see chart for details).  Patient presented to the emergency department today because of concerns for right foot ulcer and swelling. EKG was concerning for osteomyelitis. Patient will be given IV antibiotics and  admitted to the hospital service.  ____________________________________________   FINAL CLINICAL IMPRESSION(S) / ED DIAGNOSES  Final diagnoses:  Osteomyelitis, unspecified site, unspecified type [facility](HCC)     Note: This dictation was prepared with Dragon dictation. Any transcriptional errors that result from this process are unintentional     Phineas SemenGraydon Yaa Donnellan, MD 09/19/16 1437

## 2016-09-19 NOTE — Consult Note (Signed)
ORTHOPAEDIC CONSULTATION  REQUESTING PHYSICIAN: Shaune PollackQing Chen, MD  Chief Complaint: Right foot infection  HPI: [name] [name] is a 56 y.o. male who complains of  right foot pain and swelling. He's noticed pain in this right foot for at least the last week now. Was seen actually in the ER about a month ago and started on antibiotics. A podiatrist since last year. He underwent an amputation of his great toe approximate year ago. Admitted from the ER today with pain and swelling to the right foot. X-rays were taken and were concerning for osteomyelitis.  Past Medical History:  Diagnosis Date  . Anemia of chronic disease   . Tobacco use disorder   . Toe gangrene [facility](HCC)    Past Surgical History:  Procedure Laterality Date  . AMPUTATION TOE Left 07/12/2015   Procedure: AMPUTATION TOE;  Surgeon: Gwyneth RevelsJustin Lilah Mijangos, DPM;  Location: [facility] ORS;  Service: Podiatry;  Laterality: Left;  . IRRIGATION AND DEBRIDEMENT FOOT Left 07/12/2015   Procedure: IRRIGATION AND DEBRIDEMENT FOOT;  Surgeon: Gwyneth RevelsJustin Krystie Leiter, DPM;  Location: [facility] ORS;  Service: Podiatry;  Laterality: Left;  . IRRIGATION AND DEBRIDEMENT FOOT Left 09/06/2015   Procedure: IRRIGATION AND DEBRIDEMENT FOOT;  Surgeon: Recardo EvangelistMatthew Troxler, DPM;  Location: [facility] ORS;  Service: Podiatry;  Laterality: Left;   Social History   Social History  . Marital status: Single    Spouse name: N/A  . Number of children: N/A  . Years of education: N/A   Social History Main Topics  . Smoking status: Current Every Day Smoker    Packs/day: 0.50    Types: Cigarettes  . Smokeless tobacco: Never Used  . Alcohol use 2.4 oz/week    4 Cans of beer per week     Comment: daily  . Drug use: Yes     Comment: cocaine occasionally  . Sexual activity: Not Asked   Other Topics Concern  . None   Social History Narrative   Lives at home with brother. Independent, working currently at [employer].   Family History  Problem Relation Age of Onset  . CVA Mother   . CVA  Brother   . Diabetes Mellitus II Father    No Known Allergies Prior to Admission medications   Medication Sig Start Date End Date Taking? Authorizing Provider  docusate sodium (COLACE) 100 MG capsule Take 1 capsule (100 mg total) by mouth 2 (two) times daily. Patient not taking: Reported on 09/03/2015 07/14/15   Katharina Caperima Vaickute, MD  HYDROcodone-acetaminophen (NORCO/VICODIN) 5-325 MG tablet Take 1 tablet by mouth every 4 (four) hours as needed. 08/04/16   Minna AntisKevin Paduchowski, MD  naproxen (EC NAPROSYN) 500 MG EC tablet Take 1 tablet (500 mg total) by mouth 2 (two) times daily with a meal. Patient not taking: Reported on 07/11/2015 05/23/15   Jenise V Bacon Menshew, PA-C  nicotine (NICODERM CQ - DOSED IN MG/24 HOURS) 14 mg/24hr patch Place 1 patch (14 mg total) onto the skin daily. 09/08/15   Katharina Caperima Vaickute, MD  nicotine polacrilex (NICORETTE) 2 MG gum Take 1 each (2 mg total) by mouth as needed for smoking cessation. Patient not taking: Reported on 09/03/2015 07/14/15   Katharina Caperima Vaickute, MD  oxyCODONE-acetaminophen (PERCOCET) 7.5-325 MG tablet Take 1 tablet by mouth every 4 (four) hours as needed for severe pain. 11/22/15 11/21/16  Emily FilbertJonathan E Williams, MD  polyethylene glycol [facility] / Ethelene HalGLYCOLAX) packet Take 17 g by mouth daily as needed for mild constipation. 09/08/15   Katharina Caperima Vaickute, MD   Dg Foot Complete  Right  Result Date: 09/19/2016 CLINICAL DATA:  Pain swelling in the right first and second toes EXAM: RIGHT FOOT COMPLETE - 3+ VIEW COMPARISON:  Right foot films of 08/04/2016 FINDINGS: There is chronic dislocation of the right second MTP joint. There is focal rim demineralization and erosion upper portion of the head and neck of the right second metatarsal consistent with active osteomyelitis with adjacent soft tissue swelling. There is also periosteal reaction along the proximal phalanx of the second digit consistent with osteomyelitis as well. In addition there is abnormality of the distal portion of the  proximal phalanx of the right fourth digit also worrisome osteomyelitis which may be involving the middle phalanx of the fourth digit as well. There is soft tissue swelling over the dorsum of the foot is well. A small plantar calcaneal degenerative spurs noted in there are degenerative changes in the midfoot. IMPRESSION: 1. Changes of active osteomyelitis involving the head of the right second metatarsal and proximal phalanx of the right second metatarsal where there is periosteal reaction as well. 2. Osteomyelitis involves the distal portion of the proximal phalanx of the right fourth digit as well. Electronically Signed   By: Dwyane Dee M.D.   On: 09/19/2016 09:25    Positive ROS: All other systems have been reviewed and were otherwise negative with the exception of those mentioned in the HPI and as above.  12 point ROS was performed.  Physical Exam: General: Alert and oriented.  No apparent distress.  Vascular:  Left foot:Dorsalis Pedis:  diminished Posterior Tibial:  diminished  Right foot: Dorsalis Pedis:  diminished Posterior Tibial:  diminished  Neuro:absent protective sensation  Derm: Left foot has a well-healed scar from the great toe amputation site.  Right foot has a noted ulceration plantar to the second MTPJ. Upon probing there was noted purulent drainage. I was able to perform a wound culture to this region. There is noted diffuse darkened discoloration to the right second toe as well. No other ulcerative sites are open draining areas. Specific attention to the right fourth toe did not reveal an open ulceration at this time.  Ortho/MS: Status post left great toe amputation.  Right foot with diffuse edema to the second toe and forefoot region. Mild edema to the right fourth toe but no open draining wound.   Assessment: Likely osteomyelitis second toe and metatarsal phalangeal joint. 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 History  of diabetes with neuropathy.  Plan: He has a open draining purulent ulceration of the plantar aspect of the second metatarsal head that probes to bone. A wound culture was performed today. I will order an MRI of this right foot to evaluate for the extent of infection. I suspect at least the second toe and second metatarsal have osteomyelitis and will need to undergo surgical debridement. Can evaluate more the forefoot after the MRI has been performed.   Irean Hong, DPM Cell [phone]   09/19/2016 12:44 PM

## 2016-09-19 NOTE — ED Notes (Signed)
Patient transported to X-ray 

## 2016-09-19 NOTE — H&P (Addendum)
[employer] -  at [facility]   PATIENT NAME: [name] [name]    MR#:  [id number]  DATE OF BIRTH:  [birth year]  DATE OF ADMISSION:  09/19/2016  PRIMARY CARE PHYSICIAN: No PCP Per Patient   REQUESTING/REFERRING PHYSICIAN: Phineas Semen, MD  CHIEF COMPLAINT:   Chief Complaint  Patient presents with  . Toe Pain   Right foot toe pain worsening for 5 days. HISTORY OF PRESENT ILLNESS:  [name] [name]  is a 56 y.o. male with a known history of Left great toe amputation, foot infection, anemia and smoking. Patient has a right second toe pain and swelling for about 1 months, which has been worsening progressively. He was seen in ED 1 months ago for foot ulcer at the bottom of his right foot. He was given antibiotics but he didn't take antibiotics. He denies any fever or chills, denies any other symptoms. X-ray show right second and the fourth toe osteomyelitis.  PAST MEDICAL HISTORY:   Past Medical History:  Diagnosis Date  . Anemia of chronic disease   . Tobacco use disorder   . Toe gangrene (HCC)     PAST SURGICAL HISTORY:   Past Surgical History:  Procedure Laterality Date  . AMPUTATION TOE Left 07/12/2015   Procedure: AMPUTATION TOE;  Surgeon: Gwyneth Revels, DPM;  Location: [facility] ORS;  Service: Podiatry;  Laterality: Left;  . IRRIGATION AND DEBRIDEMENT FOOT Left 07/12/2015   Procedure: IRRIGATION AND DEBRIDEMENT FOOT;  Surgeon: Gwyneth Revels, DPM;  Location: [facility] ORS;  Service: Podiatry;  Laterality: Left;  . IRRIGATION AND DEBRIDEMENT FOOT Left 09/06/2015   Procedure: IRRIGATION AND DEBRIDEMENT FOOT;  Surgeon: Recardo Evangelist, DPM;  Location: [facility] ORS;  Service: Podiatry;  Laterality: Left;    SOCIAL HISTORY:   Social History  Substance Use Topics  . Smoking status: Current Every Day Smoker    Packs/day: 0.50    Types: Cigarettes  . Smokeless tobacco: Never Used  . Alcohol use 2.4 oz/week    4 Cans of beer per week     Comment: daily    FAMILY  HISTORY:   Family History  Problem Relation Age of Onset  . CVA Mother   . CVA Brother   . Diabetes Mellitus II Father     DRUG ALLERGIES:  No Known Allergies  REVIEW OF SYSTEMS:   Review of Systems  Constitutional: Negative for chills, fever and malaise/fatigue.  HENT: Negative for congestion.   Eyes: Negative for blurred vision and double vision.  Respiratory: Negative for cough, shortness of breath and stridor.   Cardiovascular: Negative for chest pain, palpitations and leg swelling.  Gastrointestinal: Negative for abdominal pain, blood in stool, constipation, diarrhea, melena, nausea and vomiting.  Genitourinary: Negative for dysuria, frequency and hematuria.  Musculoskeletal: Positive for joint pain. Negative for back pain.  Neurological: Negative for dizziness, tremors, focal weakness, loss of consciousness and weakness.  Psychiatric/Behavioral: Negative for depression. The patient is not nervous/anxious.     MEDICATIONS AT HOME:   Prior to Admission medications   Medication Sig Start Date End Date Taking? Authorizing Provider  ciprofloxacin (CIPRO) 500 MG tablet Take 1 tablet (500 mg total) by mouth 2 (two) times daily. 09/08/15   Katharina Caper, MD  docusate sodium (COLACE) 100 MG capsule Take 1 capsule (100 mg total) by mouth 2 (two) times daily. Patient not taking: Reported on 09/03/2015 07/14/15   Katharina Caper, MD  HYDROcodone-acetaminophen (NORCO/VICODIN) 5-325 MG tablet Take 1 tablet by mouth every 4 (four) hours as  needed. 08/04/16   Minna Antis, MD  metroNIDAZOLE (FLAGYL) 500 MG tablet Take 1 tablet (500 mg total) by mouth 3 (three) times daily. 09/08/15   Katharina Caper, MD  naproxen (EC NAPROSYN) 500 MG EC tablet Take 1 tablet (500 mg total) by mouth 2 (two) times daily with a meal. Patient not taking: Reported on 07/11/2015 05/23/15   Jenise V Bacon Menshew, PA-C  nicotine (NICODERM CQ - DOSED IN MG/24 HOURS) 14 mg/24hr patch Place 1 patch (14 mg total) onto the  skin daily. 09/08/15   Katharina Caper, MD  nicotine polacrilex (NICORETTE) 2 MG gum Take 1 each (2 mg total) by mouth as needed for smoking cessation. Patient not taking: Reported on 09/03/2015 07/14/15   Katharina Caper, MD  oxyCODONE-acetaminophen (PERCOCET) 7.5-325 MG tablet Take 1 tablet by mouth every 4 (four) hours as needed for severe pain. 11/22/15 11/21/16  Emily Filbert, MD  polyethylene glycol [facility] / Ethelene Hal) packet Take 17 g by mouth daily as needed for mild constipation. 09/08/15   Katharina Caper, MD  sulfamethoxazole-trimethoprim (BACTRIM DS,SEPTRA DS) 800-160 MG tablet Take 1 tablet by mouth 2 (two) times daily. 08/04/16   Minna Antis, MD      VITAL SIGNS:  Blood pressure 124/81, pulse 85, temperature 98.7 F (37.1 C), temperature source Oral, resp. rate 18, height 6\' 4"  (1.93 m), weight 240 lb (108.9 kg), SpO2 98 %.  PHYSICAL EXAMINATION:  Physical Exam  GENERAL:  56 y.o.-year-old patient lying in the bed with no acute distress.  EYES: Pupils equal, round, reactive to light and accommodation. No scleral icterus. Extraocular muscles intact.  HEENT: Head atraumatic, normocephalic. Oropharynx and nasopharynx clear.  NECK:  Supple, no jugular venous distention. No thyroid enlargement, no tenderness.  LUNGS: Normal breath sounds bilaterally, no wheezing, rales,rhonchi or crepitation. No use of accessory muscles of respiration.  CARDIOVASCULAR: S1, S2 normal. No murmurs, rubs, or gallops.  ABDOMEN: Soft, nontender, nondistended. Bowel sounds present. No organomegaly or mass.  EXTREMITIES: No pedal edema, cyanosis, or clubbing. Swelling and tenderness of right second and fourth toes and at discharge from bottom of the foot.  NEUROLOGIC: Cranial nerves II through XII are intact. Muscle strength 5/5 in all extremities. Sensation intact. Gait not checked.  PSYCHIATRIC: The patient is alert and oriented x 3.  SKIN: No obvious rash, lesion, or ulcer.   LABORATORY PANEL:    CBC  Recent Labs Lab 09/19/16 0814  WBC 10.3  HGB 13.4  HCT 39.1*  PLT 319   ------------------------------------------------------------------------------------------------------------------  Chemistries   Recent Labs Lab 09/19/16 0814  NA 137  K 3.8  CL 102  CO2 26  GLUCOSE 110*  BUN 6  CREATININE 0.71  CALCIUM 9.2  AST 14*  ALT 10*  ALKPHOS 70  BILITOT 1.0   ------------------------------------------------------------------------------------------------------------------  Cardiac Enzymes No results for input(s): TROPONINI in the last 168 hours. ------------------------------------------------------------------------------------------------------------------  RADIOLOGY:  Dg Foot Complete Right  Result Date: 09/19/2016 CLINICAL DATA:  Pain swelling in the right first and second toes EXAM: RIGHT FOOT COMPLETE - 3+ VIEW COMPARISON:  Right foot films of 08/04/2016 FINDINGS: There is chronic dislocation of the right second MTP joint. There is focal rim demineralization and erosion upper portion of the head and neck of the right second metatarsal consistent with active osteomyelitis with adjacent soft tissue swelling. There is also periosteal reaction along the proximal phalanx of the second digit consistent with osteomyelitis as well. In addition there is abnormality of the distal portion of the proximal phalanx of  the right fourth digit also worrisome osteomyelitis which may be involving the middle phalanx of the fourth digit as well. There is soft tissue swelling over the dorsum of the foot is well. A small plantar calcaneal degenerative spurs noted in there are degenerative changes in the midfoot. IMPRESSION: 1. Changes of active osteomyelitis involving the head of the right second metatarsal and proximal phalanx of the right second metatarsal where there is periosteal reaction as well. 2. Osteomyelitis involves the distal portion of the proximal phalanx of the right fourth  digit as well. Electronically Signed   By: Dwyane DeePaul  Barry M.D.   On: 09/19/2016 09:25      IMPRESSION AND PLAN:   Left foot second and fourth toe osteomyelitis The patient will be admitted to medical floor. Since start vancomycin and Zosyn, follow-up on culture and blood culture, follow-up podiatry consult.  Anemia of chronic disease. Inguinal is stable. Alcohol abuse. Start CIWA protocol. Tobacco abuse. Smoking cessation was counseled for 3 minutes, nicotine patch.    All the records are reviewed and case discussed with ED provider. Management plans discussed with the patient, family and they are in agreement.  CODE STATUS: Full code  TOTAL TIME TAKING CARE OF THIS PATIENT: 48 minutes.    [name] [name], Rita Vialpando M.D on 09/19/2016 at 10:06 AM  Between 7am to 6pm - Pager - [phone]  After 6pm go to [email or web address] - Scientist, research (life [email or web address])password [email or web address]  [facility] [place] [facility]  Office  [phone]  CC: Primary care physician; No PCP Per Patient   Note: This dictation was prepared with Dragon dictation along with smaller phrase technology. Any transcriptional errors that result from this process are unintentional.

## 2016-09-19 NOTE — ED Triage Notes (Signed)
Pt presents to ED with c/o swelling and pain to R toe. Pt states was seen here approx 1 month ago for an ulcer to the bottom of his foot, given abx and sent home, pt states now his R great toe and 2nd toe are swollen and c/o "rottent flesh smell". Pt is noted to have foul smelling odor coming from his R foot.

## 2016-09-20 ENCOUNTER — Inpatient Hospital Stay: Payer: Self-pay

## 2016-09-20 LAB — CBC
HCT: 34.7 % — ABNORMAL LOW (ref 40.0–52.0)
HEMOGLOBIN: 11.8 g/dL — AB (ref 13.0–18.0)
MCH: 33.8 pg (ref 26.0–34.0)
MCHC: 34 g/dL (ref 32.0–36.0)
MCV: 99.4 fL (ref 80.0–100.0)
Platelets: 290 10*3/uL (ref 150–440)
RBC: 3.49 MIL/uL — ABNORMAL LOW (ref 4.40–5.90)
RDW: 13.5 % (ref 11.5–14.5)
WBC: 5.5 10*3/uL (ref 3.8–10.6)

## 2016-09-20 LAB — BASIC METABOLIC PANEL
ANION GAP: 7 (ref 5–15)
BUN: 8 mg/dL (ref 6–20)
CHLORIDE: 102 mmol/L (ref 101–111)
CO2: 27 mmol/L (ref 22–32)
CREATININE: 0.74 mg/dL (ref 0.61–1.24)
Calcium: 8.7 mg/dL — ABNORMAL LOW (ref 8.9–10.3)
GFR calc non Af Amer: >60 mL/min (ref >60)
GLUCOSE: 104 mg/dL — AB (ref 65–99)
Potassium: 3.5 mmol/L (ref 3.5–5.1)
Sodium: 136 mmol/L (ref 135–145)

## 2016-09-20 LAB — VANCOMYCIN, TROUGH: Vancomycin Tr: 13 ug/mL — ABNORMAL LOW (ref 15–20)

## 2016-09-20 NOTE — Progress Notes (Signed)
[facility] - [place] at [facility]   PATIENT NAME: [name] [name]    MR#:  [id number]  DATE OF BIRTH:  [birth year]  SUBJECTIVE:    Patient here with open purulent drainage from bottom of right foot.  REVIEW OF SYSTEMS:    Review of Systems  Constitutional: Negative.  Negative for chills, fever and malaise/fatigue.  HENT: Negative.  Negative for ear discharge, ear pain, hearing loss, nosebleeds and sore throat.   Eyes: Negative.  Negative for blurred vision and pain.  Respiratory: Negative.  Negative for cough, hemoptysis, shortness of breath and wheezing.   Cardiovascular: Negative.  Negative for chest pain, palpitations and leg swelling.  Gastrointestinal: Negative.  Negative for abdominal pain, blood in stool, diarrhea, nausea and vomiting.  Genitourinary: Negative.  Negative for dysuria.  Musculoskeletal: Negative.  Negative for back pain.  Skin: Negative.        Covered right foot  Neurological: Negative for dizziness, tremors, speech change, focal weakness, seizures and headaches.  Endo/Heme/Allergies: Negative.  Does not bruise/bleed easily.  Psychiatric/Behavioral: Negative.  Negative for depression, hallucinations and suicidal ideas.    Tolerating Diet: yes      DRUG ALLERGIES:  No Known Allergies  VITALS:  Blood pressure 130/77, pulse 67, temperature 97.6 F (36.4 C), temperature source Oral, resp. rate 20, height 6\' 4"  (1.93 m), weight 108.9 kg (240 lb), SpO2 100 %.  PHYSICAL EXAMINATION:   Physical Exam  Constitutional: He is oriented to person, place, and time and well-developed, well-nourished, and in no distress. No distress.  HENT:  Head: Normocephalic.  Eyes: No scleral icterus.  Neck: Normal range of motion. Neck supple. No JVD present. No tracheal deviation present.  Cardiovascular: Normal rate, regular rhythm and normal heart sounds.  Exam reveals no gallop and no friction rub.   No murmur heard. Pulmonary/Chest: Effort normal and  breath sounds normal. No respiratory distress. He has no wheezes. He has no rales. He exhibits no tenderness.  Abdominal: Soft. Bowel sounds are normal. He exhibits no distension and no mass. There is no tenderness. There is no rebound and no guarding.  Musculoskeletal: Normal range of motion. He exhibits no edema.  Neurological: He is alert and oriented to person, place, and time.  Skin: Skin is warm. No rash noted. No erythema.  Right foot covered Left big toe amputated  Psychiatric: Affect and judgment normal.      LABORATORY PANEL:   CBC  Recent Labs Lab 09/20/16 0518  WBC 5.5  HGB 11.8*  HCT 34.7*  PLT 290   ------------------------------------------------------------------------------------------------------------------  Chemistries   Recent Labs Lab 09/19/16 0814 09/20/16 0518  NA 137 136  K 3.8 3.5  CL 102 102  CO2 26 27  GLUCOSE 110* 104*  BUN 6 8  CREATININE 0.71 0.74  CALCIUM 9.2 8.7*  AST 14*  --   ALT 10*  --   ALKPHOS 70  --   BILITOT 1.0  --    ------------------------------------------------------------------------------------------------------------------  Cardiac Enzymes No results for input(s): TROPONINI in the last 168 hours. ------------------------------------------------------------------------------------------------------------------  RADIOLOGY:  Mr Foot Right Wo Contrast  Result Date: 09/20/2016 CLINICAL DATA:  Plantar skin ulceration right foot EXAM: MRI OF THE RIGHT FOREFOOT WITHOUT CONTRAST TECHNIQUE: Multiplanar, multisequence MR imaging of the right foot was performed. No intravenous contrast was administered. COMPARISON:  Plain films right foot 09/19/2016. FINDINGS: Bones/Joint/Cartilage Marrow edema is seen throughout the third toe. The third toe is dorsally dislocated at the MTP joint. Marrow edema is also identified throughout  the second metatarsal, most intense in the metatarsal head and neck. Bony destructive change in the  head of the proximal phalanx of the fourth toe is seen as on prior plain films. Marrow edema is also seen in the medial and middle cuneiform and central aspect of the navicular. Bone marrow signal is otherwise unremarkable. Ligaments Negative. Muscles and Tendons Intrinsic musculature the foot is markedly atrophied. Soft tissues Plantar wound is seen deep to the distal first metatarsal. There is a heterogeneous fluid collection deep to the wound measuring 3.6 cm long by 4.2 cm craniocaudal by 2.6 cm transverse most consistent with abscess. Subcutaneous edema is present about the foot. IMPRESSION: Plantar soft tissue wound with underlying abscess about the second metatarsal head. Multifocal marrow edema most notable throughout the third toe and second metatarsal worrisome for osteomyelitis. Milder degree of marrow edema in the navicular and medial and middle cuneiforms could also be due to osteomyelitis. Destructive change in the proximal phalanx of the fourth toe without marrow edema consistent with chronic osteomyelitis. Electronically Signed   By: Drusilla Kanner M.D.   On: 09/20/2016 11:18   Dg Foot Complete Right  Result Date: 09/19/2016 CLINICAL DATA:  Pain swelling in the right first and second toes EXAM: RIGHT FOOT COMPLETE - 3+ VIEW COMPARISON:  Right foot films of 08/04/2016 FINDINGS: There is chronic dislocation of the right second MTP joint. There is focal rim demineralization and erosion upper portion of the head and neck of the right second metatarsal consistent with active osteomyelitis with adjacent soft tissue swelling. There is also periosteal reaction along the proximal phalanx of the second digit consistent with osteomyelitis as well. In addition there is abnormality of the distal portion of the proximal phalanx of the right fourth digit also worrisome osteomyelitis which may be involving the middle phalanx of the fourth digit as well. There is soft tissue swelling over the dorsum of the foot  is well. A small plantar calcaneal degenerative spurs noted in there are degenerative changes in the midfoot. IMPRESSION: 1. Changes of active osteomyelitis involving the head of the right second metatarsal and proximal phalanx of the right second metatarsal where there is periosteal reaction as well. 2. Osteomyelitis involves the distal portion of the proximal phalanx of the right fourth digit as well. Electronically Signed   By: Dwyane Dee M.D.   On: 09/19/2016 09:25     ASSESSMENT AND PLAN:   56 year old male with history of tobacco dependence who presents with a right foot pain.   1. Right second toe osteomyelitis involving had [place] right second metatarsal and proximal phalanx: Patient will need to undergo surgical debridement and likely  amputation. Appreciate podiatry consult Continue vancomycin and Zosyn  2. EtOH abuse: Continue CIWA protocol  3. Anemia of chronic disease: Follow hemoglobin.  4. Tobacco dependence: Continue nicotine patch    Management plans discussed with the patient and he is in agreement.  CODE STATUS: full  TOTAL TIME TAKING CARE OF THIS PATIENT: 30 minutes.     POSSIBLE D/C 2 days, DEPENDING ON CLINICAL CONDITION.   Rhett Mutschler M.D on 09/20/2016 at 12:48 PM  Between 7am to 6pm - Pager - [phone] After 6pm go to [email or web address] - Social research officer, government  [facility] [place] [facility]  Office  [phone]  CC: Primary care physician; No PCP Per Patient  Note: This dictation was prepared with Dragon dictation along with smaller phrase technology. Any transcriptional errors that result from this process are unintentional.

## 2016-09-20 NOTE — Progress Notes (Signed)
Daily Progress Note   Subjective  - * No surgery found *  F/u right foot infection.  MRI today  Objective Vitals:   09/20/16 0006 09/20/16 0456 09/20/16 1147 09/20/16 1452  BP: 94/62 110/73 130/77 121/75  Pulse: 77 76 67 74  Resp: 20 19 20 16   Temp: 98.3 F (36.8 C) 98.1 F (36.7 C) 97.6 F (36.4 C) 97.2 F (36.2 C)  TempSrc: Oral Oral Oral   SpO2: 100% 99% 100% 99%  Weight:      Height:        Physical Exam: Pt with draining ulcer plantar right 2nd mtpj with diffuse edema.  Also with noted equinus right foot.  Laboratory CBC    Component Value Date/Time   WBC 5.5 09/20/2016 0518   HGB 11.8 (L) 09/20/2016 0518   HGB 12.1 (L) 07/14/2012 1726   HCT 34.7 (L) 09/20/2016 0518   HCT 35.9 (L) 07/14/2012 1726   PLT 290 09/20/2016 0518   PLT 310 07/14/2012 1726    BMET    Component Value Date/Time   NA 136 09/20/2016 0518   NA 140 07/14/2012 1726   K 3.5 09/20/2016 0518   K 3.8 07/14/2012 1726   CL 102 09/20/2016 0518   CL 106 07/14/2012 1726   CO2 27 09/20/2016 0518   CO2 26 07/14/2012 1726   GLUCOSE 104 (H) 09/20/2016 0518   GLUCOSE 82 07/14/2012 1726   BUN 8 09/20/2016 0518   BUN 9 07/14/2012 1726   CREATININE 0.74 09/20/2016 0518   CREATININE 0.80 07/14/2012 1726   CALCIUM 8.7 (L) 09/20/2016 0518   CALCIUM 9.3 07/14/2012 1726   GFRNONAA >60 09/20/2016 0518   GFRNONAA >60 07/14/2012 1726   GFRAA >60 09/20/2016 0518   GFRAA >60 07/14/2012 1726   MRI:  Consistent with osteomyeltiis right 2nd toe and metatarsal.   Assessment/Planning: Osteomyelitis right 2nd toe and metatarsal. Equinus right leg. DM with neuropathy.   Will plan for debridement and 2nd toe and metatarsal excision.  Will also perform tendo-achilles lengthening.  Plan for Friday surgery  D/W pt and understands plan  Will f/u tomorrow for final decision.   Gwyneth RevelsFowler, Jacquelyn Antony A  09/20/2016, 4:56 PM

## 2016-09-20 NOTE — Progress Notes (Signed)
While rounding the unit the Nurse asked CH to visit with Pt. Pt was resting, but appeared to anxious. Pt told Ch that his toe might be amputated, he was anxiously waiting for the Dc. CH encouraged Pt and provided prayers for healing. CH told Pt that he is available if needed.    09/20/16 1500  Clinical Encounter Type  Visited With Patient  Visit Type Initial;Spiritual support  Referral From Nurse  Consult/Referral To Chaplain  Spiritual Encounters  Spiritual Needs Prayer;Emotional

## 2016-09-21 LAB — CBC
HEMATOCRIT: 35.6 % — AB (ref 40.0–52.0)
HEMOGLOBIN: 12.5 g/dL — AB (ref 13.0–18.0)
MCH: 35 pg — AB (ref 26.0–34.0)
MCHC: 35.2 g/dL (ref 32.0–36.0)
MCV: 99.4 fL (ref 80.0–100.0)
Platelets: 303 10*3/uL (ref 150–440)
RBC: 3.58 MIL/uL — ABNORMAL LOW (ref 4.40–5.90)
RDW: 13.2 % (ref 11.5–14.5)
WBC: 5 10*3/uL (ref 3.8–10.6)

## 2016-09-21 LAB — BASIC METABOLIC PANEL
Anion gap: 7 (ref 5–15)
BUN: 9 mg/dL (ref 6–20)
CHLORIDE: 105 mmol/L (ref 101–111)
CO2: 26 mmol/L (ref 22–32)
CREATININE: 0.77 mg/dL (ref 0.61–1.24)
Calcium: 8.7 mg/dL — ABNORMAL LOW (ref 8.9–10.3)
GFR calc Af Amer: >60 mL/min (ref >60)
GFR calc non Af Amer: >60 mL/min (ref >60)
GLUCOSE: 109 mg/dL — AB (ref 65–99)
POTASSIUM: 3.7 mmol/L (ref 3.5–5.1)
Sodium: 138 mmol/L (ref 135–145)

## 2016-09-21 LAB — SURGICAL PCR SCREEN
MRSA, PCR: NEGATIVE
Staphylococcus aureus: NEGATIVE

## 2016-09-21 MED ORDER — VANCOMYCIN HCL 10 G IV SOLR
1250.0000 mg | Freq: Three times a day (TID) | INTRAVENOUS | Status: DC
  Administered 2016-09-21 – 2016-09-22 (×4): 1250 mg via INTRAVENOUS
  Filled 2016-09-21 (×6): qty 1250

## 2016-09-21 MED ORDER — CHLORHEXIDINE GLUCONATE 4 % EX LIQD: 60.0000 mL | Freq: Once | CUTANEOUS | Status: DC

## 2016-09-21 NOTE — Progress Notes (Signed)
[facility] - [place] at [facility]   PATIENT NAME: [name] [name]    MR#:  [id number]  DATE OF BIRTH:  1960/10/22  SUBJECTIVE:    Patient going to OR tomorrow No issues overnight  REVIEW OF SYSTEMS:    Review of Systems  Constitutional: Negative.  Negative for chills, fever and malaise/fatigue.  HENT: Negative.  Negative for ear discharge, ear pain, hearing loss, nosebleeds and sore throat.   Eyes: Negative.  Negative for blurred vision and pain.  Respiratory: Negative.  Negative for cough, hemoptysis, shortness of breath and wheezing.   Cardiovascular: Negative.  Negative for chest pain, palpitations and leg swelling.  Gastrointestinal: Negative.  Negative for abdominal pain, blood in stool, diarrhea, nausea and vomiting.  Genitourinary: Negative.  Negative for dysuria.  Musculoskeletal: Negative.  Negative for back pain.  Skin: Negative.        Covered right foot  Neurological: Negative for dizziness, tremors, speech change, focal weakness, seizures and headaches.  Endo/Heme/Allergies: Negative.  Does not bruise/bleed easily.  Psychiatric/Behavioral: Negative.  Negative for depression, hallucinations and suicidal ideas.    Tolerating Diet: yes      DRUG ALLERGIES:  No Known Allergies  VITALS:  Blood pressure 115/75, pulse 75, temperature 98 F (36.7 C), temperature source Oral, resp. rate 19, height 6\' 4"  (1.93 m), weight 108.9 kg (240 lb), SpO2 98 %.  PHYSICAL EXAMINATION:   Physical Exam  Constitutional: He is oriented to person, place, and time and well-developed, well-nourished, and in no distress. No distress.  HENT:  Head: Normocephalic.  Eyes: No scleral icterus.  Neck: Normal range of motion. Neck supple. No JVD present. No tracheal deviation present.  Cardiovascular: Normal rate, regular rhythm and normal heart sounds.  Exam reveals no gallop and no friction rub.   No murmur heard. Pulmonary/Chest: Effort normal and breath sounds normal.  No respiratory distress. He has no wheezes. He has no rales. He exhibits no tenderness.  Abdominal: Soft. Bowel sounds are normal. He exhibits no distension and no mass. There is no tenderness. There is no rebound and no guarding.  Musculoskeletal: Normal range of motion. He exhibits no edema.  Neurological: He is alert and oriented to person, place, and time.  Skin: Skin is warm. No rash noted. No erythema.  Right foot covered Left big toe amputated  Psychiatric: Affect and judgment normal.      LABORATORY PANEL:   CBC  Recent Labs Lab 09/21/16 0558  WBC 5.0  HGB 12.5*  HCT 35.6*  PLT 303   ------------------------------------------------------------------------------------------------------------------  Chemistries   Recent Labs Lab 09/19/16 0814  09/21/16 0558  NA 137  < > 138  K 3.8  < > 3.7  CL 102  < > 105  CO2 26  < > 26  GLUCOSE 110*  < > 109*  BUN 6  < > 9  CREATININE 0.71  < > 0.77  CALCIUM 9.2  < > 8.7*  AST 14*  --   --   ALT 10*  --   --   ALKPHOS 70  --   --   BILITOT 1.0  --   --   < > = values in this interval not displayed. ------------------------------------------------------------------------------------------------------------------  Cardiac Enzymes No results for input(s): TROPONINI in the last 168 hours. ------------------------------------------------------------------------------------------------------------------  RADIOLOGY:  Mr Foot Right Wo Contrast  Addendum Date: 09/20/2016   ADDENDUM REPORT: 09/20/2016 16:23 ADDENDUM: The third toe is normal in appearance. Edematous change is present throughout the second toe which  is dorsally and laterally dislocated. Edema is also seen throughout the second metatarsal. Findings are consistent osteomyelitis. Abscess off the plantar surface of the head of the second metatarsal is noted. Please disregard the second conclusion in the initially dictated report. Electronically Signed   By: Drusilla Kanner M.D.   On: 09/20/2016 16:23   Result Date: 09/20/2016 CLINICAL DATA:  Plantar skin ulceration right foot EXAM: MRI OF THE RIGHT FOREFOOT WITHOUT CONTRAST TECHNIQUE: Multiplanar, multisequence MR imaging of the right foot was performed. No intravenous contrast was administered. COMPARISON:  Plain films right foot 09/19/2016. FINDINGS: Bones/Joint/Cartilage Marrow edema is seen throughout the third toe. The third toe is dorsally dislocated at the MTP joint. Marrow edema is also identified throughout the second metatarsal, most intense in the metatarsal head and neck. Bony destructive change in the head of the proximal phalanx of the fourth toe is seen as on prior plain films. Marrow edema is also seen in the medial and middle cuneiform and central aspect of the navicular. Bone marrow signal is otherwise unremarkable. Ligaments Negative. Muscles and Tendons Intrinsic musculature the foot is markedly atrophied. Soft tissues Plantar wound is seen deep to the distal first metatarsal. There is a heterogeneous fluid collection deep to the wound measuring 3.6 cm long by 4.2 cm craniocaudal by 2.6 cm transverse most consistent with abscess. Subcutaneous edema is present about the foot. IMPRESSION: Plantar soft tissue wound with underlying abscess about the second metatarsal head. Multifocal marrow edema most notable throughout the third toe and second metatarsal worrisome for osteomyelitis. Milder degree of marrow edema in the navicular and medial and middle cuneiforms could also be due to osteomyelitis. Destructive change in the proximal phalanx of the fourth toe without marrow edema consistent with chronic osteomyelitis. Electronically Signed: By: Drusilla Kanner M.D. On: 09/20/2016 11:18     ASSESSMENT AND PLAN:   56 year old male with history of tobacco dependence who presents with a right foot pain.   1. Right second toe osteomyelitis involving had [place] right second metatarsal and proximal phalanx:   He will go for debridement and 2nd toe and metatarsal excision, Continue vancomycin and Zosyn PT consult after surgery  2. EtOH abuse: Continue CIWA protocol  3. Anemia of chronic disease: Hemoglobin stable  4. Tobacco dependence: Continue nicotine patch    Management plans discussed with the patient and he is in agreement.  CODE STATUS: full  TOTAL TIME TAKING CARE OF THIS PATIENT: 22 minutes.     POSSIBLE D/C 2 days, DEPENDING ON CLINICAL CONDITION.   Isom Kochan M.D on 09/21/2016 at 10:44 AM  Between 7am to 6pm - Pager - [phone] After 6pm go to [email or web address] - Social research officer, government  [facility] [place] [facility]  Office  [phone]  CC: Primary care physician; No PCP Per Patient  Note: This dictation was prepared with Dragon dictation along with smaller phrase technology. Any transcriptional errors that result from this process are unintentional.

## 2016-09-21 NOTE — Progress Notes (Signed)
Daily Progress Note   Subjective  - * No surgery date entered *  F/u right foot infection.  No complaints  Objective Vitals:   09/20/16 2011 09/21/16 0016 09/21/16 0554 09/21/16 0700  BP: 119/66 112/73 111/77 115/75  Pulse: 80 75 73 75  Resp: 16  18 19   Temp: 97.8 F (36.6 C) 98.3 F (36.8 C) 98.3 F (36.8 C) 98 F (36.7 C)  TempSrc: Oral  Oral Oral  SpO2: 99% 98% 99% 98%  Weight:      Height:        Physical Exam: Wound still with drainage. MRI consistent with osteomyelitis right 2nd toe and metatarsal.  Laboratory CBC    Component Value Date/Time   WBC 5.0 09/21/2016 0558   HGB 12.5 (L) 09/21/2016 0558   HGB 12.1 (L) 07/14/2012 1726   HCT 35.6 (L) 09/21/2016 0558   HCT 35.9 (L) 07/14/2012 1726   PLT 303 09/21/2016 0558   PLT 310 07/14/2012 1726    BMET    Component Value Date/Time   NA 138 09/21/2016 0558   NA 140 07/14/2012 1726   K 3.7 09/21/2016 0558   K 3.8 07/14/2012 1726   CL 105 09/21/2016 0558   CL 106 07/14/2012 1726   CO2 26 09/21/2016 0558   CO2 26 07/14/2012 1726   GLUCOSE 109 (H) 09/21/2016 0558   GLUCOSE 82 07/14/2012 1726   BUN 9 09/21/2016 0558   BUN 9 07/14/2012 1726   CREATININE 0.77 09/21/2016 0558   CREATININE 0.80 07/14/2012 1726   CALCIUM 8.7 (L) 09/21/2016 0558   CALCIUM 9.3 07/14/2012 1726   GFRNONAA >60 09/21/2016 0558   GFRNONAA >60 07/14/2012 1726   GFRAA >60 09/21/2016 0558   GFRAA >60 07/14/2012 1726   Wound culture: MODERATE GROUP B STREP(S.AGALACTIAE)ISOLATED  TESTING AGAINST S. AGALACTIAE NOT ROUTINELY PERFORMED DUE TO PREDICTABILITY OF AMP/PEN/VAN SUSCEPTIBILITY.  Performed at [facility], [address] [place], [place] [address]   Assessment/Planning: Osteomyelitis right 2nd toe and equinus    D/w pt r/b/a/c and pt given consent.  Plan for tomorrow.  NPO  Will need long term IV abx.  Recommend ID consult.  Gwyneth RevelsFowler, Karissa Meenan A  09/21/2016, 12:16 PM

## 2016-09-21 NOTE — Progress Notes (Signed)
Pharmacy Antibiotic Note  [name] [name] is a 56 y.o. male admitted on 09/19/2016 with osteomyelitis.  Pharmacy has been consulted for vancomycin and Zosyn dosing.  Plan: Vancomycin 1000 mg IV every 8 hours.  Goal trough 15-20 mcg/mL. Zosyn 3.375g IV q8h (4 hour infusion).   Ke 0.107, half life 6.48, Vd 76.2 L Trough before 5th dose of regimen  Height: 6\' 4"  (193 cm) Weight: 240 lb (108.9 kg) IBW/kg (Calculated) : 86.8  Temp (24hrs), Avg:97.7 F (36.5 C), Min:97.2 F (36.2 C), Max:98.1 F (36.7 C)   Recent Labs Lab 09/19/16 0814 09/20/16 0518 09/20/16 2258  WBC 10.3 5.5  --   CREATININE 0.71 0.74  --   VANCOTROUGH  --   --  13*    Estimated Creatinine Clearance (by C-G formula based on SCr of 0.74 mg/dL) Male: 161.0112.4 mL/min Male: 136.1 mL/min    No Known Allergies  Antimicrobials this admission: cipro x1 1/23 Vanc/zosyn 1/23 >>  Dose adjustments this admission: 1/24 23:00 vanc level 13. Changed to 1250 mg q 8 hours. Level before 4th new dose.   Microbiology results: 1/23 BCx x2 sent 1/23 WCx sent    Thank you for allowing pharmacy to be a part of this patient's care.  Luciano Cinquemani S 09/21/2016 12:09 AM

## 2016-09-22 ENCOUNTER — Encounter: Payer: Self-pay | Admitting: *Deleted

## 2016-09-22 ENCOUNTER — Inpatient Hospital Stay: Payer: Self-pay | Admitting: Anesthesiology

## 2016-09-22 ENCOUNTER — Encounter
Admission: EM | Disposition: A | Discharge status: Home / Self Care | Payer: Self-pay | Source: Home / Self Care | Attending: Internal Medicine

## 2016-09-22 HISTORY — PX: AMPUTATION TOE: SHX6595

## 2016-09-22 HISTORY — PX: ACHILLES TENDON SURGERY: SHX542

## 2016-09-22 LAB — VANCOMYCIN, TROUGH: VANCOMYCIN TR: 17 ug/mL (ref 15–20)

## 2016-09-22 LAB — GLUCOSE, CAPILLARY: Glucose-Capillary: 121 mg/dL — ABNORMAL HIGH (ref 65–99)

## 2016-09-22 SURGERY — AMPUTATION, TOE
Anesthesia: General | Laterality: Right | Wound class: Clean

## 2016-09-22 MED ORDER — FENTANYL CITRATE (PF) 100 MCG/2ML IJ SOLN
INTRAMUSCULAR | Status: AC
  Filled 2016-09-22: qty 2

## 2016-09-22 MED ORDER — MIDAZOLAM HCL 2 MG/2ML IJ SOLN
INTRAMUSCULAR | Status: AC
  Filled 2016-09-22: qty 2

## 2016-09-22 MED ORDER — PIPERACILLIN-TAZOBACTAM 3.375 G IVPB
INTRAVENOUS | Status: AC
  Filled 2016-09-22: qty 50

## 2016-09-22 MED ORDER — BUPIVACAINE HCL 0.5 % IJ SOLN
INTRAMUSCULAR | Status: DC | PRN
  Administered 2016-09-22: 15 mL

## 2016-09-22 MED ORDER — OXYCODONE HCL 5 MG PO TABS: 5.0000 mg | ORAL_TABLET | Freq: Once | ORAL | Status: DC | PRN

## 2016-09-22 MED ORDER — OXYCODONE HCL 5 MG/5ML PO SOLN: 5.0000 mg | Freq: Once | ORAL | Status: DC | PRN

## 2016-09-22 MED ORDER — FENTANYL CITRATE (PF) 100 MCG/2ML IJ SOLN
INTRAMUSCULAR | Status: DC | PRN
  Administered 2016-09-22: 50 ug via INTRAVENOUS
  Administered 2016-09-22: 100 ug via INTRAVENOUS
  Administered 2016-09-22 (×2): 25 ug via INTRAVENOUS

## 2016-09-22 MED ORDER — BUPIVACAINE HCL (PF) 0.5 % IJ SOLN
INTRAMUSCULAR | Status: AC
  Filled 2016-09-22: qty 30

## 2016-09-22 MED ORDER — PROMETHAZINE HCL 25 MG/ML IJ SOLN: 6.2500 mg | INTRAMUSCULAR | Status: DC | PRN

## 2016-09-22 MED ORDER — MIDAZOLAM HCL 2 MG/2ML IJ SOLN
INTRAMUSCULAR | Status: DC | PRN
  Administered 2016-09-22: 2 mg via INTRAVENOUS

## 2016-09-22 MED ORDER — LACTATED RINGERS IV SOLN
INTRAVENOUS | Status: DC
  Administered 2016-09-22: 13:00:00 via INTRAVENOUS

## 2016-09-22 MED ORDER — ACETAMINOPHEN 10 MG/ML IV SOLN
INTRAVENOUS | Status: AC
  Filled 2016-09-22: qty 100

## 2016-09-22 MED ORDER — MEPERIDINE HCL 25 MG/ML IJ SOLN: 6.2500 mg | INTRAMUSCULAR | Status: DC | PRN

## 2016-09-22 MED ORDER — FENTANYL CITRATE (PF) 100 MCG/2ML IJ SOLN: 25.0000 ug | INTRAMUSCULAR | Status: DC | PRN

## 2016-09-22 MED ORDER — LIDOCAINE HCL (PF) 1 % IJ SOLN
INTRAMUSCULAR | Status: AC
  Filled 2016-09-22: qty 30

## 2016-09-22 MED ORDER — DEXAMETHASONE SODIUM PHOSPHATE 10 MG/ML IJ SOLN
INTRAMUSCULAR | Status: DC | PRN
  Administered 2016-09-22: 10 mg via INTRAVENOUS

## 2016-09-22 MED ORDER — EPHEDRINE SULFATE 50 MG/ML IJ SOLN
INTRAMUSCULAR | Status: DC | PRN
  Administered 2016-09-22: 10 mg via INTRAVENOUS

## 2016-09-22 MED ORDER — PROPOFOL 10 MG/ML IV BOLUS
INTRAVENOUS | Status: AC
  Filled 2016-09-22: qty 20

## 2016-09-22 SURGICAL SUPPLY — 67 items
BANDAGE ELASTIC 4 LF NS (GAUZE/BANDAGES/DRESSINGS) ×4 IMPLANT
BANDAGE STRETCH 3X4.1 STRL (GAUZE/BANDAGES/DRESSINGS) ×4 IMPLANT
BLADE OSC/SAGITTAL 5.5X25 (BLADE) ×2 IMPLANT
BLADE OSC/SAGITTAL MD 5.5X18 (BLADE) ×2 IMPLANT
BLADE SURG 15 STRL LF DISP TIS (BLADE) ×2 IMPLANT
BLADE SURG 15 STRL SS (BLADE) ×2
BLADE SURG MINI STRL (BLADE) ×2 IMPLANT
BNDG ESMARK 4X12 TAN STRL LF (GAUZE/BANDAGES/DRESSINGS) ×2 IMPLANT
BNDG ESMARK 6X12 TAN STRL LF (GAUZE/BANDAGES/DRESSINGS) ×2 IMPLANT
BNDG GAUZE 4.5X4.1 6PLY STRL (MISCELLANEOUS) ×2 IMPLANT
CANISTER SUCT 1200ML W/VALVE (MISCELLANEOUS) ×2 IMPLANT
DRAPE FLUOR MINI C-ARM 54X84 (DRAPES) ×2 IMPLANT
DRAPE XRAY CASSETTE 23X24 (DRAPES) ×2 IMPLANT
DURAPREP 26ML APPLICATOR (WOUND CARE) ×2 IMPLANT
ELECT REM PT RETURN 9FT ADLT (ELECTROSURGICAL) ×2
ELECTRODE REM PT RTRN 9FT ADLT (ELECTROSURGICAL) ×1 IMPLANT
GAUZE IODOFORM PACK 1/2 7832 (GAUZE/BANDAGES/DRESSINGS) ×2 IMPLANT
GAUZE PETRO XEROFOAM 1X8 (MISCELLANEOUS) ×2 IMPLANT
GAUZE SPONGE 4X4 12PLY STRL (GAUZE/BANDAGES/DRESSINGS) ×2 IMPLANT
GAUZE STRETCH 2X75IN STRL (MISCELLANEOUS) ×2 IMPLANT
GLOVE BIO SURGEON STRL SZ7.5 (GLOVE) ×8 IMPLANT
GLOVE INDICATOR 8.0 STRL GRN (GLOVE) ×8 IMPLANT
GOWN STRL REUS W/ TWL LRG LVL3 (GOWN DISPOSABLE) ×2 IMPLANT
GOWN STRL REUS W/TWL LRG LVL3 (GOWN DISPOSABLE) ×2
HANDLE YANKAUER SUCT BULB TIP (MISCELLANEOUS) ×2 IMPLANT
HANDPIECE INTERPULSE COAX TIP (DISPOSABLE) ×1
KIT RM TURNOVER STRD PROC AR (KITS) ×2 IMPLANT
KIT STIMULAN RAPID CURE 5CC (Orthopedic Implant) ×2 IMPLANT
LABEL OR SOLS (LABEL) ×2 IMPLANT
NDL MAYO CATGUT SZ5 (NEEDLE) ×1
NDL SUT 5 .5 CRC TPR PNT MAYO (NEEDLE) ×1 IMPLANT
NEEDLE FILTER BLUNT 18X 1/2SAF (NEEDLE) ×1
NEEDLE FILTER BLUNT 18X1 1/2 (NEEDLE) ×1 IMPLANT
NEEDLE HYPO 25X1 1.5 SAFETY (NEEDLE) ×6 IMPLANT
NS IRRIG 500ML POUR BTL (IV SOLUTION) ×4 IMPLANT
PACK EXTREMITY ARMC (MISCELLANEOUS) ×2 IMPLANT
PAD ABD DERMACEA PRESS 5X9 (GAUZE/BANDAGES/DRESSINGS) ×4 IMPLANT
PAD CAST CTTN 4X4 STRL (SOFTGOODS) ×1 IMPLANT
PADDING CAST COTTON 4X4 STRL (SOFTGOODS) ×1
RASP SM TEAR CROSS CUT (RASP) ×2 IMPLANT
SET HNDPC FAN SPRY TIP SCT (DISPOSABLE) ×1 IMPLANT
SOL .9 NS 3000ML IRR  AL (IV SOLUTION)
SOL .9 NS 3000ML IRR UROMATIC (IV SOLUTION) IMPLANT
SPLINT CAST 1 STEP 5X30 WHT (MISCELLANEOUS) ×2 IMPLANT
SPLINT FAST PLASTER 5X30 (CAST SUPPLIES) ×1
SPLINT PLASTER CAST FAST 5X30 (CAST SUPPLIES) ×1 IMPLANT
SPONGE LAP 18X18 5 PK (GAUZE/BANDAGES/DRESSINGS) ×2 IMPLANT
STOCKINETTE M/LG 89821 (MISCELLANEOUS) ×2 IMPLANT
STRAP SAFETY BODY (MISCELLANEOUS) ×2 IMPLANT
STRIP CLOSURE SKIN 1/2X4 (GAUZE/BANDAGES/DRESSINGS) ×2 IMPLANT
SUT ETHILON 3-0 FS-10 30 BLK (SUTURE) ×2
SUT ETHILON 5-0 FS-2 18 BLK (SUTURE) ×2 IMPLANT
SUT MNCRL+ 5-0 VIOLET P-3 (SUTURE) ×1 IMPLANT
SUT MONOCRYL 5-0 (SUTURE) ×1
SUT PDS AB 0 CT1 27 (SUTURE) IMPLANT
SUT VIC AB 0 SH 27 (SUTURE) ×2 IMPLANT
SUT VIC AB 2-0 SH 27 (SUTURE) ×2
SUT VIC AB 2-0 SH 27XBRD (SUTURE) ×2 IMPLANT
SUT VIC AB 3-0 SH 27 (SUTURE) ×1
SUT VIC AB 3-0 SH 27X BRD (SUTURE) ×1 IMPLANT
SUT VIC AB 4-0 FS2 27 (SUTURE) ×2 IMPLANT
SUT VICRYL AB 3-0 FS1 BRD 27IN (SUTURE) ×2 IMPLANT
SUTURE EHLN 3-0 FS-10 30 BLK (SUTURE) ×1 IMPLANT
SWABSTK COMLB BENZOIN TINCTURE (MISCELLANEOUS) ×2 IMPLANT
SYR 3ML LL SCALE MARK (SYRINGE) ×2 IMPLANT
SYRINGE 10CC LL (SYRINGE) ×6 IMPLANT
WIRE MAGNUM (SUTURE) ×2 IMPLANT

## 2016-09-22 NOTE — Anesthesia Postprocedure Evaluation (Signed)
Anesthesia Post Note  Patient: Jerene PitchBarney L Achorn  Procedure(s) Performed: Procedure(s) (LRB): AMPUTATION TOE (Right) ACHILLES TENDON LENGTHENING (Right)  Patient location during evaluation: PACU Anesthesia Type: General Level of consciousness: awake and alert and oriented Pain management: pain level controlled Vital Signs Assessment: post-procedure vital signs reviewed and stable Respiratory status: spontaneous breathing, nonlabored ventilation and respiratory function stable Cardiovascular status: blood pressure returned to baseline and stable Postop Assessment: no signs of nausea or vomiting Anesthetic complications: no     Last Vitals:  Vitals:   09/22/16 1515 09/22/16 1530  BP: 139/77 138/87  Pulse: 88 78  Resp: 12 15  Temp: 36.1 C     Last Pain:  Vitals:   09/22/16 1253  TempSrc: Tympanic  PainSc:                  Dustyn Armbrister

## 2016-09-22 NOTE — Progress Notes (Signed)
PT Cancellation Note  Patient Details Name: [name] [name] MRN: [id number] DOB: [birth year]   Cancelled Treatment:    Reason Eval/Treat Not Completed: Patient at procedure or test/unavailable.  Pt off floor for debridement and 2nd toe and metatarsal excision.  Will hold PT until procedure completed.    Encarnacion ChuAshley Abashian PT, DPT 09/22/2016, 1:12 PM

## 2016-09-22 NOTE — H&P (Signed)
  HISTORY AND PHYSICAL INTERVAL NOTE:  09/22/2016  1:34 PM  [name] BundeBarney L Mcfann  has presented today for surgery, with the diagnosis of N/A.  The various methods of treatment have been discussed with the patient.  No guarantees were given.  After consideration of risks, benefits and other options for treatment, the patient has consented to surgery.  I have reviewed the patients' chart and labs.    Patient Vitals for the past 24 hrs:  BP Temp Temp src Pulse Resp SpO2 Height Weight  09/22/16 1253 115/72 (!) 96.6 F (35.9 C) Tympanic 76 18 100 % 6\' 4"  (1.93 m) 108.9 kg (240 lb)  09/22/16 1153 113/71 98.1 F (36.7 C) Oral 63 18 99 % - -  09/22/16 0700 120/72 98 F (36.7 C) Tympanic 72 17 98 % - -  09/22/16 0636 116/68 98.1 F (36.7 C) Oral 72 16 97 % - -  09/22/16 0006 109/70 97.4 F (36.3 C) Oral 72 16 99 % - -    A history and physical examination was performed in my office.  The patient was reexamined.  There have been no changes to this history and physical examination.  Gwyneth RevelsFowler, Katti Pelle A

## 2016-09-22 NOTE — Anesthesia Post-op Follow-up Note (Cosign Needed)
Anesthesia QCDR form completed.        

## 2016-09-22 NOTE — OR Nursing (Signed)
Called [name] [name] [phone] for Vanc and Zosyn 2pm doses - advises she will tube to SDS (1300)

## 2016-09-22 NOTE — OR Nursing (Signed)
Zosyn sent to Or (taken from [facility] pixis) with OR RN, will take Vanc to OR desk when rcvd from pharmacy

## 2016-09-22 NOTE — Transfer of Care (Signed)
Immediate Anesthesia Transfer of Care Note  Patient: [name] [name] L Blake  Procedure(s) Performed: Procedure(s): AMPUTATION TOE (Right) ACHILLES TENDON LENGTHENING (Right)  Patient Location: PACU  Anesthesia Type:General  Level of Consciousness: awake  Airway & Oxygen Therapy: Patient Spontanous Breathing and Patient connected to face mask oxygen  Post-op Assessment: Report given to RN and Post -op Vital signs reviewed and stable  Post vital signs: Reviewed and stable  Last Vitals:  Vitals:   09/22/16 1253 09/22/16 1515  BP: 115/72 139/77  Pulse: 76 88  Resp: 18 12  Temp: (!) 35.9 C 36.1 C    Last Pain:  Vitals:   09/22/16 1253  TempSrc: Tympanic  PainSc:          Complications: No apparent anesthesia complications

## 2016-09-22 NOTE — OR Nursing (Signed)
Floor staff Davonna BellingAnna Bella advises 2pm doses of Zosyn and Vancomycin not in med room - pharmacy called, advises they will tube to SDS [address], [place] aware of same.

## 2016-09-22 NOTE — Progress Notes (Signed)
Pharmacy Antibiotic Note  [name] [name] is a [age] y.o. male admitted on 09/19/2016 with osteomyelitis.  Pharmacy has been consulted for vancomycin and Zosyn dosing. Vancomycin [name]/c 1/26.  Plan: Continue Zosyn 3.375g IV q8h (4 hour infusion).   Height: 6\' 4"  (193 cm) Weight: 240 lb (108.9 kg) IBW/kg (Calculated) : 86.8  Temp (24hrs), Avg:97.8 F (36.6 C), Min:97.4 F (36.3 C), Max:98.1 F (36.7 C)   Recent Labs Lab 09/19/16 0814 09/20/16 0518 09/20/16 2258 09/21/16 0558 09/22/16 0733  WBC 10.3 5.5  --  5.0  --   CREATININE 0.71 0.74  --  0.77  --   VANCOTROUGH  --   --  13*  --  17    Estimated Creatinine Clearance (by C-G formula based on SCr of 0.77 mg/dL) Male: 161.0112.4 mL/min Male: 136.1 mL/min    No Known Allergies  Antimicrobials this admission: cipro x1 1/23 Vanc 1/23 >> 1/26 Zosyn 1/23 >>  Dose adjustments this admission: 1/24 23:00 vanc level 13. Changed to 1250 mg q 8 hours. Level before 4th new dose.   Microbiology results: 1/23 BCx NGTD 1/23 WCx GBS, GNR, GPR 1/25 MRSA PCR negative  Thank you for allowing pharmacy to be a part of this patient's care.  [name] HartChristy, [name] [name] 09/22/2016 11:52 AM

## 2016-09-22 NOTE — Op Note (Signed)
Operative note   Surgeon:Donzell Coller    Assistant:none    Preop diagnosis:1.  Osteomyelitis right 2nd ray  2. Equinus right lower leg    Postop diagnosis:Same    Procedure:1.  Partial 2nd ray amputation right foot  2. Percutaneous tendo-achilles lengthening.right lower leg    EBL:10mg     Anesthesia:local and general    Hemostasis:Thigh tourniquet inflated to 250 mmHg for approximately 20 minutes    Specimen: Amputated second toe and metatarsal with osteomyelitis and wound culture    Complications: None    Operative indications:[name] [name] is an 56 y.o. that presents today for surgical intervention.  The risks/benefits/alternatives/complications have been discussed and consent has been given.    Procedure:  Patient was brought into the OR and placed on the operating table in thesupine position. After anesthesia was obtained theright lower extremity was prepped and draped in usual sterile fashion.  Attention was directed initially to the posterior aspect of the right lower extremity where 3 percutaneous incisions were performed. 3 hemi-sections were performed at 13 and 5 cm from the Achilles tendon insertion. Dori flexion was performed and good excursion of the Achilles tendon was noted. At this time the incisions were closed with a 3-0 nylon.  Attention was then directed to the dorsal aspect of the second toe where a longitudinal incision was performed along the dorsal second metatarsal circumferential around the second toe at the MTPJ. Full-thickness incision was then performed. The second toe was then disarticulated at the metatarsophalangeal joint and removed from the surgical field in toto. Next utilizing a power saw the distal one third of the second metatarsal was then excised. The wound was flushed with copious amount of irrigation and all bleeders were Bovie cauterized. After copious amounts or irrigation the wound was then packed with Surgicel. Next vancomycin impregnated  Stimulan calcium sulfate beads were then placed into the wound. The incision was then closed dorsally with a 3-0 nylon. The plantar aspect at the ulcerative site of the second MTPJ was packed with iodoform packing. A large bulky sterile dressing was applied after infiltration with 0.5% Marcaine. Patient was placed in a posterior splint as well. He'll be readmitted to the floor.    Patient tolerated the procedure and anesthesia well.  Was transported from the OR to the PACU with all vital signs stable and vascular status intact. To be discharged per routine protocol.  Will follow up in approximately 1 week in the outpatient clinic.

## 2016-09-22 NOTE — Anesthesia Preprocedure Evaluation (Signed)
Anesthesia Evaluation  Patient identified by MRN, date of birth, ID band Patient awake    Reviewed: Allergy & Precautions, NPO status , Patient's Chart, lab work & pertinent test results  History of Anesthesia Complications Negative for: history of anesthetic complications  Airway Mallampati: II  TM Distance: >3 FB Neck ROM: Full    Dental  (+) Edentulous Upper, Edentulous Lower   Pulmonary neg sleep apnea, neg COPD, Current Smoker,    breath sounds clear to auscultation- rhonchi (-) wheezing      Cardiovascular Exercise Tolerance: Good (-) hypertension(-) CAD and (-) Past MI  Rhythm:Regular Rate:Normal - Systolic murmurs and - Diastolic murmurs    Neuro/Psych negative neurological ROS  negative psych ROS   GI/Hepatic negative GI ROS, Neg liver ROS,   Endo/Other  negative endocrine ROSneg diabetes  Renal/GU negative Renal ROS     Musculoskeletal negative musculoskeletal ROS (+)   Abdominal (+) - obese,   Peds  Hematology  (+) anemia ,   Anesthesia Other Findings Past Medical History: No date: Anemia of chronic disease No date: Tobacco use disorder No date: Toe gangrene (HCC)   Reproductive/Obstetrics                             Anesthesia Physical Anesthesia Plan  ASA: II  Anesthesia Plan: General   Post-op Pain Management:    Induction: Intravenous  Airway Management Planned: Oral ETT  Additional Equipment:   Intra-op Plan:   Post-operative Plan: Extubation in OR  Informed Consent: I have reviewed the patients History and Physical, chart, labs and discussed the procedure including the risks, benefits and alternatives for the proposed anesthesia with the patient or authorized representative who has indicated his/her understanding and acceptance.   Dental advisory given  Plan Discussed with: CRNA and Anesthesiologist  Anesthesia Plan Comments:         Anesthesia  Quick Evaluation

## 2016-09-22 NOTE — Consult Note (Addendum)
[facility] Infectious Disease     Reason for Consult: osteomyelitis R 2nd ray    Referring Physician: Bettey Costa Date of Admission:  09/19/2016   Active Problems:   Acute osteomyelitis of toe of right foot ([place])   HPI: AZAEL RAGAIN is a 56 y.o. male admitted with R foot pain and swelling over a week. He has hx of PVD, Tobacco absue. Was seen in ED 1 month ago for ulcer bottom of R foot and rxed abx but did not take.  Prior toe amputation on other foot a year ago  09/06/15 with cx growing proteus.  He had surgical resection 1/26. MRI showed deeper infection as well.    Past Medical History:  Diagnosis Date  . Anemia of chronic disease   . Tobacco use disorder   . Toe gangrene [facility])    Past Surgical History:  Procedure Laterality Date  . AMPUTATION TOE Left 07/12/2015   Procedure: AMPUTATION TOE;  Surgeon: Samara Deist, DPM;  Location: [facility] ORS;  Service: Podiatry;  Laterality: Left;  . IRRIGATION AND DEBRIDEMENT FOOT Left 07/12/2015   Procedure: IRRIGATION AND DEBRIDEMENT FOOT;  Surgeon: Samara Deist, DPM;  Location: [facility] ORS;  Service: Podiatry;  Laterality: Left;  . IRRIGATION AND DEBRIDEMENT FOOT Left 09/06/2015   Procedure: IRRIGATION AND DEBRIDEMENT FOOT;  Surgeon: Albertine Patricia, DPM;  Location: [facility] ORS;  Service: Podiatry;  Laterality: Left;   Social History  Substance Use Topics  . Smoking status: Current Every Day Smoker    Packs/day: 0.50    Types: Cigarettes  . Smokeless tobacco: Never Used  . Alcohol use 2.4 oz/week    4 Cans of beer per week     Comment: daily   Family History  Problem Relation Age of Onset  . CVA Mother   . CVA Brother   . Diabetes Mellitus II Father     Allergies: No Known Allergies  Current antibiotics: Antibiotics Given (last 72 hours)    Date/Time Action Medication Dose Rate   09/19/16 2124 Given   [MAR Hold] piperacillin-tazobactam (ZOSYN) IVPB 3.375 g (MAR Hold since 09/22/16 1251) 3.375 g 12.5 mL/hr   09/20/16 0013 Given    vancomycin (VANCOCIN) IVPB 1000 mg/200 mL premix 1,000 mg 200 mL/hr   09/20/16 0537 Given   [MAR Hold] piperacillin-tazobactam (ZOSYN) IVPB 3.375 g (MAR Hold since 09/22/16 1251) 3.375 g 12.5 mL/hr   09/20/16 0814 Given   vancomycin (VANCOCIN) IVPB 1000 mg/200 mL premix 1,000 mg 200 mL/hr   09/20/16 1452 Given   [MAR Hold] piperacillin-tazobactam (ZOSYN) IVPB 3.375 g (MAR Hold since 09/22/16 1251) 3.375 g 12.5 mL/hr   09/20/16 1753 Given   vancomycin (VANCOCIN) IVPB 1000 mg/200 mL premix 1,000 mg 200 mL/hr   09/20/16 2104 Given   [MAR Hold] piperacillin-tazobactam (ZOSYN) IVPB 3.375 g (MAR Hold since 09/22/16 1251) 3.375 g 12.5 mL/hr   09/20/16 2330 Given   vancomycin (VANCOCIN) IVPB 1000 mg/200 mL premix 1,000 mg 200 mL/hr   09/21/16 0526 Given   [MAR Hold] piperacillin-tazobactam (ZOSYN) IVPB 3.375 g (MAR Hold since 09/22/16 1251) 3.375 g 12.5 mL/hr   09/21/16 0856 Given   vancomycin (VANCOCIN) 1,250 mg in sodium chloride 0.9 % 250 mL IVPB 1,250 mg 166.7 mL/hr   09/21/16 1421 Given   [MAR Hold] piperacillin-tazobactam (ZOSYN) IVPB 3.375 g (MAR Hold since 09/22/16 1251) 3.375 g 12.5 mL/hr   09/21/16 1704 Given   vancomycin (VANCOCIN) 1,250 mg in sodium chloride 0.9 % 250 mL IVPB 1,250 mg 166.7 mL/hr  09/21/16 2206 Given   [MAR Hold] piperacillin-tazobactam (ZOSYN) IVPB 3.375 g (MAR Hold since 09/22/16 1251) 3.375 g 12.5 mL/hr   09/21/16 2345 Given   vancomycin (VANCOCIN) 1,250 mg in sodium chloride 0.9 % 250 mL IVPB 1,250 mg 166.7 mL/hr   09/22/16 0559 Given   [MAR Hold] piperacillin-tazobactam (ZOSYN) IVPB 3.375 g (MAR Hold since 09/22/16 1251) 3.375 g 12.5 mL/hr   09/22/16 0824 Given   vancomycin (VANCOCIN) 1,250 mg in sodium chloride 0.9 % 250 mL IVPB 1,250 mg 166.7 mL/hr   09/22/16 1401 Given   [MAR Hold] piperacillin-tazobactam (ZOSYN) IVPB 3.375 g (MAR Hold since 09/22/16 1251) 3.375 g       MEDICATIONS: . chlorhexidine  60 mL Topical Once  . [MAR Hold] enoxaparin  (LOVENOX) injection  40 mg Subcutaneous Q24H  . [MAR Hold] folic acid  1 mg Oral Daily  . [MAR Hold] multivitamin with minerals  1 tablet Oral Daily  . [MAR Hold] nicotine  14 mg Transdermal Daily  . [MAR Hold] piperacillin-tazobactam (ZOSYN)  IV  3.375 g Intravenous Q8H  . [MAR Hold] thiamine  100 mg Oral Daily   Or  . [MAR Hold] thiamine  100 mg Intravenous Daily    Review of Systems - 11 systems reviewed and negative per HPI   OBJECTIVE: Temp:  [96.6 F (35.9 C)-98.1 F (36.7 C)] 97.7 F (36.5 C) (01/26 1545) Pulse Rate:  [63-88] 81 (01/26 1545) Resp:  [12-18] 14 (01/26 1545) BP: (109-139)/(68-87) 134/81 (01/26 1545) SpO2:  [97 %-100 %] 97 % (01/26 1545) Weight:  [108.9 kg (240 lb)] 108.9 kg (240 lb) (01/26 1253) Physical Exam  Constitutional: He is oriented to person, place, and time. He appears well-developed and well-nourished. No distress.  HENT:  Mouth/Throat: Oropharynx is clear and moist. No oropharyngeal exudate.  Cardiovascular: Normal rate, regular rhythm and normal heart sounds. Pulmonary/Chest: Effort normal and breath sounds normal. No respiratory distress. He has no wheezes.  Abdominal: Soft. Bowel sounds are normal. He exhibits no distension. There is no tenderness.  Lymphadenopathy:  He has no cervical adenopathy.  Neurological: He is alert and oriented to person, place, and time.  Skin: R foot wraped post op Psychiatric: He has a normal mood and affect. His behavior is normal.   LABS: Results for orders placed or performed during the hospital encounter of 09/19/16 (from the past 48 hour(s))  Vancomycin, trough     Status: Abnormal   Collection Time: 09/20/16 10:58 PM  Result Value Ref Range   Vancomycin Tr 13 (L) 15 - 20 ug/mL  CBC     Status: Abnormal   Collection Time: 09/21/16  5:58 AM  Result Value Ref Range   WBC 5.0 3.8 - 10.6 K/uL   RBC 3.58 (L) 4.40 - 5.90 MIL/uL   Hemoglobin 12.5 (L) 13.0 - 18.0 g/dL   HCT 35.6 (L) 40.0 - 52.0 %   MCV 99.4  80.0 - 100.0 fL   MCH 35.0 (H) 26.0 - 34.0 pg   MCHC 35.2 32.0 - 36.0 g/dL   RDW 13.2 11.5 - 14.5 %   Platelets 303 150 - 440 K/uL  Basic metabolic panel     Status: Abnormal   Collection Time: 09/21/16  5:58 AM  Result Value Ref Range   Sodium 138 135 - 145 mmol/L   Potassium 3.7 3.5 - 5.1 mmol/L   Chloride 105 101 - 111 mmol/L   CO2 26 22 - 32 mmol/L   Glucose, Bld 109 (H) 65 - 99  mg/dL   BUN 9 6 - 20 mg/dL   Creatinine, Ser 0.77 0.61 - 1.24 mg/dL   Calcium 8.7 (L) 8.9 - 10.3 mg/dL   GFR calc non Af Amer >60 >60 mL/min   GFR calc Af Amer >60 >60 mL/min    Comment: (NOTE) The eGFR has been calculated using the CKD EPI equation. This calculation has not been validated in all clinical situations. eGFR's persistently <60 mL/min signify possible Chronic Kidney Disease.    Anion gap 7 5 - 15  Surgical PCR screen     Status: None   Collection Time: 09/21/16 10:17 PM  Result Value Ref Range   MRSA, PCR NEGATIVE NEGATIVE   Staphylococcus aureus NEGATIVE NEGATIVE    Comment:        The Xpert SA Assay (FDA approved for NASAL specimens in patients over 56 years of age), is one component of a comprehensive surveillance program.  Test performance has been validated by [facility] for patients greater than or equal to 64 year old. It is not intended to diagnose infection nor to guide or monitor treatment.   Vancomycin, trough     Status: None   Collection Time: 09/22/16  7:33 AM  Result Value Ref Range   Vancomycin Tr 17 15 - 20 ug/mL  Glucose, capillary     Status: Abnormal   Collection Time: 09/22/16  3:19 PM  Result Value Ref Range   Glucose-Capillary 121 (H) 65 - 99 mg/dL   No components found for: ESR, C REACTIVE PROTEIN MICRO: Recent Results (from the past 720 hour(s))  Wound or Superficial Culture     Status: None (Preliminary result)   Collection Time: 09/19/16  9:57 AM  Result Value Ref Range Status   Specimen Description WOUND  Final   Special Requests NONE   Final   Gram Stain   Final    RARE WBC PRESENT, PREDOMINANTLY MONONUCLEAR MODERATE GRAM POSITIVE RODS RARE GRAM POSITIVE COCCI IN PAIRS IN SINGLES RARE GRAM NEGATIVE RODS    Culture   Final    MODERATE GROUP B STREP(S.AGALACTIAE)ISOLATED TESTING AGAINST S. AGALACTIAE NOT ROUTINELY PERFORMED DUE TO PREDICTABILITY OF AMP/PEN/VAN SUSCEPTIBILITY. CULTURE REINCUBATED FOR BETTER GROWTH Performed at [facility], [place] [address], [place],  [address]    Report Status PENDING  Incomplete  Blood culture (routine x 2)     Status: None (Preliminary result)   Collection Time: 09/19/16  9:58 AM  Result Value Ref Range Status   Specimen Description BLOOD R AC  Final   Special Requests   Final    BOTTLES DRAWN AEROBIC AND ANAEROBIC AER 8ML ANA 12ML   Culture NO GROWTH 3 DAYS  Final   Report Status PENDING  Incomplete  Blood culture (routine x 2)     Status: None (Preliminary result)   Collection Time: 09/19/16  9:58 AM  Result Value Ref Range Status   Specimen Description BLOOD L FA  Final   Special Requests   Final    BOTTLES DRAWN AEROBIC AND ANAEROBIC AER 10ML ANA 11ML   Culture NO GROWTH 3 DAYS  Final   Report Status PENDING  Incomplete  Surgical PCR screen     Status: None   Collection Time: 09/21/16 10:17 PM  Result Value Ref Range Status   MRSA, PCR NEGATIVE NEGATIVE Final   Staphylococcus aureus NEGATIVE NEGATIVE Final    Comment:        The Xpert SA Assay (FDA approved for NASAL specimens in patients  over 4 years of age), is one component of a comprehensive surveillance program.  Test performance has been validated by [facility] for patients greater than or equal to 29 year old. It is not intended to diagnose infection nor to guide or monitor treatment.     IMAGING: Mr Foot Right Wo Contrast  Addendum Date: 09/20/2016   ADDENDUM REPORT: 09/20/2016 16:23 ADDENDUM: The third toe is normal in appearance. Edematous change is present throughout the second toe  which is dorsally and laterally dislocated. Edema is also seen throughout the second metatarsal. Findings are consistent osteomyelitis. Abscess off the plantar surface of the head of the second metatarsal is noted. Please disregard the second conclusion in the initially dictated report. Electronically Signed   By: Inge Rise M.D.   On: 09/20/2016 16:23   Result Date: 09/20/2016 CLINICAL DATA:  Plantar skin ulceration right foot EXAM: MRI OF THE RIGHT FOREFOOT WITHOUT CONTRAST TECHNIQUE: Multiplanar, multisequence MR imaging of the right foot was performed. No intravenous contrast was administered. COMPARISON:  Plain films right foot 09/19/2016. FINDINGS: Bones/Joint/Cartilage Marrow edema is seen throughout the third toe. The third toe is dorsally dislocated at the MTP joint. Marrow edema is also identified throughout the second metatarsal, most intense in the metatarsal head and neck. Bony destructive change in the head of the proximal phalanx of the fourth toe is seen as on prior plain films. Marrow edema is also seen in the medial and middle cuneiform and central aspect of the navicular. Bone marrow signal is otherwise unremarkable. Ligaments Negative. Muscles and Tendons Intrinsic musculature the foot is markedly atrophied. Soft tissues Plantar wound is seen deep to the distal first metatarsal. There is a heterogeneous fluid collection deep to the wound measuring 3.6 cm long by 4.2 cm craniocaudal by 2.6 cm transverse most consistent with abscess. Subcutaneous edema is present about the foot. IMPRESSION: Plantar soft tissue wound with underlying abscess about the second metatarsal head. Multifocal marrow edema most notable throughout the third toe and second metatarsal worrisome for osteomyelitis. Milder degree of marrow edema in the navicular and medial and middle cuneiforms could also be due to osteomyelitis. Destructive change in the proximal phalanx of the fourth toe without marrow edema consistent  with chronic osteomyelitis. Electronically Signed: By: Inge Rise M.D. On: 09/20/2016 11:18   Dg Foot Complete Right  Result Date: 09/19/2016 CLINICAL DATA:  Pain swelling in the right first and second toes EXAM: RIGHT FOOT COMPLETE - 3+ VIEW COMPARISON:  Right foot films of 08/04/2016 FINDINGS: There is chronic dislocation of the right second MTP joint. There is focal rim demineralization and erosion upper portion of the head and neck of the right second metatarsal consistent with active osteomyelitis with adjacent soft tissue swelling. There is also periosteal reaction along the proximal phalanx of the second digit consistent with osteomyelitis as well. In addition there is abnormality of the distal portion of the proximal phalanx of the right fourth digit also worrisome osteomyelitis which may be involving the middle phalanx of the fourth digit as well. There is soft tissue swelling over the dorsum of the foot is well. A small plantar calcaneal degenerative spurs noted in there are degenerative changes in the midfoot. IMPRESSION: 1. Changes of active osteomyelitis involving the head of the right second metatarsal and proximal phalanx of the right second metatarsal where there is periosteal reaction as well. 2. Osteomyelitis involves the distal portion of the proximal phalanx of the right fourth digit as well. Electronically Signed  By: Ivar Drape M.D.   On: 09/19/2016 09:25    Assessment:   GOVIND FUREY is a 56 y.o. male with PVD, tobacco abuse and R foot wound with abscess and osteomyelitis.  MRI shows possible infection in multiple bones inclding 3rd toe, 2nd MT and navicular and cuneiforms.  Has been on vanco and zosyn.  Wound cx growing GB strep, but gram stain also showed GNR and GPR. MRSA PCR neg and no MRSA isolated so far.  S/p surgery 1/26- path and cxs pending  Recommendations Check ESR and CRP for baseline Place PICC Would rec IV zosyn (could use unasyn but difficult to  administer at home) for 4-6 weeks   Thank you very much for allowing me to participate in the care of this patient. Please call with questions.   Cheral Marker. Ola Spurr, MD

## 2016-09-22 NOTE — Anesthesia Procedure Notes (Signed)
Procedure Name: LMA Insertion Date/Time: 09/22/2016 1:58 PM Performed by: Randa Lynn, Tyronne Blann Pre-anesthesia Checklist: Timeout performed, Patient being monitored, Emergency Drugs available, Patient identified and Suction available Patient Re-evaluated:Patient Re-evaluated prior to inductionOxygen Delivery Method: Circle system utilized Intubation Type: IV induction Ventilation: Mask ventilation without difficulty Laryngoscope Size: Mac and 3 Grade View: Grade I Tube type: Oral Number of attempts: 1 Airway Equipment and Method: Stylet Placement Confirmation: ETT inserted through vocal cords under direct vision,  positive ETCO2 and breath sounds checked- equal and bilateral Dental Injury: Teeth and Oropharynx as per pre-operative assessment

## 2016-09-22 NOTE — Progress Notes (Signed)
[facility] - [place] at [facility]   PATIENT NAME: [name] [name]    MR#:  [id number]  DATE OF BIRTH:  1961-06-03  SUBJECTIVE:    Going to OR at noon today  REVIEW OF SYSTEMS:    Review of Systems  Constitutional: Negative.  Negative for chills, fever and malaise/fatigue.  HENT: Negative.  Negative for ear discharge, ear pain, hearing loss, nosebleeds and sore throat.   Eyes: Negative.  Negative for blurred vision and pain.  Respiratory: Negative.  Negative for cough, hemoptysis, shortness of breath and wheezing.   Cardiovascular: Negative.  Negative for chest pain, palpitations and leg swelling.  Gastrointestinal: Negative.  Negative for abdominal pain, blood in stool, diarrhea, nausea and vomiting.  Genitourinary: Negative.  Negative for dysuria.  Musculoskeletal: Negative.  Negative for back pain.  Skin: Negative.        Covered right foot  Neurological: Negative for dizziness, tremors, speech change, focal weakness, seizures and headaches.  Endo/Heme/Allergies: Negative.  Does not bruise/bleed easily.  Psychiatric/Behavioral: Negative.  Negative for depression, hallucinations and suicidal ideas.    Tolerating Diet: Nothing by mouth      DRUG ALLERGIES:  No Known Allergies  VITALS:  Blood pressure 120/72, pulse 72, temperature 98 F (36.7 C), temperature source Tympanic, resp. rate 17, height 6\' 4"  (1.93 m), weight 108.9 kg (240 lb), SpO2 98 %.  PHYSICAL EXAMINATION:   Physical Exam  Constitutional: He is oriented to person, place, and time and well-developed, well-nourished, and in no distress. No distress.  HENT:  Head: Normocephalic.  Eyes: No scleral icterus.  Neck: Normal range of motion. Neck supple. No JVD present. No tracheal deviation present.  Cardiovascular: Normal rate, regular rhythm and normal heart sounds.  Exam reveals no gallop and no friction rub.   No murmur heard. Pulmonary/Chest: Effort normal and breath sounds normal. No  respiratory distress. He has no wheezes. He has no rales. He exhibits no tenderness.  Abdominal: Soft. Bowel sounds are normal. He exhibits no distension and no mass. There is no tenderness. There is no rebound and no guarding.  Musculoskeletal: Normal range of motion. He exhibits no edema.  Neurological: He is alert and oriented to person, place, and time.  Skin: Skin is warm. No rash noted. No erythema.  Right foot covered Left big toe amputated  Psychiatric: Affect and judgment normal.      LABORATORY PANEL:   CBC  Recent Labs Lab 09/21/16 0558  WBC 5.0  HGB 12.5*  HCT 35.6*  PLT 303   ------------------------------------------------------------------------------------------------------------------  Chemistries   Recent Labs Lab 09/19/16 0814  09/21/16 0558  NA 137  < > 138  K 3.8  < > 3.7  CL 102  < > 105  CO2 26  < > 26  GLUCOSE 110*  < > 109*  BUN 6  < > 9  CREATININE 0.71  < > 0.77  CALCIUM 9.2  < > 8.7*  AST 14*  --   --   ALT 10*  --   --   ALKPHOS 70  --   --   BILITOT 1.0  --   --   < > = values in this interval not displayed. ------------------------------------------------------------------------------------------------------------------  Cardiac Enzymes No results for input(s): TROPONINI in the last 168 hours. ------------------------------------------------------------------------------------------------------------------  RADIOLOGY:  No results found.   ASSESSMENT AND PLAN:   56 year old male with history of tobacco dependence who presents with a right foot pain.   1. 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 EtOH  abuse: Uneventful detoxification  Continue CIWA protocol  3. Anemia of chronic disease: Hemoglobin stable  4. Tobacco dependence: Continue nicotine patch    Management plans discussed with the patient and he is in agreement.  CODE STATUS: full  TOTAL TIME TAKING CARE OF THIS PATIENT: 24 minutes.     POSSIBLE D/C 2 days, DEPENDING ON CLINICAL CONDITION.   Markeis Allman M.D on 09/22/2016 at 11:07 AM  Between 7am to 6pm - Pager - [phone] After 6pm go to [email or web address] - Social research officer, governmentpassword EPAS [facility]  [facility] [place] [facility]  Office  [phone]  CC: Primary care physician; No PCP Per Patient  Note: This dictation was prepared with Dragon dictation along with smaller phrase technology. Any transcriptional errors that result from this process are unintentional.

## 2016-09-23 LAB — CBC
HCT: 33.1 % — ABNORMAL LOW (ref 40.0–52.0)
Hemoglobin: 12 g/dL — ABNORMAL LOW (ref 13.0–18.0)
MCH: 35.2 pg — ABNORMAL HIGH (ref 26.0–34.0)
MCHC: 36.2 g/dL — ABNORMAL HIGH (ref 32.0–36.0)
MCV: 97.2 fL (ref 80.0–100.0)
Platelets: 359 10*3/uL (ref 150–440)
RBC: 3.41 MIL/uL — ABNORMAL LOW (ref 4.40–5.90)
RDW: 12.7 % (ref 11.5–14.5)
WBC: 9.6 10*3/uL (ref 3.8–10.6)

## 2016-09-23 LAB — AEROBIC CULTURE  (SUPERFICIAL SPECIMEN)

## 2016-09-23 LAB — C-REACTIVE PROTEIN: CRP: 1.4 mg/dL — ABNORMAL HIGH (ref <1.0)

## 2016-09-23 LAB — AEROBIC CULTURE W GRAM STAIN (SUPERFICIAL SPECIMEN)

## 2016-09-23 LAB — SEDIMENTATION RATE: Sed Rate: 52 mm/hr — ABNORMAL HIGH (ref 0–20)

## 2016-09-23 NOTE — Evaluation (Signed)
Physical Therapy Evaluation Patient Details Name: [name] [name] MRN: [id number] DOB: [birth year] Today's Date: 09/23/2016   History of Present Illness  [name] [name] is a 56yo black male who comes [facility] on 1/23 after worsening pain in his Rt midfoot/forefoot. He has been followed for 1 month for a foot ulcer. Upon admission, imaging revealing osteomyelitis. He underwent amputation of the second ray at the distal third of the second metatarsal on 1/26 and is now NWB on Rt. PMH inlucdes Left hallux amputation., anemia, PVD, and tobacco use. 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   Clinical Impression  Pt demonstrating household distance AMB with RW at supervision level and management of stairs with minGuard assist for safety. Has adequate support at home for help with IADL/ADL. He is able to repeat verbal and demonstrate throughout action his NWB restrictions on the RLE. He demonstrates mild functional weakness in the BUE during use of RW and LLE during transfers and stairs performance and will benefit from skilled PT intervention to improve independent safe mobility. No additional PT services needed at this venue of care. The patient will benefit from HHPT to work on crutch training (patient already has crutches from last surgery), and stairs training with crutches, as well as improvement of safe, independent mobility suitable for accessing the community. PT signing off at this time.     Follow Up Recommendations Home health PT (Stairs training, crustch training, strengthening. )    Equipment Recommendations   (Tall Rolling Walker: Pt is 6'4" and 240lbs; BSC for toilet riser. )    Recommendations for Other Services       Precautions / Restrictions Precautions Precautions: None Restrictions Weight Bearing Restrictions: Yes RLE Weight Bearing: Non weight bearing      Mobility  Bed Mobility Overal bed mobility:  Independent                Transfers Overall transfer level: Needs assistance   Transfers: Sit to/from Stand Sit to Stand: Supervision         General transfer comment: RW, NWB on RLE  Ambulation/Gait Ambulation/Gait assistance: Supervision Ambulation Distance (Feet): 240 Feet Assistive device: Rolling walker (2 wheeled) (tall rolling walker ) Gait Pattern/deviations:  (Step to, 2-point gait, Rt NWB)   Gait velocity interpretation: Below normal speed for age/gender    Stairs Stairs: Yes Stairs assistance: Min guard Stair Management: Two rails Number of Stairs: 7 General stair comments: NWB descending, but has difficulty with NWB ascending.  Wheelchair Mobility    Modified Rankin (Stroke Patients Only)       Balance Overall balance assessment: No apparent balance deficits (not formally assessed)                                           Pertinent Vitals/Pain Pain Assessment: No/denies pain    Home Living Family/patient expects to be discharged to:: Private residence Living Arrangements: Other relatives;Other (Comment) (Brother ) Available Help at Discharge: Family Type of Home: House Home Access: Stairs to enter Entrance Stairs-Rails: Right;Can reach Software engineer of Steps: 5 Home Layout: One level Home Equipment: Crutches      Prior Function Level of Independence: Independent               Hand Dominance  Extremity/Trunk Assessment   Upper Extremity Assessment Upper Extremity Assessment: Overall WFL for tasks assessed    Lower Extremity Assessment Lower Extremity Assessment: Overall WFL for tasks assessed       Communication   Communication: No difficulties  Cognition Arousal/Alertness: Awake/alert Behavior During Therapy: WFL for tasks assessed/performed Overall Cognitive Status: Within Functional Limits for tasks assessed                      General Comments       Exercises     Assessment/Plan    PT Assessment All further PT needs can be met in the next venue of care  PT Problem List Decreased strength;Decreased mobility          PT Treatment Interventions      PT Goals (Current goals can be found in the Care Plan section)  Acute Rehab PT Goals Patient Stated Goal: Return home.  PT Goal Formulation: All assessment and education complete, DC therapy    Frequency     Barriers to discharge        Co-evaluation               End of Session Equipment Utilized During Treatment: Gait belt Activity Tolerance: Patient tolerated treatment well;Patient limited by fatigue Patient left: in bed;with nursing/sitter in room Nurse Communication: Mobility status;Other (comment);Weight bearing status         Time: 0914-0930 PT Time Calculation (min) (ACUTE ONLY): 16 min   Charges:   PT Evaluation $PT Eval Low Complexity: 1 Procedure PT Treatments $Gait Training: 8-22 mins   9:45 AM, 09/23/16 Etta Grandchild, PT, DPT Physical Therapist - [place] [phone] (mobile)

## 2016-09-23 NOTE — Progress Notes (Signed)
[facility] - [place] at [facility]   PATIENT NAME: [name] [name]    MR#:  [id number]  DATE OF BIRTH:  [birth year]  SUBJECTIVE:    Patient went to OR yesterday dressing just changed by Dr Ether GriffinsFowler  REVIEW OF SYSTEMS:    Review of Systems  Constitutional: Negative.  Negative for chills, fever and malaise/fatigue.  HENT: Negative.  Negative for ear discharge, ear pain, hearing loss, nosebleeds and sore throat.   Eyes: Negative.  Negative for blurred vision and pain.  Respiratory: Negative.  Negative for cough, hemoptysis, shortness of breath and wheezing.   Cardiovascular: Negative.  Negative for chest pain, palpitations and leg swelling.  Gastrointestinal: Negative.  Negative for abdominal pain, blood in stool, diarrhea, nausea and vomiting.  Genitourinary: Negative.  Negative for dysuria.  Musculoskeletal: Negative.  Negative for back pain.  Skin: Negative.        Covered right foot  Neurological: Negative for dizziness, tremors, speech change, focal weakness, seizures and headaches.  Endo/Heme/Allergies: Negative.  Does not bruise/bleed easily.  Psychiatric/Behavioral: Negative.  Negative for depression, hallucinations and suicidal ideas.    Tolerating Diet: yes     DRUG ALLERGIES:  No Known Allergies  VITALS:  Blood pressure 107/64, pulse 71, temperature 97.4 F (36.3 C), temperature source Oral, resp. rate 17, height 6\' 4"  (1.93 m), weight 108.9 kg (240 lb), SpO2 99 %.  PHYSICAL EXAMINATION:   Physical Exam  Constitutional: He is oriented to person, place, and time and well-developed, well-nourished, and in no distress. No distress.  HENT:  Head: Normocephalic.  Eyes: No scleral icterus.  Neck: Normal range of motion. Neck supple. No JVD present. No tracheal deviation present.  Cardiovascular: Normal rate, regular rhythm and normal heart sounds.  Exam reveals no gallop and no friction rub.   No murmur heard. Pulmonary/Chest: Effort normal and breath  sounds normal. No respiratory distress. He has no wheezes. He has no rales. He exhibits no tenderness.  Abdominal: Soft. Bowel sounds are normal. He exhibits no distension and no mass. There is no tenderness. There is no rebound and no guarding.  Musculoskeletal: Normal range of motion. He exhibits no edema.  Neurological: He is alert and oriented to person, place, and time.  Skin: Skin is warm. No rash noted. No erythema.  PICC line right arm  Right foot covered Left big toe amputated  Psychiatric: Affect and judgment normal.      LABORATORY PANEL:   CBC  Recent Labs Lab 09/23/16 0424  WBC 9.6  HGB 12.0*  HCT 33.1*  PLT 359   ------------------------------------------------------------------------------------------------------------------  Chemistries   Recent Labs Lab 09/19/16 0814  09/21/16 0558  NA 137  < > 138  K 3.8  < > 3.7  CL 102  < > 105  CO2 26  < > 26  GLUCOSE 110*  < > 109*  BUN 6  < > 9  CREATININE 0.71  < > 0.77  CALCIUM 9.2  < > 8.7*  AST 14*  --   --   ALT 10*  --   --   ALKPHOS 70  --   --   BILITOT 1.0  --   --   < > = values in this interval not displayed. ------------------------------------------------------------------------------------------------------------------  Cardiac Enzymes No results for input(s): TROPONINI in the last 168 hours. ------------------------------------------------------------------------------------------------------------------  RADIOLOGY:  No results found.   ASSESSMENT AND PLAN:   56 year old male with history of tobacco dependence who presents with a right foot pain.  1. Right second toe osteomyelitis involving right second metatarsal and proximal phalanx:  POD#1  Partial 2nd ray amputation right foot  and Percutaneous tendo-achilles lengthening.right lower leg Wound culture group B strep ID recommending PICC line and long-term (4-6 Weeks)  IV antibiotics Zosyn  Case management consulted   2. EtOH  abuse: Uneventful detoxification   3. Anemia of chronic disease: Hemoglobin stable  4. Tobacco dependence: Continue nicotine patch    Management plans discussed with the patient and he is in agreement.  CODE STATUS: full  TOTAL TIME TAKING CARE OF THIS PATIENT: 22 minutes.     POSSIBLE D/C Monday, DEPENDING ON CLINICAL CONDITION.   Laraya Pestka M.D on 09/23/2016 at 11:30 AM  Between 7am to 6pm - Pager - [phone] After 6pm go to [email or web address] - Social research officer, government  [facility] [place] [facility]  Office  [phone]  CC: Primary care physician; No PCP Per Patient  Note: This dictation was prepared with Dragon dictation along with smaller phrase technology. Any transcriptional errors that result from this process are unintentional.

## 2016-09-23 NOTE — Progress Notes (Signed)
Daily Progress Note   Subjective  - 1 Day Post-Op  F/u right foot TAL and 2nd ray amputation.  Doing well  Objective Vitals:   09/22/16 1604 09/22/16 1710 09/22/16 2106 09/23/16 0426  BP: 128/78 134/78 (!) 161/69 124/70  Pulse: 82 76 83 67  Resp: 20 20 18 16   Temp: 97.4 F (36.3 C)  98.4 F (36.9 C) 98 F (36.7 C)  TempSrc: Oral  Oral Oral  SpO2: 98% 99% 99% 97%  Weight:      Height:        Physical Exam: Dressing changed and packing removed and replaced.  No purulence.  Some bloody drainage as expected.  Laboratory CBC    Component Value Date/Time   WBC 9.6 09/23/2016 0424   HGB 12.0 (L) 09/23/2016 0424   HGB 12.1 (L) 07/14/2012 1726   HCT 33.1 (L) 09/23/2016 0424   HCT 35.9 (L) 07/14/2012 1726   PLT 359 09/23/2016 0424   PLT 310 07/14/2012 1726    BMET    Component Value Date/Time   NA 138 09/21/2016 0558   NA 140 07/14/2012 1726   K 3.7 09/21/2016 0558   K 3.8 07/14/2012 1726   CL 105 09/21/2016 0558   CL 106 07/14/2012 1726   CO2 26 09/21/2016 0558   CO2 26 07/14/2012 1726   GLUCOSE 109 (H) 09/21/2016 0558   GLUCOSE 82 07/14/2012 1726   BUN 9 09/21/2016 0558   BUN 9 07/14/2012 1726   CREATININE 0.77 09/21/2016 0558   CREATININE 0.80 07/14/2012 1726   CALCIUM 8.7 (L) 09/21/2016 0558   CALCIUM 9.3 07/14/2012 1726   GFRNONAA >60 09/21/2016 0558   GFRNONAA >60 07/14/2012 1726   GFRAA >60 09/21/2016 0558   GFRAA >60 07/14/2012 1726    Assessment/Planning: Osteomyelitis right foot   Dressing changed today.  PT for NWB  Initial culture with Group b strep.   ID following and recommending 4-6 weeks iv abx.  PICC in place.  Will f/u tomorrow.  If continues to improve suspect can be d/c early next week.    Gwyneth RevelsFowler, Jakob Kimberlin A  09/23/2016, 7:45 AM

## 2016-09-24 LAB — CULTURE, BLOOD (ROUTINE X 2)
CULTURE: NO GROWTH
Culture: NO GROWTH

## 2016-09-24 MED ORDER — PIPERACILLIN-TAZOBACTAM 3.375 G IVPB: 3.3750 g | Freq: Three times a day (TID) | INTRAVENOUS | 0 refills | Status: AC

## 2016-09-24 NOTE — Progress Notes (Signed)
Daily Progress Note   Subjective  - 2 Days Post-Op  F/u right foot I & D.  Doing well.  No complaints.  Objective Vitals:   09/23/16 1218 09/23/16 2051 09/24/16 0515 09/24/16 0735  BP: 122/64 137/61 118/79 110/81  Pulse: 76 86 79 67  Resp: 18 18 19 20   Temp: 98.4 F (36.9 C) 98.1 F (36.7 C) 98.3 F (36.8 C) 98.2 F (36.8 C)  TempSrc: Oral Oral Oral Oral  SpO2: 96% 100% 97% 100%  Weight:      Height:        Physical Exam: Wound progressing well.  No purulence. Scant bloody drainage.    Laboratory CBC    Component Value Date/Time   WBC 9.6 09/23/2016 0424   HGB 12.0 (L) 09/23/2016 0424   HGB 12.1 (L) 07/14/2012 1726   HCT 33.1 (L) 09/23/2016 0424   HCT 35.9 (L) 07/14/2012 1726   PLT 359 09/23/2016 0424   PLT 310 07/14/2012 1726    BMET    Component Value Date/Time   NA 138 09/21/2016 0558   NA 140 07/14/2012 1726   K 3.7 09/21/2016 0558   K 3.8 07/14/2012 1726   CL 105 09/21/2016 0558   CL 106 07/14/2012 1726   CO2 26 09/21/2016 0558   CO2 26 07/14/2012 1726   GLUCOSE 109 (H) 09/21/2016 0558   GLUCOSE 82 07/14/2012 1726   BUN 9 09/21/2016 0558   BUN 9 07/14/2012 1726   CREATININE 0.77 09/21/2016 0558   CREATININE 0.80 07/14/2012 1726   CALCIUM 8.7 (L) 09/21/2016 0558   CALCIUM 9.3 07/14/2012 1726   GFRNONAA >60 09/21/2016 0558   GFRNONAA >60 07/14/2012 1726   GFRAA >60 09/21/2016 0558   GFRAA >60 07/14/2012 1726    Assessment/Planning: Doing well.   From podiatry standpoint ok to d/c.  IV abx per ID  Dressings.  Change 3 x's per week.  Pack plantar wound with iodoform packing and apply bulky dressing.  When drainage has discontinued(~1 week)  can apply dry dressing and discontinue packing.  F/u with me in 2 weeks.  NWB to right lower extremity till further instructions received.    Gwyneth RevelsFowler, Kaylin Schellenberg A  09/24/2016, 10:31 AM

## 2016-09-24 NOTE — Care Management Note (Signed)
Case Management Note  Patient Details  Name: [name] [name] MRN: [id number] Date of Birth: [birth year]  Subjective/Objective:      Discussed discharge planning with [name] [name] [name] [name] reports that he is employed and has [employer] insurance. Copy of the BC[employer] card was placed in [name] [name]'s chart. [name] referral for home health RN,PT and IV Zosyn to begin tomorrow was faxed to [facility]. The CM on Monday will contact  [facility] to arrange [name] time for the IV Zosyn and the HH-RN to arrive at [name] [name]'s home on Monday 09/25/16. All IV ABX orders and HH-RN and  PT orders, as well as [name] copy of [name] [name]'s BCBS card, were faxed to [facility] on 09/24/16. Dr Ether GriffinsFowler was the surgeon and will answer all home health questions and can be contacted for orders. [name] [name] has [name] PICC line in his right arm.               Action/Plan:   Expected Discharge Date:                  Expected Discharge Plan:     In-House Referral:     Discharge planning Services     Post Acute Care Choice:    Choice offered to:     DME Arranged:    DME Agency:     HH Arranged:    HH Agency:     Status of Service:     If discussed at [employer] Length of Stay Meetings, dates discussed:    Additional Comments:  [name] Espina A, RN 09/24/2016, 2:13 PM

## 2016-09-24 NOTE — Progress Notes (Signed)
[facility] - [place] at [facility]   PATIENT NAME: [name] [name]    MR#:  [id number]  DATE OF BIRTH:  [birth year]  SUBJECTIVE:    No issues overnight   REVIEW OF SYSTEMS:    Review of Systems  Constitutional: Negative.  Negative for chills, fever and malaise/fatigue.  HENT: Negative.  Negative for ear discharge, ear pain, hearing loss, nosebleeds and sore throat.   Eyes: Negative.  Negative for blurred vision and pain.  Respiratory: Negative.  Negative for cough, hemoptysis, shortness of breath and wheezing.   Cardiovascular: Negative.  Negative for chest pain, palpitations and leg swelling.  Gastrointestinal: Negative.  Negative for abdominal pain, blood in stool, diarrhea, nausea and vomiting.  Genitourinary: Negative.  Negative for dysuria.  Musculoskeletal: Negative.  Negative for back pain.  Skin: Negative.        Covered right foot  Neurological: Negative for dizziness, tremors, speech change, focal weakness, seizures and headaches.  Endo/Heme/Allergies: Negative.  Does not bruise/bleed easily.  Psychiatric/Behavioral: Negative.  Negative for depression, hallucinations and suicidal ideas.    Tolerating Diet: yes     DRUG ALLERGIES:  No Known Allergies  VITALS:  Blood pressure (!) 113/59, pulse 68, temperature 97.7 F (36.5 C), temperature source Oral, resp. rate 20, height 6\' 4"  (1.93 m), weight 108.9 kg (240 lb), SpO2 100 %.  PHYSICAL EXAMINATION:   Physical Exam  Constitutional: He is oriented to person, place, and time and well-developed, well-nourished, and in no distress. No distress.  HENT:  Head: Normocephalic.  Eyes: No scleral icterus.  Neck: Normal range of motion. Neck supple. No JVD present. No tracheal deviation present.  Cardiovascular: Normal rate, regular rhythm and normal heart sounds.  Exam reveals no gallop and no friction rub.   No murmur heard. Pulmonary/Chest: Effort normal and breath sounds normal. No respiratory  distress. He has no wheezes. He has no rales. He exhibits no tenderness.  Abdominal: Soft. Bowel sounds are normal. He exhibits no distension and no mass. There is no tenderness. There is no rebound and no guarding.  Musculoskeletal: Normal range of motion. He exhibits no edema.  Neurological: He is alert and oriented to person, place, and time.  Skin: Skin is warm. No rash noted. No erythema.  PICC line right arm  Right foot covered Left big toe amputated  Psychiatric: Affect and judgment normal.      LABORATORY PANEL:   CBC  Recent Labs Lab 09/23/16 0424  WBC 9.6  HGB 12.0*  HCT 33.1*  PLT 359   ------------------------------------------------------------------------------------------------------------------  Chemistries   Recent Labs Lab 09/19/16 0814  09/21/16 0558  NA 137  < > 138  K 3.8  < > 3.7  CL 102  < > 105  CO2 26  < > 26  GLUCOSE 110*  < > 109*  BUN 6  < > 9  CREATININE 0.71  < > 0.77  CALCIUM 9.2  < > 8.7*  AST 14*  --   --   ALT 10*  --   --   ALKPHOS 70  --   --   BILITOT 1.0  --   --   < > = values in this interval not displayed. ------------------------------------------------------------------------------------------------------------------  Cardiac Enzymes No results for input(s): TROPONINI in the last 168 hours. ------------------------------------------------------------------------------------------------------------------  RADIOLOGY:  No results found.   ASSESSMENT AND PLAN:   56 year old male with history of tobacco dependence who presents with a right foot pain.   1. Right second toe osteomyelitis  involving right second metatarsal and proximal phalanx:  POD#2  Partial 2nd ray amputation right foot  and Percutaneous tendo-achilles lengthening.right lower leg Wound culture group B strep ID recommending PICC line and long-term (4-6 Weeks)  IV antibiotics Zosyn   As per case management IV antibiotics will be set up for  tomorrow. Home health care order and prescription for Zosyn written.  2. EtOH abuse: Uneventful detoxification   3. Anemia of chronic disease: Hemoglobin stable  4. Tobacco dependence: Continue nicotine patch    Management plans discussed with the patient and he is in agreement.  CODE STATUS: full  TOTAL TIME TAKING CARE OF THIS PATIENT: 22 minutes.     POSSIBLE D/C Monday, DEPENDING ON CLINICAL CONDITION.   Zaydee Aina M.D on 09/24/2016 at 12:31 PM  Between 7am to 6pm - Pager - [phone] After 6pm go to [email or web address] - Social research officer, governmentpassword EPAS [facility]  [facility] [place] [facility]  Office  [phone]  CC: Primary care physician; No PCP Per Patient  Note: This dictation was prepared with Dragon dictation along with smaller phrase technology. Any transcriptional errors that result from this process are unintentional.

## 2016-09-25 ENCOUNTER — Encounter: Payer: Self-pay | Admitting: Podiatry

## 2016-09-25 ENCOUNTER — Encounter: Payer: Self-pay | Admitting: Infectious Diseases

## 2016-09-25 NOTE — Progress Notes (Signed)
Per Care Manager, Okay to discharge pt home and pt will receive next dose of IV Zosyn tonight from home health RN.

## 2016-09-25 NOTE — Progress Notes (Signed)
IV was removed. Right PICC line is still in place. Discharge instructions and follow-up appointments were provided to the pt. All questions answered. Advance set to visit pt tonight to initiate abx at home. The pt was taken downstairs via wheelchair by NT.

## 2016-09-25 NOTE — Discharge Instructions (Signed)
Resume diet and activity as before ° ° °

## 2016-09-25 NOTE — Progress Notes (Signed)
Infectious Disease Long Term IV Antibiotic Orders  Diagnosis: Foot osteomyelitis  Culture results Results for orders placed or performed during the hospital encounter of 09/19/16  Wound or Superficial Culture     Status: None   Collection Time: 09/19/16  9:57 AM  Result Value Ref Range Status   Specimen Description WOUND  Final   Special Requests NONE  Final   Gram Stain   Final    RARE WBC PRESENT, PREDOMINANTLY MONONUCLEAR MODERATE GRAM POSITIVE RODS RARE GRAM POSITIVE COCCI IN PAIRS IN SINGLES RARE GRAM NEGATIVE RODS    Culture   Final    MODERATE GROUP B STREP(S.AGALACTIAE)ISOLATED TESTING AGAINST S. AGALACTIAE NOT ROUTINELY PERFORMED DUE TO PREDICTABILITY OF AMP/PEN/VAN SUSCEPTIBILITY. Performed at [facility], [place] [address] [place], [place] [address]    Report Status 09/23/2016 FINAL  Final  Blood culture (routine x 2)     Status: None   Collection Time: 09/19/16  9:58 AM  Result Value Ref Range Status   Specimen Description BLOOD R AC  Final   Special Requests   Final    BOTTLES DRAWN AEROBIC AND ANAEROBIC AER 8ML ANA 12ML   Culture NO GROWTH 5 DAYS  Final   Report Status 09/24/2016 FINAL  Final  Blood culture (routine x 2)     Status: None   Collection Time: 09/19/16  9:58 AM  Result Value Ref Range Status   Specimen Description BLOOD L FA  Final   Special Requests   Final    BOTTLES DRAWN AEROBIC AND ANAEROBIC AER 10ML ANA 11ML   Culture NO GROWTH 5 DAYS  Final   Report Status 09/24/2016 FINAL  Final  Surgical PCR screen     Status: None   Collection Time: 09/21/16 10:17 PM  Result Value Ref Range Status   MRSA, PCR NEGATIVE NEGATIVE Final   Staphylococcus aureus NEGATIVE NEGATIVE Final    Comment:        The Xpert SA Assay (FDA approved for NASAL specimens in patients over 51 years of age), is one component of a comprehensive surveillance program.  Test performance has been validated by [facility] for patients greater than or equal to 74 year  old. It is not intended to diagnose infection nor to guide or monitor treatment.   Aerobic/Anaerobic Culture (surgical/deep wound)     Status: None (Preliminary result)   Collection Time: 09/22/16  2:35 PM  Result Value Ref Range Status   Specimen Description WOUND RIGHT TOE  Final   Special Requests NONE  Final   Gram Stain   Final    RARE SQUAMOUS EPITHELIAL CELLS PRESENT MODERATE WBC PRESENT,BOTH PMN AND MONONUCLEAR NO ORGANISMS SEEN    Culture   Final    CULTURE REINCUBATED FOR BETTER GROWTH NO ANAEROBES ISOLATED; CULTURE IN PROGRESS FOR 5 DAYS    Report Status PENDING  Incomplete   Lab Results  Component Value Date   ESRSEDRATE 52 (H) 09/23/2016   Lab Results  Component Value Date   CRP 1.4 (H) 09/23/2016     Allergies: No Known Allergies  Discharge antibiotics  Zosyn  3.375 grams every 8 hours   PICC Care per protocol Labs weekly while on IV antibiotics      CBC w diff   Comprehensive met panel CRP  ESR  Planned duration of antibiotics 4 weeks   Stop date Feb 23  Follow up clinic date1-2 weeks  FAX weekly labs to [phone]  Leonel Ramsay, MD

## 2016-09-25 NOTE — Care Management (Addendum)
Discharge to home today per Dr. Elpidio AnisSudini. [facility] will be provided Zosyn IV in the home.   Got Zosyn at 0553 this morning, Discussed time frames with Dr. Elpidio AnisSudini. [name] will be discharged today and the Nurse from [facility] will be out at the home at 6:00pm this afternoon to administer another dose of Zosyn.  Information about doctors accepting new patients given to [name] Encouraged to start calling these physicians. Friends/family will transport.                                                                                                                                    [name] GreetBrenda S Suellyn Meenan RN MSN CCM Care Management.

## 2016-09-26 LAB — SURGICAL PATHOLOGY

## 2016-09-26 NOTE — Discharge Summary (Signed)
[facility] [place] [facility]   PATIENT NAME: [name] [name]    MR#:  [id number]  DATE OF BIRTH:  [birth year]  DATE OF ADMISSION:  09/19/2016 ADMITTING PHYSICIAN: Shaune Pollack, MD  DATE OF DISCHARGE: 09/25/2016 12:46 PM  PRIMARY CARE PHYSICIAN: No PCP Per Patient   ADMISSION DIAGNOSIS:  Osteomyelitis, unspecified site, unspecified type (HCC) [M86.9]  DISCHARGE DIAGNOSIS:  Active Problems:   Acute osteomyelitis of toe of right foot (HCC)   SECONDARY DIAGNOSIS:   Past Medical History:  Diagnosis Date  . Anemia of chronic disease   . Tobacco use disorder   . Toe gangrene (HCC)      ADMITTING HISTORY  Right foot toe pain worsening for 5 days. HISTORY OF PRESENT ILLNESS:  [name] [name]  is a 56 y.o. male with a known history of Left great toe amputation, foot infection, anemia and smoking. Patient has a right second toe pain and swelling for about 1 months, which has been worsening progressively. He was seen in ED 1 months ago for foot ulcer at the bottom of his right foot. He was given antibiotics but he didn't take antibiotics. He denies any fever or chills, denies any other symptoms. X-ray show right second and the fourth toe osteomyelitis.   HOSPITAL COURSE:   1. Right second toe osteomyelitis involving right second metatarsal and proximal phalanx:   Partial 2nd ray amputation right foot and Percutaneous tendo-achilles lengthening.right lower leg Wound culture group B strep ID recommending PICC line and long-term (4 Weeks)  IV antibiotics Zosyn . Set up through home health by case management.  2. EtOH abuse: Uneventful detoxification   3. Anemia of chronic disease: Hemoglobin stable  Stable for discharge home.  CONSULTS OBTAINED:  Treatment Team:  Recardo Evangelist, DPM Mick Sell, MD  DRUG ALLERGIES:  No Known Allergies  DISCHARGE MEDICATIONS:   Discharge Medication List as of 09/25/2016 11:36 AM    START taking these medications   Details  piperacillin-tazobactam (ZOSYN) 3.375 GM/50ML IVPB Inject 50 mLs (3.375 g total) into the vein every 8 (eight) hours., Starting Sun 09/24/2016, Until Wed 10/25/2016, Print      CONTINUE these medications which have NOT CHANGED   Details  nicotine (NICODERM CQ - DOSED IN MG/24 HOURS) 14 mg/24hr patch Place 1 patch (14 mg total) onto the skin daily., Starting Wed 09/08/2015, Normal    polyethylene glycol (MIRALAX / GLYCOLAX) packet Take 17 g by mouth daily as needed for mild constipation., Starting Wed 09/08/2015, Normal      STOP taking these medications     docusate sodium (COLACE) 100 MG capsule      HYDROcodone-acetaminophen (NORCO/VICODIN) 5-325 MG tablet      naproxen (EC NAPROSYN) 500 MG EC tablet      nicotine polacrilex (NICORETTE) 2 MG gum      oxyCODONE-acetaminophen (PERCOCET) 7.5-325 MG tablet         Today   VITAL SIGNS:  Blood pressure 125/72, pulse 75, temperature 98 F (36.7 C), temperature source Oral, resp. rate 19, height 6\' 4"  (1.93 m), weight 108.9 kg (240 lb), SpO2 96 %.  I/O:  No intake or output data in the 24 hours ending 09/26/16 1635  PHYSICAL EXAMINATION:  Physical Exam  GENERAL:  56 y.o.-year-old patient lying in the bed with no acute distress.  LUNGS: Normal breath sounds bilaterally, no wheezing, rales,rhonchi or crepitation. No use of accessory muscles of respiration.  CARDIOVASCULAR: S1, S2 normal. No murmurs, rubs, or gallops.  ABDOMEN:  Soft, non-tender, non-distended. Bowel sounds present. No organomegaly or mass.  NEUROLOGIC: Moves all 4 extremities. PSYCHIATRIC: The patient is alert and oriented x 3.  Right foot dressing  DATA REVIEW:   CBC  Recent Labs Lab 09/23/16 0424  WBC 9.6  HGB 12.0*  HCT 33.1*  PLT 359    Chemistries   Recent Labs Lab 09/21/16 0558  NA 138  K 3.7  CL 105  CO2 26  GLUCOSE 109*  BUN 9  CREATININE 0.77  CALCIUM 8.7*    Cardiac Enzymes No results for input(s): TROPONINI in the last  168 hours.  Microbiology Results  Results for orders placed or performed during the hospital encounter of 09/19/16  Wound or Superficial Culture     Status: None   Collection Time: 09/19/16  9:57 AM  Result Value Ref Range Status   Specimen Description WOUND  Final   Special Requests NONE  Final   Gram Stain   Final    RARE WBC PRESENT, PREDOMINANTLY MONONUCLEAR MODERATE GRAM POSITIVE RODS RARE GRAM POSITIVE COCCI IN PAIRS IN SINGLES RARE GRAM NEGATIVE RODS    Culture   Final    MODERATE GROUP B STREP(S.AGALACTIAE)ISOLATED TESTING AGAINST S. AGALACTIAE NOT ROUTINELY PERFORMED DUE TO PREDICTABILITY OF AMP/PEN/VAN SUSCEPTIBILITY. Performed at [facility], [address] [place], [place] [address]    Report Status 09/23/2016 FINAL  Final  Blood culture (routine x 2)     Status: None   Collection Time: 09/19/16  9:58 AM  Result Value Ref Range Status   Specimen Description BLOOD R AC  Final   Special Requests   Final    BOTTLES DRAWN AEROBIC AND ANAEROBIC AER 8ML ANA 12ML   Culture NO GROWTH 5 DAYS  Final   Report Status 09/24/2016 FINAL  Final  Blood culture (routine x 2)     Status: None   Collection Time: 09/19/16  9:58 AM  Result Value Ref Range Status   Specimen Description BLOOD L FA  Final   Special Requests   Final    BOTTLES DRAWN AEROBIC AND ANAEROBIC AER 10ML ANA 11ML   Culture NO GROWTH 5 DAYS  Final   Report Status 09/24/2016 FINAL  Final  Surgical PCR screen     Status: None   Collection Time: 09/21/16 10:17 PM  Result Value Ref Range Status   MRSA, PCR NEGATIVE NEGATIVE Final   Staphylococcus aureus NEGATIVE NEGATIVE Final    Comment:        The Xpert SA Assay (FDA approved for NASAL specimens in patients over 58 years of age), is one component of a comprehensive surveillance program.  Test performance has been validated by [facility] for patients greater than or equal to 58 year old. It is not intended to diagnose infection nor to guide or  monitor treatment.   Aerobic/Anaerobic Culture (surgical/deep wound)     Status: None (Preliminary result)   Collection Time: 09/22/16  2:35 PM  Result Value Ref Range Status   Specimen Description WOUND RIGHT TOE  Final   Special Requests NONE  Final   Gram Stain   Final    RARE SQUAMOUS EPITHELIAL CELLS PRESENT MODERATE WBC PRESENT,BOTH PMN AND MONONUCLEAR NO ORGANISMS SEEN    Culture   Final    NORMAL SKIN FLORA GROWING AEROBICALLY NO ANAEROBES ISOLATED; CULTURE IN PROGRESS FOR 5 DAYS    Report Status PENDING  Incomplete    RADIOLOGY:  No results found.  Follow up with PCP in  1 week.  Management plans discussed with the patient, family and they are in agreement.  CODE STATUS:  Code Status History    Date Active Date Inactive Code Status Order ID Comments User Context   09/19/2016 11:22 AM 09/25/2016  3:47 PM Full Code [id number]  Shaune Pollack, MD Inpatient   09/03/2015  1:40 PM 09/09/2015  6:03 PM Full Code [id number]  Enid Baas, MD Inpatient   07/11/2015  2:44 PM 07/14/2015  9:59 PM Full Code [id number]  Marguarite Arbour, MD Inpatient      TOTAL TIME TAKING CARE OF THIS PATIENT ON DAY OF DISCHARGE: more than 30 minutes.   Milagros Loll R M.D on 09/26/2016 at 4:35 PM  Between 7am to 6pm - Pager - [phone]  After 6pm go to [email or web address] - password [email or web address] [facility]  [facility]  Office  [phone]  CC: Primary care physician; No PCP Per Patient  Note: This dictation was prepared with Dragon dictation along with smaller phrase technology. Any transcriptional errors that result from this process are unintentional.

## 2016-09-29 LAB — AEROBIC/ANAEROBIC CULTURE W GRAM STAIN (SURGICAL/DEEP WOUND): Culture: NORMAL

## 2017-07-06 ENCOUNTER — Emergency Department: Payer: Self-pay

## 2017-07-06 ENCOUNTER — Other Ambulatory Visit: Payer: Self-pay

## 2017-07-06 ENCOUNTER — Encounter: Payer: Self-pay | Admitting: Emergency Medicine

## 2017-07-06 ENCOUNTER — Emergency Department
Admission: EM | Admit: 2017-07-06 | Discharge: 2017-07-06 | Disposition: A | Discharge status: Home / Self Care | Payer: Self-pay | Attending: Emergency Medicine | Admitting: Emergency Medicine

## 2017-07-06 DIAGNOSIS — Z79899 Other long term (current) drug therapy: Secondary | ICD-10-CM | POA: Insufficient documentation

## 2017-07-06 DIAGNOSIS — F17210 Nicotine dependence, cigarettes, uncomplicated: Secondary | ICD-10-CM | POA: Insufficient documentation

## 2017-07-06 DIAGNOSIS — L03032 Cellulitis of left toe: Secondary | ICD-10-CM | POA: Insufficient documentation

## 2017-07-06 DIAGNOSIS — E119 Type 2 diabetes mellitus without complications: Secondary | ICD-10-CM | POA: Insufficient documentation

## 2017-07-06 HISTORY — DX: Type 2 diabetes mellitus without complications: E11.9

## 2017-07-06 LAB — CBC
HCT: 34.1 % — ABNORMAL LOW (ref 40.0–52.0)
Hemoglobin: 11.5 g/dL — ABNORMAL LOW (ref 13.0–18.0)
MCH: 34 pg (ref 26.0–34.0)
MCHC: 33.7 g/dL (ref 32.0–36.0)
MCV: 101 fL — AB (ref 80.0–100.0)
PLATELETS: 453 10*3/uL — AB (ref 150–440)
RBC: 3.37 MIL/uL — AB (ref 4.40–5.90)
RDW: 12.6 % (ref 11.5–14.5)
WBC: 7.8 10*3/uL (ref 3.8–10.6)

## 2017-07-06 LAB — BASIC METABOLIC PANEL
ANION GAP: 11 (ref 5–15)
BUN: <5 mg/dL — ABNORMAL LOW (ref 6–20)
CALCIUM: 8.9 mg/dL (ref 8.9–10.3)
CO2: 22 mmol/L (ref 22–32)
Chloride: 106 mmol/L (ref 101–111)
Creatinine, Ser: 0.62 mg/dL (ref 0.61–1.24)
GLUCOSE: 143 mg/dL — AB (ref 65–99)
POTASSIUM: 3.2 mmol/L — AB (ref 3.5–5.1)
Sodium: 139 mmol/L (ref 135–145)

## 2017-07-06 MED ORDER — SULFAMETHOXAZOLE-TRIMETHOPRIM 800-160 MG PO TABS
1.0000 | ORAL_TABLET | Freq: Once | ORAL | Status: AC
  Administered 2017-07-06: 1 via ORAL
  Filled 2017-07-06: qty 1

## 2017-07-06 MED ORDER — SULFAMETHOXAZOLE-TRIMETHOPRIM 800-160 MG PO TABS: 1.0000 | ORAL_TABLET | Freq: Two times a day (BID) | ORAL | 0 refills | Status: DC

## 2017-07-06 NOTE — ED Triage Notes (Signed)
Pt here for possible infection to left toes.  Has hx gangrene with amputation. Is diabetic.  Has not been checking sugar. Foul odor with mild purulent drainage and swelling noted to left foot.  VSS without sx SIRS/sepsis.

## 2017-07-06 NOTE — ED Provider Notes (Signed)
[facility] Emergency Department Provider Note  Time seen: 8:05 AM  I have reviewed the triage vital signs and the nursing notes.   HISTORY  Chief Complaint Wound Infection    HPI [name] [name] is a 56 y.o. male with a past medical history of anemia, diabetes, toe gangrene status post amputations who presents to the emergency department for left toe swelling and pain.  According to the patient for the past 3 weeks he has had swelling and pain in his left second toe.  Patient states for the past 2 weeks he has noticed drainage from the toe, but states he is "hard headed" and did not get it checked out until today.  Denies any fever.  Denies vomiting.  Past Medical History:  Diagnosis Date  . Anemia of chronic disease   . Diabetes mellitus without complication (HCC)   . Tobacco use disorder   . Toe gangrene [facility](HCC)     Patient Active Problem List   Diagnosis Date Noted  . Acute osteomyelitis of toe of right foot (HCC) 09/19/2016  . 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 Anemia 09/08/2015  . Cocaine abuse (HCC) 09/08/2015  . Osteomyelitis (HCC) 09/03/2015  . Hypokalemia 07/14/2015  . Leukocytosis 07/14/2015  . Anemia of chronic disease 07/14/2015  . Tobacco abuse 07/14/2015  . SIRS (systemic inflammatory response syndrome) (HCC) 07/11/2015  . 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    History reviewed. No pertinent surgical history.  Prior to Admission medications   Medication Sig Start Date End Date Taking? Authorizing Provider  nicotine (NICODERM CQ - DOSED IN MG/24 HOURS) 14 mg/24hr patch Place 1 patch (14 mg total) onto the skin daily. 09/08/15   Katharina CaperVaickute, Rima, MD  polyethylene glycol (MIRALAX / GLYCOLAX) packet Take 17 g by mouth daily as needed for mild constipation. 09/08/15   Katharina CaperVaickute, Rima, MD    No Known Allergies  Family History  Problem Relation Age of Onset  . CVA Mother   . CVA Brother   . Diabetes Mellitus II Father     Social History Social  History   Tobacco Use  . Smoking status: Current Every Day Smoker    Packs/day: 0.50    Types: Cigarettes  . Smokeless tobacco: Never Used  Substance Use Topics  . Alcohol use: Yes    Alcohol/week: 2.4 oz    Types: 4 Cans of beer per week    Comment: daily  . Drug use: Yes    Comment: cocaine occasionally    Review of Systems Constitutional: Negative for fever. Cardiovascular: Negative for chest pain. Respiratory: Negative for shortness of breath. Gastrointestinal: Negative for abdominal pain, vomiting  Musculoskeletal: Swelling and discharge of the left second toe Skin: Swelling and discharge of the left fourth toe All other ROS negative  ____________________________________________   PHYSICAL EXAM:  VITAL SIGNS: ED Triage Vitals  Enc Vitals Group     BP 07/06/17 0749 138/68     Pulse Rate 07/06/17 0749 94     Resp 07/06/17 0749 18     Temp 07/06/17 0749 98.2 F (36.8 C)     Temp Source 07/06/17 0749 Oral     SpO2 07/06/17 0749 99 %     Weight 07/06/17 0748 240 lb (108.9 kg)     Height 07/06/17 0748 6\' 4"  (1.93 m)     Head Circumference --      Peak Flow --      Pain Score 07/06/17 0747 8     Pain Loc --  Pain Edu? --      Excl. in GC? --    Constitutional: Alert. Well appearing and in no distress. Eyes: Normal exam ENT   Head: Normocephalic and atraumatic.   Mouth/Throat: Mucous membranes are moist. Cardiovascular: Normal rate, regular rhythm. No murmur Respiratory: Normal respiratory effort without tachypnea nor retractions. Breath sounds are clear Gastrointestinal: Soft and nontender. No distention. Musculoskeletal: Patient has prior amputation of the left great toe, patient has significant swelling of the second toe with an ulceration to the plantar aspect of the base of the second toe with mild drainage. Neurologic:  Normal speech and language. No gross focal neurologic deficits.  Patient states sensation in his foot and toes during  examination. Skin:  Skin is warm, ulceration to base of left fourth toe as described above. Psychiatric: Mood and affect are normal.  ____________________________________________     RADIOLOGY  X-ray shows second MTP joint dislocation.  No significant osteomyelitis.  Degenerative changes at Lisfranc joints.  ____________________________________________   INITIAL IMPRESSION / ASSESSMENT AND PLAN / ED COURSE  Pertinent labs & imaging results that were available during my care of the patient were reviewed by me and considered in my medical decision making (see chart for details).  Patient presents to the emergency department for a swollen and draining left second toe.  Differential would include diabetic ulcer, cellulitis, osteomyelitis.  We will check labs, x-ray to help evaluate for osteomyelitis.  Reassuringly patient's vitals are normal, temperature is normal.  X-ray appears to be negative for osteomyelitis, second MTP joint dislocation.  I again examined the foot after the x-ray was reviewed patient has significant swelling to the plantar aspect of the toe I suspect the dislocation was secondary to the edema/swelling.  Attempted reduction does not appear to be able to be reduced, I suspect the dislocation is chronic.  No pain elicited with attempted reduction.  We will have the patient follow-up with podiatry for further evaluation.  We will place the patient on Bactrim twice daily for the next 2 weeks.  I discussed return precautions with the patient and he is agreeable to this plan.  ____________________________________________   FINAL CLINICAL IMPRESSION(S) / ED DIAGNOSES  Left second toe infection Cellulitis   Minna AntisPaduchowski, Atiana Levier, MD 07/06/17 [phone]

## 2017-07-06 NOTE — Discharge Instructions (Signed)
Please call the number provided for podiatry to make the next available appointment.  Please take your antibiotics as prescribed.  Return to the emergency department for any worsening pain, any fever, worsening appearing toe, or any other symptom personally concerning to yourself.

## 2021-01-11 ENCOUNTER — Inpatient Hospital Stay
Admission: EM | Admit: 2021-01-11 | Discharge: 2021-01-17 | DRG: 617 | Disposition: A | Discharge status: Home / Self Care | Payer: Self-pay | Attending: Internal Medicine | Admitting: Internal Medicine

## 2021-01-11 ENCOUNTER — Emergency Department: Payer: Self-pay

## 2021-01-11 ENCOUNTER — Other Ambulatory Visit: Payer: Self-pay

## 2021-01-11 DIAGNOSIS — M86172 Other acute osteomyelitis, left ankle and foot: Secondary | ICD-10-CM | POA: Diagnosis present

## 2021-01-11 DIAGNOSIS — Z20822 Contact with and (suspected) exposure to covid-19: Secondary | ICD-10-CM | POA: Diagnosis present

## 2021-01-11 DIAGNOSIS — L97509 Non-pressure chronic ulcer of other part of unspecified foot with unspecified severity: Secondary | ICD-10-CM

## 2021-01-11 DIAGNOSIS — F17210 Nicotine dependence, cigarettes, uncomplicated: Secondary | ICD-10-CM | POA: Diagnosis present

## 2021-01-11 DIAGNOSIS — E11610 Type 2 diabetes mellitus with diabetic neuropathic arthropathy: Secondary | ICD-10-CM | POA: Diagnosis present

## 2021-01-11 DIAGNOSIS — L089 Local infection of the skin and subcutaneous tissue, unspecified: Secondary | ICD-10-CM

## 2021-01-11 DIAGNOSIS — E860 Dehydration: Secondary | ICD-10-CM | POA: Diagnosis present

## 2021-01-11 DIAGNOSIS — E876 Hypokalemia: Secondary | ICD-10-CM | POA: Diagnosis present

## 2021-01-11 DIAGNOSIS — M009 Pyogenic arthritis, unspecified: Secondary | ICD-10-CM | POA: Diagnosis present

## 2021-01-11 DIAGNOSIS — D638 Anemia in other chronic diseases classified elsewhere: Secondary | ICD-10-CM | POA: Diagnosis present

## 2021-01-11 DIAGNOSIS — E1151 Type 2 diabetes mellitus with diabetic peripheral angiopathy without gangrene: Secondary | ICD-10-CM | POA: Diagnosis present

## 2021-01-11 DIAGNOSIS — Z79899 Other long term (current) drug therapy: Secondary | ICD-10-CM

## 2021-01-11 DIAGNOSIS — I739 Peripheral vascular disease, unspecified: Secondary | ICD-10-CM

## 2021-01-11 DIAGNOSIS — Z9889 Other specified postprocedural states: Secondary | ICD-10-CM

## 2021-01-11 DIAGNOSIS — F17200 Nicotine dependence, unspecified, uncomplicated: Secondary | ICD-10-CM | POA: Diagnosis present

## 2021-01-11 DIAGNOSIS — L97529 Non-pressure chronic ulcer of other part of left foot with unspecified severity: Secondary | ICD-10-CM | POA: Diagnosis present

## 2021-01-11 DIAGNOSIS — E872 Acidosis: Secondary | ICD-10-CM | POA: Diagnosis present

## 2021-01-11 DIAGNOSIS — E11621 Type 2 diabetes mellitus with foot ulcer: Secondary | ICD-10-CM | POA: Diagnosis present

## 2021-01-11 DIAGNOSIS — T8743 Infection of amputation stump, right lower extremity: Secondary | ICD-10-CM | POA: Diagnosis present

## 2021-01-11 DIAGNOSIS — Z89421 Acquired absence of other right toe(s): Secondary | ICD-10-CM

## 2021-01-11 DIAGNOSIS — M86171 Other acute osteomyelitis, right ankle and foot: Secondary | ICD-10-CM | POA: Diagnosis present

## 2021-01-11 DIAGNOSIS — L97519 Non-pressure chronic ulcer of other part of right foot with unspecified severity: Secondary | ICD-10-CM | POA: Diagnosis present

## 2021-01-11 DIAGNOSIS — Z833 Family history of diabetes mellitus: Secondary | ICD-10-CM

## 2021-01-11 DIAGNOSIS — M25474 Effusion, right foot: Secondary | ICD-10-CM | POA: Diagnosis present

## 2021-01-11 DIAGNOSIS — E1140 Type 2 diabetes mellitus with diabetic neuropathy, unspecified: Secondary | ICD-10-CM | POA: Diagnosis present

## 2021-01-11 DIAGNOSIS — Z89422 Acquired absence of other left toe(s): Secondary | ICD-10-CM

## 2021-01-11 DIAGNOSIS — E11628 Type 2 diabetes mellitus with other skin complications: Secondary | ICD-10-CM

## 2021-01-11 DIAGNOSIS — E1169 Type 2 diabetes mellitus with other specified complication: Principal | ICD-10-CM | POA: Diagnosis present

## 2021-01-11 DIAGNOSIS — L97502 Non-pressure chronic ulcer of other part of unspecified foot with fat layer exposed: Secondary | ICD-10-CM

## 2021-01-11 DIAGNOSIS — M6250 Muscle wasting and atrophy, not elsewhere classified, unspecified site: Secondary | ICD-10-CM | POA: Diagnosis present

## 2021-01-11 LAB — C-REACTIVE PROTEIN: CRP: 19.4 mg/dL — ABNORMAL HIGH (ref <1.0)

## 2021-01-11 LAB — COMPREHENSIVE METABOLIC PANEL
ALT: 12 U/L (ref 0–44)
AST: 18 U/L (ref 15–41)
Albumin: 3.6 g/dL (ref 3.5–5.0)
Alkaline Phosphatase: 70 U/L (ref 38–126)
Anion gap: 12 (ref 5–15)
BUN: <5 mg/dL — ABNORMAL LOW (ref 8–23)
CO2: 22 mmol/L (ref 22–32)
Calcium: 8.9 mg/dL (ref 8.9–10.3)
Chloride: 100 mmol/L (ref 98–111)
Creatinine, Ser: 0.63 mg/dL (ref 0.61–1.24)
GFR, Estimated: >60 mL/min (ref >60)
Glucose, Bld: 142 mg/dL — ABNORMAL HIGH (ref 70–99)
Potassium: 3 mmol/L — ABNORMAL LOW (ref 3.5–5.1)
Sodium: 134 mmol/L — ABNORMAL LOW (ref 135–145)
Total Bilirubin: 0.7 mg/dL (ref 0.3–1.2)
Total Protein: 7.7 g/dL (ref 6.5–8.1)

## 2021-01-11 LAB — CBC WITH DIFFERENTIAL/PLATELET
Abs Immature Granulocytes: 0.02 10*3/uL (ref 0.00–0.07)
Basophils Absolute: 0.1 10*3/uL (ref 0.0–0.1)
Basophils Relative: 1 %
Eosinophils Absolute: 0.2 10*3/uL (ref 0.0–0.5)
Eosinophils Relative: 3 %
HCT: 40.7 % (ref 39.0–52.0)
Hemoglobin: 13.9 g/dL (ref 13.0–17.0)
Immature Granulocytes: 0 %
Lymphocytes Relative: 14 %
Lymphs Abs: 1.2 10*3/uL (ref 0.7–4.0)
MCH: 34.6 pg — ABNORMAL HIGH (ref 26.0–34.0)
MCHC: 34.2 g/dL (ref 30.0–36.0)
MCV: 101.2 fL — ABNORMAL HIGH (ref 80.0–100.0)
Monocytes Absolute: 0.8 10*3/uL (ref 0.1–1.0)
Monocytes Relative: 9 %
Neutro Abs: 6.3 10*3/uL (ref 1.7–7.7)
Neutrophils Relative %: 73 %
Platelets: 227 10*3/uL (ref 150–400)
RBC: 4.02 MIL/uL — ABNORMAL LOW (ref 4.22–5.81)
RDW: 11.8 % (ref 11.5–15.5)
WBC: 8.5 10*3/uL (ref 4.0–10.5)
nRBC: 0 % (ref 0.0–0.2)

## 2021-01-11 LAB — URINALYSIS, COMPLETE (UACMP) WITH MICROSCOPIC
Bacteria, UA: NONE SEEN
Bilirubin Urine: NEGATIVE
Glucose, UA: NEGATIVE mg/dL
Hgb urine dipstick: NEGATIVE
Ketones, ur: NEGATIVE mg/dL
Leukocytes,Ua: NEGATIVE
Nitrite: NEGATIVE
Protein, ur: NEGATIVE mg/dL
Specific Gravity, Urine: 1.014 (ref 1.005–1.030)
Squamous Epithelial / HPF: NONE SEEN (ref 0–5)
pH: 5 (ref 5.0–8.0)

## 2021-01-11 LAB — GLUCOSE, CAPILLARY
Glucose-Capillary: 106 mg/dL — ABNORMAL HIGH (ref 70–99)
Glucose-Capillary: 116 mg/dL — ABNORMAL HIGH (ref 70–99)

## 2021-01-11 LAB — LACTIC ACID, PLASMA
Lactic Acid, Venous: 2.6 mmol/L (ref 0.5–1.9)
Lactic Acid, Venous: 2.1 mmol/L (ref 0.5–1.9)

## 2021-01-11 LAB — URINE DRUG SCREEN, QUALITATIVE (ARMC ONLY)
Amphetamines, Ur Screen: NOT DETECTED
Barbiturates, Ur Screen: NOT DETECTED
Benzodiazepine, Ur Scrn: NOT DETECTED
Cannabinoid 50 Ng, Ur ~~LOC~~: NOT DETECTED
Cocaine Metabolite,Ur ~~LOC~~: NOT DETECTED
MDMA (Ecstasy)Ur Screen: NOT DETECTED
Methadone Scn, Ur: NOT DETECTED
Opiate, Ur Screen: NOT DETECTED
Phencyclidine (PCP) Ur S: NOT DETECTED
Tricyclic, Ur Screen: NOT DETECTED

## 2021-01-11 LAB — PREALBUMIN: Prealbumin: 13.1 mg/dL — ABNORMAL LOW (ref 18–38)

## 2021-01-11 LAB — HEMOGLOBIN A1C
Hgb A1c MFr Bld: 5 % (ref 4.8–5.6)
Mean Plasma Glucose: 96.8 mg/dL

## 2021-01-11 LAB — APTT: aPTT: 33 seconds (ref 24–36)

## 2021-01-11 LAB — MAGNESIUM: Magnesium: 2.1 mg/dL (ref 1.7–2.4)

## 2021-01-11 LAB — PROTIME-INR
INR: 1 (ref 0.8–1.2)
Prothrombin Time: 13.3 seconds (ref 11.4–15.2)

## 2021-01-11 LAB — SEDIMENTATION RATE: Sed Rate: 77 mm/hr — ABNORMAL HIGH (ref 0–20)

## 2021-01-11 LAB — HIV ANTIBODY (ROUTINE TESTING W REFLEX): HIV Screen 4th Generation wRfx: NONREACTIVE

## 2021-01-11 MED ORDER — POLYETHYLENE GLYCOL 3350 17 G PO PACK: 17.0000 g | PACK | Freq: Every day | ORAL | Status: DC | PRN

## 2021-01-11 MED ORDER — NICOTINE 14 MG/24HR TD PT24
14.0000 mg | MEDICATED_PATCH | Freq: Every day | TRANSDERMAL | Status: DC
  Administered 2021-01-11 – 2021-01-17 (×7): 14 mg via TRANSDERMAL
  Filled 2021-01-11 (×6): qty 1

## 2021-01-11 MED ORDER — ONDANSETRON HCL 4 MG/2ML IJ SOLN: 4.0000 mg | Freq: Four times a day (QID) | INTRAMUSCULAR | Status: DC | PRN

## 2021-01-11 MED ORDER — ATORVASTATIN CALCIUM 20 MG PO TABS
40.0000 mg | ORAL_TABLET | Freq: Every day | ORAL | Status: DC
  Administered 2021-01-11 – 2021-01-17 (×6): 40 mg via ORAL
  Filled 2021-01-11 (×8): qty 2

## 2021-01-11 MED ORDER — VANCOMYCIN HCL 1250 MG/250ML IV SOLN
1250.0000 mg | Freq: Once | INTRAVENOUS | Status: AC
  Administered 2021-01-11: 1250 mg via INTRAVENOUS
  Filled 2021-01-11: qty 250

## 2021-01-11 MED ORDER — POTASSIUM CHLORIDE CRYS ER 20 MEQ PO TBCR
40.0000 meq | EXTENDED_RELEASE_TABLET | Freq: Two times a day (BID) | ORAL | Status: AC
  Administered 2021-01-11 – 2021-01-12 (×2): 40 meq via ORAL
  Filled 2021-01-11 (×2): qty 2

## 2021-01-11 MED ORDER — SODIUM CHLORIDE 0.9 % IV SOLN
2.0000 g | INTRAVENOUS | Status: DC
  Administered 2021-01-11: 2 g via INTRAVENOUS
  Filled 2021-01-11 (×2): qty 20

## 2021-01-11 MED ORDER — SODIUM CHLORIDE 0.9 % IV SOLN
2.0000 g | Freq: Once | INTRAVENOUS | Status: AC
  Administered 2021-01-11: 2 g via INTRAVENOUS
  Filled 2021-01-11: qty 2

## 2021-01-11 MED ORDER — METRONIDAZOLE 500 MG/100ML IV SOLN
500.0000 mg | Freq: Three times a day (TID) | INTRAVENOUS | Status: DC
  Administered 2021-01-11 – 2021-01-12 (×3): 500 mg via INTRAVENOUS
  Filled 2021-01-11 (×6): qty 100

## 2021-01-11 MED ORDER — TRAMADOL HCL 50 MG PO TABS
50.0000 mg | ORAL_TABLET | Freq: Three times a day (TID) | ORAL | Status: DC | PRN
  Administered 2021-01-14 – 2021-01-16 (×4): 50 mg via ORAL
  Filled 2021-01-11 (×5): qty 1

## 2021-01-11 MED ORDER — INSULIN ASPART 100 UNIT/ML IJ SOLN
0.0000 [IU] | Freq: Three times a day (TID) | INTRAMUSCULAR | Status: DC
  Administered 2021-01-13 – 2021-01-14 (×2): 2 [IU] via SUBCUTANEOUS
  Filled 2021-01-11 (×2): qty 1

## 2021-01-11 MED ORDER — LACTATED RINGERS IV BOLUS
1000.0000 mL | Freq: Once | INTRAVENOUS | Status: AC
  Administered 2021-01-11: 1000 mL via INTRAVENOUS

## 2021-01-11 MED ORDER — ONDANSETRON HCL 4 MG PO TABS: 4.0000 mg | ORAL_TABLET | Freq: Four times a day (QID) | ORAL | Status: DC | PRN

## 2021-01-11 MED ORDER — ASPIRIN EC 81 MG PO TBEC
81.0000 mg | DELAYED_RELEASE_TABLET | Freq: Every day | ORAL | Status: DC
  Administered 2021-01-11 – 2021-01-17 (×6): 81 mg via ORAL
  Filled 2021-01-11 (×7): qty 1

## 2021-01-11 NOTE — H&P (Signed)
History and Physical    [name] [name] ZOX:[id number] DOB: 1961-05-06 DOA: 01/11/2021  PCP: Patient, No Pcp Per (Inactive)   Patient coming from: Home  I have personally briefly reviewed patient's old medical records in [facility]  Chief Complaint: Bilateral lower extremity wounds  HPI: [name] [name] is a 60 y.o. male with medical history significant for diabetes mellitus, nicotine dependence, peripheral vascular disease status post amputation of the left great toe and second toe on the right foot, alcohol dependence who presents to the emergency room for evaluation of wounds to both feet.  He is unable to tell me how long he has had those wounds but is concerned about drainage from the ulcer on the plantar surface of his right foot. He denies having any trauma, no fever, no chills.  He has neuropathy and states that his gait is unsteady but denies any recent falls. He denies having any chest pain, no shortness of breath, no palpitations, no diaphoresis, no nausea, no vomiting, no abdominal pain, no changes in his bowel habits, no dizziness, no lightheadedness, no headache, no blurred vision, no focal deficits. Labs show sodium 134, potassium 3.0, chloride 100, bicarb 22, glucose 142, BUN 5, creatinine 0.63, calcium 8.9, alkaline phosphatase 70, albumin 3.6, AST 18, ALT 12, total protein 7.7, lactic acid 2.6, white count 8.5, hemoglobin 13.9, hematocrit 40.7, MCV 101.2, RDW 11.8, platelet count 227 Respiratory viral panel is pending Chest x-ray reviewed by me shows no acute cardiopulmonary disease Left foot x-ray shows new ulceration at the tip of the third toe with underlying erosion of the third distal phalanx tuft, concerning for osteomyelitis. Plantar soft tissue ulceration at the first amputation stump. Chronic neuropathic arthropathy of the midfoot. Right foot x-ray shows interval partial amputation of the second metatarsal. Lucency in the area of the head of the third metatarsal  and between the head of first and third metatarsal in the soft tissues likely reflects soft tissue ulceration given appearance of this area on the lateral view. No definitive signs of osteomyelitis in this area though there is subluxation and/or dislocation of the tarsal metatarsal joint. Irregularity of the distal aspect of the great toe also suggests ulceration with deformity of the distal phalanx that raises the question of prior osteomyelitis and bone destruction. No current lucency in this area to suggest ongoing changes of active osteomyelitis by plain film evaluation. 12 Lead EKG reviewed by me is normal.  ED Course: Patient is a 60 year old African-American male with a history of diabetes mellitus not on any medication, peripheral vascular disease status post amputation of the left great toe and second digit on the right 2 who presents to the ER for evaluation of bilateral foot ulcers. Imaging is suggestive of possible osteomyelitis. Patient will be admitted to the hospital for IV antibiotic therapy and podiatry consult.  Review of Systems: As per HPI otherwise all other systems reviewed and negative.    Past Medical History:  Diagnosis Date  . Anemia of chronic disease   . Diabetes mellitus without complication (HCC)   . Tobacco use disorder   . Toe gangrene [facility])     Past Surgical History:  Procedure Laterality Date  . ACHILLES TENDON SURGERY Right 09/22/2016   Procedure: ACHILLES TENDON LENGTHENING;  Surgeon: Gwyneth Revels, DPM;  Location: [facility] ORS;  Service: Podiatry;  Laterality: Right;  . AMPUTATION TOE Left 07/12/2015   Procedure: AMPUTATION TOE;  Surgeon: Gwyneth Revels, DPM;  Location: [facility] ORS;  Service: Podiatry;  Laterality: Left;  .  AMPUTATION TOE Right 09/22/2016   Procedure: AMPUTATION TOE;  Surgeon: Gwyneth Revels, DPM;  Location: [facility] ORS;  Service: Podiatry;  Laterality: Right;  . IRRIGATION AND DEBRIDEMENT FOOT Left 07/12/2015   Procedure: IRRIGATION AND  DEBRIDEMENT FOOT;  Surgeon: Gwyneth Revels, DPM;  Location: [facility] ORS;  Service: Podiatry;  Laterality: Left;  . IRRIGATION AND DEBRIDEMENT FOOT Left 09/06/2015   Procedure: IRRIGATION AND DEBRIDEMENT FOOT;  Surgeon: Recardo Evangelist, DPM;  Location: [facility] ORS;  Service: Podiatry;  Laterality: Left;     reports that he has been smoking cigarettes. He has been smoking about 0.50 packs per day. He has never used smokeless tobacco. He reports current alcohol use of about 4.0 standard drinks of alcohol per week. He reports current drug use.  No Known Allergies  Family History  Problem Relation Age of Onset  . CVA Mother   . CVA Brother   . Diabetes Mellitus II Father       Prior to Admission medications   Medication Sig Start Date End Date Taking? Authorizing Provider  nicotine (NICODERM CQ - DOSED IN MG/24 HOURS) 14 mg/24hr patch Place 1 patch (14 mg total) onto the skin daily. 09/08/15   Katharina Caper, MD  polyethylene glycol (MIRALAX / GLYCOLAX) packet Take 17 g by mouth daily as needed for mild constipation. 09/08/15   Katharina Caper, MD  sulfamethoxazole-trimethoprim (BACTRIM DS,SEPTRA DS) 800-160 MG tablet Take 1 tablet 2 (two) times daily by mouth. 07/06/17   Minna Antis, MD    Physical Exam: Vitals:   01/11/21 1223 01/11/21 1230 01/11/21 1332 01/11/21 1341  BP: 139/84 (!) 153/86 (!) 149/91   Pulse: (!) 102 99 100   Resp: 18 18 18    Temp: 97.7 F (36.5 C)     SpO2: 98% 99% 95%   Weight:    106.6 kg  Height:    6\' 4"  (1.93 m)     Vitals:   01/11/21 1223 01/11/21 1230 01/11/21 1332 01/11/21 1341  BP: 139/84 (!) 153/86 (!) 149/91   Pulse: (!) 102 99 100   Resp: 18 18 18    Temp: 97.7 F (36.5 C)     SpO2: 98% 99% 95%   Weight:    106.6 kg  Height:    6\' 4"  (1.93 m)      Constitutional: Alert and oriented x 3. Not in any apparent distress HEENT:      Head: Normocephalic and atraumatic.         Eyes: PERLA, EOMI, Conjunctivae are normal. Sclera is non-icteric.        Mouth/Throat: Mucous membranes are moist.       Neck: Supple with no signs of meningismus. Cardiovascular:  Tachycardic. No murmurs, gallops, or rubs. 2+ symmetrical distal pulses are present . No JVD. No LE edema Respiratory: Respiratory effort normal .Lungs sounds clear bilaterally. No wheezes, crackles, or rhonchi.  Gastrointestinal: Soft, non tender, and non distended with positive bowel sounds.  Genitourinary: No CVA tenderness. Musculoskeletal:  Amputation of left great toe and amputation of the second toe on the right foot Neurologic:  Face is symmetric. Moving all extremities. No gross focal neurologic deficits  Skin: Skin is warm, dry.  Ulcer with purulent drainage on the plantar surface of the right foot, ulcer on the plantar surface of the left foot with no drainage.  Chronic changes on the dorsum of both feet bilaterally Psychiatric: Mood and affect are normal   Labs on Admission: I have personally reviewed following labs and imaging studies  CBC: Recent Labs  Lab 01/11/21 1235  WBC 8.5  NEUTROABS 6.3  HGB 13.9  HCT 40.7  MCV 101.2*  PLT 227   Basic Metabolic Panel: Recent Labs  Lab 01/11/21 1235  NA 134*  K 3.0*  CL 100  CO2 22  GLUCOSE 142*  BUN <5*  CREATININE 0.63  CALCIUM 8.9   GFR: Estimated Creatinine Clearance: 128.2 mL/min (by C-G formula based on SCr of 0.63 mg/dL). Liver Function Tests: Recent Labs  Lab 01/11/21 1235  AST 18  ALT 12  ALKPHOS 70  BILITOT 0.7  PROT 7.7  ALBUMIN 3.6   No results for input(s): LIPASE, AMYLASE in the last 168 hours. No results for input(s): AMMONIA in the last 168 hours. Coagulation Profile: Recent Labs  Lab 01/11/21 1235  INR 1.0   Cardiac Enzymes: No results for input(s): CKTOTAL, CKMB, CKMBINDEX, TROPONINI in the last 168 hours. BNP (last 3 results) No results for input(s): PROBNP in the last 8760 hours. HbA1C: No results for input(s): HGBA1C in the last 72 hours. CBG: No results for input(s):  GLUCAP in the last 168 hours. Lipid Profile: No results for input(s): CHOL, HDL, LDLCALC, TRIG, CHOLHDL, LDLDIRECT in the last 72 hours. Thyroid Function Tests: No results for input(s): TSH, T4TOTAL, FREET4, T3FREE, THYROIDAB in the last 72 hours. Anemia Panel: No results for input(s): VITAMINB12, FOLATE, FERRITIN, TIBC, IRON, RETICCTPCT in the last 72 hours. Urine analysis:    Component Value Date/Time   COLORURINE COLORLESS (A) 09/03/2015 1234   APPEARANCEUR CLEAR (A) 09/03/2015 1234   LABSPEC 1.001 (L) 09/03/2015 1234   PHURINE 5.0 09/03/2015 1234   GLUCOSEU NEGATIVE 09/03/2015 1234   HGBUR NEGATIVE 09/03/2015 1234   BILIRUBINUR NEGATIVE 09/03/2015 1234   KETONESUR NEGATIVE 09/03/2015 1234   PROTEINUR NEGATIVE 09/03/2015 1234   NITRITE NEGATIVE 09/03/2015 1234   LEUKOCYTESUR NEGATIVE 09/03/2015 1234    Radiological Exams on Admission: DG Chest Port 1 View  Result Date: 01/11/2021 CLINICAL DATA:  Sepsis. EXAM: PORTABLE CHEST 1 VIEW COMPARISON:  None. FINDINGS: 1237 hours. The lungs are clear without focal pneumonia, edema, pneumothorax or pleural effusion. The cardiopericardial silhouette is within normal limits for size. The visualized bony structures of the thorax show no acute abnormality. IMPRESSION: No active disease. Electronically Signed   By: Kennith CenterEric  Mansell M.D.   On: 01/11/2021 13:16   DG Foot Complete Left  Result Date: 01/11/2021 CLINICAL DATA:  Diabetic foot ulcer. EXAM: LEFT FOOT - COMPLETE 3+ VIEW COMPARISON:  Left foot x-rays dated July 06, 2017. FINDINGS: Plantar soft tissue ulceration at the first amputation stump. Additional ulceration at the tip of the third toe with underlying erosion of the third distal phalanx tuft, new since the prior study. No acute fracture or dislocation. Prior great toe amputation. Old healed fractures of the third metatarsal and fifth proximal phalanx. Interval bony fusion of the second MTP joint. Chronic deformity of the middle and  lateral cuneiforms with lateral Lisfranc subluxation and midfoot collapse, similar to prior study. Osteopenia. IMPRESSION: 1. New ulceration at the tip of the third toe with underlying erosion of the third distal phalanx tuft, concerning for osteomyelitis. 2. Plantar soft tissue ulceration at the first amputation stump. 3. Chronic neuropathic arthropathy of the midfoot. Electronically Signed   By: Obie DredgeWilliam T Derry M.D.   On: 01/11/2021 14:07   DG Foot Complete Right  Result Date: 01/11/2021 CLINICAL DATA:  History of prior osteomyelitis, diabetic foot ulcer. Swelling and open wounds on the bottom of the foot. EXAM:  RIGHT FOOT COMPLETE - 3+ VIEW COMPARISON:  September 09, 2016. FINDINGS: Interval partial amputation of the second metatarsal. Lucency projects between the head of the first and second metatarsal potentially related to ulceration along the bottom of the foot in this area.w lucency also surrounds the head of the third metatarsal with extensive edema suggested in the soft tissues. Subluxation and/or dislocation of the third toe at the tarsal metatarsal joint. Irregularity of the soft tissues along the distal aspect of the great toe with deformity of the distal phalanx of the great toe. No frank bony erosion. IMPRESSION: 1. Interval partial amputation of the second metatarsal. 2. Lucency in the area of the head of the third metatarsal and between the head of first and third metatarsal in the soft tissues likely reflects soft tissue ulceration given appearance of this area on the lateral view. No definitive signs of osteomyelitis in this area though there is subluxation and/or dislocation of the tarsal metatarsal joint. 3. Irregularity of the distal aspect of the great toe also suggests ulceration with deformity of the distal phalanx that raises the question of prior osteomyelitis and bone destruction. No current lucency in this area to suggest ongoing changes of active osteomyelitis by plain film  evaluation. Electronically Signed   By: Donzetta Kohut M.D.   On: 01/11/2021 14:08     Assessment/Plan Principal Problem:   Diabetic foot ulcer (HCC) Active Problems:   Hypokalemia   Anemia of chronic disease   Acute osteomyelitis of toe of right foot (HCC)   Tobacco use disorder     Diabetic foot with concerns for osteomyelitis In a patient with a history of diabetes mellitus not on any medication as well as peripheral vascular disease  We will place patient on IV Rocephin and Flagyl Patient received a dose of vancomycin in the ER Consult podiatry for further treatment recommendations      Nicotine dependence Smoking cessation has been discussed with patient in detail We will place patient on nicotine transdermal patch 14 mg daily     Diabetes mellitus with complications of peripheral vascular disease Maintain consistent carbohydrate diet Glycemic control with sliding scale insulin. Place patient on aspirin and statins for peripheral arterial disease Hold off on beta-blocker administration until urine drug screen is done since patient has a history of cocaine use/abuse Obtain hemoglobin A1c levels    Anemia of chronic disease H&H is stable    Hypokalemia Supplement potassium Obtain magnesium levels     DVT prophylaxis: SCD Code Status: full code Family Communication: Greater than 50% of time was spent discussing patient's condition and plan of care with him at the bedside.  All questions and concerns have been addressed.  He verbalizes understanding and agrees with the plan. Disposition Plan: Back to previous home environment Consults called: Podiatry Status: At the time of admission, it appears that the appropriate admission status for this patient is inpatient. This is judged to be reasonable and necessary in order to provide the required intensity of service to ensure the patient's safety given the presenting symptoms, physical exam findings and initial  radiographic and laboratory data in the context of their comorbid conditions. Patient requires inpatient status due to high intensity of service, high risk for further deterioration and high frequency of surveillance required.    Lucile Shutters MD [facility]     01/11/2021, 4:22 PM

## 2021-01-11 NOTE — ED Triage Notes (Signed)
Pt comes with c/o diabetic ulcer  To right foot. Pt states this started about 2 weeks ago. Pt states swelling and 2 open wounds on bottom of foot.  Pt is diabetic and has already had some amputations.  Pt denies any fevers.

## 2021-01-11 NOTE — ED Provider Notes (Signed)
[facility] Emergency Department Provider Note ____________________________________________   Event Date/Time   First MD Initiated Contact with Patient 01/11/21 1224     (approximate)  I have reviewed the triage vital signs and the nursing notes.  HISTORY  Chief Complaint Diabetic Ulcer   HPI [name] [name] is a 60 y.o. malewho presents to the ED for evaluation of diabetic foot ulcer on the right foot.  Chart review indicates no medical history in our system for the past 4 years.  Patient presents to the ED for evaluation of "couple weeks" of worsening swelling, pain and discharge from the ulcer to the base of his right foot.  He reports having ulcers to the base of both of his feet, but the right foot is worse.  Reports lingering pain to his right foot that is worse than normal.  Reports that he typically does not have much feeling in his bilateral feet.  Reports polyuria without dysuria or hematuria.  Denies systemic symptoms such as fevers, chills, chest pain, shortness of breath, weakness, falls, syncope or dizziness.   Past Medical History:  Diagnosis Date  . Anemia of chronic disease   . Diabetes mellitus without complication (HCC)   . Tobacco use disorder   . Toe gangrene [facility])     Patient Active Problem List   Diagnosis Date Noted  . Diabetic foot ulcer (HCC) 01/11/2021  . Acute osteomyelitis of toe of right foot (HCC) 09/19/2016  . 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 Anemia 09/08/2015  . Cocaine abuse (HCC) 09/08/2015  . Osteomyelitis (HCC) 09/03/2015  . Hypokalemia 07/14/2015  . Leukocytosis 07/14/2015  . Anemia of chronic disease 07/14/2015  . Tobacco abuse 07/14/2015  . SIRS (systemic inflammatory response syndrome) (HCC) 07/11/2015  . 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 ACHILLES TENDON SURGERY Right 09/22/2016   Procedure: ACHILLES TENDON LENGTHENING;  Surgeon: Gwyneth Revels, DPM;   Location: [facility] ORS;  Service: Podiatry;  Laterality: Right;  . AMPUTATION TOE Left 07/12/2015   Procedure: AMPUTATION TOE;  Surgeon: Gwyneth Revels, DPM;  Location: [facility] ORS;  Service: Podiatry;  Laterality: Left;  . AMPUTATION TOE Right 09/22/2016   Procedure: AMPUTATION TOE;  Surgeon: Gwyneth Revels, DPM;  Location: [facility] ORS;  Service: Podiatry;  Laterality: Right;  . IRRIGATION AND DEBRIDEMENT FOOT Left 07/12/2015   Procedure: IRRIGATION AND DEBRIDEMENT FOOT;  Surgeon: Gwyneth Revels, DPM;  Location: [facility] ORS;  Service: Podiatry;  Laterality: Left;  . IRRIGATION AND DEBRIDEMENT FOOT Left 09/06/2015   Procedure: IRRIGATION AND DEBRIDEMENT FOOT;  Surgeon: Recardo Evangelist, DPM;  Location: [facility] ORS;  Service: Podiatry;  Laterality: Left;    Prior to Admission medications   Medication Sig Start Date End Date Taking? Authorizing Provider  nicotine (NICODERM CQ - DOSED IN MG/24 HOURS) 14 mg/24hr patch Place 1 patch (14 mg total) onto the skin daily. 09/08/15   Katharina Caper, MD  polyethylene glycol (MIRALAX / GLYCOLAX) packet Take 17 g by mouth daily as needed for mild constipation. 09/08/15   Katharina Caper, MD  sulfamethoxazole-trimethoprim (BACTRIM DS,SEPTRA DS) 800-160 MG tablet Take 1 tablet 2 (two) times daily by mouth. 07/06/17   Minna Antis, MD    Allergies Patient has no known allergies.  Family History  Problem Relation Age of Onset  . CVA Mother   . CVA Brother   . Diabetes Mellitus II Father     Social History Social History   Tobacco Use  . Smoking status:  Current Every Day Smoker    Packs/day: 0.50    Types: Cigarettes  . Smokeless tobacco: Never Used  Substance Use Topics  . Alcohol use: Yes    Alcohol/week: 4.0 standard drinks    Types: 4 Cans of beer per week    Comment: daily  . Drug use: Yes    Comment: cocaine occasionally    Review of Systems  Constitutional: No fever/chills Eyes: No visual changes. ENT: No sore throat. Cardiovascular: Denies chest  pain. Respiratory: Denies shortness of breath. Gastrointestinal: No abdominal pain.  No nausea, no vomiting.  No diarrhea.  No constipation. Genitourinary: Negative for dysuria. Musculoskeletal: Negative for back pain.  Positive for atraumatic right greater than left foot pain Skin: Negative for rash. Neurological: Negative for headaches, focal weakness or numbness.  ____________________________________________   PHYSICAL EXAM:  VITAL SIGNS: Vitals:   01/11/21 1230 01/11/21 1332  BP: (!) 153/86 (!) 149/91  Pulse: 99 100  Resp: 18 18  Temp:    SpO2: 99% 95%    Constitutional: Alert and oriented. Well appearing and in no acute distress. Eyes: Conjunctivae are normal. PERRL. EOMI. Head: Atraumatic. Nose: No congestion/rhinnorhea. Mouth/Throat: Mucous membranes are moist.  Oropharynx non-erythematous. Neck: No stridor. No cervical spine tenderness to palpation. Cardiovascular: Tachycardic rate, regular rhythm. Grossly normal heart sounds.  Good peripheral circulation. Respiratory: Normal respiratory effort.  No retractions. Lungs CTAB. Gastrointestinal: Soft , nondistended, nontender to palpation. No CVA tenderness. Musculoskeletal:  No signs of acute trauma. Right foot is diffusely swollen, warm to the touch and mildly tender to palpation diffusely.  No signs of discrete trauma or bruising.  Plantar aspect of the foot has an open wound with some expressible purulence.  Warmth extends to the ankle, but does not extend up the leg.  Calf is soft.  No crepitus. Left foot has some mild and lesser swelling, no warmth to touch, and an open ulcer to the plantar aspect the foot without purulence. Multiple digit amputations and chronic deformities to bilateral feet and toes. Neurologic:  Normal speech and language. No gross focal neurologic deficits are appreciated. No gait instability noted. Skin:  Skin is warm, dry and intact. No rash noted. Psychiatric: Mood and affect are normal. Speech  and behavior are normal. ____________________________________________   LABS (all labs ordered are listed, but only abnormal results are displayed)  Labs Reviewed  LACTIC ACID, PLASMA - Abnormal; Notable for the following components:      Result Value   Lactic Acid, Venous 2.6 (*)    All other components within normal limits  COMPREHENSIVE METABOLIC PANEL - Abnormal; Notable for the following components:   Sodium 134 (*)    Potassium 3.0 (*)    Glucose, Bld 142 (*)    BUN <5 (*)    All other components within normal limits  CBC WITH DIFFERENTIAL/PLATELET - Abnormal; Notable for the following components:   RBC 4.02 (*)    MCV 101.2 (*)    MCH 34.6 (*)    All other components within normal limits  CULTURE, BLOOD (SINGLE)  URINE CULTURE  SARS CORONAVIRUS 2 (TAT 6-24 HRS)  PROTIME-INR  APTT  LACTIC ACID, PLASMA  URINALYSIS, COMPLETE (UACMP) WITH MICROSCOPIC   ____________________________________________  12 Lead EKG  Sinus rhythm, rate of 97 bpm.  Normal axis and intervals.  Some wandering baseline and no evidence of acute ischemia ____________________________________________  RADIOLOGY  ED MD interpretation: Plain film of the chest reviewed by me without evidence of acute cardiopulmonary pathology.  Official  radiology report(s): DG Chest Port 1 View  Result Date: 01/11/2021 CLINICAL DATA:  Sepsis. EXAM: PORTABLE CHEST 1 VIEW COMPARISON:  None. FINDINGS: 1237 hours. The lungs are clear without focal pneumonia, edema, pneumothorax or pleural effusion. The cardiopericardial silhouette is within normal limits for size. The visualized bony structures of the thorax show no acute abnormality. IMPRESSION: No active disease. Electronically Signed   By: Kennith Center M.D.   On: 01/11/2021 13:16   DG Foot Complete Left  Result Date: 01/11/2021 CLINICAL DATA:  Diabetic foot ulcer. EXAM: LEFT FOOT - COMPLETE 3+ VIEW COMPARISON:  Left foot x-rays dated July 06, 2017. FINDINGS:  Plantar soft tissue ulceration at the first amputation stump. Additional ulceration at the tip of the third toe with underlying erosion of the third distal phalanx tuft, new since the prior study. No acute fracture or dislocation. Prior great toe amputation. Old healed fractures of the third metatarsal and fifth proximal phalanx. Interval bony fusion of the second MTP joint. Chronic deformity of the middle and lateral cuneiforms with lateral Lisfranc subluxation and midfoot collapse, similar to prior study. Osteopenia. IMPRESSION: 1. New ulceration at the tip of the third toe with underlying erosion of the third distal phalanx tuft, concerning for osteomyelitis. 2. Plantar soft tissue ulceration at the first amputation stump. 3. Chronic neuropathic arthropathy of the midfoot. Electronically Signed   By: Obie Dredge M.D.   On: 01/11/2021 14:07   DG Foot Complete Right  Result Date: 01/11/2021 CLINICAL DATA:  History of prior osteomyelitis, diabetic foot ulcer. Swelling and open wounds on the bottom of the foot. EXAM: RIGHT FOOT COMPLETE - 3+ VIEW COMPARISON:  September 09, 2016. FINDINGS: Interval partial amputation of the second metatarsal. Lucency projects between the head of the first and second metatarsal potentially related to ulceration along the bottom of the foot in this area.w lucency also surrounds the head of the third metatarsal with extensive edema suggested in the soft tissues. Subluxation and/or dislocation of the third toe at the tarsal metatarsal joint. Irregularity of the soft tissues along the distal aspect of the great toe with deformity of the distal phalanx of the great toe. No frank bony erosion. IMPRESSION: 1. Interval partial amputation of the second metatarsal. 2. Lucency in the area of the head of the third metatarsal and between the head of first and third metatarsal in the soft tissues likely reflects soft tissue ulceration given appearance of this area on the lateral view. No  definitive signs of osteomyelitis in this area though there is subluxation and/or dislocation of the tarsal metatarsal joint. 3. Irregularity of the distal aspect of the great toe also suggests ulceration with deformity of the distal phalanx that raises the question of prior osteomyelitis and bone destruction. No current lucency in this area to suggest ongoing changes of active osteomyelitis by plain film evaluation. Electronically Signed   By: Donzetta Kohut M.D.   On: 01/11/2021 14:08    ____________________________________________   PROCEDURES and INTERVENTIONS  Procedure(s) performed (including Critical Care):  .1-3 Lead EKG Interpretation Performed by: Delton Prairie, MD Authorized by: Delton Prairie, MD     Interpretation: abnormal     ECG rate:  106   ECG rate assessment: tachycardic     Rhythm: sinus tachycardia     Ectopy: none     Conduction: normal      Medications  vancomycin (VANCOREADY) IVPB 1250 mg/250 mL (has no administration in time range)  ceFEPIme (MAXIPIME) 2 g in sodium chloride 0.9 %  100 mL IVPB (2 g Intravenous New Bag/Given 01/11/21 1433)  lactated ringers bolus 1,000 mL (1,000 mLs Intravenous New Bag/Given 01/11/21 1433)    ____________________________________________   MDM / ED COURSE   60 year old male with diabetes not on medications presents to the ED with diabetic foot ulcerations with evidence of superimposed infection and possible osteomyelitis, requiring IV antibiotics and medical admission.  Presents tachycardic but remains hemodynamically stable without fever.  No sepsis criteria.  Blood work with lactic acidosis without leukocytosis, improving with fluid resuscitation and IV antibiotics.  CXR demonstrates no infiltrates.  Plain film of the right foot demonstrates no acute bony erosions or fractures.  Plain film of the left foot reveals some evidence of acute osteomyelitis to the tip of the left third digit.  Cultures were drawn and patient was started  on broad-spectrum antibiotics.  We will admit to hospitalist medicine for further work-up and management.     ____________________________________________   FINAL CLINICAL IMPRESSION(S) / ED DIAGNOSES  Final diagnoses:  Diabetic foot infection (HCC)  Other acute osteomyelitis of left foot [facility](HCC)     ED Discharge Orders    None       Ayaan Shutes   Note:  This document was prepared using Dragon voice recognition software and may include unintentional dictation errors.   Delton PrairieSmith, Willys Salvino, MD 01/11/21 1534

## 2021-01-11 NOTE — ED Notes (Signed)
X-ray at bedside

## 2021-01-12 ENCOUNTER — Inpatient Hospital Stay: Payer: Self-pay

## 2021-01-12 DIAGNOSIS — L97504 Non-pressure chronic ulcer of other part of unspecified foot with necrosis of bone: Secondary | ICD-10-CM

## 2021-01-12 DIAGNOSIS — E11621 Type 2 diabetes mellitus with foot ulcer: Secondary | ICD-10-CM

## 2021-01-12 DIAGNOSIS — M86172 Other acute osteomyelitis, left ankle and foot: Secondary | ICD-10-CM

## 2021-01-12 DIAGNOSIS — E11628 Type 2 diabetes mellitus with other skin complications: Secondary | ICD-10-CM

## 2021-01-12 DIAGNOSIS — L089 Local infection of the skin and subcutaneous tissue, unspecified: Secondary | ICD-10-CM

## 2021-01-12 DIAGNOSIS — I739 Peripheral vascular disease, unspecified: Secondary | ICD-10-CM

## 2021-01-12 LAB — BASIC METABOLIC PANEL
Anion gap: 9 (ref 5–15)
BUN: 5 mg/dL — ABNORMAL LOW (ref 8–23)
CO2: 25 mmol/L (ref 22–32)
Calcium: 8.8 mg/dL — ABNORMAL LOW (ref 8.9–10.3)
Chloride: 104 mmol/L (ref 98–111)
Creatinine, Ser: 0.7 mg/dL (ref 0.61–1.24)
GFR, Estimated: >60 mL/min (ref >60)
Glucose, Bld: 121 mg/dL — ABNORMAL HIGH (ref 70–99)
Potassium: 3.9 mmol/L (ref 3.5–5.1)
Sodium: 138 mmol/L (ref 135–145)

## 2021-01-12 LAB — CBC
HCT: 36.1 % — ABNORMAL LOW (ref 39.0–52.0)
Hemoglobin: 12.6 g/dL — ABNORMAL LOW (ref 13.0–17.0)
MCH: 35.2 pg — ABNORMAL HIGH (ref 26.0–34.0)
MCHC: 34.9 g/dL (ref 30.0–36.0)
MCV: 100.8 fL — ABNORMAL HIGH (ref 80.0–100.0)
Platelets: 235 10*3/uL (ref 150–400)
RBC: 3.58 MIL/uL — ABNORMAL LOW (ref 4.22–5.81)
RDW: 11.8 % (ref 11.5–15.5)
WBC: 6.5 10*3/uL (ref 4.0–10.5)
nRBC: 0 % (ref 0.0–0.2)

## 2021-01-12 LAB — GLUCOSE, CAPILLARY
Glucose-Capillary: 138 mg/dL — ABNORMAL HIGH (ref 70–99)
Glucose-Capillary: 95 mg/dL (ref 70–99)
Glucose-Capillary: 107 mg/dL — ABNORMAL HIGH (ref 70–99)
Glucose-Capillary: 135 mg/dL — ABNORMAL HIGH (ref 70–99)

## 2021-01-12 LAB — SARS CORONAVIRUS 2 (TAT 6-24 HRS): SARS Coronavirus 2: NEGATIVE

## 2021-01-12 MED ORDER — ASCORBIC ACID 500 MG PO TABS
250.0000 mg | ORAL_TABLET | Freq: Every day | ORAL | Status: DC
  Administered 2021-01-12 – 2021-01-17 (×5): 250 mg via ORAL
  Filled 2021-01-12 (×4): qty 1

## 2021-01-12 MED ORDER — JUVEN PO PACK
1.0000 | PACK | Freq: Two times a day (BID) | ORAL | Status: DC
  Administered 2021-01-12 – 2021-01-17 (×9): 1 via ORAL

## 2021-01-12 MED ORDER — ENOXAPARIN SODIUM 40 MG/0.4ML IJ SOSY
40.0000 mg | PREFILLED_SYRINGE | INTRAMUSCULAR | Status: DC
  Administered 2021-01-12 – 2021-01-16 (×4): 40 mg via SUBCUTANEOUS
  Filled 2021-01-12 (×4): qty 0.4

## 2021-01-12 MED ORDER — ENSURE MAX PROTEIN PO LIQD
11.0000 [oz_av] | Freq: Two times a day (BID) | ORAL | Status: DC
  Administered 2021-01-12 – 2021-01-17 (×10): 11 [oz_av] via ORAL
  Filled 2021-01-12: qty 330

## 2021-01-12 NOTE — Progress Notes (Signed)
Inpatient Diabetes Program Recommendations  AACE/ADA: New Consensus Statement on Inpatient Glycemic Control   Target Ranges:  Prepandial:   less than 140 mg/dL      Peak postprandial:   less than 180 mg/dL (1-2 hours)      Critically ill patients:  140 - 180 mg/dL  Results for [name] [name], [name] [name] (MRN [id number]) as of 01/12/2021 07:09  Ref. Range 01/11/2021 18:10 01/11/2021 21:55  Glucose-Capillary Latest Ref Range: 70 - 99 mg/dL 423 (H) 536 (H)  Results for [name] [name], [name] [name] (MRN [id number]) as of 01/12/2021 07:09  Ref. Range 01/11/2021 17:01  Hemoglobin A1C Latest Ref Range: 4.8 - 5.6 % 5.0    Review of Glycemic Control  Diabetes history: DM2 Outpatient Diabetes medications: None Current orders for Inpatient glycemic control: Novolog 0-15 units TID with meals  Inpatient Diabetes Program Recommendations:    Insulin: Please consider decreasing Novolog correction to 0-9 units TID with meals.  NOTE: Noted consult for diabetes coordinator per lower extremity wound order set. Per chart, patient has hx of DM2 and is not on any DM medications outpatient. Glucose since arrival at hospital has been 106 and 116 mg/dl. No insulin given since arrival.  Would recommend decreasing Novolog correction scale to sensitive scale (0-9 units). Current A1C 5% on 01/11/21 indicating an average glucose of 97 mg/dl over the past 2-3 months. Will sign off consult; please re-consult of needed.  Thanks, Orlando Penner, RN, MSN, CDE Diabetes Coordinator Inpatient Diabetes Program [phone] (Team Pager from 8am to 5pm)

## 2021-01-12 NOTE — Progress Notes (Signed)
Initial Nutrition Assessment  DOCUMENTATION CODES:  Not applicable  INTERVENTION:   Continue current diet as ordered  Ensure Max po BID, each supplement provides 150 kcal and 30 grams of protein.   1 packet Juven BID, each packet provides 95 calories, 2.5 grams of protein (collagen), and 9.8 grams of carbohydrate (3 grams sugar); also contains 7 grams of L-arginine and L-glutamine, 300 mg vitamin C, 15 mg vitamin E, 1.2 mcg vitamin B-12, 9.5 mg zinc, 200 mg calcium, and 1.5 g  Calcium Beta-hydroxy-Beta-methylbutyrate to support wound healing  250mg  vitamin d 1x/d to promote wound healing  NUTRITION DIAGNOSIS:  Increased nutrient needs related to wound healing as evidenced by estimated needs.  GOAL:  Patient will meet greater than or equal to 90% of their needs  MONITOR:  Labs,Supplement acceptance,PO intake  REASON FOR ASSESSMENT:  Consult Wound healing  ASSESSMENT:  Pt presented to ED with concerns over diabetic ulcers present to both feet which have been worsening for a couple of weeks. Imaging concerning for osteomyelitis to the left foot, no definite osteomyelitis to the right foot. PMH relevant for DM type 2, tobacco abuse, PVD, EtOH use. Hx of amputation in the past related to non-healing ulcers.  Podiatry following pt and state both wounds probe deep to the bone. MRI of bilateral feet obtained this AM, results pending. Care plan will be determined by MRI results.   Pt resting in bed at the time of visit. Pt reports a good appetite today and that he also eats well at home. Denies GI distress or recent weight changes. Discussed nutrition and wound healing and the need for additional protein and vitamins that could assist with healing. Pt agreeable to Juven and ensure max.   Average Meal Intake: . 5/17-5/18: 100% intake x 2 recorded meals  Relevant Scheduled Meds: . atorvastatin  40 mg Oral Daily  . insulin aspart  0-15 Units Subcutaneous TID WC  . potassium chloride  40  mEq Oral BID   Relevant Continuous Infusions: . cefTRIAXone (ROCEPHIN)  IV 2 g (01/11/21 2110)  . metronidazole 500 mg (01/12/21 01/14/21)   Relevant PRN Meds: ondansetron, polyethylene glycol  Labs reviewed  NUTRITION - FOCUSED PHYSICAL EXAM: Flowsheet Row Most Recent Value  Orbital Region No depletion  Upper Arm Region No depletion  Thoracic and Lumbar Region No depletion  Buccal Region No depletion  Temple Region No depletion  Clavicle Bone Region No depletion  Clavicle and Acromion Bone Region No depletion  Scapular Bone Region No depletion  Dorsal Hand No depletion  Patellar Region No depletion  Anterior Thigh Region No depletion  Posterior Calf Region No depletion  Edema (RD Assessment) Mild  Hair Reviewed  Eyes Reviewed  Mouth Reviewed  Skin Reviewed  Nails Reviewed     Diet Order:   Diet Order            Diet Carb Modified Fluid consistency: Thin; Room service appropriate? Yes  Diet effective now                 EDUCATION NEEDS:  No education needs have been identified at this time  Skin:  Skin Assessment: Skin Integrity Issues: Skin Integrity Issues:: Diabetic Ulcer Diabetic Ulcer: bilateral feet, probe to bone  Last BM:  5/17 per RN documentation  Height:  Ht Readings from Last 1 Encounters:  01/11/21 6\' 4"  (1.93 m)    Weight:  Wt Readings from Last 1 Encounters:  01/11/21 106.6 kg    Ideal Body Weight:  91.8 kg  BMI:  Body mass index is 28.61 kg/m.  Estimated Nutritional Needs:   Kcal:  2300-2500 kcal/d  Protein:  120-140 g/d  Fluid:  2.3L/d  Greig Castilla, RD, LDN Clinical Dietitian Pager on Amion

## 2021-01-12 NOTE — Consult Note (Signed)
   PODIATRY CONSULTATION  NAME [name] [name] MRN [id number] DOB 1961-06-14 DOA 01/11/2021   Reason for consult:  Chief Complaint  Patient presents with  . Diabetic Ulcer    Consulting physician: Lucile Shutters MD  History of present illness: 59 y.o. male PMHx diabetes mellitus, PVD, and PSxHx bilateral partial amputations with foot presenting for worsening infection bilateral with diabetic foot ulcers.  Patient has not been seen outpatient.  He is unaware how long he has had the wounds for to the bilateral feet.  He began to notice some pain and irritation to the foot and presented to the ED for further treatment and evaluation.  Podiatry consulted.  Patient seen at bedside this evening.  Past Medical History:  Diagnosis Date  . Anemia of chronic disease   . Diabetes mellitus without complication (HCC)   . Tobacco use disorder   . Toe gangrene (HCC)     CBC Latest Ref Rng & Units 01/12/2021 01/11/2021 07/06/2017  WBC 4.0 - 10.5 K/uL 6.5 8.5 7.8  Hemoglobin 13.0 - 17.0 g/dL 12.6(L) 13.9 11.5(L)  Hematocrit 39.0 - 52.0 % 36.1(L) 40.7 34.1(L)  Platelets 150 - 400 K/uL 235 227 453(H)    BMP Latest Ref Rng & Units 01/12/2021 01/11/2021 07/06/2017  Glucose 70 - 99 mg/dL 433(I) 951(O) 841(Y)  BUN 8 - 23 mg/dL 5(L) <6(A) <6(T)  Creatinine 0.61 - 1.24 mg/dL 0.16 0.10 9.32  Sodium 135 - 145 mmol/L 138 134(L) 139  Potassium 3.5 - 5.1 mmol/L 3.9 3.0(L) 3.2(L)  Chloride 98 - 111 mmol/L 104 100 106  CO2 22 - 32 mmol/L 25 22 22   Calcium 8.9 - 10.3 mg/dL ) 8.9 8.9          Physical Exam: General: The patient is alert and oriented x3 in no acute distress.   Dermatology: Ulcers noted to the plantar aspect of the bilateral forefoot similar in presentation.  Both wounds probe deep to the bone.  Minimal drainage.  No significant malodor noted.  Periwound is callused.  Vascular: There is some moderate erythema noted to the bilateral feet.  Known history of peripheral vascular  disease.  Neurological: Epicritic and protective threshold grossly intact bilaterally.   Musculoskeletal Exam: History of partial foot amputations bilateral  ASSESSMENT/PLAN OF CARE 1.  Diabetic foot ulcers bilateral - Recommend MRI bilateral feet.  Orders placed today.  Patient will likely need surgical management of the wounds and MRI necessary for surgical planning and to determine the extent of the osteomyelitis -Continue IV antibiotics as per admitting hospitalist  2.  PMHx PVD bilateral - Recommend ABIs bilateral to ensure adequate flow  -Podiatry will continue to follow    Thank you for the consult.  Please contact me directly with any questions or concerns.  Cell [phone]   [phone], DPM [facility]  Dr. Felecia Shelling, DPM    [address] [place], [place]                Office [phone]  Fax [phone]

## 2021-01-12 NOTE — Consult Note (Signed)
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  History of amputation of left great toe and amputation of the second toe on the right foot Reason for Consult:  Nonhealing ulcerations to bilateral plantar feet. Podiatry has consulted and is awaiting MRI results.  Notes indicate surgical intervention is likely needed.  Wound type: infectious neuropathic  Pressure Injury POA: Yes/No/NA Measurement:Left plantar foot:  Near amputation of great toe:  1 cm x 1 cm, probes to bone.  Right plantar foot: Mid foot at second toe amputation site. 1 cm x 0.7 cm , probes to bone Wound UYQ:[id number] to visualize  Drainage (amount, consistency, odor) moderate serosanguinous  Musty odor.  Periwound: Charcot deformity present, causing pressure points Dressing procedure/placement/frequency:Pending MRI results and surgical intervention:  Cleanse wounds to plantar feet with NS and pat dry. Apply Iodoform packing strip to wound bed. COver with dry dressing.  Change daily.  Will not follow at this time.  Please re-consult if needed.  Maple Hudson MSN, RN, FNP-BC CWON Wound, Ostomy, Continence Nurse Pager [phone]

## 2021-01-12 NOTE — Progress Notes (Signed)
Subjective:  Patient ID: [name] [name], male    DOB: 11/28/1960,  MRN: [id number]  Feeling well not having much pain he went to MRI this morning  Negative for chest pain and shortness of breath Review of all other systems is negative Objective:   Vitals:   01/12/21 0744 01/12/21 1126  BP: (!) 142/84 (!) 145/91  Pulse: 91 87  Resp: 16 16  Temp: 98.2 F (36.8 C) 98.5 F (36.9 C)  SpO2: 99% 99%   General AA&O x3. Normal mood and affect.  Vascular  foot is warm, pulses are weakly palpable  Neurologic Epicritic sensation grossly intact.  Dermatologic  submetatarsal 1 left and 3 right ulcerations probe deep to underlying capsule and bone, no gross purulence  Orthopedic: MMT 5/5 in dorsiflexion, plantarflexion, inversion, and eversion. Normal joint ROM without pain or crepitus.   Study Result  Narrative & Impression  CLINICAL DATA:  Foot swelling, diabetic, osteomyelitis suspected, xray done  EXAM: MRI OF THE LEFT FOOT WITHOUT CONTRAST  TECHNIQUE: Multiplanar, multisequence MR imaging of the left foot was performed. No intravenous contrast was administered.  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 Prior great toe amputation. There is mild edema signal within the residual first metatarsal, without corresponding low T1 signal.  Ligaments  Intact lesser digit plantar plates.  Muscles and Tendons  Diffuse muscular atrophy.  Soft tissues  Generalized soft tissue edema. There is a head plantar soft tissue ulcer along the medial forefoot, with significant adjacent edema. This is in close proximity to the residual first metatarsal.  IMPRESSION: Medial plantar soft tissue ulcer with adjacent edema in close proximity to the first metatarsal. Mild edema signal within the residual first metatarsal without corresponding low T1 signal, which is  compatible with either reactive changes or early osteomyelitis.   Electronically Signed   By: Caprice Renshaw   On: 01/12/2021 13:18   Study Result  Narrative & Impression  CLINICAL DATA:  Foot swelling, diabetic, osteomyelitis suspected, xray done  EXAM: MRI OF THE RIGHT FOREFOOT WITHOUT CONTRAST  TECHNIQUE: Multiplanar, multisequence MR imaging of the right foot was performed. No intravenous contrast was administered.  COMPARISON:  Right foot radiograph 01/11/2021  FINDINGS: Bones/Joint/Cartilage  Prior second toe amputation with residual second metatarsal. There is bony edema within the mid to distal third metatarsal and proximal phalanx with intermediate, faint low T1 signal. There is a joint effusion of the third MTP joint.  Ligaments  Likely third digit plantar plate tear with hyperextension and dorsal subluxation at the MTP joint.  Muscles and Tendons  Diffuse muscle atrophy of the foot.  Soft tissues  Extensive soft tissue swelling in the foot. There is a plantar soft tissue ulcer which extends to the cortical bone of the third metatarsal head (axial T2 image 19). There is an additional superficial plantar wound proximally and medially at the level of the mid first metatarsal,, without adjacent fluid collection.  IMPRESSION: Forefoot plantar surface soft tissue ulcer extending to the third metatarsal head, with evidence of third MTP septic arthritis and likely early osteomyelitis in the adjacent third metatarsal and proximal phalanx. Edema signal in the third metatarsal extends proximally to the proximal mid shaft.   Electronically Signed   By: Caprice Renshaw   On: 01/12/2021 13:13    Assessment & Plan:  Patient was evaluated and treated and all questions answered.  Bilateral diabetic foot infections with osteomyelitis -MRIs reviewed  agree with above reads -At this point given his clinical stability I would hold his antibiotics in  anticipation of OR to increase operative culture yield, can restart after OR with targeted therapy once cultures have been taken -Awaiting ABIs bilateral lower extremities to evaluate vascular flow.  If minimal disease then plan to take to OR on Friday for partial ray resections bilaterally.  If ABIs with evidence of disease then would recommend vascular consult   Edwin Cap, DPM  Accessible via secure chat for questions or concerns.

## 2021-01-12 NOTE — Progress Notes (Addendum)
PROGRESS NOTE    [name] [name]  DUK:[id number] DOB: July 05, 1961 DOA: 01/11/2021 PCP: Patient, No Pcp Per (Inactive)   Chief complaint.  Bilateral foot wounds. Brief Narrative:  [name] [name] is a 60 y.o. male with medical history significant for diabetes mellitus, nicotine dependence, peripheral vascular disease status post amputation of the left great toe and second toe on the right foot, alcohol dependence who presents to the emergency room for evaluation of wounds to both feet.  Patient has been seen by podiatry, MRI of bilateral foot showed early osteomyelitis in both side.  Patient currently treated with IV antibiotics with Rocephin, Flagyl.   Assessment & Plan:   Principal Problem:   Diabetic foot ulcer (HCC) Active Problems:   Hypokalemia   Anemia of chronic disease   Acute osteomyelitis of toe of right foot (HCC)   Tobacco use disorder   Peripheral vascular disease (HCC)   Diabetic foot infection (HCC)  #1.  Bilateral diabetic foot ulcer with osteomyelitis. Diabetes type 2 , controlled. MRI showed bilateral osteomyelitis, with the right third MPT septic joint. Patient has been seen by podiatry, scheduled for surgical intervention.  Podiatry has recommended hold off antibiotics to increase the yield of a biopsy results, given patient clinical stability, it is reasonable to hold antibiotic for now. Continue sliding scale insulin.  Diabetes seems to be controlled.  2.  Anemia of chronic disease. Follow.  3.  Hypokalemia. Recheck a level tomorrow.    DVT prophylaxis: Lovenox Code Status: Full Family Communication:  Disposition Plan:  .   Status is: Inpatient  Remains inpatient appropriate because:Inpatient level of care appropriate due to severity of illness   Dispo: The patient is from: Home              Anticipated d/c is to: Home              Patient currently is not medically stable to d/c.   Difficult to place patient No        I/O last 3 completed  shifts: In: 120 [P.O.:120] Out: 200 [Urine:200] Total I/O In: 120 [P.O.:120] Out: -      Consultants:   Podiatry  Procedures: None  Antimicrobials:  Vancomycin and Rocephin.  Subjective: Patient feels well today, has some pain in the foot.  But no fever or chills. No abdominal pain or nausea vomiting. Nausea dysuria hematuria. No short of breath or cough.  Objective: Vitals:   01/12/21 0028 01/12/21 0457 01/12/21 0744 01/12/21 1126  BP: 122/79 137/83 (!) 142/84 (!) 145/91  Pulse: 88 89 91 87  Resp: 17 17 16 16   Temp: 97.9 F (36.6 C) 98 F (36.7 C) 98.2 F (36.8 C) 98.5 F (36.9 C)  TempSrc:   Oral Oral  SpO2: 99% 97% 99% 99%  Weight:      Height:        Intake/Output Summary (Last 24 hours) at 01/12/2021 1530 Last data filed at 01/12/2021 1405 Gross per 24 hour  Intake 240 ml  Output 200 ml  Net 40 ml   Filed Weights   01/11/21 1341  Weight: 106.6 kg    Examination:  General exam: Appears calm and comfortable  Respiratory system: Clear to auscultation. Respiratory effort normal. Cardiovascular system: S1 & S2 heard, RRR. No JVD, murmurs, rubs, gallops or clicks. No pedal edema. Gastrointestinal system: Abdomen is nondistended, soft and nontender. No organomegaly or masses felt. Normal bowel sounds heard. Central nervous system: Alert and oriented. No focal neurological deficits. Extremities:  Bilateral foot ulcer. Skin: No rashes, lesions or ulcers Psychiatry: Judgement and insight appear normal. Mood & affect appropriate.     Data Reviewed: I have personally reviewed following labs and imaging studies  CBC: Recent Labs  Lab 01/11/21 1235 01/12/21 0652  WBC 8.5 6.5  NEUTROABS 6.3  --   HGB 13.9 12.6*  HCT 40.7 36.1*  MCV 101.2* 100.8*  PLT 227 235   Basic Metabolic Panel: Recent Labs  Lab 01/11/21 1235 01/12/21 0652  NA 134* 138  K 3.0* 3.9  CL 100 104  CO2 22 25  GLUCOSE 142* 121*  BUN <5* 5*  CREATININE 0.63 0.70  CALCIUM 8.9  8.8*  MG 2.1  --    GFR: Estimated Creatinine Clearance: 128.2 mL/min (by C-G formula based on SCr of 0.7 mg/dL). Liver Function Tests: Recent Labs  Lab 01/11/21 1235  AST 18  ALT 12  ALKPHOS 70  BILITOT 0.7  PROT 7.7  ALBUMIN 3.6   No results for input(s): LIPASE, AMYLASE in the last 168 hours. No results for input(s): AMMONIA in the last 168 hours. Coagulation Profile: Recent Labs  Lab 01/11/21 1235  INR 1.0   Cardiac Enzymes: No results for input(s): CKTOTAL, CKMB, CKMBINDEX, TROPONINI in the last 168 hours. BNP (last 3 results) No results for input(s): PROBNP in the last 8760 hours. HbA1C: Recent Labs    01/11/21 1701  HGBA1C 5.0   CBG: Recent Labs  Lab 01/11/21 1810 01/11/21 2155 01/12/21 0746 01/12/21 1128  GLUCAP 106* 116* 138* 95   Lipid Profile: No results for input(s): CHOL, HDL, LDLCALC, TRIG, CHOLHDL, LDLDIRECT in the last 72 hours. Thyroid Function Tests: No results for input(s): TSH, T4TOTAL, FREET4, T3FREE, THYROIDAB in the last 72 hours. Anemia Panel: No results for input(s): VITAMINB12, FOLATE, FERRITIN, TIBC, IRON, RETICCTPCT in the last 72 hours. Sepsis Labs: Recent Labs  Lab 01/11/21 1235 01/11/21 1604  LATICACIDVEN 2.6* 2.1*    Recent Results (from the past 240 hour(s))  Blood culture (routine single)     Status: None (Preliminary result)   Collection Time: 01/11/21 12:48 PM   Specimen: BLOOD  Result Value Ref Range Status   Specimen Description BLOOD BLOOD LEFT ARM  Final   Special Requests   Final    BOTTLES DRAWN AEROBIC AND ANAEROBIC Blood Culture results may not be optimal due to an inadequate volume of blood received in culture bottles   Culture   Final    NO GROWTH < 24 HOURS Performed at [facility], [address], [place], [place] [address]    Report Status PENDING  Incomplete  SARS CORONAVIRUS 2 (TAT 6-24 HRS) Nasopharyngeal Nasopharyngeal Swab     Status: None   Collection Time: 01/11/21  2:30 PM    Specimen: Nasopharyngeal Swab  Result Value Ref Range Status   SARS Coronavirus 2 NEGATIVE NEGATIVE Final    Comment: (NOTE) SARS-CoV-2 target nucleic acids are NOT DETECTED.  The SARS-CoV-2 RNA is generally detectable in upper and lower respiratory specimens during the acute phase of infection. Negative results do not preclude SARS-CoV-2 infection, do not rule out co-infections with other pathogens, and should not be used as the sole basis for treatment or other patient management decisions. Negative results must be combined with clinical observations, patient history, and epidemiological information. The expected result is Negative.  Fact Sheet for Patients: HairSlick.no  Fact Sheet for Healthcare Providers: quierodirigir.com  This test is not yet approved or cleared by the [place] FDA and  has been authorized for detection and/or diagnosis of SARS-CoV-2 by FDA under an Emergency Use Authorization (EUA). This EUA will remain  in effect (meaning this test can be used) for the duration of the COVID-19 declaration under Se ction 564(b)(1) of the Act, 21 U.S.C. section 360bbb-3(b)(1), unless the authorization is terminated or revoked sooner.  Performed at [facility], [address] [place], [place] [address]          Radiology Studies: MR FOOT RIGHT WO CONTRAST  Result Date: 01/12/2021 CLINICAL DATA:  Foot swelling, diabetic, osteomyelitis suspected, xray done EXAM: MRI OF THE RIGHT FOREFOOT WITHOUT CONTRAST TECHNIQUE: Multiplanar, multisequence MR imaging of the right foot was performed. No intravenous contrast was administered. COMPARISON:  Right foot radiograph 01/11/2021 FINDINGS: Bones/Joint/Cartilage Prior second toe amputation with residual second metatarsal. There is bony edema within the mid to distal third metatarsal and proximal phalanx with intermediate, faint low T1 signal. There is a joint  effusion of the third MTP joint. Ligaments Likely third digit plantar plate tear with hyperextension and dorsal subluxation at the MTP joint. Muscles and Tendons Diffuse muscle atrophy of the foot. Soft tissues Extensive soft tissue swelling in the foot. There is a plantar soft tissue ulcer which extends to the cortical bone of the third metatarsal head (axial T2 image 19). There is an additional superficial plantar wound proximally and medially at the level of the mid first metatarsal,, without adjacent fluid collection. IMPRESSION: Forefoot plantar surface soft tissue ulcer extending to the third metatarsal head, with evidence of third MTP septic arthritis and likely early osteomyelitis in the adjacent third metatarsal and proximal phalanx. Edema signal in the third metatarsal extends proximally to the proximal mid shaft. Electronically Signed   By: Caprice RenshawJacob  Kahn   On: 01/12/2021 13:13   MR FOOT LEFT WO CONTRAST  Result Date: 01/12/2021 CLINICAL DATA:  Foot swelling, diabetic, osteomyelitis suspected, xray done EXAM: MRI OF THE LEFT FOOT WITHOUT CONTRAST TECHNIQUE: Multiplanar, multisequence MR imaging of the left foot was performed. No intravenous contrast was administered. 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 Prior great toe amputation. There is mild edema signal within the residual first metatarsal, without corresponding low T1 signal. Ligaments Intact lesser digit plantar plates. Muscles and Tendons Diffuse muscular atrophy. Soft tissues Generalized soft tissue edema. There is a head plantar soft tissue ulcer along the medial forefoot, with significant adjacent edema. This is in close proximity to the residual first metatarsal. IMPRESSION: Medial plantar soft tissue ulcer with adjacent edema in close proximity to the first metatarsal. Mild edema signal within the residual first metatarsal without  corresponding low T1 signal, which is compatible with either reactive changes or early osteomyelitis. Electronically Signed   By: Caprice RenshawJacob  Kahn   On: 01/12/2021 13:18   DG Chest Port 1 View  Result Date: 01/11/2021 CLINICAL DATA:  Sepsis. EXAM: PORTABLE CHEST 1 VIEW COMPARISON:  None. FINDINGS: 1237 hours. The lungs are clear without focal pneumonia, edema, pneumothorax or pleural effusion. The cardiopericardial silhouette is within normal limits for size. The visualized bony structures of the thorax show no acute abnormality. IMPRESSION: No active disease. Electronically Signed   By: Kennith CenterEric  Mansell M.D.   On: 01/11/2021 13:16   DG Foot Complete Left  Result Date: 01/11/2021 CLINICAL DATA:  Diabetic foot ulcer. EXAM: LEFT FOOT - COMPLETE 3+ VIEW COMPARISON:  Left foot x-rays dated July 06, 2017. FINDINGS: Plantar soft tissue ulceration at  the first amputation stump. Additional ulceration at the tip of the third toe with underlying erosion of the third distal phalanx tuft, new since the prior study. No acute fracture or dislocation. Prior great toe amputation. Old healed fractures of the third metatarsal and fifth proximal phalanx. Interval bony fusion of the second MTP joint. Chronic deformity of the middle and lateral cuneiforms with lateral Lisfranc subluxation and midfoot collapse, similar to prior study. Osteopenia. IMPRESSION: 1. New ulceration at the tip of the third toe with underlying erosion of the third distal phalanx tuft, concerning for osteomyelitis. 2. Plantar soft tissue ulceration at the first amputation stump. 3. Chronic neuropathic arthropathy of the midfoot. Electronically Signed   By: Obie Dredge M.D.   On: 01/11/2021 14:07   DG Foot Complete Right  Result Date: 01/11/2021 CLINICAL DATA:  History of prior osteomyelitis, diabetic foot ulcer. Swelling and open wounds on the bottom of the foot. EXAM: RIGHT FOOT COMPLETE - 3+ VIEW COMPARISON:  September 09, 2016. FINDINGS: Interval  partial amputation of the second metatarsal. Lucency projects between the head of the first and second metatarsal potentially related to ulceration along the bottom of the foot in this area.w lucency also surrounds the head of the third metatarsal with extensive edema suggested in the soft tissues. Subluxation and/or dislocation of the third toe at the tarsal metatarsal joint. Irregularity of the soft tissues along the distal aspect of the great toe with deformity of the distal phalanx of the great toe. No frank bony erosion. IMPRESSION: 1. Interval partial amputation of the second metatarsal. 2. Lucency in the area of the head of the third metatarsal and between the head of first and third metatarsal in the soft tissues likely reflects soft tissue ulceration given appearance of this area on the lateral view. No definitive signs of osteomyelitis in this area though there is subluxation and/or dislocation of the tarsal metatarsal joint. 3. Irregularity of the distal aspect of the great toe also suggests ulceration with deformity of the distal phalanx that raises the question of prior osteomyelitis and bone destruction. No current lucency in this area to suggest ongoing changes of active osteomyelitis by plain film evaluation. Electronically Signed   By: Donzetta Kohut M.D.   On: 01/11/2021 14:08        Scheduled Meds: . vitamin C  250 mg Oral Daily  . aspirin EC  81 mg Oral Daily  . atorvastatin  40 mg Oral Daily  . insulin aspart  0-15 Units Subcutaneous TID WC  . nicotine  14 mg Transdermal Daily  . nutrition supplement (JUVEN)  1 packet Oral BID BM  . Ensure Max Protein  11 oz Oral BID   Continuous Infusions: . cefTRIAXone (ROCEPHIN)  IV 2 g (01/11/21 2110)   And  . metronidazole 500 mg (01/12/21 0254)     LOS: 1 day    Time spent: 27 minutes    Marrion Coy, MD [facility]   To contact the attending provider between 7A-7P or the covering provider during after hours 7P-7A,  please log into the web site [email or web address] and access using universal [place] password for that web site. If you do not have the password, please call the hospital operator.  01/12/2021, 3:30 PM

## 2021-01-13 ENCOUNTER — Encounter: Payer: Self-pay | Admitting: Internal Medicine

## 2021-01-13 LAB — GLUCOSE, CAPILLARY
Glucose-Capillary: 146 mg/dL — ABNORMAL HIGH (ref 70–99)
Glucose-Capillary: 116 mg/dL — ABNORMAL HIGH (ref 70–99)
Glucose-Capillary: 115 mg/dL — ABNORMAL HIGH (ref 70–99)
Glucose-Capillary: 113 mg/dL — ABNORMAL HIGH (ref 70–99)

## 2021-01-13 LAB — URINE CULTURE: Culture: <10000 — AB

## 2021-01-13 NOTE — Progress Notes (Signed)
PROGRESS NOTE    [name] [name]  OZH:[id number] DOB: [birth year] DOA: 01/11/2021 PCP: Patient, No Pcp Per (Inactive)   Chief complaint.  Bilateral foot wounds. Brief Narrative:  [name] [name] a 60 y.o.malewith medical history significant fordiabetes mellitus, nicotine dependence, peripheral vascular disease status post amputation of the left great toe and second toe on the right foot,alcohol dependence who presents to the emergency room for evaluation of wounds to both feet.  Patient has been seen by podiatry, MRI of bilateral foot showed early osteomyelitis in both side.  Patient initially placed on Rocephin and Flagyl, then discontinued following recommendation from podiatry.  Assessment & Plan:   Principal Problem:   Diabetic foot ulcer (HCC) Active Problems:   Hypokalemia   Anemia of chronic disease   Acute osteomyelitis of toe of right foot (HCC)   Tobacco use disorder   Peripheral vascular disease (HCC)   Diabetic foot infection (HCC)  #1.  Bilateral diabetic foot ulcers with osteomyelitis. Type 2 diabetes relative controlled. MRI showed bilateral osteomyelitis, with the right third MPT septic joint.  Patient is a pending for full surgery and biopsy, antibiotics on hold per recommendation from podiatry.  We will start antibiotics once biopsies taken.  2.  Anemia of chronic disease. Stable  3.  Hypokalemia Improved.    DVT prophylaxis: Lovenox Code Status: Full Family Communication:  Disposition Plan:  .   Status is: Inpatient  Remains inpatient appropriate because:Inpatient level of care appropriate due to severity of illness   Dispo: The patient is from: Home              Anticipated d/c is to: Home              Patient currently is not medically stable to d/c.   Difficult to place patient No        I/O last 3 completed shifts: In: 480 [P.O.:480] Out: 1500 [Urine:1500] Total I/O In: 240 [P.O.:240] Out: 600 [Urine:600]     Consultants:    Podiatry  Procedures: None  Antimicrobials: None  Subjective: Doing well pending surgery. Denies any abdominal pain nausea vomiting. No signal pain in the foot or legs. No shortness of breath or cough. No fever or chills.  Objective: Vitals:   01/13/21 0115 01/13/21 0429 01/13/21 0904 01/13/21 1157  BP: (!) 148/84 (!) 153/97 137/78 (!) 147/88  Pulse: 91 97 94 84  Resp: 18 18 18 18   Temp: 98.5 F (36.9 C) 98.3 F (36.8 C) 98.2 F (36.8 C) 97.7 F (36.5 C)  TempSrc:   Oral Oral  SpO2: 98% 98% 99% 100%  Weight:      Height:        Intake/Output Summary (Last 24 hours) at 01/13/2021 1419 Last data filed at 01/13/2021 1413 Gross per 24 hour  Intake 480 ml  Output 1900 ml  Net -1420 ml   Filed Weights   01/11/21 1341  Weight: 106.6 kg    Examination:  General exam: Appears calm and comfortable  Respiratory system: Clear to auscultation. Respiratory effort normal. Cardiovascular system: S1 & S2 heard, RRR. No JVD, murmurs, rubs, gallops or clicks. No pedal edema. Gastrointestinal system: Abdomen is nondistended, soft and nontender. No organomegaly or masses felt. Normal bowel sounds heard. Central nervous system: Alert and oriented. No focal neurological deficits. Extremities: Lateral foot ulcers Skin: No rashes,  Psychiatry: Judgement and insight appear normal. Mood & affect appropriate.     Data Reviewed: I have personally reviewed following labs and imaging studies  CBC: Recent Labs  Lab 01/11/21 1235 01/12/21 0652  WBC 8.5 6.5  NEUTROABS 6.3  --   HGB 13.9 12.6*  HCT 40.7 36.1*  MCV 101.2* 100.8*  PLT 227 235   Basic Metabolic Panel: Recent Labs  Lab 01/11/21 1235 01/12/21 0652  NA 134* 138  K 3.0* 3.9  CL 100 104  CO2 22 25  GLUCOSE 142* 121*  BUN <5* 5*  CREATININE 0.63 0.70  CALCIUM 8.9 8.8*  MG 2.1  --    GFR: Estimated Creatinine Clearance: 128.2 mL/min (by C-G formula based on SCr of 0.7 mg/dL). Liver Function Tests: Recent  Labs  Lab 01/11/21 1235  AST 18  ALT 12  ALKPHOS 70  BILITOT 0.7  PROT 7.7  ALBUMIN 3.6   No results for input(s): LIPASE, AMYLASE in the last 168 hours. No results for input(s): AMMONIA in the last 168 hours. Coagulation Profile: Recent Labs  Lab 01/11/21 1235  INR 1.0   Cardiac Enzymes: No results for input(s): CKTOTAL, CKMB, CKMBINDEX, TROPONINI in the last 168 hours. BNP (last 3 results) No results for input(s): PROBNP in the last 8760 hours. HbA1C: Recent Labs    01/11/21 1701  HGBA1C 5.0   CBG: Recent Labs  Lab 01/12/21 1128 01/12/21 1718 01/12/21 2029 01/13/21 0906 01/13/21 1158  GLUCAP 95 107* 135* 146* 116*   Lipid Profile: No results for input(s): CHOL, HDL, LDLCALC, TRIG, CHOLHDL, LDLDIRECT in the last 72 hours. Thyroid Function Tests: No results for input(s): TSH, T4TOTAL, FREET4, T3FREE, THYROIDAB in the last 72 hours. Anemia Panel: No results for input(s): VITAMINB12, FOLATE, FERRITIN, TIBC, IRON, RETICCTPCT in the last 72 hours. Sepsis Labs: Recent Labs  Lab 01/11/21 1235 01/11/21 1604  LATICACIDVEN 2.6* 2.1*    Recent Results (from the past 240 hour(s))  Blood culture (routine single)     Status: None (Preliminary result)   Collection Time: 01/11/21 12:48 PM   Specimen: BLOOD  Result Value Ref Range Status   Specimen Description BLOOD BLOOD LEFT ARM  Final   Special Requests   Final    BOTTLES DRAWN AEROBIC AND ANAEROBIC Blood Culture results may not be optimal due to an inadequate volume of blood received in culture bottles   Culture   Final    NO GROWTH 2 DAYS Performed at [facility], [address], [place], [place] [address]    Report Status PENDING  Incomplete  SARS CORONAVIRUS 2 (TAT 6-24 HRS) Nasopharyngeal Nasopharyngeal Swab     Status: None   Collection Time: 01/11/21  2:30 PM   Specimen: Nasopharyngeal Swab  Result Value Ref Range Status   SARS Coronavirus 2 NEGATIVE NEGATIVE Final    Comment:  (NOTE) SARS-CoV-2 target nucleic acids are NOT DETECTED.  The SARS-CoV-2 RNA is generally detectable in upper and lower respiratory specimens during the acute phase of infection. Negative results do not preclude SARS-CoV-2 infection, do not rule out co-infections with other pathogens, and should not be used as the sole basis for treatment or other patient management decisions. Negative results must be combined with clinical observations, patient history, and epidemiological information. The expected result is Negative.  Fact Sheet for Patients: HairSlick.no  Fact Sheet for Healthcare Providers: quierodirigir.com  This test is not yet approved or cleared by the [place] FDA and  has been authorized for detection and/or diagnosis of SARS-CoV-2 by FDA under an Emergency Use Authorization (EUA). This EUA will remain  in effect (meaning this test can be used) for the duration  of the COVID-19 declaration under Se ction 564(b)(1) of the Act, 21 U.S.C. section 360bbb-3(b)(1), unless the authorization is terminated or revoked sooner.  Performed at [facility], [address] [place], [place] [address]   Urine culture     Status: Abnormal   Collection Time: 01/11/21  4:08 PM   Specimen: Urine, Random  Result Value Ref Range Status   Specimen Description   Final    URINE, RANDOM Performed at [facility], [address] [place], [place] [address]    Special Requests   Final    NONE Performed at [facility], [address] [place], [place] [address]    Culture (A)  Final    <10,000 COLONIES/mL INSIGNIFICANT GROWTH Performed at [facility], [address] [place], [place] [address]    Report Status 01/13/2021 FINAL  Final         Radiology Studies: MR FOOT RIGHT WO CONTRAST  Result Date: 01/12/2021 CLINICAL DATA:  Foot swelling, diabetic, osteomyelitis suspected, xray done EXAM:  MRI OF THE RIGHT FOREFOOT WITHOUT CONTRAST TECHNIQUE: Multiplanar, multisequence MR imaging of the right foot was performed. No intravenous contrast was administered. COMPARISON:  Right foot radiograph 01/11/2021 FINDINGS: Bones/Joint/Cartilage Prior second toe amputation with residual second metatarsal. There is bony edema within the mid to distal third metatarsal and proximal phalanx with intermediate, faint low T1 signal. There is a joint effusion of the third MTP joint. Ligaments Likely third digit plantar plate tear with hyperextension and dorsal subluxation at the MTP joint. Muscles and Tendons Diffuse muscle atrophy of the foot. Soft tissues Extensive soft tissue swelling in the foot. There is a plantar soft tissue ulcer which extends to the cortical bone of the third metatarsal head (axial T2 image 19). There is an additional superficial plantar wound proximally and medially at the level of the mid first metatarsal,, without adjacent fluid collection. IMPRESSION: Forefoot plantar surface soft tissue ulcer extending to the third metatarsal head, with evidence of third MTP septic arthritis and likely early osteomyelitis in the adjacent third metatarsal and proximal phalanx. Edema signal in the third metatarsal extends proximally to the proximal mid shaft. Electronically Signed   By: Caprice Renshaw   On: 01/12/2021 13:13   MR FOOT LEFT WO CONTRAST  Result Date: 01/12/2021 CLINICAL DATA:  Foot swelling, diabetic, osteomyelitis suspected, xray done EXAM: MRI OF THE LEFT FOOT WITHOUT CONTRAST TECHNIQUE: Multiplanar, multisequence MR imaging of the left foot was performed. No intravenous contrast was administered. 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 Prior great toe amputation. There is mild edema signal within the residual first metatarsal, without corresponding low T1 signal. Ligaments Intact lesser  digit plantar plates. Muscles and Tendons Diffuse muscular atrophy. Soft tissues Generalized soft tissue edema. There is a head plantar soft tissue ulcer along the medial forefoot, with significant adjacent edema. This is in close proximity to the residual first metatarsal. IMPRESSION: Medial plantar soft tissue ulcer with adjacent edema in close proximity to the first metatarsal. Mild edema signal within the residual first metatarsal without corresponding low T1 signal, which is compatible with either reactive changes or early osteomyelitis. Electronically Signed   By: Caprice Renshaw   On: 01/12/2021 13:18   US ARTERIAL ABI (SCREENING LOWER EXTREMITY)  Result Date: 01/12/2021 CLINICAL DATA:  60 year old male with a history of peripheral vascular disease and prior amputation EXAM: NONINVASIVE PHYSIOLOGIC VASCULAR STUDY OF BILATERAL LOWER  EXTREMITIES TECHNIQUE: Evaluation of both lower extremities was performed at rest, including calculation of ankle-brachial indices, multiple segmental pressure evaluation, segmental Doppler and segmental pulse volume recording. COMPARISON:  None. FINDINGS: Right ABI:  1.25 Left ABI:  1.14 Right Lower Extremity: Segmental Doppler at the right ankle demonstrates triphasic posterior tibial artery and monophasic dorsalis pedis Left Lower Extremity: Segmental Doppler at the left ankle demonstrates triphasic posterior tibial artery and monophasic dorsalis pedis IMPRESSION: Resting ABI of the bilateral lower extremities within normal limits, though potentially elevated. Segmental exam demonstrates waveforms of the bilateral posterior tibial arteries maintained with developing occlusive disease in the bilateral anterior tibial distribution. Signed, Yvone NeuJaime S. Reyne DumasWagner, DO, RPVI [facility] Electronically Signed   By: Gilmer MorJaime  Wagner D.O.   On: 01/12/2021 16:37        Scheduled Meds: . vitamin C  250 mg Oral Daily  . aspirin EC   81 mg Oral Daily  . atorvastatin  40 mg Oral Daily  . enoxaparin (LOVENOX) injection  40 mg Subcutaneous Q24H  . insulin aspart  0-15 Units Subcutaneous TID WC  . nicotine  14 mg Transdermal Daily  . nutrition supplement (JUVEN)  1 packet Oral BID BM  . Ensure Max Protein  11 oz Oral BID   Continuous Infusions:   LOS: 2 days    Time spent: 25 minutes    Marrion Coyekui Dalary Hollar, MD [facility]   To contact the attending provider between 7A-7P or the covering provider during after hours 7P-7A, please log into the web site [email or web address] and access using universal [place] password for that web site. If you do not have the password, please call the hospital operator.  01/13/2021, 2:19 PM

## 2021-01-14 ENCOUNTER — Encounter: Payer: Self-pay | Admitting: Internal Medicine

## 2021-01-14 ENCOUNTER — Encounter
Admission: EM | Disposition: A | Discharge status: Home / Self Care | Payer: Self-pay | Source: Home / Self Care | Attending: Internal Medicine

## 2021-01-14 ENCOUNTER — Inpatient Hospital Stay: Payer: Self-pay | Admitting: Anesthesiology

## 2021-01-14 ENCOUNTER — Inpatient Hospital Stay: Payer: Self-pay

## 2021-01-14 HISTORY — PX: INCISION AND DRAINAGE OF WOUND: SHX1803

## 2021-01-14 HISTORY — PX: AMPUTATION: SHX166

## 2021-01-14 LAB — GLUCOSE, CAPILLARY
Glucose-Capillary: 117 mg/dL — ABNORMAL HIGH (ref 70–99)
Glucose-Capillary: 106 mg/dL — ABNORMAL HIGH (ref 70–99)
Glucose-Capillary: 88 mg/dL (ref 70–99)
Glucose-Capillary: 126 mg/dL — ABNORMAL HIGH (ref 70–99)
Glucose-Capillary: 107 mg/dL — ABNORMAL HIGH (ref 70–99)

## 2021-01-14 LAB — SURGICAL PCR SCREEN
MRSA, PCR: NEGATIVE
Staphylococcus aureus: NEGATIVE

## 2021-01-14 SURGERY — AMPUTATION, FOOT, RAY
Anesthesia: General | Site: Foot | Laterality: Right

## 2021-01-14 MED ORDER — PROPOFOL 500 MG/50ML IV EMUL
INTRAVENOUS | Status: AC
  Filled 2021-01-14: qty 50

## 2021-01-14 MED ORDER — LACTATED RINGERS IV SOLN: INTRAVENOUS | Status: DC | PRN

## 2021-01-14 MED ORDER — VANCOMYCIN HCL 1000 MG IV SOLR
INTRAVENOUS | Status: DC | PRN
  Administered 2021-01-14: 1000 mg via INTRAVENOUS

## 2021-01-14 MED ORDER — OXYCODONE HCL 5 MG PO TABS: 5.0000 mg | ORAL_TABLET | Freq: Once | ORAL | Status: DC | PRN

## 2021-01-14 MED ORDER — ACETAMINOPHEN 10 MG/ML IV SOLN
INTRAVENOUS | Status: AC
  Filled 2021-01-14: qty 100

## 2021-01-14 MED ORDER — VANCOMYCIN HCL 1000 MG IV SOLR
INTRAVENOUS | Status: AC
  Filled 2021-01-14: qty 1000

## 2021-01-14 MED ORDER — GENTAMICIN SULFATE 40 MG/ML IJ SOLN
INTRAMUSCULAR | Status: DC | PRN
  Administered 2021-01-14: 160 mg

## 2021-01-14 MED ORDER — PROPOFOL 500 MG/50ML IV EMUL
INTRAVENOUS | Status: DC | PRN
  Administered 2021-01-14: 100 ug/kg/min via INTRAVENOUS

## 2021-01-14 MED ORDER — PROPOFOL 10 MG/ML IV BOLUS
INTRAVENOUS | Status: AC
  Filled 2021-01-14: qty 20

## 2021-01-14 MED ORDER — EPHEDRINE SULFATE 50 MG/ML IJ SOLN
INTRAMUSCULAR | Status: DC | PRN
  Administered 2021-01-14: 10 mg via INTRAVENOUS

## 2021-01-14 MED ORDER — GENTAMICIN SULFATE 40 MG/ML IJ SOLN
INTRAMUSCULAR | Status: AC
  Filled 2021-01-14: qty 6

## 2021-01-14 MED ORDER — VANCOMYCIN HCL 1250 MG/250ML IV SOLN
1250.0000 mg | Freq: Three times a day (TID) | INTRAVENOUS | Status: DC
  Administered 2021-01-14 – 2021-01-15 (×2): 1250 mg via INTRAVENOUS
  Filled 2021-01-14 (×7): qty 250

## 2021-01-14 MED ORDER — SODIUM CHLORIDE 0.9 % IV SOLN: INTRAVENOUS | Status: DC

## 2021-01-14 MED ORDER — PROPOFOL 10 MG/ML IV BOLUS
INTRAVENOUS | Status: DC | PRN
  Administered 2021-01-14: 50 mg via INTRAVENOUS

## 2021-01-14 MED ORDER — FENTANYL CITRATE (PF) 100 MCG/2ML IJ SOLN
INTRAMUSCULAR | Status: DC | PRN
  Administered 2021-01-14 (×4): 25 ug via INTRAVENOUS

## 2021-01-14 MED ORDER — FENTANYL CITRATE (PF) 100 MCG/2ML IJ SOLN: 25.0000 ug | INTRAMUSCULAR | Status: DC | PRN

## 2021-01-14 MED ORDER — BUPIVACAINE HCL (PF) 0.25 % IJ SOLN
INTRAMUSCULAR | Status: AC
  Filled 2021-01-14: qty 30

## 2021-01-14 MED ORDER — LIDOCAINE HCL (PF) 1 % IJ SOLN
INTRAMUSCULAR | Status: AC
  Filled 2021-01-14: qty 30

## 2021-01-14 MED ORDER — VASOPRESSIN 20 UNIT/ML IV SOLN
INTRAVENOUS | Status: DC | PRN
  Administered 2021-01-14 (×2): 2 [IU] via INTRAVENOUS

## 2021-01-14 MED ORDER — GLYCOPYRROLATE 0.2 MG/ML IJ SOLN
INTRAMUSCULAR | Status: DC | PRN
  Administered 2021-01-14: .2 mg via INTRAVENOUS

## 2021-01-14 MED ORDER — DEXMEDETOMIDINE (PRECEDEX) IN NS 20 MCG/5ML (4 MCG/ML) IV SYRINGE
PREFILLED_SYRINGE | INTRAVENOUS | Status: AC
  Filled 2021-01-14: qty 5

## 2021-01-14 MED ORDER — BUPIVACAINE LIPOSOME 1.3 % IJ SUSP
INTRAMUSCULAR | Status: AC
  Filled 2021-01-14: qty 20

## 2021-01-14 MED ORDER — PHENYLEPHRINE HCL (PRESSORS) 10 MG/ML IV SOLN
INTRAVENOUS | Status: DC | PRN
  Administered 2021-01-14 (×2): 200 ug via INTRAVENOUS
  Administered 2021-01-14: 100 ug via INTRAVENOUS

## 2021-01-14 MED ORDER — SODIUM CHLORIDE 0.9 % IV SOLN
3.0000 g | Freq: Four times a day (QID) | INTRAVENOUS | Status: DC
  Administered 2021-01-14 – 2021-01-15 (×3): 3 g via INTRAVENOUS
  Filled 2021-01-14: qty 8
  Filled 2021-01-14: qty 3
  Filled 2021-01-14 (×8): qty 8

## 2021-01-14 MED ORDER — FENTANYL CITRATE (PF) 100 MCG/2ML IJ SOLN
INTRAMUSCULAR | Status: AC
  Filled 2021-01-14: qty 2

## 2021-01-14 MED ORDER — KETAMINE HCL 50 MG/ML IJ SOLN
INTRAMUSCULAR | Status: AC
  Filled 2021-01-14: qty 1

## 2021-01-14 MED ORDER — BUPIVACAINE HCL 0.5 % IJ SOLN
INTRAMUSCULAR | Status: DC | PRN
  Administered 2021-01-14 (×2): 10 mL

## 2021-01-14 MED ORDER — ACETAMINOPHEN 10 MG/ML IV SOLN
INTRAVENOUS | Status: DC | PRN
  Administered 2021-01-14: 1000 mg via INTRAVENOUS

## 2021-01-14 MED ORDER — BUPIVACAINE HCL (PF) 0.5 % IJ SOLN
INTRAMUSCULAR | Status: AC
  Filled 2021-01-14: qty 30

## 2021-01-14 MED ORDER — OXYCODONE HCL 5 MG/5ML PO SOLN: 5.0000 mg | Freq: Once | ORAL | Status: DC | PRN

## 2021-01-14 MED ORDER — KETAMINE HCL 10 MG/ML IJ SOLN
INTRAMUSCULAR | Status: DC | PRN
  Administered 2021-01-14: 50 mg via INTRAVENOUS

## 2021-01-14 MED ORDER — LIDOCAINE HCL (CARDIAC) PF 100 MG/5ML IV SOSY
PREFILLED_SYRINGE | INTRAVENOUS | Status: DC | PRN
  Administered 2021-01-14: 100 mg via INTRAVENOUS

## 2021-01-14 MED ORDER — VANCOMYCIN HCL 1000 MG IV SOLR
INTRAVENOUS | Status: DC | PRN
  Administered 2021-01-14: 1000 mg

## 2021-01-14 SURGICAL SUPPLY — 60 items
BLADE MED AGGRESSIVE (BLADE) ×3 IMPLANT
BLADE OSC/SAGITTAL MD 5.5X18 (BLADE) ×3 IMPLANT
BLADE OSCILLATING/SAGITTAL (BLADE)
BLADE SURG 15 STRL LF DISP TIS (BLADE) ×4 IMPLANT
BLADE SURG 15 STRL SS (BLADE) ×2
BLADE SURG MINI STRL (BLADE) ×3 IMPLANT
BLADE SW THK.38XMED LNG THN (BLADE) IMPLANT
BNDG CONFORM 2 STRL LF (GAUZE/BANDAGES/DRESSINGS) ×3 IMPLANT
BNDG CONFORM 3 STRL LF (GAUZE/BANDAGES/DRESSINGS) ×3 IMPLANT
BNDG ELASTIC 4X5.8 VLCR NS LF (GAUZE/BANDAGES/DRESSINGS) ×3 IMPLANT
BNDG ELASTIC 4X5.8 VLCR STR LF (GAUZE/BANDAGES/DRESSINGS) ×3 IMPLANT
BNDG ESMARK 4X12 TAN STRL LF (GAUZE/BANDAGES/DRESSINGS) ×3 IMPLANT
BNDG GAUZE 4.5X4.1 6PLY STRL (MISCELLANEOUS) ×3 IMPLANT
CANISTER SUCT 1200ML W/VALVE (MISCELLANEOUS) ×3 IMPLANT
COVER WAND RF STERILE (DRAPES) ×3 IMPLANT
CUFF TOURN SGL QUICK 12 (TOURNIQUET CUFF) ×3 IMPLANT
CUFF TOURN SGL QUICK 18X4 (TOURNIQUET CUFF) ×3 IMPLANT
DRAPE FLUOR MINI C-ARM 54X84 (DRAPES) ×3 IMPLANT
DURAPREP 26ML APPLICATOR (WOUND CARE) ×3 IMPLANT
ELECT REM PT RETURN 9FT ADLT (ELECTROSURGICAL) ×3
ELECTRODE REM PT RTRN 9FT ADLT (ELECTROSURGICAL) ×2 IMPLANT
GAUZE 4X4 16PLY RFD (DISPOSABLE) ×6 IMPLANT
GAUZE SPONGE 4X4 12PLY STRL (GAUZE/BANDAGES/DRESSINGS) ×3 IMPLANT
GAUZE XEROFORM 1X8 LF (GAUZE/BANDAGES/DRESSINGS) ×3 IMPLANT
GLOVE SURG ENC MOIS LTX SZ7.5 (GLOVE) ×3 IMPLANT
GLOVE SURG UNDER LTX SZ8 (GLOVE) ×3 IMPLANT
GOWN STRL REUS W/ TWL LRG LVL3 (GOWN DISPOSABLE) ×4 IMPLANT
GOWN STRL REUS W/TWL LRG LVL3 (GOWN DISPOSABLE) ×2
HANDPIECE VERSAJET DEBRIDEMENT (MISCELLANEOUS) ×3 IMPLANT
IV NS IRRIG 3000ML ARTHROMATIC (IV SOLUTION) ×3 IMPLANT
KIT STIMULAN RAPID CURE 5CC (Orthopedic Implant) ×3 IMPLANT
KIT TURNOVER KIT A (KITS) ×3 IMPLANT
LABEL OR SOLS (LABEL) ×3 IMPLANT
MANIFOLD NEPTUNE II (INSTRUMENTS) ×3 IMPLANT
NEEDLE FILTER BLUNT 18X 1/2SAF (NEEDLE) ×1
NEEDLE FILTER BLUNT 18X1 1/2 (NEEDLE) ×2 IMPLANT
NEEDLE HYPO 25X1 1.5 SAFETY (NEEDLE) ×9 IMPLANT
NS IRRIG 500ML POUR BTL (IV SOLUTION) ×3 IMPLANT
PACK EXTREMITY ARMC (MISCELLANEOUS) ×3 IMPLANT
PAD ABD DERMACEA PRESS 5X9 (GAUZE/BANDAGES/DRESSINGS) ×6 IMPLANT
PENCIL ELECTRO HAND CTR (MISCELLANEOUS) ×3 IMPLANT
RASP SM TEAR CROSS CUT (RASP) IMPLANT
SOL PREP PVP 2OZ (MISCELLANEOUS) ×3
SOLUTION PREP PVP 2OZ (MISCELLANEOUS) ×2 IMPLANT
STOCKINETTE BIAS CUT 4 980044 (GAUZE/BANDAGES/DRESSINGS) ×3 IMPLANT
STOCKINETTE STRL 6IN 960660 (GAUZE/BANDAGES/DRESSINGS) ×3 IMPLANT
STRIP CLOSURE SKIN 1/4X4 (GAUZE/BANDAGES/DRESSINGS) ×3 IMPLANT
SUT ETHILON 2 0 FS 18 (SUTURE) ×18 IMPLANT
SUT ETHILON 3-0 FS-10 30 BLK (SUTURE) ×15
SUT ETHILON 4-0 (SUTURE) ×1
SUT ETHILON 4-0 FS2 18XMFL BLK (SUTURE) ×2
SUT MON AB 3-0 SH 27 (SUTURE) ×12 IMPLANT
SUT VIC AB 3-0 SH 27 (SUTURE) ×1
SUT VIC AB 3-0 SH 27X BRD (SUTURE) ×2 IMPLANT
SUT VIC AB 4-0 FS2 27 (SUTURE) ×3 IMPLANT
SUTURE EHLN 3-0 FS-10 30 BLK (SUTURE) ×10 IMPLANT
SUTURE ETHLN 4-0 FS2 18XMF BLK (SUTURE) ×2 IMPLANT
SWAB CULTURE AMIES ANAERIB BLU (MISCELLANEOUS) ×3 IMPLANT
SYR 10ML LL (SYRINGE) ×6 IMPLANT
SYR 3ML LL SCALE MARK (SYRINGE) ×3 IMPLANT

## 2021-01-14 NOTE — Progress Notes (Signed)
History and Physical Interval Note:  01/14/2021 2:42 PM  [name] [name]  has presented today for surgery, with the diagnosis of diabetic foot ulcers, osteomyelitis.  The various methods of treatment have been discussed with the patient and family. After consideration of risks, benefits and other options for treatment, the patient has consented to   Procedure(s): AMPUTATION RAY (Right) IRRIGATION AND DEBRIDEMENT WOUND (Left) as a surgical intervention.  The patient's history has been reviewed, patient examined, no change in status, stable for surgery.  I have reviewed the patient's chart and labs.  Questions were answered to the patient's satisfaction.     Edwin Cap

## 2021-01-14 NOTE — Anesthesia Preprocedure Evaluation (Signed)
Anesthesia Evaluation  Patient identified by MRN, date of birth, ID band Patient awake    Reviewed: Allergy & Precautions, H&P , NPO status , Patient's Chart, lab work & pertinent test results  History of Anesthesia Complications Negative for: history of anesthetic complications  Airway Mallampati: II  TM Distance: >3 FB Neck ROM: full    Dental  (+) Missing   Pulmonary neg shortness of breath, Current Smoker and Patient abstained from smoking.,    Pulmonary exam normal        Cardiovascular Exercise Tolerance: Good (-) angina+ Peripheral Vascular Disease  (-) Past MI and (-) DOE Normal cardiovascular exam     Neuro/Psych negative neurological ROS  negative psych ROS   GI/Hepatic negative GI ROS, Neg liver ROS,   Endo/Other  diabetes, Type 2, Insulin Dependent  Renal/GU negative Renal ROS  negative genitourinary   Musculoskeletal   Abdominal   Peds  Hematology negative hematology ROS (+)   Anesthesia Other Findings Past Medical History: No date: Anemia of chronic disease No date: Diabetes mellitus without complication (HCC) No date: Tobacco use disorder No date: Toe gangrene [facility])  Past Surgical History: 09/22/2016: ACHILLES TENDON SURGERY; Right     Comment:  Procedure: ACHILLES TENDON LENGTHENING;  Surgeon: Gwyneth Revels, DPM;  Location: [facility] ORS;  Service: Podiatry;                Laterality: Right; 07/12/2015: AMPUTATION TOE; Left     Comment:  Procedure: AMPUTATION TOE;  Surgeon: Gwyneth Revels, DPM;              Location: [facility] OR[facility];  Service: Podiatry;  Laterality:               Left; 09/22/2016: AMPUTATION TOE; Right     Comment:  Procedure: AMPUTATION TOE;  Surgeon: Gwyneth Revels, DPM;              Location: [facility] ORS;  Service: Podiatry;  Laterality:               Right; 07/12/2015: IRRIGATION AND DEBRIDEMENT FOOT; Left     Comment:  Procedure: IRRIGATION AND DEBRIDEMENT FOOT;  Surgeon:                Gwyneth Revels, DPM;  Location: [facility] ORS;  Service:               Podiatry;  Laterality: Left; 09/06/2015: IRRIGATION AND DEBRIDEMENT FOOT; Left     Comment:  Procedure: IRRIGATION AND DEBRIDEMENT FOOT;  Surgeon:               Recardo Evangelist, DPM;  Location: [facility] ORS;  Service:               Podiatry;  Laterality: Left;  BMI    Body Mass Index: 28.61 kg/m      Reproductive/Obstetrics negative OB ROS                             Anesthesia Physical Anesthesia Plan  ASA: III  Anesthesia Plan: General   Post-op Pain Management:    Induction: Intravenous  PONV Risk Score and Plan: Propofol infusion and TIVA  Airway Management Planned: Natural Airway and Nasal Cannula  Additional Equipment:   Intra-op Plan:   Post-operative Plan:   Informed Consent: I have reviewed the patients History and Physical, chart, labs and discussed the procedure  including the risks, benefits and alternatives for the proposed anesthesia with the patient or authorized representative who has indicated his/her understanding and acceptance.     Dental Advisory Given  Plan Discussed with: Anesthesiologist, CRNA and Surgeon  Anesthesia Plan Comments: (Patient consented for risks of anesthesia including but not limited to:  - adverse reactions to medications - risk of airway placement if required - damage to eyes, teeth, lips or other oral mucosa - nerve damage due to positioning  - sore throat or hoarseness - Damage to heart, brain, nerves, lungs, other parts of body or loss of life  Patient voiced understanding.)        Anesthesia Quick Evaluation

## 2021-01-14 NOTE — Plan of Care (Signed)
  Problem: Education: Goal: Knowledge of General Education information will improve Description: Including pain rating scale, medication(s)/side effects and non-pharmacologic comfort measures Outcome: Progressing   Problem: Clinical Measurements: Goal: Ability to maintain clinical measurements within normal limits will improve Outcome: Progressing Goal: Will remain free from infection Outcome: Progressing Goal: Diagnostic test results will improve Outcome: Progressing Goal: Respiratory complications will improve Outcome: Progressing Goal: Cardiovascular complication will be avoided Outcome: Progressing   Problem: Nutrition: Goal: Adequate nutrition will be maintained Outcome: Progressing   Problem: Elimination: Goal: Will not experience complications related to bowel motility Outcome: Progressing Goal: Will not experience complications related to urinary retention Outcome: Progressing   Problem: Safety: Goal: Ability to remain free from injury will improve Outcome: Progressing   

## 2021-01-14 NOTE — Progress Notes (Signed)
PROGRESS NOTE    [name] [name]  LGX:[id number] DOB: 19-Sep-1960 DOA: 01/11/2021 PCP: Patient, No Pcp Per (Inactive)   Chief Complaint.  Bilateral foot wounds. Brief Narrative:  [name] a 60 y.o.malewith medical history significant fordiabetes mellitus, nicotine dependence, peripheral vascular disease status post amputation of the left great toe and second toe on the right foot,alcohol dependence who presents to the emergency room for evaluation of wounds to both feet.Patient has been seen by podiatry, MRI of bilateral foot showed early osteomyelitis in both side. Patient initially placed on Rocephin and Flagyl, then discontinued following recommendation from podiatry.   Assessment & Plan:   Principal Problem:   Diabetic foot ulcer (HCC) Active Problems:   Hypokalemia   Anemia of chronic disease   Acute osteomyelitis of toe of right foot (HCC)   Tobacco use disorder   Peripheral vascular disease (HCC)   Diabetic foot infection (HCC)   #1.  Bilateral diabetic foot ulcers with osteomyelitis. Type 2 diabetes relative controlled. MRI showed bilateral osteomyelitis, with the right third MPT septic joint.  Patient has gone to surgery after I seeing him today this morning. I will start antibiotics with vancomycin and Unasyn after surgery.  #2.  Anemia of chronic disease. Stable  3.  Hypokalemia Improved.  Recheck lab tomorrow.    DVT prophylaxis: Lovenox Code Status: Full Family Communication:  Disposition Plan:  .   Status is: Inpatient  Remains inpatient appropriate because:Inpatient level of care appropriate due to severity of illness   Dispo: The patient is from: Home              Anticipated d/c is to: Home              Patient currently is not medically stable to d/c.   Difficult to place patient No        I/O last 3 completed shifts: In: 240 [P.O.:240] Out: 3200 [Urine:3200] No intake/output data recorded.     Consultants:    Podiatry  Procedures:   Antimicrobials: None Subjective: Patient doing well today, no pain in bilateral feet. No fever chills No nausea vomiting. No short of breath or cough.  Objective: Vitals:   01/14/21 0455 01/14/21 0749 01/14/21 1107 01/14/21 1414  BP: 139/78 140/89 126/75 131/84  Pulse: 87 79 82 83  Resp: 17 18 16 16   Temp: 98.7 F (37.1 C) 98.7 F (37.1 C) 98.2 F (36.8 C) 98.1 F (36.7 C)  TempSrc:  Oral Oral Oral  SpO2: 100% 99% 99% 100%  Weight:      Height:        Intake/Output Summary (Last 24 hours) at 01/14/2021 1500 Last data filed at 01/14/2021 0443 Gross per 24 hour  Intake --  Output 1300 ml  Net -1300 ml   Filed Weights   01/11/21 1341  Weight: 106.6 kg    Examination:  General exam: Appears calm and comfortable  Respiratory system: Clear to auscultation. Respiratory effort normal. Cardiovascular system: S1 & S2 heard, RRR. No JVD, murmurs, rubs, gallops or clicks. No pedal edema. Gastrointestinal system: Abdomen is nondistended, soft and nontender. No organomegaly or masses felt. Normal bowel sounds heard. Central nervous system: Alert and oriented. No focal neurological deficits. Extremities: Bilateral foot ulcer Skin: No rashes, lesions or ulcers Psychiatry: Judgement and insight appear normal. Mood & affect appropriate.     Data Reviewed: I have personally reviewed following labs and imaging studies  CBC: Recent Labs  Lab 01/11/21 1235 01/12/21 0652  WBC 8.5  6.5  NEUTROABS 6.3  --   HGB 13.9 12.6*  HCT 40.7 36.1*  MCV 101.2* 100.8*  PLT 227 235   Basic Metabolic Panel: Recent Labs  Lab 01/11/21 1235 01/12/21 0652  NA 134* 138  K 3.0* 3.9  CL 100 104  CO2 22 25  GLUCOSE 142* 121*  BUN <5* 5*  CREATININE 0.63 0.70  CALCIUM 8.9 8.8*  MG 2.1  --    GFR: Estimated Creatinine Clearance: 128.2 mL/min (by C-G formula based on SCr of 0.7 mg/dL). Liver Function Tests: Recent Labs  Lab 01/11/21 1235  AST 18  ALT 12   ALKPHOS 70  BILITOT 0.7  PROT 7.7  ALBUMIN 3.6   No results for input(s): LIPASE, AMYLASE in the last 168 hours. No results for input(s): AMMONIA in the last 168 hours. Coagulation Profile: Recent Labs  Lab 01/11/21 1235  INR 1.0   Cardiac Enzymes: No results for input(s): CKTOTAL, CKMB, CKMBINDEX, TROPONINI in the last 168 hours. BNP (last 3 results) No results for input(s): PROBNP in the last 8760 hours. HbA1C: Recent Labs    01/11/21 1701  HGBA1C 5.0   CBG: Recent Labs  Lab 01/13/21 1627 01/13/21 2140 01/14/21 0750 01/14/21 1108 01/14/21 1412  GLUCAP 115* 113* 117* 106* 88   Lipid Profile: No results for input(s): CHOL, HDL, LDLCALC, TRIG, CHOLHDL, LDLDIRECT in the last 72 hours. Thyroid Function Tests: No results for input(s): TSH, T4TOTAL, FREET4, T3FREE, THYROIDAB in the last 72 hours. Anemia Panel: No results for input(s): VITAMINB12, FOLATE, FERRITIN, TIBC, IRON, RETICCTPCT in the last 72 hours. Sepsis Labs: Recent Labs  Lab 01/11/21 1235 01/11/21 1604  LATICACIDVEN 2.6* 2.1*    Recent Results (from the past 240 hour(s))  Blood culture (routine single)     Status: None (Preliminary result)   Collection Time: 01/11/21 12:48 PM   Specimen: BLOOD  Result Value Ref Range Status   Specimen Description BLOOD BLOOD LEFT ARM  Final   Special Requests   Final    BOTTLES DRAWN AEROBIC AND ANAEROBIC Blood Culture results may not be optimal due to an inadequate volume of blood received in culture bottles   Culture   Final    NO GROWTH 3 DAYS Performed at [facility], [address] [place], [place] [address]    Report Status PENDING  Incomplete  SARS CORONAVIRUS 2 (TAT 6-24 HRS) Nasopharyngeal Nasopharyngeal Swab     Status: None   Collection Time: 01/11/21  2:30 PM   Specimen: Nasopharyngeal Swab  Result Value Ref Range Status   SARS Coronavirus 2 NEGATIVE NEGATIVE Final    Comment: (NOTE) SARS-CoV-2 target nucleic acids are NOT  DETECTED.  The SARS-CoV-2 RNA is generally detectable in upper and lower respiratory specimens during the acute phase of infection. Negative results do not preclude SARS-CoV-2 infection, do not rule out co-infections with other pathogens, and should not be used as the sole basis for treatment or other patient management decisions. Negative results must be combined with clinical observations, patient history, and epidemiological information. The expected result is Negative.  Fact Sheet for Patients: HairSlick.no  Fact Sheet for Healthcare Providers: quierodirigir.com  This test is not yet approved or cleared by the [place] FDA and  has been authorized for detection and/or diagnosis of SARS-CoV-2 by FDA under an Emergency Use Authorization (EUA). This EUA will remain  in effect (meaning this test can be used) for the duration of the COVID-19 declaration under Se ction 564(b)(1) of the Act, 21  U.S.C. section 360bbb-3(b)(1), unless the authorization is terminated or revoked sooner.  Performed at [facility], [address] [place], [place] [address]   Urine culture     Status: Abnormal   Collection Time: 01/11/21  4:08 PM   Specimen: Urine, Random  Result Value Ref Range Status   Specimen Description   Final    URINE, RANDOM Performed at [facility], [address] [place], [place] [address]    Special Requests   Final    NONE Performed at [facility], [address] [place], [place] [address]    Culture (A)  Final    <10,000 COLONIES/mL INSIGNIFICANT GROWTH Performed at [facility], [address] [place], [place] [address]    Report Status 01/13/2021 FINAL  Final  Surgical PCR screen     Status: None   Collection Time: 01/14/21  8:24 AM   Specimen: Nasal Mucosa; Nasal Swab  Result Value Ref Range Status   MRSA, PCR NEGATIVE NEGATIVE Final   Staphylococcus aureus NEGATIVE  NEGATIVE Final    Comment: (NOTE) The Xpert SA Assay (FDA approved for NASAL specimens in patients 82 years of age and older), is one component of a comprehensive surveillance program. It is not intended to diagnose infection nor to guide or monitor treatment. Performed at [facility], [address], [place], [place] [address]          Radiology Studies: US ARTERIAL ABI (SCREENING LOWER EXTREMITY)  Result Date: 01/12/2021 CLINICAL DATA:  60 year old male with a history of peripheral vascular disease and prior amputation EXAM: NONINVASIVE PHYSIOLOGIC VASCULAR STUDY OF BILATERAL LOWER EXTREMITIES TECHNIQUE: Evaluation of both lower extremities was performed at rest, including calculation of ankle-brachial indices, multiple segmental pressure evaluation, segmental Doppler and segmental pulse volume recording. COMPARISON:  None. FINDINGS: Right ABI:  1.25 Left ABI:  1.14 Right Lower Extremity: Segmental Doppler at the right ankle demonstrates triphasic posterior tibial artery and monophasic dorsalis pedis Left Lower Extremity: Segmental Doppler at the left ankle demonstrates triphasic posterior tibial artery and monophasic dorsalis pedis IMPRESSION: Resting ABI of the bilateral lower extremities within normal limits, though potentially elevated. Segmental exam demonstrates waveforms of the bilateral posterior tibial arteries maintained with developing occlusive disease in the bilateral anterior tibial distribution. Signed, Yvone Neu. Reyne Dumas, RPVI [facility] Electronically Signed   By: Gilmer Mor D.O.   On: 01/12/2021 16:37        Scheduled Meds: . [MAR Hold] vitamin C  250 mg Oral Daily  . [MAR Hold] aspirin EC  81 mg Oral Daily  . [MAR Hold] atorvastatin  40 mg Oral Daily  . [MAR Hold] enoxaparin (LOVENOX) injection  40 mg Subcutaneous Q24H  . [MAR Hold] insulin aspart  0-15 Units Subcutaneous TID WC  . [MAR  Hold] nicotine  14 mg Transdermal Daily  . [MAR Hold] nutrition supplement (JUVEN)  1 packet Oral BID BM  . [MAR Hold] Ensure Max Protein  11 oz Oral BID   Continuous Infusions: . sodium chloride 100 mL/hr at 01/14/21 1416     LOS: 3 days    Time spent: 27 minutes    Marrion Coy, MD [facility]   To contact the attending provider between 7A-7P or the covering provider during after hours 7P-7A, please log into the web site [email or web address] and access using universal [employer] password for that web site. If you do not have the password, please call the hospital  operator.  01/14/2021, 3:00 PM

## 2021-01-14 NOTE — Anesthesia Postprocedure Evaluation (Signed)
Anesthesia Post Note  Patient: [name] [name]  Procedure(s) Performed: AMPUTATION RAY (Right Foot) IRRIGATION AND DEBRIDEMENT WOUND (Bilateral Foot)  Patient location during evaluation: PACU Anesthesia Type: General Level of consciousness: awake and alert Pain management: pain level controlled Vital Signs Assessment: post-procedure vital signs reviewed and stable Respiratory status: spontaneous breathing, nonlabored ventilation, respiratory function stable and patient connected to nasal cannula oxygen Cardiovascular status: blood pressure returned to baseline and stable Postop Assessment: no apparent nausea or vomiting Anesthetic complications: no   No complications documented.   Last Vitals:  Vitals:   01/14/21 1730 01/14/21 1754  BP: 117/80 (!) 124/96  Pulse: 77 78  Resp: 15 16  Temp:  36.4 C  SpO2: 94% 97%    Last Pain:  Vitals:   01/14/21 1730  TempSrc:   PainSc: 0-No pain                 Lenard Simmer

## 2021-01-14 NOTE — Transfer of Care (Signed)
Immediate Anesthesia Transfer of Care Note  Patient: [name] [name]  Procedure(s) Performed: AMPUTATION RAY (Right Foot) IRRIGATION AND DEBRIDEMENT WOUND (Bilateral Foot)  Patient Location: PACU  Anesthesia Type:General  Level of Consciousness: awake, drowsy and patient cooperative  Airway & Oxygen Therapy: Patient Spontanous Breathing and Patient connected to face mask oxygen  Post-op Assessment: Report given to RN and Post -op Vital signs reviewed and stable  Post vital signs: Reviewed and stable  Last Vitals:  Vitals Value Taken Time  BP 112/84 01/14/21 1700  Temp    Pulse 80 01/14/21 1702  Resp 21 01/14/21 1702  SpO2 92 % 01/14/21 1702  Vitals shown include unvalidated device data.  Last Pain:  Vitals:   01/14/21 1414  TempSrc: Oral  PainSc: 0-No pain         Complications: No complications documented.

## 2021-01-14 NOTE — Op Note (Signed)
Patient Name: [name] [name] DOB: [birth year]  MRN: [id number]   Date of Service: 01/11/2021 - 01/14/2021  Surgeon: Dr. Lanae Crumbly, DPM Assistants: None Pre-operative Diagnosis:  #1 chronic diabetic neuropathic foot ulcers bilateral #2 osteomyelitis left first metatarsal #3 osteomyelitis right third metatarsal and proximal phalanx #4 septic arthritis right third metatarsophalangeal joint Post-operative Diagnosis:  #1 chronic diabetic neuropathic foot ulcers bilateral #2 osteomyelitis left first metatarsal #3 osteomyelitis right third metatarsal and proximal phalanx #4 septic arthritis right third metatarsophalangeal joint Procedures:  1) partial resection metatarsal left first  2) incision bone cortex right foot with resection of third metatarsal phalangeal joint  3) insertion biodegradable drug delivery implant bilateral foot Pathology/Specimens: ID Type Source Tests Collected by Time Destination  1 : LEFT FIRST METATARSAL Amputation Toe, Left SURGICAL PATHOLOGY Criselda Peaches, DPM 01/14/2021 1535   2 : RIGHT THIRD METATARSAL Amputation Toe, Right SURGICAL PATHOLOGY Criselda Peaches, DPM 01/14/2021 1550   A : LEFT FIRST METATARSAL Amputation Toe, Left AEROBIC/ANAEROBIC CULTURE W GRAM STAIN (SURGICAL/DEEP WOUND) Criselda Peaches, DPM 01/14/2021 1535   B : RIGHT THIRD METATARSAL Amputation Toe, Right AEROBIC/ANAEROBIC CULTURE W GRAM STAIN (SURGICAL/DEEP WOUND) Criselda Peaches, [facility] 01/14/2021 1550    Anesthesia: monitored anesthesia care Hemostasis:  Total Tourniquet Time Documented: Calf (Left) - 30 minutes Total: Calf (Left) - 30 minutes  Calf (Right) - 20 minutes Total: Calf (Right) - 20 minutes  Estimated Blood Loss: 50 mL Materials:  Implant Name Type Inv. Item Serial No. Manufacturer Lot No. LRB No. Used Action  KIT STIMULAN RAPID CURE 5CC - SN/A Orthopedic Implant KIT STIMULAN RAPID CURE 5CC N/A [employer] [id number] Bilateral 1 Implanted   Medications: 10cc each  foot 0.25% bupivacaine plain Complications: none  Indications for Procedure:  This is a 60 y.o. male with a history of type 2 diabetes and chronic neuropathic foot ulcerations.  He has a history of previous toe and partial ray amputations on both feet.  Preoperative MRI revealed septic arthritis of the right third metatarsophalangeal joint and osteitis of the residual first metatarsal stump.  Operative treatment was recommended.   Procedure in Detail: Patient was identified in pre-operative holding area. Formal consent was signed and the bilateral lower extremity was marked. Patient was brought back to the operating room. Anesthesia was induced. The extremity was prepped and draped in the usual sterile fashion. Timeout was taken to confirm patient name, laterality, and procedure prior to incision.  The tourniquet was then inflated.  Attention was then directed to the left foot where a dorsal linear incision was made over the first metatarsal stump.  Dissection was carried deep to the level of the metatarsal and an elevator was used to raise the soft tissues.  Additional resection was completed in a transmetatarsal amputation fashion.  A significant mount of heterotopic ossification was noted.  This distal metatarsal portion was taken from the field and sent for pathology and a sample of it was taken as a tissue culture for antibiotic therapy guidance.  I then made a semielliptical incision over the plantar neuropathic ulceration to excise it in a full-thickness fashion.  The wound was irrigated thoroughly with normal sterile saline with a pulse irrigator.  A similar procedure was then performed from a dorsal linear approach over the third metatarsal phalangeal joint with resection of the distal third metatarsal and proximal phalanx of the third toe.  The third toe was not amputated.  The plantar neuropathic ulceration was then also excised in semielliptical manner.  After complete irrigation of all wounds  hemostasis was achieved and the tourniquets were deflated.  Absorbable stimulan antibiotic beads with 1 g vancomycin and 160 mg of gentamicin were then placed into the wounds.  They are closed in layers with 3-0 Monocryl and 2 oh and 3 oh nylons.  The foot was then dressed with Xeroform, 4 x 4's, ABD pads and Kerlix and an Ace wrap. Patient tolerated the procedure well.   Disposition: Following a period of post-operative monitoring, patient will be transferred to the floor for further monitoring.

## 2021-01-14 NOTE — Progress Notes (Signed)
Pharmacy Antibiotic Note  [name] [name] is a 60 y.o. male with PMH of DM, PVD, s/p amputation of the left great toe and second toe on the right foot admitted on 01/11/2021 with cellulitis of both feet now POD 0 I&D by podiatry. A wound culture was collected which is now pending. Pharmacy has been consulted for Unasyn and vancomycin dosing. He received a 1000 mg dose of IV vancomycin in the ED.  Plan:  1) start Unasyn 3 grams IV every 6 hours  2) start vancomycin 1250 mg IV Q 8 hrs  Goal AUC 400-550  Expected AUC: 479  SCr used: 0.80 (rounded up)  Ke: 0.102 h-1, T1/2: 6.8 h  Css (calculated): 29.2 / 15.1 mcg/mL  Daily renal function assessment while on IV vancomycin  Follow wound culture for potential narrowing of antibiotics  Height: 6\' 4"  (193 cm) Weight: 106.6 kg (235 lb) IBW/kg (Calculated) : 86.8  Temp (24hrs), Avg:98.2 F (36.8 C), Min:97.5 F (36.4 C), Max:98.7 F (37.1 C)  Recent Labs  Lab 01/11/21 1235 01/11/21 1604 01/12/21 0652  WBC 8.5  --  6.5  CREATININE 0.63  --  0.70  LATICACIDVEN 2.6* 2.1*  --     Estimated Creatinine Clearance: 128.2 mL/min (by C-G formula based on SCr of 0.7 mg/dL).    No Known Allergies  Antimicrobials this admission: vancomycin 05/20 >>  Unasyn 05/20 >>   Microbiology results: 05/17 BCx: NG x 3 days 05/17 UCx: <10k CFU  05/20 WCx: pending 05/17 SARS CoV-2: negative 05/20 MRSA PCR: negative  Thank you for allowing pharmacy to be a part of this patient's care.  6/20 01/14/2021 4:15 PM

## 2021-01-14 NOTE — Brief Op Note (Signed)
01/14/2021  5:02 PM  PATIENT:  [name] [name]  60 y.o. male  PRE-OPERATIVE DIAGNOSIS:  Osteomyelitis, neuropathic foot ulcer  POST-OPERATIVE DIAGNOSIS:  osteomyelitis, neuropathic foot ulcer  PROCEDURE:  Procedure(s): AMPUTATION RAY (Right) IRRIGATION AND DEBRIDEMENT WOUND (Bilateral)  SURGEON:  Surgeon(s) and Role:    * Magali Bray, Stephan Minister, DPM - Primary  PHYSICIAN ASSISTANT:   ASSISTANTS: none   ANESTHESIA:   MAC  EBL:  50 mL   BLOOD ADMINISTERED:none  DRAINS: none   LOCAL MEDICATIONS USED:  BUPIVICAINE  10cc each foot  SPECIMEN:  Micro and pathology, L 1st met bone bx and culture, R 3rd met bone bx and culture  DISPOSITION OF SPECIMEN:  PATHOLOGY  COUNTS:  YES  TOURNIQUET:   Total Tourniquet Time Documented: Calf (Left) - 30 minutes Total: Calf (Left) - 30 minutes  Calf (Right) - 20 minutes Total: Calf (Right) - 20 minutes   DICTATION: .Note written in EPIC  PLAN OF CARE: Admit to inpatient   PATIENT DISPOSITION:  PACU - hemodynamically stable.   Delay start of Pharmacological VTE agent (>24hrs) due to surgical blood loss or risk of bleeding: no

## 2021-01-15 LAB — CBC WITH DIFFERENTIAL/PLATELET
Abs Immature Granulocytes: 0.05 10*3/uL (ref 0.00–0.07)
Basophils Absolute: 0.1 10*3/uL (ref 0.0–0.1)
Basophils Relative: 1 %
Eosinophils Absolute: 0.3 10*3/uL (ref 0.0–0.5)
Eosinophils Relative: 4 %
HCT: 36.6 % — ABNORMAL LOW (ref 39.0–52.0)
Hemoglobin: 12.8 g/dL — ABNORMAL LOW (ref 13.0–17.0)
Immature Granulocytes: 1 %
Lymphocytes Relative: 8 %
Lymphs Abs: 0.8 10*3/uL (ref 0.7–4.0)
MCH: 35.1 pg — ABNORMAL HIGH (ref 26.0–34.0)
MCHC: 35 g/dL (ref 30.0–36.0)
MCV: 100.3 fL — ABNORMAL HIGH (ref 80.0–100.0)
Monocytes Absolute: 0.8 10*3/uL (ref 0.1–1.0)
Monocytes Relative: 9 %
Neutro Abs: 7 10*3/uL (ref 1.7–7.7)
Neutrophils Relative %: 77 %
Platelets: 331 10*3/uL (ref 150–400)
RBC: 3.65 MIL/uL — ABNORMAL LOW (ref 4.22–5.81)
RDW: 11.9 % (ref 11.5–15.5)
WBC: 9 10*3/uL (ref 4.0–10.5)
nRBC: 0 % (ref 0.0–0.2)

## 2021-01-15 LAB — GLUCOSE, CAPILLARY
Glucose-Capillary: 121 mg/dL — ABNORMAL HIGH (ref 70–99)
Glucose-Capillary: 92 mg/dL (ref 70–99)
Glucose-Capillary: 98 mg/dL (ref 70–99)
Glucose-Capillary: 119 mg/dL — ABNORMAL HIGH (ref 70–99)

## 2021-01-15 LAB — BASIC METABOLIC PANEL
Anion gap: 8 (ref 5–15)
BUN: 15 mg/dL (ref 8–23)
CO2: 25 mmol/L (ref 22–32)
Calcium: 9.1 mg/dL (ref 8.9–10.3)
Chloride: 103 mmol/L (ref 98–111)
Creatinine, Ser: 0.69 mg/dL (ref 0.61–1.24)
GFR, Estimated: >60 mL/min (ref >60)
Glucose, Bld: 120 mg/dL — ABNORMAL HIGH (ref 70–99)
Potassium: 4.3 mmol/L (ref 3.5–5.1)
Sodium: 136 mmol/L (ref 135–145)

## 2021-01-15 LAB — MAGNESIUM: Magnesium: 2 mg/dL (ref 1.7–2.4)

## 2021-01-15 MED ORDER — VANCOMYCIN HCL 1250 MG/250ML IV SOLN
1250.0000 mg | Freq: Three times a day (TID) | INTRAVENOUS | Status: DC
  Administered 2021-01-15 – 2021-01-16 (×3): 1250 mg via INTRAVENOUS
  Filled 2021-01-15 (×9): qty 250

## 2021-01-15 MED ORDER — SODIUM CHLORIDE 0.9 % IV SOLN
3.0000 g | Freq: Four times a day (QID) | INTRAVENOUS | Status: DC
  Administered 2021-01-15 – 2021-01-17 (×8): 3 g via INTRAVENOUS
  Filled 2021-01-15 (×3): qty 8
  Filled 2021-01-15: qty 3
  Filled 2021-01-15 (×5): qty 8
  Filled 2021-01-15: qty 3
  Filled 2021-01-15: qty 8
  Filled 2021-01-15 (×2): qty 3

## 2021-01-15 NOTE — Progress Notes (Signed)
PROGRESS NOTE    [name] [name]  NWG:[id number] DOB: 01-May-1961 DOA: 01/11/2021 PCP: Patient, No Pcp Per (Inactive)   Chief complaint.  Bilateral foot wounds. Brief Narrative:  [name] a 60 y.o.malewith medical history significant fordiabetes mellitus, nicotine dependence, peripheral vascular disease status post amputation of the left great toe and second toe on the right foot,alcohol dependence who presents to the emergency room for evaluation of wounds to both feet.Patient has been seen by podiatry, MRI of bilateral foot showed early osteomyelitis in both side. Patientinitially placed on Rocephin and Flagyl, then discontinued following recommendation from podiatry. 5/20.  Following surgery procedures were performed: 1) partial resection metatarsal left first             2) incision bone cortex right foot with resection of third metatarsal phalangeal joint             3) insertion biodegradable drug delivery implant bilateral foot. Culture sent out, vancomycin and Unasyn started postop.  Assessment & Plan:   Principal Problem:   Diabetic foot ulcer (HCC) Active Problems:   Hypokalemia   Anemia of chronic disease   Acute osteomyelitis of toe of right foot (HCC)   Tobacco use disorder   Peripheral vascular disease (HCC)   Diabetic foot infection (HCC) Sepsis is ruled out.   1.  Bilateral diabetic foot ulcer with osteomyelitis. Type 2 diabetes. S/p surgery as above.  Antibiotic started with Unasyn and vancomycin. Surgical culture still pending.  Anticipating discharge on Monday after review culture results.  Patient may need a PICC line for IV antibiotics per  2.  Anemia of chronic disease Stable.  3.  Hypokalemia Resolved.  #4.  Initial lactic acidosis. Might be from dehydration, patient did not meet sepsis criteria at time admission.    DVT prophylaxis: Lovenox Code Status: Full Family Communication:  Disposition Plan:  .   Status is:  Inpatient  Remains inpatient appropriate because:Inpatient level of care appropriate due to severity of illness   Dispo: The patient is from: Home              Anticipated d/c is to: Home              Patient currently is not medically stable to d/c.   Difficult to place patient No        I/O last 3 completed shifts: In: 3373.3 [I.V.:2673.3; IV Piggyback:700] Out: 3150 [Urine:3100; Blood:50] Total I/O In: 240 [P.O.:240] Out: -      Consultants:   Podiatry  Procedures: Foot surgery.  Antimicrobials: Unasyn and vancomycin  Subjective: Patient doing well today postop.  No significant pain. Denies any short of breath or cough. No dysuria hematuria pain No fever or chills.  Objective: Vitals:   01/14/21 2103 01/14/21 2333 01/15/21 0408 01/15/21 0817  BP: 135/77 130/80 119/80 129/69  Pulse: 85 81 91 93  Resp: 18 18 18 20   Temp: (!) 97.5 F (36.4 C) 98 F (36.7 C) 98.2 F (36.8 C) 98.9 F (37.2 C)  TempSrc:  Oral Oral Oral  SpO2: 99% 100% 100% 98%  Weight:      Height:        Intake/Output Summary (Last 24 hours) at 01/15/2021 1056 Last data filed at 01/15/2021 1051 Gross per 24 hour  Intake 3613.33 ml  Output 1850 ml  Net 1763.33 ml   Filed Weights   01/11/21 1341  Weight: 106.6 kg    Examination:  General exam: Appears calm and comfortable  Respiratory system:  Clear to auscultation. Respiratory effort normal. Cardiovascular system: S1 & S2 heard, RRR. No JVD, murmurs, rubs, gallops or clicks. No pedal edema. Gastrointestinal system: Abdomen is nondistended, soft and nontender. No organomegaly or masses felt. Normal bowel sounds heard. Central nervous system: Alert and oriented. No focal neurological deficits. Extremities: Bilateral foot ulcers. Skin: No rashes, lesions or ulcers Psychiatry: Judgement and insight appear normal. Mood & affect appropriate.     Data Reviewed: I have personally reviewed following labs and imaging  studies  CBC: Recent Labs  Lab 01/11/21 1235 01/12/21 0652 01/15/21 0609  WBC 8.5 6.5 9.0  NEUTROABS 6.3  --  7.0  HGB 13.9 12.6* 12.8*  HCT 40.7 36.1* 36.6*  MCV 101.2* 100.8* 100.3*  PLT 227 235 331   Basic Metabolic Panel: Recent Labs  Lab 01/11/21 1235 01/12/21 0652 01/15/21 0609  NA 134* 138 136  K 3.0* 3.9 4.3  CL 100 104 103  CO2 22 25 25   GLUCOSE 142* 121* 120*  BUN <5* 5* 15  CREATININE 0.63 0.70 0.69  CALCIUM 8.9 8.8* 9.1  MG 2.1  --  2.0   GFR: Estimated Creatinine Clearance: 128.2 mL/min (by C-G formula based on SCr of 0.69 mg/dL). Liver Function Tests: Recent Labs  Lab 01/11/21 1235  AST 18  ALT 12  ALKPHOS 70  BILITOT 0.7  PROT 7.7  ALBUMIN 3.6   No results for input(s): LIPASE, AMYLASE in the last 168 hours. No results for input(s): AMMONIA in the last 168 hours. Coagulation Profile: Recent Labs  Lab 01/11/21 1235  INR 1.0   Cardiac Enzymes: No results for input(s): CKTOTAL, CKMB, CKMBINDEX, TROPONINI in the last 168 hours. BNP (last 3 results) No results for input(s): PROBNP in the last 8760 hours. HbA1C: No results for input(s): HGBA1C in the last 72 hours. CBG: Recent Labs  Lab 01/14/21 1108 01/14/21 1412 01/14/21 1702 01/14/21 2215 01/15/21 0818  GLUCAP 106* 88 126* 107* 121*   Lipid Profile: No results for input(s): CHOL, HDL, LDLCALC, TRIG, CHOLHDL, LDLDIRECT in the last 72 hours. Thyroid Function Tests: No results for input(s): TSH, T4TOTAL, FREET4, T3FREE, THYROIDAB in the last 72 hours. Anemia Panel: No results for input(s): VITAMINB12, FOLATE, FERRITIN, TIBC, IRON, RETICCTPCT in the last 72 hours. Sepsis Labs: Recent Labs  Lab 01/11/21 1235 01/11/21 1604  LATICACIDVEN 2.6* 2.1*    Recent Results (from the past 240 hour(s))  Blood culture (routine single)     Status: None (Preliminary result)   Collection Time: 01/11/21 12:48 PM   Specimen: BLOOD  Result Value Ref Range Status   Specimen Description BLOOD  BLOOD LEFT ARM  Final   Special Requests   Final    BOTTLES DRAWN AEROBIC AND ANAEROBIC Blood Culture results may not be optimal due to an inadequate volume of blood received in culture bottles   Culture   Final    NO GROWTH 4 DAYS Performed at [facility], [address], [place], [place]    Report Status PENDING  Incomplete  SARS CORONAVIRUS 2 (TAT 6-24 HRS) Nasopharyngeal Nasopharyngeal Swab     Status: None   Collection Time: 01/11/21  2:30 PM   Specimen: Nasopharyngeal Swab  Result Value Ref Range Status   SARS Coronavirus 2 NEGATIVE NEGATIVE Final    Comment: (NOTE) SARS-CoV-2 target nucleic acids are NOT DETECTED.  The SARS-CoV-2 RNA is generally detectable in upper and lower respiratory specimens during the acute phase of infection. Negative results do not preclude SARS-CoV-2 infection, do  not rule out co-infections with other pathogens, and should not be used as the sole basis for treatment or other patient management decisions. Negative results must be combined with clinical observations, patient history, and epidemiological information. The expected result is Negative.  Fact Sheet for Patients: HairSlick.no  Fact Sheet for Healthcare Providers: quierodirigir.com  This test is not yet approved or cleared by the [place] FDA and  has been authorized for detection and/or diagnosis of SARS-CoV-2 by FDA under an Emergency Use Authorization (EUA). This EUA will remain  in effect (meaning this test can be used) for the duration of the COVID-19 declaration under Se ction 564(b)(1) of the Act, 21 U.S.C. section 360bbb-3(b)(1), unless the authorization is terminated or revoked sooner.  Performed at [facility], [address] [place], [place] [address]   Urine culture     Status: Abnormal   Collection Time: 01/11/21  4:08 PM   Specimen: Urine, Random  Result Value Ref Range Status    Specimen Description   Final    URINE, RANDOM Performed at [facility], [address] [place], [place] [address]    Special Requests   Final    NONE Performed at [facility], [address] [place], [place] [address]    Culture (A)  Final    <10,000 COLONIES/mL INSIGNIFICANT GROWTH Performed at [facility], [address] [place], [place] [address]    Report Status 01/13/2021 FINAL  Final  Surgical PCR screen     Status: None   Collection Time: 01/14/21  8:24 AM   Specimen: Nasal Mucosa; Nasal Swab  Result Value Ref Range Status   MRSA, PCR NEGATIVE NEGATIVE Final   Staphylococcus aureus NEGATIVE NEGATIVE Final    Comment: (NOTE) The Xpert SA Assay (FDA approved for NASAL specimens in patients 31 years of age and older), is one component of a comprehensive surveillance program. It is not intended to diagnose infection nor to guide or monitor treatment. Performed at [facility], [address] [place], [place] [address]          Radiology Studies: DG Foot 2 Views Left  Result Date: 01/14/2021 CLINICAL DATA:  Interval amputation EXAM: LEFT FOOT - 2 VIEW COMPARISON:  01/11/2021 FINDINGS: There is been further amputation in the midportion of the first metatarsal. Multiple antibiotic beads are noted. Chronic changes in the second and third metatarsals are seen as well as erosive changes in the tip of the third distal phalanx stable from the prior exam. No new focal abnormality is noted. Flattening of the plantar arch is seen. IMPRESSION: Interval mid first metatarsal amputation. Electronically Signed   By: Alcide Clever M.D.   On: 01/14/2021 18:50   DG Foot 2 Views Right  Result Date: 01/14/2021 CLINICAL DATA:  Status post amputation EXAM: RIGHT FOOT - 2 VIEW COMPARISON:  01/11/2021 FINDINGS: Stable amputation in the distal aspect of the second metatarsal is seen. New amputation in the third digit involving the distal aspect of the third  metatarsal as well as the third proximal phalanx. Multiple antibiotic beads are noted within. The distal aspect of the third digit is seen. Soft tissue changes are noted. IMPRESSION: Interval amputation of the third proximal phalanx and distal aspect of the third metatarsal. Electronically Signed   By: Alcide Clever M.D.   On: 01/14/2021 18:49        Scheduled Meds: . vitamin C  250 mg Oral Daily  . aspirin EC  81  mg Oral Daily  . atorvastatin  40 mg Oral Daily  . enoxaparin (LOVENOX) injection  40 mg Subcutaneous Q24H  . insulin aspart  0-15 Units Subcutaneous TID WC  . nicotine  14 mg Transdermal Daily  . nutrition supplement (JUVEN)  1 packet Oral BID BM  . Ensure Max Protein  11 oz Oral BID   Continuous Infusions: . sodium chloride 100 mL/hr at 01/14/21 1416  . ampicillin-sulbactam (UNASYN) IV    . vancomycin 1,250 mg (01/15/21 0943)     LOS: 4 days    Time spent: 22 minutes    Marrion Coyekui Ibeth Fahmy, MD [facility]   To contact the attending provider between 7A-7P or the covering provider during after hours 7P-7A, please log into the web site [email or web address] and access using universal [place] password for that web site. If you do not have the password, please call the hospital operator.  01/15/2021, 10:56 AM

## 2021-01-15 NOTE — Progress Notes (Signed)
  Subjective:  Patient ID: [name] [name], male    DOB: 28-Jul-1961,  MRN: [id number]  Feeling well not having much pain he is ready to go home  Negative for chest pain and shortness of breath Review of all other systems is negative Objective:   Vitals:   01/15/21 0817 01/15/21 1121  BP: 129/69 117/84  Pulse: 93 88  Resp: 20 16  Temp: 98.9 F (37.2 C) 99 F (37.2 C)  SpO2: 98% 100%   General AA&O x3. Normal mood and affect.  Vascular  foot is warm, pulses are weakly palpable  Neurologic Epicritic sensation grossly intact.  Dermatologic  dressings are clean dry and intact  Orthopedic: MMT 5/5 in dorsiflexion, plantarflexion, inversion, and eversion. Normal joint ROM without pain or crepitus.     Assessment & Plan:  Patient was evaluated and treated and all questions answered.  Bilateral diabetic foot infections with osteomyelitis -I feel fairly confident we have a surgical cure his osteomyelitis and I discussed this with him. -Recommend 2 weeks of oral antibiotics based on culture data from OR yesterday.  Do not think he needs a PICC line -He may be WBAT on the bilateral lower extremities in a surgical shoe.  I discussed with him should rest is much as possible.  Try to avoid weight on the forefoot and focus weight on the heel. -Dressing should be changed every other day while in hospital and at home after discharge with dry sterile gauze dressings and Ace wraps -Follow-up me in 1 to 2 weeks after discharge my [place] office.  Will have my office scheduling reach out to him for follow-up -No further surgical plans at this time, okay to discharge from my standpoint when culture data can guide antibiotics  Edwin Cap, DPM  Accessible via secure chat for questions or concerns.

## 2021-01-15 NOTE — Plan of Care (Signed)
  Problem: Education: Goal: Knowledge of General Education information will improve Description: Including pain rating scale, medication(s)/side effects and non-pharmacologic comfort measures Outcome: Progressing   Problem: Safety: Goal: Ability to remain free from injury will improve Outcome: Progressing   Problem: Pain Managment: Goal: General experience of comfort will improve Outcome: Progressing   Problem: Skin Integrity: Goal: Risk for impaired skin integrity will decrease Outcome: Progressing   

## 2021-01-16 ENCOUNTER — Encounter: Payer: Self-pay | Admitting: Podiatry

## 2021-01-16 LAB — CULTURE, BLOOD (SINGLE): Culture: NO GROWTH

## 2021-01-16 LAB — GLUCOSE, CAPILLARY
Glucose-Capillary: 101 mg/dL — ABNORMAL HIGH (ref 70–99)
Glucose-Capillary: 95 mg/dL (ref 70–99)
Glucose-Capillary: 97 mg/dL (ref 70–99)
Glucose-Capillary: 90 mg/dL (ref 70–99)

## 2021-01-16 LAB — VANCOMYCIN, PEAK: Vancomycin Pk: 26 ug/mL — ABNORMAL LOW (ref 30–40)

## 2021-01-16 LAB — VANCOMYCIN, TROUGH: Vancomycin Tr: 17 ug/mL (ref 15–20)

## 2021-01-16 MED ORDER — VANCOMYCIN HCL 1750 MG/350ML IV SOLN
1750.0000 mg | Freq: Two times a day (BID) | INTRAVENOUS | Status: DC
  Administered 2021-01-16 – 2021-01-17 (×2): 1750 mg via INTRAVENOUS
  Filled 2021-01-16 (×3): qty 350

## 2021-01-16 NOTE — Progress Notes (Signed)
Pt dressing change completed. Pt tolerated very well. R dorsal incision did have a small amount of bloody drainage during the dressing change, all sutures appear intact with no s/s of infection to incisions. Xeroform, gauze, abd, kerlex, and ace bandages applied bilaterally per orders. Next dressing to be done on 5/24 (Tuesday) as MD notes state every other day changes. Pt is aware.

## 2021-01-16 NOTE — Progress Notes (Signed)
PROGRESS NOTE    [name] [name]  IAX:[id number] DOB: [birth year] DOA: 01/11/2021 PCP: Patient, No Pcp Per (Inactive)   Follow up on bilateral foot wounds. Brief Narrative:  [name] [name] a 60 y.o.malewith medical history significant fordiabetes mellitus, nicotine dependence, peripheral vascular disease status post amputation of the left great toe and second toe on the right foot,alcohol dependence who presents to the emergency room for evaluation of wounds to both feet.Patient has been seen by podiatry, MRI of bilateral foot showed early osteomyelitis in both side. Patientinitially placed on Rocephin and Flagyl, then discontinued following recommendation from podiatry. 5/20.  Following surgery procedures were performed: 1) partial resection metatarsal left first 2)incision bone cortex right foot with resection of third metatarsal phalangeal joint 3)insertion biodegradable drug delivery implant bilateral foot. Culture sent out, vancomycin and Unasyn started postop.    Assessment & Plan:   Principal Problem:   Diabetic foot ulcer (HCC) Active Problems:   Hypokalemia   Anemia of chronic disease   Acute osteomyelitis of toe of right foot (HCC)   Tobacco use disorder   Peripheral vascular disease (HCC)   Diabetic foot infection (HCC)  #1.  Bilateral bilateral foot ulcer with osteomyelitis. Type 2 diabetes. Status postsurgical resection of osteomyelitis.  Condition had improved.  Wound culture still pending.  Plan to discharge home tomorrow with oral antibiotics per recommendation from podiatry.  2.  Anemia of chronic disease. Stable  3.  Lactic acidosis. Improved.    DVT prophylaxis: Lovenox Code Status: Full Family Communication:  Disposition Plan:  .   Status is: Inpatient  Remains inpatient appropriate because:Inpatient level of care appropriate due to severity of illness   Dispo: The patient is from: Home               Anticipated d/c is to: Home              Patient currently is not medically stable to d/c.   Difficult to place patient No        I/O last 3 completed shifts: In: 3835.7 [P.O.:720; I.V.:1724.7; IV Piggyback:1391] Out: 6025 [Urine:6025] Total I/O In: 240 [P.O.:240] Out: -      Consultants:   Podiatry  Procedures: Foot  Antimicrobials:  Vancomycin and Unasyn.  Subjective: Patient doing well today, pain under control pain No short of breath or cough.  No fever chills No dysuria hematuria No abdominal pain or nausea vomiting.  Objective: Vitals:   01/15/21 1613 01/15/21 2117 01/16/21 0443 01/16/21 0759  BP: 134/78 126/80 126/75 116/76  Pulse: 92 88 86 89  Resp: 16 18 18 18   Temp: 98.7 F (37.1 C) 97.9 F (36.6 C) 98.1 F (36.7 C) 98.6 F (37 C)  TempSrc: Oral     SpO2: 99% 99% 97% 98%  Weight:      Height:        Intake/Output Summary (Last 24 hours) at 01/16/2021 1034 Last data filed at 01/16/2021 1019 Gross per 24 hour  Intake 2725.71 ml  Output 3325 ml  Net -599.29 ml   Filed Weights   01/11/21 1341  Weight: 106.6 kg    Examination:  General exam: Appears calm and comfortable  Respiratory system: Clear to auscultation. Respiratory effort normal. Cardiovascular system: S1 & S2 heard, RRR. No JVD, murmurs, rubs, gallops or clicks. No pedal edema. Gastrointestinal system: Abdomen is nondistended, soft and nontender. No organomegaly or masses felt. Normal bowel sounds heard. Central nervous system: Alert and oriented. No focal neurological deficits. Extremities: Bilateral foot  wounds Skin: No rashes, lesions or ulcers Psychiatry: Judgement and insight appear normal. Mood & affect appropriate.     Data Reviewed: I have personally reviewed following labs and imaging studies  CBC: Recent Labs  Lab 01/11/21 1235 01/12/21 0652 01/15/21 0609  WBC 8.5 6.5 9.0  NEUTROABS 6.3  --  7.0  HGB 13.9 12.6* 12.8*  HCT 40.7 36.1* 36.6*  MCV 101.2* 100.8*  100.3*  PLT 227 235 331   Basic Metabolic Panel: Recent Labs  Lab 01/11/21 1235 01/12/21 0652 01/15/21 0609  NA 134* 138 136  K 3.0* 3.9 4.3  CL 100 104 103  CO2 22 25 25   GLUCOSE 142* 121* 120*  BUN <5* 5* 15  CREATININE 0.63 0.70 0.69  CALCIUM 8.9 8.8* 9.1  MG 2.1  --  2.0   GFR: Estimated Creatinine Clearance: 128.2 mL/min (by C-G formula based on SCr of 0.69 mg/dL). Liver Function Tests: Recent Labs  Lab 01/11/21 1235  AST 18  ALT 12  ALKPHOS 70  BILITOT 0.7  PROT 7.7  ALBUMIN 3.6   No results for input(s): LIPASE, AMYLASE in the last 168 hours. No results for input(s): AMMONIA in the last 168 hours. Coagulation Profile: Recent Labs  Lab 01/11/21 1235  INR 1.0   Cardiac Enzymes: No results for input(s): CKTOTAL, CKMB, CKMBINDEX, TROPONINI in the last 168 hours. BNP (last 3 results) No results for input(s): PROBNP in the last 8760 hours. HbA1C: No results for input(s): HGBA1C in the last 72 hours. CBG: Recent Labs  Lab 01/15/21 0818 01/15/21 1123 01/15/21 1615 01/15/21 2257 01/16/21 0810  GLUCAP 121* 92 98 119* 101*   Lipid Profile: No results for input(s): CHOL, HDL, LDLCALC, TRIG, CHOLHDL, LDLDIRECT in the last 72 hours. Thyroid Function Tests: No results for input(s): TSH, T4TOTAL, FREET4, T3FREE, THYROIDAB in the last 72 hours. Anemia Panel: No results for input(s): VITAMINB12, FOLATE, FERRITIN, TIBC, IRON, RETICCTPCT in the last 72 hours. Sepsis Labs: Recent Labs  Lab 01/11/21 1235 01/11/21 1604  LATICACIDVEN 2.6* 2.1*    Recent Results (from the past 240 hour(s))  Blood culture (routine single)     Status: None   Collection Time: 01/11/21 12:48 PM   Specimen: BLOOD  Result Value Ref Range Status   Specimen Description BLOOD BLOOD LEFT ARM  Final   Special Requests   Final    BOTTLES DRAWN AEROBIC AND ANAEROBIC Blood Culture results may not be optimal due to an inadequate volume of blood received in culture bottles   Culture    Final    NO GROWTH 5 DAYS Performed at [facility], [address], [place], [place]    Report Status 01/16/2021 FINAL  Final  SARS CORONAVIRUS 2 (TAT 6-24 HRS) Nasopharyngeal Nasopharyngeal Swab     Status: None   Collection Time: 01/11/21  2:30 PM   Specimen: Nasopharyngeal Swab  Result Value Ref Range Status   SARS Coronavirus 2 NEGATIVE NEGATIVE Final    Comment: (NOTE) SARS-CoV-2 target nucleic acids are NOT DETECTED.  The SARS-CoV-2 RNA is generally detectable in upper and lower respiratory specimens during the acute phase of infection. Negative results do not preclude SARS-CoV-2 infection, do not rule out co-infections with other pathogens, and should not be used as the sole basis for treatment or other patient management decisions. Negative results must be combined with clinical observations, patient history, and epidemiological information. The expected result is Negative.  Fact Sheet for Patients: 01/13/21  Fact Sheet for Healthcare Providers: HairSlick.no  This test is not yet approved or cleared by the [place] FDA and  has been authorized for detection and/or diagnosis of SARS-CoV-2 by FDA under an Emergency Use Authorization (EUA). This EUA will remain  in effect (meaning this test can be used) for the duration of the COVID-19 declaration under Se ction 564(b)(1) of the Act, 21 U.S.C. section 360bbb-3(b)(1), unless the authorization is terminated or revoked sooner.  Performed at [facility], [address] [place], [place] [address]   Urine culture     Status: Abnormal   Collection Time: 01/11/21  4:08 PM   Specimen: Urine, Random  Result Value Ref Range Status   Specimen Description   Final    URINE, RANDOM Performed at [facility], [address] [place], [place] [address]    Special Requests   Final    NONE Performed at [facility],  [address] [place], [place] [address]    Culture (A)  Final    <10,000 COLONIES/mL INSIGNIFICANT GROWTH Performed at [facility], [address] [place], [place] [address]    Report Status 01/13/2021 FINAL  Final  Surgical PCR screen     Status: None   Collection Time: 01/14/21  8:24 AM   Specimen: Nasal Mucosa; Nasal Swab  Result Value Ref Range Status   MRSA, PCR NEGATIVE NEGATIVE Final   Staphylococcus aureus NEGATIVE NEGATIVE Final    Comment: (NOTE) The Xpert SA Assay (FDA approved for NASAL specimens in patients 62 years of age and older), is one component of a comprehensive surveillance program. It is not intended to diagnose infection nor to guide or monitor treatment. Performed at [facility], [address] [place], [place] [address]   Aerobic/Anaerobic Culture w Gram Stain (surgical/deep wound)     Status: None (Preliminary result)   Collection Time: 01/14/21  3:35 PM   Specimen: Toe, Right; Amputation  Result Value Ref Range Status   Specimen Description   Final    WOUND Performed at [facility], [address] [place], [place] [address]    Special Requests   Final    RIGHT TOE Performed at [facility], [address] [place], [place] [address]    Gram Stain NO WBC SEEN NO ORGANISMS SEEN   Final   Culture   Final    CULTURE REINCUBATED FOR BETTER GROWTH Performed at [facility], [address] [place], [place] [address]    Report Status PENDING  Incomplete  Aerobic/Anaerobic Culture w Gram Stain (surgical/deep wound)     Status: None (Preliminary result)   Collection Time: 01/14/21  3:50 PM   Specimen: Toe, Left; Amputation  Result Value Ref Range Status   Specimen Description   Final    TOE LEFT Performed at [facility], [address] [place], [place] [address]    Special Requests   Final    NONE Performed at [facility], [address] [place], [place] [address]     Gram Stain NO WBC SEEN NO ORGANISMS SEEN   Final   Culture   Final    CULTURE REINCUBATED FOR BETTER GROWTH Performed at [facility], [address] [place], [place] [address]    Report Status PENDING  Incomplete         Radiology Studies: DG Foot 2 Views Left  Result Date: 01/14/2021 CLINICAL DATA:  Interval amputation EXAM: LEFT FOOT - 2 VIEW COMPARISON:  01/11/2021  FINDINGS: There is been further amputation in the midportion of the first metatarsal. Multiple antibiotic beads are noted. Chronic changes in the second and third metatarsals are seen as well as erosive changes in the tip of the third distal phalanx stable from the prior exam. No new focal abnormality is noted. Flattening of the plantar arch is seen. IMPRESSION: Interval mid first metatarsal amputation. Electronically Signed   By: Alcide Clever M.D.   On: 01/14/2021 18:50   DG Foot 2 Views Right  Result Date: 01/14/2021 CLINICAL DATA:  Status post amputation EXAM: RIGHT FOOT - 2 VIEW COMPARISON:  01/11/2021 FINDINGS: Stable amputation in the distal aspect of the second metatarsal is seen. New amputation in the third digit involving the distal aspect of the third metatarsal as well as the third proximal phalanx. Multiple antibiotic beads are noted within. The distal aspect of the third digit is seen. Soft tissue changes are noted. IMPRESSION: Interval amputation of the third proximal phalanx and distal aspect of the third metatarsal. Electronically Signed   By: Alcide Clever M.D.   On: 01/14/2021 18:49        Scheduled Meds: . vitamin C  250 mg Oral Daily  . aspirin EC  81 mg Oral Daily  . atorvastatin  40 mg Oral Daily  . enoxaparin (LOVENOX) injection  40 mg Subcutaneous Q24H  . insulin aspart  0-15 Units Subcutaneous TID WC  . nicotine  14 mg Transdermal Daily  . nutrition supplement (JUVEN)  1 packet Oral BID BM  . Ensure Max Protein  11 oz Oral BID   Continuous Infusions: . sodium chloride 100 mL/hr at  01/16/21 0600  . ampicillin-sulbactam (UNASYN) IV 3 g (01/16/21 0909)  . vancomycin 1,250 mg (01/16/21 1009)     LOS: 5 days    Time spent: 21 minutes    Marrion Coy, MD [facility]   To contact the attending provider between 7A-7P or the covering provider during after hours 7P-7A, please log into the web site [email or web address] and access using universal [place] password for that web site. If you do not have the password, please call the hospital operator.  01/16/2021, 10:34 AM

## 2021-01-16 NOTE — Progress Notes (Addendum)
Pharmacy Antibiotic Note  [name] [name] is a 60 y.o. male with PMH of DM, PVD, s/p amputation of the left great toe and second toe on the right foot admitted on 01/11/2021 with cellulitis of both feet now POD 0 I&D by podiatry. A wound culture was collected which is now pending. Pharmacy has been consulted for Unasyn and vancomycin dosing.   Last dose given 5/22 @ 1009  5/22 @ 1250 Vanc Pk: 26 5/22 @ 1709 Vanc Tr: 17  Calculated AUC: 521.9    Plan:  1) Continue  Unasyn 3 grams IV every 6 hours  2) Adjust Vancomycin to 1750mg  IV every 12 hours   Goal AUC 400-550  Expected AUC: 485.8  Ke: 0.0984, t1/2: 7h, Vd: 73L  Css (calculated): 31.4 / 11.7 mcg/mL  Continue daily renal function assessment while on IV vancomycin  Follow wound culture for potential narrowing of antibiotics  Height: 6\' 4"  (193 cm) Weight: 106.6 kg (235 lb) IBW/kg (Calculated) : 86.8  Temp (24hrs), Avg:98.3 F (36.8 C), Min:97.9 F (36.6 C), Max:98.6 F (37 C)  Recent Labs  Lab 01/11/21 1235 01/11/21 1604 01/12/21 0652 01/15/21 0609 01/16/21 1250 01/16/21 1709  WBC 8.5  --  6.5 9.0  --   --   CREATININE 0.63  --  0.70 0.69  --   --   LATICACIDVEN 2.6* 2.1*  --   --   --   --   VANCOTROUGH  --   --   --   --   --  17  VANCOPEAK  --   --   --   --  26*  --     Estimated Creatinine Clearance: 128.2 mL/min (by C-G formula based on SCr of 0.69 mg/dL).    No Known Allergies  Antimicrobials this admission: vancomycin 05/20 >>  Unasyn 05/20 >>   Microbiology results: 05/17 BCx: NG x 3 days 05/17 UCx: <10k CFU  05/20 WCx: pending 05/17 SARS CoV-2: negative 05/20 MRSA PCR: negative  Thank you for allowing pharmacy to be a part of this patient's care.  6/17, PharmD, BCPS Clinical Pharmacist 01/16/2021 5:47 PM

## 2021-01-17 ENCOUNTER — Telehealth: Payer: Self-pay

## 2021-01-17 ENCOUNTER — Telehealth: Payer: Self-pay | Admitting: Podiatry

## 2021-01-17 LAB — GLUCOSE, CAPILLARY
Glucose-Capillary: 106 mg/dL — ABNORMAL HIGH (ref 70–99)
Glucose-Capillary: 95 mg/dL (ref 70–99)

## 2021-01-17 MED ORDER — AMOXICILLIN-POT CLAVULANATE 875-125 MG PO TABS: 1.0000 | ORAL_TABLET | Freq: Two times a day (BID) | ORAL | 0 refills | Status: AC

## 2021-01-17 NOTE — Telephone Encounter (Signed)
May need to talk to his brother for scheduling

## 2021-01-17 NOTE — Progress Notes (Signed)
PT Cancellation Note  Patient Details Name: [name] [name] MRN: [id number] DOB: [birth year]   Cancelled Treatment:    Reason Eval/Treat Not Completed: PT screened, no needs identified, will sign off.  Per nursing pt ambulating independently in room.  Spoke to patient who reported no physical deficits and declined PT services.  Will complete PT orders at this time but will reassess pt pending a change in status upon receipt of new PT orders.    DLorin Picket Ayn Domangue PT, DPT 01/17/21, 1:25 PM

## 2021-01-17 NOTE — Plan of Care (Signed)
Patient discharged home per MD orders at this time.All discharge instructions, education and medications reviewed with patient.Pt expressed understanding and will comply with dc instructions.Follow up appointments also communicated to patient.no verbal c/o or any ssx of distress.Patient was transported home by brother in a private car.

## 2021-01-17 NOTE — Telephone Encounter (Signed)
Hospital follow up  *Patient doesn't not have a direct contact number, may need to call Brother [facility]*     Provider requesting: Dr. Marrion Coy - [phone]  DX: Amputation of right foot (diabetic foot infection)

## 2021-01-17 NOTE — Discharge Summary (Signed)
Physician Discharge Summary  Patient ID: [name] [name] MRN: [id number] DOB/AGE: June 12, 1961 60 y.o.  Admit date: 01/11/2021 Discharge date: 01/17/2021  Admission Diagnoses:  Discharge Diagnoses:  Principal Problem:   Diabetic foot ulcer (HCC) Active Problems:   Hypokalemia   Anemia of chronic disease   Acute osteomyelitis of toe of right foot (HCC)   Tobacco use disorder   Peripheral vascular disease (HCC)   Diabetic foot infection (HCC) Sepsis ruled out  Discharged Condition: good  Hospital Course:  [name] [name] a 60 y.o.malewith medical history significant fordiabetes mellitus, nicotine dependence, peripheral vascular disease status post amputation of the left great toe and second toe on the right foot,alcohol dependence who presents to the emergency room for evaluation of wounds to both feet.Patient has been seen by podiatry, MRI of bilateral foot showed early osteomyelitis in both side. Patientinitially placed on Rocephin and Flagyl, then discontinued following recommendation from podiatry. 5/20. Following surgery procedures were performed: 1) partial resection metatarsal left first 2)incision bone cortex right foot with resection of third metatarsal phalangeal joint 3)insertion biodegradable drug delivery implant bilateral foot. Culture sent out, vancomycin and Unasyn started postop.   #1.  Bilateral bilateral foot ulcer with osteomyelitis secondary to Proteus Type 2 diabetes. Status postsurgical resection of osteomyelitis.    Per podiatry, patient has been surgically cured.  Condition had improved.    Patient being treated with vancomycin and Unasyn.  Wound culture came back with Proteus, susceptible to Augmentin.  We will treat with 2 weeks of Augmentin per recommendation from podiatry.  2.  Anemia of chronic disease. Stable  3.  Lactic acidosis. Improved.   Consults: Podiatry   Significant Diagnostic Studies:  MRI OF THE LEFT  FOOT WITHOUT CONTRAST  TECHNIQUE: Multiplanar, multisequence MR imaging of the left foot was performed. No intravenous contrast was administered.  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 Prior great toe amputation. There is mild edema signal within the residual first metatarsal, without corresponding low T1 signal.  Ligaments  Intact lesser digit plantar plates.  Muscles and Tendons  Diffuse muscular atrophy.  Soft tissues  Generalized soft tissue edema. There is a head plantar soft tissue ulcer along the medial forefoot, with significant adjacent edema. This is in close proximity to the residual first metatarsal.  IMPRESSION: Medial plantar soft tissue ulcer with adjacent edema in close proximity to the first metatarsal. Mild edema signal within the residual first metatarsal without corresponding low T1 signal, which is compatible with either reactive changes or early osteomyelitis.   Electronically Signed   By: Caprice Renshaw   On: 01/12/2021 13:18  MRI OF THE RIGHT FOREFOOT WITHOUT CONTRAST  TECHNIQUE: Multiplanar, multisequence MR imaging of the right foot was performed. No intravenous contrast was administered.  COMPARISON:  Right foot radiograph 01/11/2021  FINDINGS: Bones/Joint/Cartilage  Prior second toe amputation with residual second metatarsal. There is bony edema within the mid to distal third metatarsal and proximal phalanx with intermediate, faint low T1 signal. There is a joint effusion of the third MTP joint.  Ligaments  Likely third digit plantar plate tear with hyperextension and dorsal subluxation at the MTP joint.  Muscles and Tendons  Diffuse muscle atrophy of the foot.  Soft tissues  Extensive soft tissue swelling in the foot. There is a plantar soft tissue ulcer which extends to the cortical bone  of the third metatarsal head (axial T2 image 19). There is an additional  superficial plantar wound proximally and medially at the level of the mid first metatarsal,, without adjacent fluid collection.  IMPRESSION: Forefoot plantar surface soft tissue ulcer extending to the third metatarsal head, with evidence of third MTP septic arthritis and likely early osteomyelitis in the adjacent third metatarsal and proximal phalanx. Edema signal in the third metatarsal extends proximally to the proximal mid shaft.   Electronically Signed   By: Caprice Renshaw   On: 01/12/2021 13:13  RIGHT FOOT - 2 VIEW  COMPARISON:  01/11/2021  FINDINGS: Stable amputation in the distal aspect of the second metatarsal is seen. New amputation in the third digit involving the distal aspect of the third metatarsal as well as the third proximal phalanx. Multiple antibiotic beads are noted within. The distal aspect of the third digit is seen. Soft tissue changes are noted.  IMPRESSION: Interval amputation of the third proximal phalanx and distal aspect of the third metatarsal.   Electronically Signed   By: Alcide Clever M.D.   On: 01/14/2021 18:49  LEFT FOOT - 2 VIEW  COMPARISON:  01/11/2021  FINDINGS: There is been further amputation in the midportion of the first metatarsal. Multiple antibiotic beads are noted. Chronic changes in the second and third metatarsals are seen as well as erosive changes in the tip of the third distal phalanx stable from the prior exam. No new focal abnormality is noted. Flattening of the plantar arch is seen.  IMPRESSION: Interval mid first metatarsal amputation.   Electronically Signed   By: Alcide Clever M.D.   On: 01/14/2021 18:50     Treatments: Vancomycin and Unasyn.  Discharge Exam: Blood pressure (!) 147/81, pulse 88, temperature 98.1 F (36.7 C), temperature source Oral, resp. rate 16, height 6\' 4"  (1.93 m), weight 106.6 kg, SpO2 98  %. General appearance: alert and cooperative Resp: clear to auscultation bilaterally Cardio: regular rate and rhythm, S1, S2 normal, no murmur, click, rub or gallop GI: soft, non-tender; bowel sounds normal; no masses,  no organomegaly Extremities: extremities normal, atraumatic, no cyanosis or edema  Disposition: Discharge disposition: 01-Home or Self Care       Discharge Instructions    Ambulatory referral to Wound Clinic   Complete by: As directed    Diet - low sodium heart healthy   Complete by: As directed    Discharge wound care:   Complete by: As directed    Refer to outpatient wound care, follow with podiatry.   Increase activity slowly   Complete by: As directed      Allergies as of 01/17/2021   No Known Allergies     Medication List    STOP taking these medications   sulfamethoxazole-trimethoprim 800-160 MG tablet Commonly known as: BACTRIM DS     TAKE these medications   amoxicillin-clavulanate 875-125 MG tablet Commonly known as: Augmentin Take 1 tablet by mouth 2 (two) times daily for 14 days.   nicotine 14 mg/24hr patch Commonly known as: NICODERM CQ - dosed in mg/24 hours Place 1 patch (14 mg total) onto the skin daily.   polyethylene glycol 17 g packet Commonly known as: MIRALAX / GLYCOLAX Take 17 g by mouth daily as needed for mild constipation.            Discharge Care Instructions  (From admission, onward)         Start     Ordered   01/17/21 0000  Discharge wound care:       Comments: Refer to outpatient wound care,  follow with podiatry.   01/17/21 1027          Follow-up Information    McDonald, Rachelle Hora, DPM Follow up in 1 week(s).   Specialty: Podiatry Contact information: [address] [place] [address] [phone]              32 minutes Signed: Marrion Coy 01/17/2021, 10:27 AM

## 2021-01-17 NOTE — TOC Progression Note (Signed)
Transition of Care [facility]) - Progression Note    Patient Details  Name: [name] [name] MRN: [id number] Date of Birth: 13-Nov-1960  Transition of Care [facility]) CM/SW [place], RN Phone Number: 01/17/2021, 10:43 AM  Clinical Narrative:    Met with the patient to discuss DC plan and needs He lives alone His brother provides transportation He has Medicaid for insurace  He is able to get his medication at [facility] He does not have a PCP I called [facility] to get him PCP set up at his request, Made an appointment with  Friday 5/27 at 1030, be there at 10 AM  At [place],  Will see Evern Bio He has a rolling walker that he will use at home, he stated that he does not need Home health Educated that he will need to call Medicaid to add PCP, he stated understanding        Expected Discharge Plan and Services           Expected Discharge Date: 01/17/21                                     Social Determinants of Health (SDOH) Interventions    Readmission Risk Interventions No flowsheet data found.

## 2021-01-17 NOTE — Telephone Encounter (Signed)
Spoke to [place] to get a post op appointment set up for [employer]. He stated he was driving and couldn't take down any information at the moment. I gave him the [name] phone number for him to call back and schedule. Dr. Lilian Kapur wants to see him the first week of June in the [place] office.

## 2021-01-19 LAB — SURGICAL PATHOLOGY

## 2021-01-20 LAB — AEROBIC/ANAEROBIC CULTURE W GRAM STAIN (SURGICAL/DEEP WOUND)
Culture: NORMAL
Gram Stain: NONE SEEN
Gram Stain: NONE SEEN

## 2021-01-27 ENCOUNTER — Ambulatory Visit: Payer: Medicaid Other | Admitting: Physician Assistant

## 2021-02-02 ENCOUNTER — Ambulatory Visit: Payer: Medicaid Other | Admitting: Internal Medicine

## 2021-02-07 ENCOUNTER — Ambulatory Visit: Payer: Medicaid Other | Admitting: Physician Assistant

## 2021-02-11 ENCOUNTER — Other Ambulatory Visit: Payer: Self-pay

## 2021-02-11 ENCOUNTER — Encounter: Payer: Medicaid Other | Admitting: Physician Assistant

## 2021-02-16 ENCOUNTER — Ambulatory Visit: Payer: Self-pay | Admitting: Podiatry

## 2021-06-29 ENCOUNTER — Emergency Department: Payer: Medicaid Other

## 2021-06-29 ENCOUNTER — Inpatient Hospital Stay: Payer: Medicaid Other

## 2021-06-29 ENCOUNTER — Encounter: Payer: Self-pay | Admitting: Emergency Medicine

## 2021-06-29 ENCOUNTER — Other Ambulatory Visit: Payer: Self-pay

## 2021-06-29 ENCOUNTER — Inpatient Hospital Stay
Admission: EM | Admit: 2021-06-29 | Discharge: 2021-07-05 | DRG: 854 | Disposition: A | Discharge status: Home Health | Payer: Medicaid Other | Attending: Internal Medicine | Admitting: Internal Medicine

## 2021-06-29 DIAGNOSIS — F17290 Nicotine dependence, other tobacco product, uncomplicated: Secondary | ICD-10-CM | POA: Diagnosis present

## 2021-06-29 DIAGNOSIS — Z833 Family history of diabetes mellitus: Secondary | ICD-10-CM | POA: Diagnosis not present

## 2021-06-29 DIAGNOSIS — A4159 Other Gram-negative sepsis: Principal | ICD-10-CM | POA: Diagnosis present

## 2021-06-29 DIAGNOSIS — E1169 Type 2 diabetes mellitus with other specified complication: Secondary | ICD-10-CM | POA: Diagnosis present

## 2021-06-29 DIAGNOSIS — E1151 Type 2 diabetes mellitus with diabetic peripheral angiopathy without gangrene: Secondary | ICD-10-CM | POA: Diagnosis present

## 2021-06-29 DIAGNOSIS — Z20822 Contact with and (suspected) exposure to covid-19: Secondary | ICD-10-CM | POA: Diagnosis present

## 2021-06-29 DIAGNOSIS — E876 Hypokalemia: Secondary | ICD-10-CM | POA: Diagnosis present

## 2021-06-29 DIAGNOSIS — Z823 Family history of stroke: Secondary | ICD-10-CM

## 2021-06-29 DIAGNOSIS — L97526 Non-pressure chronic ulcer of other part of left foot with bone involvement without evidence of necrosis: Secondary | ICD-10-CM | POA: Diagnosis present

## 2021-06-29 DIAGNOSIS — D539 Nutritional anemia, unspecified: Secondary | ICD-10-CM | POA: Diagnosis present

## 2021-06-29 DIAGNOSIS — L03116 Cellulitis of left lower limb: Secondary | ICD-10-CM | POA: Diagnosis present

## 2021-06-29 DIAGNOSIS — M86072 Acute hematogenous osteomyelitis, left ankle and foot: Secondary | ICD-10-CM

## 2021-06-29 DIAGNOSIS — M7989 Other specified soft tissue disorders: Secondary | ICD-10-CM

## 2021-06-29 DIAGNOSIS — L02612 Cutaneous abscess of left foot: Secondary | ICD-10-CM | POA: Diagnosis present

## 2021-06-29 DIAGNOSIS — D638 Anemia in other chronic diseases classified elsewhere: Secondary | ICD-10-CM | POA: Diagnosis present

## 2021-06-29 DIAGNOSIS — R350 Frequency of micturition: Secondary | ICD-10-CM | POA: Diagnosis present

## 2021-06-29 DIAGNOSIS — R3915 Urgency of urination: Secondary | ICD-10-CM | POA: Diagnosis present

## 2021-06-29 DIAGNOSIS — E872 Acidosis, unspecified: Secondary | ICD-10-CM | POA: Diagnosis present

## 2021-06-29 DIAGNOSIS — D75839 Thrombocytosis, unspecified: Secondary | ICD-10-CM | POA: Diagnosis not present

## 2021-06-29 DIAGNOSIS — F17210 Nicotine dependence, cigarettes, uncomplicated: Secondary | ICD-10-CM | POA: Diagnosis present

## 2021-06-29 DIAGNOSIS — Z89419 Acquired absence of unspecified great toe: Secondary | ICD-10-CM

## 2021-06-29 DIAGNOSIS — Z89421 Acquired absence of other right toe(s): Secondary | ICD-10-CM

## 2021-06-29 DIAGNOSIS — E11628 Type 2 diabetes mellitus with other skin complications: Secondary | ICD-10-CM | POA: Diagnosis present

## 2021-06-29 DIAGNOSIS — A411 Sepsis due to other specified staphylococcus: Secondary | ICD-10-CM | POA: Diagnosis present

## 2021-06-29 DIAGNOSIS — E11621 Type 2 diabetes mellitus with foot ulcer: Secondary | ICD-10-CM | POA: Diagnosis present

## 2021-06-29 DIAGNOSIS — Z09 Encounter for follow-up examination after completed treatment for conditions other than malignant neoplasm: Secondary | ICD-10-CM

## 2021-06-29 DIAGNOSIS — M86172 Other acute osteomyelitis, left ankle and foot: Secondary | ICD-10-CM | POA: Insufficient documentation

## 2021-06-29 DIAGNOSIS — A419 Sepsis, unspecified organism: Secondary | ICD-10-CM | POA: Diagnosis present

## 2021-06-29 DIAGNOSIS — B964 Proteus (mirabilis) (morganii) as the cause of diseases classified elsewhere: Secondary | ICD-10-CM | POA: Diagnosis present

## 2021-06-29 DIAGNOSIS — L089 Local infection of the skin and subcutaneous tissue, unspecified: Secondary | ICD-10-CM

## 2021-06-29 DIAGNOSIS — Z89422 Acquired absence of other left toe(s): Secondary | ICD-10-CM

## 2021-06-29 LAB — RESP PANEL BY RT-PCR (FLU A&B, COVID) ARPGX2
Influenza A by PCR: NEGATIVE
Influenza B by PCR: NEGATIVE
SARS Coronavirus 2 by RT PCR: NEGATIVE

## 2021-06-29 LAB — COMPREHENSIVE METABOLIC PANEL
ALT: 9 U/L (ref 0–44)
AST: 17 U/L (ref 15–41)
Albumin: 3.7 g/dL (ref 3.5–5.0)
Alkaline Phosphatase: 57 U/L (ref 38–126)
Anion gap: 15 (ref 5–15)
BUN: 6 mg/dL — ABNORMAL LOW (ref 8–23)
CO2: 24 mmol/L (ref 22–32)
Calcium: 9.8 mg/dL (ref 8.9–10.3)
Chloride: 95 mmol/L — ABNORMAL LOW (ref 98–111)
Creatinine, Ser: 0.71 mg/dL (ref 0.61–1.24)
GFR, Estimated: >60 mL/min (ref >60)
Glucose, Bld: 119 mg/dL — ABNORMAL HIGH (ref 70–99)
Potassium: 3.4 mmol/L — ABNORMAL LOW (ref 3.5–5.1)
Sodium: 134 mmol/L — ABNORMAL LOW (ref 135–145)
Total Bilirubin: 1.1 mg/dL (ref 0.3–1.2)
Total Protein: 7.7 g/dL (ref 6.5–8.1)

## 2021-06-29 LAB — CBC WITH DIFFERENTIAL/PLATELET
Abs Immature Granulocytes: 0.11 10*3/uL — ABNORMAL HIGH (ref 0.00–0.07)
Basophils Absolute: 0.1 10*3/uL (ref 0.0–0.1)
Basophils Relative: 1 %
Eosinophils Absolute: 0.1 10*3/uL (ref 0.0–0.5)
Eosinophils Relative: 1 %
HCT: 42.4 % (ref 39.0–52.0)
Hemoglobin: 14.6 g/dL (ref 13.0–17.0)
Immature Granulocytes: 1 %
Lymphocytes Relative: 8 %
Lymphs Abs: 0.9 10*3/uL (ref 0.7–4.0)
MCH: 36 pg — ABNORMAL HIGH (ref 26.0–34.0)
MCHC: 34.4 g/dL (ref 30.0–36.0)
MCV: 104.4 fL — ABNORMAL HIGH (ref 80.0–100.0)
Monocytes Absolute: 1.6 10*3/uL — ABNORMAL HIGH (ref 0.1–1.0)
Monocytes Relative: 14 %
Neutro Abs: 8.7 10*3/uL — ABNORMAL HIGH (ref 1.7–7.7)
Neutrophils Relative %: 75 %
Platelets: 329 10*3/uL (ref 150–400)
RBC: 4.06 MIL/uL — ABNORMAL LOW (ref 4.22–5.81)
RDW: 12.8 % (ref 11.5–15.5)
WBC: 11.5 10*3/uL — ABNORMAL HIGH (ref 4.0–10.5)
nRBC: 0 % (ref 0.0–0.2)

## 2021-06-29 LAB — PROCALCITONIN: Procalcitonin: <0.1 ng/mL

## 2021-06-29 LAB — LACTIC ACID, PLASMA
Lactic Acid, Venous: 2.5 mmol/L (ref 0.5–1.9)
Lactic Acid, Venous: 1.6 mmol/L (ref 0.5–1.9)

## 2021-06-29 LAB — SEDIMENTATION RATE: Sed Rate: 79 mm/hr — ABNORMAL HIGH (ref 0–20)

## 2021-06-29 MED ORDER — POTASSIUM CHLORIDE CRYS ER 20 MEQ PO TBCR
20.0000 meq | EXTENDED_RELEASE_TABLET | Freq: Once | ORAL | Status: AC
  Administered 2021-06-29: 20 meq via ORAL
  Filled 2021-06-29: qty 1

## 2021-06-29 MED ORDER — PIPERACILLIN-TAZOBACTAM 3.375 G IVPB
3.3750 g | Freq: Three times a day (TID) | INTRAVENOUS | Status: AC
  Administered 2021-06-29 – 2021-07-04 (×16): 3.375 g via INTRAVENOUS
  Filled 2021-06-29 (×17): qty 50

## 2021-06-29 MED ORDER — SODIUM CHLORIDE 0.9% FLUSH
3.0000 mL | Freq: Two times a day (BID) | INTRAVENOUS | Status: DC
  Administered 2021-06-29 – 2021-07-05 (×9): 3 mL via INTRAVENOUS

## 2021-06-29 MED ORDER — VANCOMYCIN HCL IN DEXTROSE 1-5 GM/200ML-% IV SOLN: 1000.0000 mg | Freq: Once | INTRAVENOUS | Status: DC

## 2021-06-29 MED ORDER — LACTATED RINGERS IV BOLUS (SEPSIS)
1000.0000 mL | Freq: Once | INTRAVENOUS | Status: AC
  Administered 2021-06-29: 1000 mL via INTRAVENOUS

## 2021-06-29 MED ORDER — VANCOMYCIN HCL 1750 MG/350ML IV SOLN
1750.0000 mg | Freq: Two times a day (BID) | INTRAVENOUS | Status: DC
  Administered 2021-06-30: 1750 mg via INTRAVENOUS
  Filled 2021-06-29 (×2): qty 350

## 2021-06-29 MED ORDER — LACTATED RINGERS IV SOLN: INTRAVENOUS | Status: DC

## 2021-06-29 MED ORDER — VANCOMYCIN HCL 2000 MG/400ML IV SOLN
2000.0000 mg | Freq: Once | INTRAVENOUS | Status: AC
  Administered 2021-06-29: 2000 mg via INTRAVENOUS
  Filled 2021-06-29 (×2): qty 400

## 2021-06-29 MED ORDER — LACTATED RINGERS IV BOLUS (SEPSIS): 1000.0000 mL | Freq: Once | INTRAVENOUS | Status: DC

## 2021-06-29 MED ORDER — PIPERACILLIN-TAZOBACTAM 3.375 G IVPB: 3.3750 g | Freq: Three times a day (TID) | INTRAVENOUS | Status: DC

## 2021-06-29 MED ORDER — DOCUSATE SODIUM 100 MG PO CAPS
200.0000 mg | ORAL_CAPSULE | Freq: Two times a day (BID) | ORAL | Status: DC
  Administered 2021-06-30 – 2021-07-05 (×9): 200 mg via ORAL
  Filled 2021-06-29 (×11): qty 2

## 2021-06-29 MED ORDER — INSULIN ASPART 100 UNIT/ML IJ SOLN
0.0000 [IU] | Freq: Three times a day (TID) | INTRAMUSCULAR | Status: DC
Start: 2021-06-30 — End: 2021-07-05
  Administered 2021-06-30 – 2021-07-03 (×3): 1 [IU] via SUBCUTANEOUS
  Administered 2021-07-04: 2 [IU] via SUBCUTANEOUS
  Administered 2021-07-05: 1 [IU] via SUBCUTANEOUS
  Filled 2021-06-29 (×5): qty 1

## 2021-06-29 MED ORDER — GADOBUTROL 1 MMOL/ML IV SOLN
10.0000 mL | Freq: Once | INTRAVENOUS | Status: AC | PRN
  Administered 2021-06-29: 10 mL via INTRAVENOUS

## 2021-06-29 MED ORDER — SODIUM CHLORIDE 0.9 % IV SOLN: 2.0000 g | Freq: Once | INTRAVENOUS | Status: DC

## 2021-06-29 MED ORDER — MORPHINE SULFATE (PF) 2 MG/ML IV SOLN: 1.0000 mg | INTRAVENOUS | Status: DC | PRN

## 2021-06-29 MED ORDER — ENOXAPARIN SODIUM 40 MG/0.4ML IJ SOSY
40.0000 mg | PREFILLED_SYRINGE | INTRAMUSCULAR | Status: DC
  Administered 2021-06-30 – 2021-07-04 (×5): 40 mg via SUBCUTANEOUS
  Filled 2021-06-29 (×6): qty 0.4

## 2021-06-29 MED ORDER — PIPERACILLIN-TAZOBACTAM 3.375 G IVPB 30 MIN: 3.3750 g | Freq: Once | INTRAVENOUS | Status: DC

## 2021-06-29 MED ORDER — ONDANSETRON HCL 4 MG/2ML IJ SOLN: 4.0000 mg | Freq: Three times a day (TID) | INTRAMUSCULAR | Status: DC | PRN

## 2021-06-29 MED ORDER — LACTATED RINGERS IV BOLUS (SEPSIS): 500.0000 mL | Freq: Once | INTRAVENOUS | Status: DC

## 2021-06-29 MED ORDER — OXYCODONE-ACETAMINOPHEN 5-325 MG PO TABS
1.0000 | ORAL_TABLET | Freq: Four times a day (QID) | ORAL | Status: DC | PRN
  Administered 2021-07-02 (×3): 1 via ORAL
  Filled 2021-06-29 (×3): qty 1

## 2021-06-29 MED ORDER — SODIUM CHLORIDE 0.9 % IV SOLN: INTRAVENOUS | Status: DC

## 2021-06-29 NOTE — ED Triage Notes (Signed)
Pt to ED via POV with co of a "hole in my foot, it smells rotten". Pt has had some toes amputated off both his feet in the past and has had a diabetic ulcer on the bottom of his foot that he states that is no longer there. He has a wound on the top of his right foot.

## 2021-06-29 NOTE — Consult Note (Signed)
Reason for Consult: Diabetic foot infection left foot Referring Physician: Marge Duncans, MD  [name] [name] is an 60 y.o. male.  HPI: Patient is a 60 year old male previously known to me for prior left and right foot surgery for osteomyelitis in May of this year.  He was lost follow-up and has not seen one for either foot since surgery.  Sutures are still in.  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[facility])     Past Surgical History:  Procedure Laterality Date   ACHILLES TENDON SURGERY Right 09/22/2016   Procedure: ACHILLES TENDON LENGTHENING;  Surgeon: Gwyneth Revels, DPM;  Location: [facility] ORS;  Service: Podiatry;  Laterality: Right;   AMPUTATION Right 01/14/2021   Procedure: AMPUTATION RAY;  Surgeon: Edwin Cap, DPM;  Location: [facility] ORS;  Service: Podiatry;  Laterality: Right;   AMPUTATION TOE Left 07/12/2015   Procedure: AMPUTATION TOE;  Surgeon: Gwyneth Revels, DPM;  Location: [facility] ORS;  Service: Podiatry;  Laterality: Left;   AMPUTATION TOE Right 09/22/2016   Procedure: AMPUTATION TOE;  Surgeon: Gwyneth Revels, DPM;  Location: [facility] ORS;  Service: Podiatry;  Laterality: Right;   INCISION AND DRAINAGE OF WOUND Bilateral 01/14/2021   Procedure: IRRIGATION AND DEBRIDEMENT WOUND;  Surgeon: Edwin Cap, DPM;  Location: [facility] ORS;  Service: Podiatry;  Laterality: Bilateral;   IRRIGATION AND DEBRIDEMENT FOOT Left 07/12/2015   Procedure: IRRIGATION AND DEBRIDEMENT FOOT;  Surgeon: Gwyneth Revels, DPM;  Location: [facility] ORS;  Service: Podiatry;  Laterality: Left;   IRRIGATION AND DEBRIDEMENT FOOT Left 09/06/2015   Procedure: IRRIGATION AND DEBRIDEMENT FOOT;  Surgeon: Recardo Evangelist, DPM;  Location: [facility] ORS;  Service: Podiatry;  Laterality: Left;    Family  History  Problem Relation Age of Onset   CVA Mother    CVA Brother    Diabetes Mellitus II Father     Social History:  reports that he has been smoking cigarettes. He has been smoking an average of .5 packs per day. He has never used smokeless tobacco. He reports current alcohol use of about 4.0 standard drinks per week. He reports current drug use.  Allergies: No Known Allergies  Medications: I have reviewed the patient's current medications.  Results for orders placed or performed during the hospital encounter of 06/29/21 (from the past 48 hour(s))  Lactic acid, plasma     Status: Abnormal   Collection Time: 06/29/21  1:53 PM  Result Value Ref Range   Lactic Acid, Venous 2.5 (HH) 0.5 - 1.9 mmol/L    Comment: CRITICAL RESULT CALLED TO, READ BACK BY AND VERIFIED WITH STEPHANIE RED 06/29/21 1425 MU Performed at [facility], [address] [place], [place] [address]   Comprehensive metabolic panel     Status: Abnormal   Collection Time: 06/29/21  1:53 PM  Result Value Ref Range   Sodium 134 (L) 135 - 145 mmol/L   Potassium 3.4 (L) 3.5 - 5.1 mmol/L   Chloride 95 (L) 98 - 111 mmol/L   CO2 24 22 - 32 mmol/L   Glucose, Bld 119 (H) 70 - 99 mg/dL    Comment: Glucose reference range applies only to samples taken after fasting for at least 8 hours.   BUN 6 (L)  8 - 23 mg/dL   Creatinine, Ser 2.87 0.61 - 1.24 mg/dL   Calcium 9.8 8.9 - 86.7 mg/dL   Total Protein 7.7 6.5 - 8.1 g/dL   Albumin 3.7 3.5 - 5.0 g/dL   AST 17 15 - 41 U/L   ALT 9 0 - 44 U/L   Alkaline Phosphatase 57 38 - 126 U/L   Total Bilirubin 1.1 0.3 - 1.2 mg/dL   GFR, Estimated >67 >20 mL/min    Comment: (NOTE) Calculated using the CKD-EPI Creatinine Equation (2021)    Anion gap 15 5 - 15    Comment: Performed at [facility], [address] [place], [place] [address]  CBC with Differential     Status: Abnormal   Collection Time: 06/29/21  1:53 PM  Result Value Ref Range   WBC 11.5 (H) 4.0 - 10.5  K/uL   RBC 4.06 (L) 4.22 - 5.81 MIL/uL   Hemoglobin 14.6 13.0 - 17.0 g/dL   HCT 62.8 36.6 - 29.4 %   MCV 104.4 (H) 80.0 - 100.0 fL   MCH 36.0 (H) 26.0 - 34.0 pg   MCHC 34.4 30.0 - 36.0 g/dL   RDW 76.5 46.5 - 03.5 %   Platelets 329 150 - 400 K/uL   nRBC 0.0 0.0 - 0.2 %   Neutrophils Relative % 75 %   Neutro Abs 8.7 (H) 1.7 - 7.7 K/uL   Lymphocytes Relative 8 %   Lymphs Abs 0.9 0.7 - 4.0 K/uL   Monocytes Relative 14 %   Monocytes Absolute 1.6 (H) 0.1 - 1.0 K/uL   Eosinophils Relative 1 %   Eosinophils Absolute 0.1 0.0 - 0.5 K/uL   Basophils Relative 1 %   Basophils Absolute 0.1 0.0 - 0.1 K/uL   Immature Granulocytes 1 %   Abs Immature Granulocytes 0.11 (H) 0.00 - 0.07 K/uL    Comment: Performed at [facility], [address], [place], [place] [address]    DG Foot Complete Left  Result Date: 06/29/2021 CLINICAL DATA:  Assess for osteomyelitis. Status post first ray amputation. EXAM: LEFT FOOT - COMPLETE 3+ VIEW COMPARISON:  01/14/21 FINDINGS: There is diffuse soft tissue swelling. Signs of previous first ray amputation at the level of the mid shaft of the metatarsal bone. Compared with the previous exam there has been progressive fragmentation and demineralization involving the distal aspect of the remaining portions of the first metatarsal bone. Soft tissue ulcer is noted within the overlying soft tissues and there is gas within the subcutaneous soft tissues adjacent to the bone. Ankylosis of the second MTP joint is noted. Heel fracture deformities involving the distal shaft of the third metatarsal bone and fifth proximal phalanx noted. IMPRESSION: 1. Progressive fragmentation and demineralization involving the distal aspect of the first metatarsal bone at the amputation site compatible with osteomyelitis. 2. Soft tissue ulcer overlying the first metatarsal bone is noted with. gas in the underlying subcutaneous soft tissues adjacent to the bone compatible with soft tissue  infection. 3. Ankylosis of the second MTP joint. Electronically Signed   By: Signa Kell M.D.   On: 06/29/2021 14:18    Review of Systems  Constitutional:  Positive for malaise/fatigue.       Loss of appetite  All other systems reviewed and are negative. Blood pressure (!) 158/89, pulse (!) 114, temperature 99 F (37.2 C), temperature source Oral, resp. rate 20, height 6\' 4"  (1.93 m), weight 108 kg, SpO2 97 %.  Vitals:   06/29/21 1322  BP: (!) 158/89  Pulse: (!) 114  Resp: 20  Temp: 99 F (37.2 C)  SpO2: 97%    General AA&O x3. Normal mood and affect.  Vascular Palpable pulses cap fill time brisk  Neurologic Epicritic sensation grossly absent.  Dermatologic (Wound) Suture still present both feet.  No plantar ulcerations large callus.  There is a dorsal ulceration the left foot near the first metatarsal has purulent drainage and malodor cellulitis left foot up to the mid leg.  No cellulitis right leg  Orthopedic: Motor intact BLE.    Assessment/Plan:  Diabetic foot infection left foot, abscess, and osteomyelitis left foot -Imaging: Studies independently reviewed -Antibiotics: Labs and vitals concerning for sepsis recommend broad-spectrum antibiotics for now -Recommend ID consult on the floor -WB Status: WBAT bilateral for now -With verbal consent and following sterile prep I performed a bedside I&D using a scissor and forceps to expand the draining wound.  This probes directly to bone and a loose bone fragment was extruded from the wound.  Purulent drainage was noted and this was cultured and sent for microbiology.  I irrigated it thoroughly with sterile saline and packed with iodoform packing.  Sterile dressing applied. -Bilateral foot x-rays ordered -MRI of the left foot ordered, if concerning changes on right foot x-ray will order right foot MRI -Possible surgery tomorrow n.p.o. after midnight, pending progress may take Friday instead  Edwin Cap 06/29/2021, 6:49 PM    Best available via secure chat for questions or concerns.

## 2021-06-29 NOTE — Sepsis Progress Note (Signed)
Confirmed with bedside RNs [place] and Amy via secure chat that blood cultures were not drawn prior to the antibiotic dosing.

## 2021-06-29 NOTE — Consult Note (Signed)
PHARMACY -  BRIEF ANTIBIOTIC NOTE   Pharmacy has received consult(s) for Vancomycin from an ED provider.  The patient's profile has been reviewed for ht/wt/allergies/indication/available labs.    One time order(s) placed for Vancomycin 2g IV x 1 dose.  Further antibiotics/pharmacy consults should be ordered by admitting physician if indicated.                       Thank you, Bettey Costa 06/29/2021  5:04 PM

## 2021-06-29 NOTE — ED Provider Notes (Addendum)
[facility] Emergency Department Provider Note   ____________________________________________   Event Date/Time   First MD Initiated Contact with Patient 06/29/21 1651     (approximate)  I have reviewed the triage vital signs and the nursing notes.   HISTORY  Chief Complaint Wound Infection    HPI [name] [name] is a 60 y.o. male who reports he has had an ulcer in his foot and for the last 4 days or so he has been having increasing  discharge from the foot.  He denies a lot of pain.  He has no pain elsewhere is not running a fever and otherwise feels okay.  In the waiting room he is running a temperature of 99 blood pressure is good but his heart rate is 114.         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[facility])     Patient Active Problem List   Diagnosis Date Noted   Diabetic foot infection (HCC)    Diabetic foot ulcer (HCC) 01/11/2021   Tobacco use disorder    Peripheral vascular disease (HCC)    Acute osteomyelitis of toe of right foot (HCC) 09/19/2016   Infection, Proteus 09/08/2015   Anemia 09/08/2015   Cocaine abuse (HCC) 09/08/2015   Osteomyelitis (HCC) 09/03/2015   Hypokalemia 07/14/2015   Leukocytosis 07/14/2015   Anemia of chronic disease 07/14/2015   Tobacco abuse 07/14/2015   SIRS (systemic inflammatory response syndrome) (HCC) 07/11/2015   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    Past Surgical History:  Procedure Laterality Date   ACHILLES TENDON SURGERY Right 09/22/2016   Procedure: ACHILLES TENDON LENGTHENING;  Surgeon: Gwyneth Revels, DPM;  Location: [facility] ORS;  Service: Podiatry;  Laterality: Right;   AMPUTATION Right 01/14/2021   Procedure: AMPUTATION RAY;  Surgeon: Edwin Cap, DPM;  Location: [facility] ORS;  Service: Podiatry;  Laterality: Right;   AMPUTATION TOE Left 07/12/2015   Procedure: AMPUTATION TOE;   Surgeon: Gwyneth Revels, DPM;  Location: [facility] ORS;  Service: Podiatry;  Laterality: Left;   AMPUTATION TOE Right 09/22/2016   Procedure: AMPUTATION TOE;  Surgeon: Gwyneth Revels, DPM;  Location: [facility] ORS;  Service: Podiatry;  Laterality: Right;   INCISION AND DRAINAGE OF WOUND Bilateral 01/14/2021   Procedure: IRRIGATION AND DEBRIDEMENT WOUND;  Surgeon: Edwin Cap, DPM;  Location: [facility] ORS;  Service: Podiatry;  Laterality: Bilateral;   IRRIGATION AND DEBRIDEMENT FOOT Left 07/12/2015   Procedure: IRRIGATION AND DEBRIDEMENT FOOT;  Surgeon: Gwyneth Revels, DPM;  Location: [facility] ORS;  Service: Podiatry;  Laterality: Left;   IRRIGATION AND DEBRIDEMENT FOOT Left 09/06/2015   Procedure: IRRIGATION AND DEBRIDEMENT FOOT;  Surgeon: Recardo Evangelist, DPM;  Location: [facility] ORS;  Service: Podiatry;  Laterality: Left;    Prior to Admission medications   Medication Sig Start Date End Date Taking? Authorizing Provider  nicotine (NICODERM CQ - DOSED IN MG/24 HOURS) 14 mg/24hr patch Place 1 patch (14 mg total) onto the skin daily. Patient not taking: Reported on 01/12/2021 09/08/15   Katharina Caper, MD  polyethylene glycol (MIRALAX / GLYCOLAX) packet Take 17 g by mouth daily as needed for mild constipation. Patient not taking: Reported on 01/12/2021 09/08/15   Katharina Caper, MD    Allergies Patient has no known allergies.  Family History  Problem Relation Age of Onset   CVA Mother    CVA Brother  Diabetes Mellitus II Father     Social History Social History   Tobacco Use   Smoking status: Every Day    Packs/day: 0.50    Types: Cigarettes   Smokeless tobacco: Never  Substance Use Topics   Alcohol use: Yes    Alcohol/week: 4.0 standard drinks    Types: 4 Cans of beer per week    Comment: daily   Drug use: Yes    Comment: cocaine occasionally    Review of Systems  Constitutional: No fever/chills Eyes: No visual changes. ENT: No sore throat. Cardiovascular: Denies chest pain. Respiratory:  Denies shortness of breath. Gastrointestinal: No abdominal pain.  No nausea, no vomiting.  No diarrhea.  No constipation. Genitourinary: Negative for dysuria. Musculoskeletal: Negative for back pain. Skin: Negative for rash. Neurological: Negative for headaches, focal weakness  ____________________________________________   PHYSICAL EXAM:  VITAL SIGNS: ED Triage Vitals  Enc Vitals Group     BP 06/29/21 1322 (!) 158/89     Pulse Rate 06/29/21 1322 (!) 114     Resp 06/29/21 1322 20     Temp 06/29/21 1322 99 F (37.2 C)     Temp Source 06/29/21 1322 Oral     SpO2 06/29/21 1322 97 %     Weight 06/29/21 1324 238 lb (108 kg)     Height 06/29/21 1324 6\' 4"  (1.93 m)     Head Circumference --      Peak Flow --      Pain Score 06/29/21 1324 6     Pain Loc --      Pain Edu? --      Excl. in GC? --     Constitutional: Alert and oriented. Well appearing and in no acute distress. Eyes: Conjunctivae are normal. Head: Atraumatic. Nose: No congestion/rhinnorhea. Mouth/Throat: Mucous membranes are moist.  Oropharynx non-erythematous. Neck: No stridor.  Cardiovascular: Normal rate, regular rhythm. Grossly normal heart sounds.  Good peripheral circulation. Respiratory: Normal respiratory effort.  No retractions. Lungs CTAB. Gastrointestinal: Soft and nontender. No distention. No abdominal bruits.  Musculoskeletal: No left lower extremity tenderness nor edema.  Right foot is swollen and dark the area where the amputation was is gray.  About 2 to 3 cm of gray from the medial edge of the foot inward and about 4 cm from the distal tip of the foot proximally about 2 cm surrounding that are black and necrotic looking.  The foot itself is swollen warm and dark up to at least the ankle. Neurologic:  Normal speech and language. No gross focal neurologic deficits are appreciated.  Skin:  Skin is warm, dry and intact except for foot. No rash noted.   ____________________________________________    LABS (all labs ordered are listed, but only abnormal results are displayed)  Labs Reviewed  LACTIC ACID, PLASMA - Abnormal; Notable for the following components:      Result Value   Lactic Acid, Venous 2.5 (*)    All other components within normal limits  COMPREHENSIVE METABOLIC PANEL - Abnormal; Notable for the following components:   Sodium 134 (*)    Potassium 3.4 (*)    Chloride 95 (*)    Glucose, Bld 119 (*)    BUN 6 (*)    All other components within normal limits  CBC WITH DIFFERENTIAL/PLATELET - Abnormal; Notable for the following components:   WBC 11.5 (*)    RBC 4.06 (*)    MCV 104.4 (*)    MCH 36.0 (*)    Neutro Abs  8.7 (*)    Monocytes Absolute 1.6 (*)    Abs Immature Granulocytes 0.11 (*)    All other components within normal limits  CULTURE, BLOOD (SINGLE)  RESP PANEL BY RT-PCR (FLU A&B, COVID) [id number]  LACTIC ACID, PLASMA  URINALYSIS, ROUTINE W REFLEX MICROSCOPIC  SEDIMENTATION RATE   ____________________________________________  EKG   ____________________________________________  RADIOLOGY Jill Poling, personally viewed and evaluated these images (plain radiographs) as part of my medical decision making, as well as reviewing the written report by the radiologist.  ED MD interpretation: Radiology reads film as likely osteomyelitis with some soft tissue gas.  I reviewed the films and agree completely  Official radiology report(s): DG Foot Complete Left  Result Date: 06/29/2021 CLINICAL DATA:  Assess for osteomyelitis. Status post first ray amputation. EXAM: LEFT FOOT - COMPLETE 3+ VIEW COMPARISON:  01/14/21 FINDINGS: There is diffuse soft tissue swelling. Signs of previous first ray amputation at the level of the mid shaft of the metatarsal bone. Compared with the previous exam there has been progressive fragmentation and demineralization involving the distal aspect of the remaining portions of the first metatarsal bone. Soft tissue ulcer is noted  within the overlying soft tissues and there is gas within the subcutaneous soft tissues adjacent to the bone. Ankylosis of the second MTP joint is noted. Heel fracture deformities involving the distal shaft of the third metatarsal bone and fifth proximal phalanx noted. IMPRESSION: 1. Progressive fragmentation and demineralization involving the distal aspect of the first metatarsal bone at the amputation site compatible with osteomyelitis. 2. Soft tissue ulcer overlying the first metatarsal bone is noted with. gas in the underlying subcutaneous soft tissues adjacent to the bone compatible with soft tissue infection. 3. Ankylosis of the second MTP joint. Electronically Signed   By: Signa Kell M.D.   On: 06/29/2021 14:18    ____________________________________________   PROCEDURES  Procedure(s) performed (including Critical Care): Critical care time 20 minutes.  This includes reviewing his old records in some detail and discussing his care with Dr. Lilian Kapur podiatry and the hospitalist both.  Additionally I moved him to some weight myself and then reexamine his feet.  Procedures   ____________________________________________   INITIAL IMPRESSION / ASSESSMENT AND PLAN / ED COURSE ----------------------------------------- 5:22 PM on 06/29/2021 -----------------------------------------  Patient with diabetic foot infection seen in the waiting room.  I was not able to examine both of his feet in great detail because we were in the waiting room but I did take off his boot and shoe off the left foot which she indicated was the foot with the problem.  Saw the description I made above.  There is a note from the nurses says his right foot also has an ulcer on the dorsum.  We are attempting to get the patient into bed and change into account IV started antibiotic started etc. and then I can look at both his feet in greater detail.    ----------------------------------------- 6:22 PM on  06/29/2021 ----------------------------------------- Dr. Teresita Madura asked for x-ray of his right foot which I just put in.         ____________________________________________   FINAL CLINICAL IMPRESSION(S) / ED DIAGNOSES  Final diagnoses:  Sepsis, due to unspecified organism, unspecified whether acute organ dysfunction present [facility])  Diabetic infection of left foot [facility])     ED Discharge Orders     None        Note:  This document was prepared using Dragon voice recognition software and may  include unintentional dictation errors.    Arnaldo Natal, MD 06/29/21 1722    Arnaldo Natal, MD 06/29/21 1742    Arnaldo Natal, MD 06/29/21 Rickey Primus

## 2021-06-29 NOTE — Sepsis Progress Note (Signed)
eLink is monitoring this Code Sepsis. °

## 2021-06-29 NOTE — Progress Notes (Signed)
CODE SEPSIS - PHARMACY COMMUNICATION  **Broad Spectrum Antibiotics should be administered within 1 hour of Sepsis diagnosis**  Time Code Sepsis Called/Page Received: 1709  Antibiotics Ordered: vancomycin 2,000 mg and Zosyn 3.375  Time of 1st antibiotic administration: 1800  Additional action taken by pharmacy: None  If necessary, Name of Provider/Nurse Contacted: N/a    Jaynie Bream, PharmD Pharmacy Resident  06/29/2021 6:25 PM

## 2021-06-29 NOTE — ED Notes (Signed)
Patient transported to MRI 

## 2021-06-29 NOTE — H&P (Signed)
History and Physical    [name] [name] WIO:[id number] DOB: [birth year] DOA: 06/29/2021  PCP: Patient, No Pcp Per (Inactive)  Patient coming from: home    Chief Complaint: LLE pain & swelling  HPI: 60 y/o M w/ PMH of DM2, cigar smoker who presents w/ LLE pain and swelling x 1 day. The pain is sharp, constant w/o radiation. Walking makes the pain worse and nothing makes the pain better. Pt did not try taking any NSAIDs or tylenol. The severity of the pain 6/10. Pt denies any fevers, chills, sweating, cough, chest pain, shortness of breath, nausea, vomiting, abd pain, dysuria, diarrhea or constipation. Pt c/o urinary frequency and urgency x 1 year. Of note, pt lives alone in a house w/o electricity but does have running water. Pt does not have a PCP, podiatrist or any outpatient physicians      Review of Systems: As per HPI otherwise 14 point review of systems negative.    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[facility])     Past Surgical History:  Procedure Laterality Date   ACHILLES TENDON SURGERY Right 09/22/2016   Procedure: ACHILLES TENDON LENGTHENING;  Surgeon: Gwyneth Revels, DPM;  Location: [facility] ORS;  Service: Podiatry;  Laterality: Right;   AMPUTATION Right 01/14/2021   Procedure: AMPUTATION RAY;  Surgeon: Edwin Cap, DPM;  Location: [facility];  Service: Podiatry;  Laterality: Right;   AMPUTATION TOE Left 07/12/2015   Procedure: AMPUTATION TOE;  Surgeon: Gwyneth Revels, DPM;  Location: [facility];  Service: Podiatry;  Laterality: Left;   AMPUTATION TOE Right 09/22/2016   Procedure: AMPUTATION TOE;  Surgeon: Gwyneth Revels, DPM;  Location: [facility];  Service: Podiatry;  Laterality: Right;   INCISION AND DRAINAGE OF WOUND Bilateral 01/14/2021   Procedure: IRRIGATION AND DEBRIDEMENT WOUND;  Surgeon: Edwin Cap, DPM;  Location: [facility];  Service: Podiatry;   Laterality: Bilateral;   IRRIGATION AND DEBRIDEMENT FOOT Left 07/12/2015   Procedure: IRRIGATION AND DEBRIDEMENT FOOT;  Surgeon: Gwyneth Revels, DPM;  Location: [facility];  Service: Podiatry;  Laterality: Left;   IRRIGATION AND DEBRIDEMENT FOOT Left 09/06/2015   Procedure: IRRIGATION AND DEBRIDEMENT FOOT;  Surgeon: Recardo Evangelist, DPM;  Location: [facility];  Service: Podiatry;  Laterality: Left;     reports that he has been smoking cigarettes. He has been smoking an average of .5 packs per day. He has never used smokeless tobacco. He reports current alcohol use of about 4.0 standard drinks per week. He reports current drug use.  No Known Allergies  Family History  Problem Relation Age of Onset   CVA Mother    CVA Brother    Diabetes Mellitus II Father      Prior to Admission medications   Medication Sig Start Date End Date Taking? Authorizing Provider  nicotine (NICODERM CQ - DOSED IN MG/24 HOURS) 14 mg/24hr patch Place 1 patch (14 mg total) onto the skin daily. Patient not taking: Reported on 01/12/2021 09/08/15   Katharina Caper, MD  polyethylene glycol (MIRALAX / GLYCOLAX) packet Take 17 g by mouth daily as needed for mild constipation. Patient not taking: Reported on 01/12/2021 09/08/15   Katharina Caper, MD    Physical Exam: Vitals:   06/29/21 1322 06/29/21 1324  BP: (!) 158/89   Pulse: (!) 114   Resp: 20   Temp: 99 F (37.2 C)   TempSrc:  Oral   SpO2: 97%   Weight:  108 kg  Height:  6\' 4"  (1.93 m)    Constitutional: NAD, calm but uncomfortable. Disheveled  Vitals:   06/29/21 1322 06/29/21 1324  BP: (!) 158/89   Pulse: (!) 114   Resp: 20   Temp: 99 F (37.2 C)   TempSrc: Oral   SpO2: 97%   Weight:  108 kg  Height:  6\' 4"  (1.93 m)   Eyes: PERRL, lids and conjunctivae normal ENMT: Mucous membranes are moist.  Neck: normal, supple Respiratory: clear to auscultation bilaterally, no wheezing, no crackles. Normal respiratory effort. No accessory muscle use.   Cardiovascular: S1/S2+. No rubs / gallops. B/l LE edema L>R Abdomen: soft, no tenderness, no-distended. Bowel sounds positive.  Musculoskeletal: no clubbing / cyanosis. Good ROM. Normal muscle tone.  Skin: LLE is edematous, tender to palpation & foul smelling  Neurologic: CN 2-12 grossly intact. Moves all extremities  Psychiatric: Normal judgment and insight. Alert and oriented x 3. Normal mood.     Labs on Admission: I have personally reviewed following labs and imaging studies  CBC: Recent Labs  Lab 06/29/21 1353  WBC 11.5*  NEUTROABS 8.7*  HGB 14.6  HCT 42.4  MCV 104.4*  PLT 329   Basic Metabolic Panel: Recent Labs  Lab 06/29/21 1353  NA 134*  K 3.4*  CL 95*  CO2 24  GLUCOSE 119*  BUN 6*  CREATININE 0.71  CALCIUM 9.8   GFR: Estimated Creatinine Clearance: 129.1 mL/min (by C-G formula based on SCr of 0.71 mg/dL). Liver Function Tests: Recent Labs  Lab 06/29/21 1353  AST 17  ALT 9  ALKPHOS 57  BILITOT 1.1  PROT 7.7  ALBUMIN 3.7   No results for input(s): LIPASE, AMYLASE in the last 168 hours. No results for input(s): AMMONIA in the last 168 hours. Coagulation Profile: No results for input(s): INR, PROTIME in the last 168 hours. Cardiac Enzymes: No results for input(s): CKTOTAL, CKMB, CKMBINDEX, TROPONINI in the last 168 hours. BNP (last 3 results) No results for input(s): PROBNP in the last 8760 hours. HbA1C: No results for input(s): HGBA1C in the last 72 hours. CBG: No results for input(s): GLUCAP in the last 168 hours. Lipid Profile: No results for input(s): CHOL, HDL, LDLCALC, TRIG, CHOLHDL, LDLDIRECT in the last 72 hours. Thyroid Function Tests: No results for input(s): TSH, T4TOTAL, FREET4, T3FREE, THYROIDAB in the last 72 hours. Anemia Panel: No results for input(s): VITAMINB12, FOLATE, FERRITIN, TIBC, IRON, RETICCTPCT in the last 72 hours. Urine analysis:    Component Value Date/Time   COLORURINE YELLOW (A) 01/11/2021 1608   APPEARANCEUR  CLEAR (A) 01/11/2021 1608   LABSPEC 1.014 01/11/2021 1608   PHURINE 5.0 01/11/2021 1608   GLUCOSEU NEGATIVE 01/11/2021 1608   HGBUR NEGATIVE 01/11/2021 1608   BILIRUBINUR NEGATIVE 01/11/2021 1608   KETONESUR NEGATIVE 01/11/2021 1608   PROTEINUR NEGATIVE 01/11/2021 1608   NITRITE NEGATIVE 01/11/2021 1608   LEUKOCYTESUR NEGATIVE 01/11/2021 1608    Radiological Exams on Admission: DG Foot Complete Left  Result Date: 06/29/2021 CLINICAL DATA:  Assess for osteomyelitis. Status post first ray amputation. EXAM: LEFT FOOT - COMPLETE 3+ VIEW COMPARISON:  01/14/21 FINDINGS: There is diffuse soft tissue swelling. Signs of previous first ray amputation at the level of the mid shaft of the metatarsal bone. Compared with the previous exam there has been progressive fragmentation and demineralization involving the distal aspect of the remaining portions of the first metatarsal bone. Soft tissue ulcer is noted within  the overlying soft tissues and there is gas within the subcutaneous soft tissues adjacent to the bone. Ankylosis of the second MTP joint is noted. Heel fracture deformities involving the distal shaft of the third metatarsal bone and fifth proximal phalanx noted. IMPRESSION: 1. Progressive fragmentation and demineralization involving the distal aspect of the first metatarsal bone at the amputation site compatible with osteomyelitis. 2. Soft tissue ulcer overlying the first metatarsal bone is noted with. gas in the underlying subcutaneous soft tissues adjacent to the bone compatible with soft tissue infection. 3. Ankylosis of the second MTP joint. Electronically Signed   By: Signa Kell M.D.   On: 06/29/2021 14:18    EKG: Independently reviewed.  Assessment/Plan Active Problems:   Sepsis (HCC) Sepsis: present on admission. Meets criteria w/ tachycardia, leukocytosis and & left foot osteomyelitics. Blood cxs ordered. Continue on IV vanco, cefepime. Procal ordered. Lactic acid is elevated. Continue  on IVFs  Left foot osteomyelitis: as per left foot XR. Continue on IV vanco, zosyn. Podiatry consulted, Dr. Lilian Kapur. NPO after midnight as pt will likely have to go to the OR. B/l LE [place] to r/o DVTs ordered. Hx of previous toe amputations   Leukocytosis: secondary to above infection. Continue on IV abxs  Lactic acidosis: continue on IVFs. Repeat lactic acid ordered  DM2: likely poorly controlled. Started on SSI w/ accuchecks. HbA1c ordered  Hypokalemia: KCl repleated  Cigar smoker: smoking cessation counseling.    DVT prophylaxis: lovenox  Code Status: full  Family Communication:  Disposition Plan: unclear  Consults called: podiatry, Dr. Lilian Kapur, called by ER physician  Admission status: inpatient    Charise Killian MD [facility]   If 7PM-7AM, please contact night-coverage   06/29/2021, 5:22 PM

## 2021-06-29 NOTE — Progress Notes (Signed)
Pharmacy Antibiotic Note  [name] [name] is a 60 y.o. male admitted on 06/29/2021 with left foot  osteomyelitis .  Pharmacy has been consulted for vancomycin dosing. PMH includes T2DM, smoking.   Xray of left foot compatible with osteomyelitis. While in ED, was tachycardic, with temp of 18F. Zosyn and vancomycin 2,000 mg LD x 1 given in ED. SCR < 1 c/w baseline.  Plan: Maintenance dose vancomycin 1750 mg every 12 hours Goal AUC 400-600; Scr used: 0.8 Calculated AUC 441.5 Cmax 31.9 Cmin 11.5  Continue Zosyn 3.375 grams every 8 hours. Monitor renal function, clinical course and LOT.   Height: 6\' 4"  (193 cm) Weight: 108 kg (238 lb) IBW/kg (Calculated) : 86.8  Temp (24hrs), Avg:99 F (37.2 C), Min:99 F (37.2 C), Max:99 F (37.2 C)  Recent Labs  Lab 06/29/21 1353  WBC 11.5*  CREATININE 0.71  LATICACIDVEN 2.5*    Estimated Creatinine Clearance: 129.1 mL/min (by C-G formula based on SCr of 0.71 mg/dL).    No Known Allergies  Antimicrobials this admission: 11/2 Zosyn >>  11/2 Vancomycin >>   Dose adjustments this admission: None  Microbiology results: 11/2 BCx: sent   Thank you for allowing pharmacy to be a part of this patient's care.  13/2, PharmD Pharmacy Resident  06/29/2021 5:41 PM

## 2021-06-30 ENCOUNTER — Encounter: Payer: Self-pay | Admitting: Internal Medicine

## 2021-06-30 DIAGNOSIS — L97509 Non-pressure chronic ulcer of other part of unspecified foot with unspecified severity: Secondary | ICD-10-CM

## 2021-06-30 DIAGNOSIS — E11621 Type 2 diabetes mellitus with foot ulcer: Secondary | ICD-10-CM

## 2021-06-30 LAB — BASIC METABOLIC PANEL
Anion gap: 11 (ref 5–15)
BUN: 5 mg/dL — ABNORMAL LOW (ref 8–23)
CO2: 25 mmol/L (ref 22–32)
Calcium: 8.7 mg/dL — ABNORMAL LOW (ref 8.9–10.3)
Chloride: 99 mmol/L (ref 98–111)
Creatinine, Ser: 0.68 mg/dL (ref 0.61–1.24)
GFR, Estimated: >60 mL/min (ref >60)
Glucose, Bld: 91 mg/dL (ref 70–99)
Potassium: 3.1 mmol/L — ABNORMAL LOW (ref 3.5–5.1)
Sodium: 135 mmol/L (ref 135–145)

## 2021-06-30 LAB — CBC
HCT: 33.7 % — ABNORMAL LOW (ref 39.0–52.0)
Hemoglobin: 11.5 g/dL — ABNORMAL LOW (ref 13.0–17.0)
MCH: 35.6 pg — ABNORMAL HIGH (ref 26.0–34.0)
MCHC: 34.1 g/dL (ref 30.0–36.0)
MCV: 104.3 fL — ABNORMAL HIGH (ref 80.0–100.0)
Platelets: 302 10*3/uL (ref 150–400)
RBC: 3.23 MIL/uL — ABNORMAL LOW (ref 4.22–5.81)
RDW: 13 % (ref 11.5–15.5)
WBC: 7.2 10*3/uL (ref 4.0–10.5)
nRBC: 0 % (ref 0.0–0.2)

## 2021-06-30 LAB — PROCALCITONIN: Procalcitonin: <0.1 ng/mL

## 2021-06-30 LAB — GLUCOSE, CAPILLARY
Glucose-Capillary: 87 mg/dL (ref 70–99)
Glucose-Capillary: 93 mg/dL (ref 70–99)
Glucose-Capillary: 107 mg/dL — ABNORMAL HIGH (ref 70–99)
Glucose-Capillary: 135 mg/dL — ABNORMAL HIGH (ref 70–99)
Glucose-Capillary: 139 mg/dL — ABNORMAL HIGH (ref 70–99)

## 2021-06-30 LAB — HEMOGLOBIN A1C
Hgb A1c MFr Bld: 5.3 % (ref 4.8–5.6)
Mean Plasma Glucose: 105.41 mg/dL

## 2021-06-30 MED ORDER — VANCOMYCIN HCL 1750 MG/350ML IV SOLN
1750.0000 mg | Freq: Once | INTRAVENOUS | Status: AC
  Administered 2021-07-01: 1750 mg via INTRAVENOUS
  Filled 2021-06-30: qty 350

## 2021-06-30 MED ORDER — POTASSIUM CHLORIDE CRYS ER 20 MEQ PO TBCR
40.0000 meq | EXTENDED_RELEASE_TABLET | Freq: Two times a day (BID) | ORAL | Status: AC
  Administered 2021-06-30 (×2): 40 meq via ORAL
  Filled 2021-06-30 (×2): qty 2

## 2021-06-30 MED ORDER — LINEZOLID 600 MG PO TABS
600.0000 mg | ORAL_TABLET | Freq: Two times a day (BID) | ORAL | Status: DC
Start: 2021-07-01 — End: 2021-07-01
  Filled 2021-06-30 (×2): qty 1

## 2021-06-30 NOTE — Consult Note (Signed)
NAME: [name] [name]  DOB: 1960-12-09  MRN: [id number]  Date/Time: 06/30/2021 8:33 PM  REQUESTING PROVIDER: Dr.Williams Subjective:  REASON FOR CONSULT: left foot infection ? [name] [name] is a 60 y.o. with a history of DM , left great toe amputation, rt toe amputation not on any medications presents with left foot wound and smelly discharge Pt lives on his own  and does not know when he got the infection of the foot- he noted 2 days before presenting to the hospital some swelling and then foul discharge and came to the ED on 06/29/21 Int eh ED vitals BP 129/77, temp 98.2, HR 103, WBC 11.5, cr 0.71 Plt 329  He was started on vanco and zosyn after cultures were sent Podiatrist saw himand did I/D at [facility] and sent cultures and bone fragment which was extruding from the wound for pathology MRI was ordered of the left foot showed Active osteomyelitis of the remaining first metatarsal. There is a large adjacent medial ulceration extending to a debris-filled cavity abutting the distal amputation margin of the first metatarsal, along with scattered loculations of gas in the surrounding soft tissues indicating infection, and extensive surrounding cellulitis. He last was hospitalized 5/17-5/23 for left foot infection and underwent partial resection of first metatarsal. He also had rt third metatarsal resection. He was treated while in the hospital with vanco and unasyn and as culture came positive for proteus and the surgery was deemed curative he was sent on 2 weeks of PO augmentin and to follow up with podiatrist as OP. He never went for his visit and during this admission podiatrist saw that the sutures were still present from the prior surgery  I am asked to see the patient for management of the infection  Past Medical History:  Diagnosis Date   Anemia of chronic disease    Diabetes mellitus without complication ([place])    Tobacco use disorder    Toe gangrene [facility])     Past Surgical History:   Procedure Laterality Date   ACHILLES TENDON SURGERY Right 09/22/2016   Procedure: ACHILLES TENDON LENGTHENING;  Surgeon: Samara Deist, DPM;  Location: [facility] ORS;  Service: Podiatry;  Laterality: Right;   AMPUTATION Right 01/14/2021   Procedure: AMPUTATION RAY;  Surgeon: Criselda Peaches, DPM;  Location: [facility] ORS;  Service: Podiatry;  Laterality: Right;   AMPUTATION TOE Left 07/12/2015   Procedure: AMPUTATION TOE;  Surgeon: Samara Deist, DPM;  Location: [facility] ORS;  Service: Podiatry;  Laterality: Left;   AMPUTATION TOE Right 09/22/2016   Procedure: AMPUTATION TOE;  Surgeon: Samara Deist, DPM;  Location: [facility] ORS;  Service: Podiatry;  Laterality: Right;   INCISION AND DRAINAGE OF WOUND Bilateral 01/14/2021   Procedure: IRRIGATION AND DEBRIDEMENT WOUND;  Surgeon: Criselda Peaches, DPM;  Location: [facility] ORS;  Service: Podiatry;  Laterality: Bilateral;   IRRIGATION AND DEBRIDEMENT FOOT Left 07/12/2015   Procedure: IRRIGATION AND DEBRIDEMENT FOOT;  Surgeon: Samara Deist, DPM;  Location: [facility] ORS;  Service: Podiatry;  Laterality: Left;   IRRIGATION AND DEBRIDEMENT FOOT Left 09/06/2015   Procedure: IRRIGATION AND DEBRIDEMENT FOOT;  Surgeon: Albertine Patricia, DPM;  Location: [facility] OR[facility];  Service: Podiatry;  Laterality: Left;    Social History   Socioeconomic History   Marital status: Single    Spouse name: Not on file   Number of children: Not on file   Years of education: Not on file   Highest education level: Not on file  Occupational History   Not on file  Tobacco  Use   Smoking status: Every Day    Packs/day: 0.50    Types: Cigarettes   Smokeless tobacco: Never  Substance and Sexual Activity   Alcohol use: Yes    Alcohol/week: 4.0 standard drinks    Types: 4 Cans of beer per week    Comment: daily   Drug use: Yes    Comment: cocaine occasionally   Sexual activity: Not on file  Other Topics Concern   Not on file  Social History Narrative   Lives at home with brother. Independent, working  currently at [employer].   Social Determinants of Health   Financial Resource Strain: Not on file  Food Insecurity: Not on file  Transportation Needs: Not on file  Physical Activity: Not on file  Stress: Not on file  Social Connections: Not on file  Intimate Partner Violence: Not on file    Family History  Problem Relation Age of Onset   CVA Mother    CVA Brother    Diabetes Mellitus II Father    No Known Allergies I? Current Facility-Administered Medications  Medication Dose Route Frequency Provider Last Rate Last Admin   0.9 %  sodium chloride infusion   Intravenous Continuous Charise KillianWilliams, Jamiese M, MD 75 mL/hr at 06/30/21 0834 Rate Change at 06/30/21 0834   docusate sodium (COLACE) capsule 200 mg  200 mg Oral BID Charise KillianWilliams, Jamiese M, MD       enoxaparin (LOVENOX) injection 40 mg  40 mg Subcutaneous Q24H Charise KillianWilliams, Jamiese M, MD       insulin aspart (novoLOG) injection 0-9 Units  0-9 Units Subcutaneous TID WC Charise KillianWilliams, Jamiese M, MD   1 Units at 06/30/21 1832   lactated ringers bolus 1,000 mL  1,000 mL Intravenous Once Arnaldo NatalMalinda, Paul F, MD       And   lactated ringers bolus 500 mL  500 mL Intravenous Once Arnaldo NatalMalinda, Paul F, MD       Melene Muller[START ON 07/01/2021] linezolid (ZYVOX) tablet 600 mg  600 mg Oral Q12H Serrita Lueth, Rhodia AlbrightJayashree, MD       morphine 2 MG/ML injection 1 mg  1 mg Intravenous Q4H PRN Charise KillianWilliams, Jamiese M, MD       ondansetron [facility](ZOFRAN) injection 4 mg  4 mg Intravenous Q8H PRN Charise KillianWilliams, Jamiese M, MD       oxyCODONE-acetaminophen (PERCOCET/ROXICET) 5-325 MG per tablet 1 tablet  1 tablet Oral Q6H PRN Charise KillianWilliams, Jamiese M, MD       piperacillin-tazobactam (ZOSYN) IVPB 3.375 g  3.375 g Intravenous Q8H Charise KillianWilliams, Jamiese M, MD 12.5 mL/hr at 06/30/21 1347 3.375 g at 06/30/21 1347   potassium chloride SA (KLOR-CON) CR tablet 40 mEq  40 mEq Oral BID Charise KillianWilliams, Jamiese M, MD   40 mEq at 06/30/21 0834   sodium chloride flush (NS) 0.9 % injection 3 mL  3 mL Intravenous Q12H Charise KillianWilliams,  Jamiese M, MD   3 mL at 06/30/21 0834   vancomycin (VANCOREADY) IVPB 1750 mg/350 mL  1,750 mg Intravenous Carolin SicksQ12H Nohea Kras, MD 175 mL/hr at 06/30/21 0835 1,750 mg at 06/30/21 0835     Abtx:  Anti-infectives (From admission, onward)    Start     Dose/Rate Route Frequency Ordered Stop   07/01/21 1000  linezolid (ZYVOX) tablet 600 mg        600 mg Oral Every 12 hours 06/30/21 1623     06/30/21 1000  vancomycin (VANCOREADY) IVPB 1750 mg/350 mL        1,750 mg 175 mL/hr over 120  Minutes Intravenous Every 12 hours 06/29/21 1828 06/30/21 2359   06/30/21 0600  piperacillin-tazobactam (ZOSYN) IVPB 3.375 g  Status:  Discontinued        3.375 g 12.5 mL/hr over 240 Minutes Intravenous Every 8 hours 06/29/21 1748 06/29/21 1751   06/29/21 2200  piperacillin-tazobactam (ZOSYN) IVPB 3.375 g  Status:  Discontinued        3.375 g 12.5 mL/hr over 240 Minutes Intravenous Every 8 hours 06/29/21 1731 06/29/21 1748   06/29/21 1800  piperacillin-tazobactam (ZOSYN) IVPB 3.375 g        3.375 g 12.5 mL/hr over 240 Minutes Intravenous Every 8 hours 06/29/21 1751     06/29/21 1715  vancomycin (VANCOREADY) IVPB 2000 mg/400 mL        2,000 mg 200 mL/hr over 120 Minutes Intravenous  Once 06/29/21 1703 06/29/21 2248   06/29/21 1700  vancomycin (VANCOCIN) IVPB 1000 mg/200 mL premix  Status:  Discontinued        1,000 mg 200 mL/hr over 60 Minutes Intravenous  Once 06/29/21 1653 06/29/21 1703   06/29/21 1700  cefTRIAXone (ROCEPHIN) 2 g in sodium chloride 0.9 % 100 mL IVPB  Status:  Discontinued        2 g 200 mL/hr over 30 Minutes Intravenous  Once 06/29/21 1653 06/29/21 1653   06/29/21 1700  piperacillin-tazobactam (ZOSYN) IVPB 3.375 g  Status:  Discontinued        3.375 g 100 mL/hr over 30 Minutes Intravenous  Once 06/29/21 1653 06/29/21 1751       REVIEW OF SYSTEMS:  Const: negative fever, negative chills, negative weight loss Eyes: negative diplopia or visual changes, negative eye pain ENT:  negative coryza, negative sore throat Resp: negative cough, hemoptysis, dyspnea Cards: negative for chest pain, palpitations, lower extremity edema GU: negative for frequency, dysuria and hematuria GI: Negative for abdominal pain, diarrhea, bleeding, constipation Skin: negative for rash and pruritus Heme: negative for easy bruising and gum/nose bleeding MS: negative for myalgias, arthralgias, back pain and muscle weakness Neurolo:negative for headaches, dizziness, vertigo, memory problems  Psych: negative for feelings of anxiety, depression  Endocrine: negative for thyroid, diabetes Allergy/Immunology- negative for any medication or food allergies ?: Objective:  VITALS:  BP 134/75 (BP Location: Right Arm)   Pulse 98   Temp 98.5 F (36.9 C) (Oral)   Resp 16   Ht 6\' 4"  (1.93 m)   Wt 108 kg   SpO2 100%   BMI 28.97 kg/m  PHYSICAL EXAM:  General: Alert, cooperative, no distress, appears stated age.  Head: Normocephalic, without obvious abnormality, atraumatic. Eyes: Conjunctivae clear, anicteric sclerae. Pupils are equal ENT Nares normal. No drainage or sinus tenderness. Lips, mucosa, and tongue normal. No Thrush Poor dentition Neck: Supple, symmetrical, no adenopathy, thyroid: non tender no carotid bruit and no JVD. Back: No CVA tenderness. Lungs: Clear to auscultation bilaterally. No Wheezing or Rhonchi. No rales. Heart: Regular rate and rhythm, no murmur, rub or gallop. Abdomen: Soft, non-tender,not distended. Bowel sounds normal. No masses Extremities: left foot- swollen, discoloration great toe amputation site has some discharge and the wound is packed       Rt foot   Skin: No rashes or lesions. Or bruising Lymph: Cervical, supraclavicular normal. Neurologic: Grossly non-focal Pertinent Labs Lab Results CBC    Component Value Date/Time   WBC 7.2 06/30/2021 0509   RBC 3.23 (L) 06/30/2021 0509   HGB 11.5 (L) 06/30/2021 0509   HGB 12.1 (L) 07/14/2012 1726    HCT 33.7 (L)  06/30/2021 0509   HCT 35.9 (L) 07/14/2012 1726   PLT 302 06/30/2021 0509   PLT 310 07/14/2012 1726   MCV 104.3 (H) 06/30/2021 0509   MCV 100 07/14/2012 1726   MCH 35.6 (H) 06/30/2021 0509   MCHC 34.1 06/30/2021 0509   RDW 13.0 06/30/2021 0509   RDW 11.8 07/14/2012 1726   LYMPHSABS 0.9 06/29/2021 1353   MONOABS 1.6 (H) 06/29/2021 1353   EOSABS 0.1 06/29/2021 1353   BASOSABS 0.1 06/29/2021 1353    CMP Latest Ref Rng & Units 06/30/2021 06/29/2021 01/15/2021  Glucose 70 - 99 mg/dL 91 119(H) 120(H)  BUN 8 - 23 mg/dL 5(L) 6(L) 15  Creatinine 0.61 - 1.24 mg/dL 0.68 0.71 0.69  Sodium 135 - 145 mmol/L 135 134(L) 136  Potassium 3.5 - 5.1 mmol/L 3.1(L) 3.4(L) 4.3  Chloride 98 - 111 mmol/L 99 95(L) 103  CO2 22 - 32 mmol/L 25 24 25   Calcium 8.9 - 10.3 mg/dL 8.7(L) 9.8 9.1  Total Protein 6.5 - 8.1 g/dL - 7.7 -  Total Bilirubin 0.3 - 1.2 mg/dL - 1.1 -  Alkaline Phos 38 - 126 U/L - 57 -  AST 15 - 41 U/L - 17 -  ALT 0 - 44 U/L - 9 -      Microbiology: Recent Results (from the past 240 hour(s))  Resp Panel by RT-PCR (Flu A&B, Covid) Nasopharyngeal Swab     Status: None   Collection Time: 06/29/21  5:20 PM   Specimen: Nasopharyngeal Swab; Nasopharyngeal(NP) swabs in vial transport medium  Result Value Ref Range Status   SARS Coronavirus 2 by RT PCR NEGATIVE NEGATIVE Final    Comment: (NOTE) SARS-CoV-2 target nucleic acids are NOT DETECTED.  The SARS-CoV-2 RNA is generally detectable in upper respiratory specimens during the acute phase of infection. The lowest concentration of SARS-CoV-2 viral copies this assay can detect is 138 copies/mL. A negative result does not preclude SARS-Cov-2 infection and should not be used as the sole basis for treatment or other patient management decisions. A negative result may occur with  improper specimen collection/handling, submission of specimen other than nasopharyngeal swab, presence of viral mutation(s) within the areas targeted by  this assay, and inadequate number of viral copies(<138 copies/mL). A negative result must be combined with clinical observations, patient history, and epidemiological information. The expected result is Negative.  Fact Sheet for Patients:  EntrepreneurPulse.com.au  Fact Sheet for Healthcare Providers:  IncredibleEmployment.be  This test is no t yet approved or cleared by the [place] FDA and  has been authorized for detection and/or diagnosis of SARS-CoV-2 by FDA under an Emergency Use Authorization (EUA). This EUA will remain  in effect (meaning this test can be used) for the duration of the COVID-19 declaration under Section 564(b)(1) of the Act, 21 U.S.C.section 360bbb-3(b)(1), unless the authorization is terminated  or revoked sooner.       Influenza A by PCR NEGATIVE NEGATIVE Final   Influenza B by PCR NEGATIVE NEGATIVE Final    Comment: (NOTE) The Xpert Xpress SARS-CoV-2/FLU/RSV plus assay is intended as an aid in the diagnosis of influenza from Nasopharyngeal swab specimens and should not be used as a sole basis for treatment. Nasal washings and aspirates are unacceptable for Xpert Xpress SARS-CoV-2/FLU/RSV testing.  Fact Sheet for Patients: EntrepreneurPulse.com.au  Fact Sheet for Healthcare Providers: IncredibleEmployment.be  This test is not yet approved or cleared by the [place] FDA and has been authorized for detection and/or diagnosis of SARS-CoV-2 by FDA under an  Emergency Use Authorization (EUA). This EUA will remain in effect (meaning this test can be used) for the duration of the COVID-19 declaration under Section 564(b)(1) of the Act, 21 U.S.C. section 360bbb-3(b)(1), unless the authorization is terminated or revoked.  Performed at [facility], [address] [place], [place] [address]   CULTURE, BLOOD (ROUTINE X 2) w Reflex to ID Panel     Status: None  (Preliminary result)   Collection Time: 06/29/21  6:36 PM   Specimen: BLOOD  Result Value Ref Range Status   Specimen Description BLOOD LEFT ANTECUBITAL  Final   Special Requests   Final    BOTTLES DRAWN AEROBIC AND ANAEROBIC Blood Culture adequate volume   Culture   Final    NO GROWTH < 24 HOURS Performed at [facility], [address], [place], [place] [address]    Report Status PENDING  Incomplete  CULTURE, BLOOD (ROUTINE X 2) w Reflex to ID Panel     Status: None (Preliminary result)   Collection Time: 06/29/21  6:36 PM   Specimen: BLOOD  Result Value Ref Range Status   Specimen Description BLOOD BLOOD LEFT HAND  Final   Special Requests   Final    BOTTLES DRAWN AEROBIC ONLY Blood Culture results may not be optimal due to an inadequate volume of blood received in culture bottles   Culture   Final    NO GROWTH < 24 HOURS Performed at [facility], [address] [place], [place] [address]    Report Status PENDING  Incomplete  Aerobic/Anaerobic Culture w Gram Stain (surgical/deep wound)     Status: None (Preliminary result)   Collection Time: 06/29/21  7:50 PM   Specimen: Wound  Result Value Ref Range Status   Specimen Description   Final    WOUND Performed at [facility], [address] [place], [place] [address]    Special Requests   Final    LEFT FOOT Performed at [facility], [address] [place], [place] [address]    Gram Stain   Final    NO SQUAMOUS EPITHELIAL CELLS SEEN NO WBC SEEN FEW GRAM POSITIVE COCCI Performed at [facility], [address], [place], [place] [address]    Culture PENDING  Incomplete   Report Status PENDING  Incomplete    IMAGING RESULTS: MRI foot reviewed MRI of the left foot showed Active osteomyelitis of the remaining first metatarsal. There is a large adjacent medial ulceration extending to a debris-filled cavity abutting the distal amputation margin of the first metatarsal, along  with scattered loculations of gas in the surrounding soft tissues indicating infection, and extensive surrounding cellulitis. I have personally reviewed the films ? Impression/Recommendation ? Diabetic foot infection- left foot at the site of previous first  ray excision site  Abscess and surrounding cellulitis with underlying osteomyelitis  of the remaining first metatarsal S/p bedside I/D and packing Cultures sent and opending Currently on vanco and zosyn To avoid renal injury change vanco to linezolid Will de-escalate once we get the cultures back  DM- - says he was never given any meds Hba1c 5.3 on 06/30/21 So wonder whether the foot wounds have a vascualr component  Anemia  Discussed the management with the patient in detail ? ? ___________________________________________________ Discussed with patient, requesting provider Note:  This document was prepared using Dragon voice recognition software and may include unintentional dictation errors.

## 2021-06-30 NOTE — Progress Notes (Signed)
PROGRESS NOTE    [name] [name]  XVQ:[id number] DOB: 03/10/1961 DOA: 06/29/2021 PCP: Pcp, No   Assessment & Plan:   Active Problems:   Diabetic infection of left foot (HCC)   Sepsis (HCC)   Sepsis: present on admission. Meets criteria w/ tachycardia, leukocytosis and & left foot osteomyelitics. Blood cxs ordered. Continue on IV vanco, cefepime. Procal ordered. Lactic acid is elevated. Continue on IVFs   Left foot osteomyelitis: as per left foot XR. Continue on IV vanco, zosyn. B/l LE [place] was neg for DVTs. MRI left foot shows active osteomyelitis of the remaining first metatarsal, large medial ulceration w/ scattered loculations of gas in the surrounding soft tissues. Wound cx growing gram positive cocci, sens pending. Will go for surg tomorrow as per podiatry. NPO after midnight. Hx of previous toe amputations. Will consult ID    Leukocytosis: resolved    Lactic acidosis: resolved    DM2: well controlled, HbA1c 5.3. Continue on SSI w/ accuchecks    Hypokalemia:  potassium given    Cigar smoker: smoking cessation counseling    DVT prophylaxis: lovenox  Code Status: full  Family Communication:  Disposition Plan: unclear   Level of care: Med-Surg  Status is: Inpatient  Remains inpatient appropriate because: severity of illness, going for left foot surg tomorrow     Consultants:  Podiatry  ID  Procedures:   Antimicrobials: vanco, zosyn    Subjective: Pt c/o left foot pain   Objective: Vitals:   06/29/21 2050 06/29/21 2301 06/30/21 0453 06/30/21 0724  BP: 115/77 129/77 130/68 128/73  Pulse: (!) 101 (!) 103 96 95  Resp: 16 18 18 15   Temp:  98.2 F (36.8 C) 99.2 F (37.3 C) 98.4 F (36.9 C)  TempSrc:  Oral Oral   SpO2: 95% 94% 93% 97%  Weight:      Height:        Intake/Output Summary (Last 24 hours) at 06/30/2021 0737 Last data filed at 06/30/2021 0417 Gross per 24 hour  Intake 2000 ml  Output 1000 ml  Net 1000 ml   Filed Weights   06/29/21 1324   Weight: 108 kg    Examination:  General exam: Appears calm and comfortable  Respiratory system: Clear to auscultation. Respiratory effort normal. Cardiovascular system: S1 & S2+. No rubs, gallops or clicks.  Gastrointestinal system: Abdomen is nondistended, soft and nontender. Normal bowel sounds heard. Central nervous system: Alert and oriented. Moves all extremities  Psychiatry: Judgement and insight appear normal. Flat mood and affect    Data Reviewed: I have personally reviewed following labs and imaging studies  CBC: Recent Labs  Lab 06/29/21 1353 06/30/21 0509  WBC 11.5* 7.2  NEUTROABS 8.7*  --   HGB 14.6 11.5*  HCT 42.4 33.7*  MCV 104.4* 104.3*  PLT 329 302   Basic Metabolic Panel: Recent Labs  Lab 06/29/21 1353 06/30/21 0509  NA 134* 135  K 3.4* 3.1*  CL 95* 99  CO2 24 25  GLUCOSE 119* 91  BUN 6* 5*  CREATININE 0.71 0.68  CALCIUM 9.8 8.7*   GFR: Estimated Creatinine Clearance: 129.1 mL/min (by C-G formula based on SCr of 0.68 mg/dL). Liver Function Tests: Recent Labs  Lab 06/29/21 1353  AST 17  ALT 9  ALKPHOS 57  BILITOT 1.1  PROT 7.7  ALBUMIN 3.7   No results for input(s): LIPASE, AMYLASE in the last 168 hours. No results for input(s): AMMONIA in the last 168 hours. Coagulation Profile: No results for input(s): INR,  PROTIME in the last 168 hours. Cardiac Enzymes: No results for input(s): CKTOTAL, CKMB, CKMBINDEX, TROPONINI in the last 168 hours. BNP (last 3 results) No results for input(s): PROBNP in the last 8760 hours. HbA1C: No results for input(s): HGBA1C in the last 72 hours. CBG: Recent Labs  Lab 06/30/21 0726  GLUCAP 87   Lipid Profile: No results for input(s): CHOL, HDL, LDLCALC, TRIG, CHOLHDL, LDLDIRECT in the last 72 hours. Thyroid Function Tests: No results for input(s): TSH, T4TOTAL, FREET4, T3FREE, THYROIDAB in the last 72 hours. Anemia Panel: No results for input(s): VITAMINB12, FOLATE, FERRITIN, TIBC, IRON,  RETICCTPCT in the last 72 hours. Sepsis Labs: Recent Labs  Lab 06/29/21 1353 06/29/21 1830 06/30/21 0509  PROCALCITON  --  <0.10 <0.10  LATICACIDVEN 2.5* 1.6  --     Recent Results (from the past 240 hour(s))  Resp Panel by RT-PCR (Flu A&B, Covid) Nasopharyngeal Swab     Status: None   Collection Time: 06/29/21  5:20 PM   Specimen: Nasopharyngeal Swab; Nasopharyngeal(NP) swabs in vial transport medium  Result Value Ref Range Status   SARS Coronavirus 2 by RT PCR NEGATIVE NEGATIVE Final    Comment: (NOTE) SARS-CoV-2 target nucleic acids are NOT DETECTED.  The SARS-CoV-2 RNA is generally detectable in upper respiratory specimens during the acute phase of infection. The lowest concentration of SARS-CoV-2 viral copies this assay can detect is 138 copies/mL. A negative result does not preclude SARS-Cov-2 infection and should not be used as the sole basis for treatment or other patient management decisions. A negative result may occur with  improper specimen collection/handling, submission of specimen other than nasopharyngeal swab, presence of viral mutation(s) within the areas targeted by this assay, and inadequate number of viral copies(<138 copies/mL). A negative result must be combined with clinical observations, patient history, and epidemiological information. The expected result is Negative.  Fact Sheet for Patients:  BloggerCourse.com  Fact Sheet for Healthcare Providers:  SeriousBroker.it  This test is no t yet approved or cleared by the [place] FDA and  has been authorized for detection and/or diagnosis of SARS-CoV-2 by FDA under an Emergency Use Authorization (EUA). This EUA will remain  in effect (meaning this test can be used) for the duration of the COVID-19 declaration under Section 564(b)(1) of the Act, 21 U.S.C.section 360bbb-3(b)(1), unless the authorization is terminated  or revoked sooner.        Influenza A by PCR NEGATIVE NEGATIVE Final   Influenza B by PCR NEGATIVE NEGATIVE Final    Comment: (NOTE) The Xpert Xpress SARS-CoV-2/FLU/RSV plus assay is intended as an aid in the diagnosis of influenza from Nasopharyngeal swab specimens and should not be used as a sole basis for treatment. Nasal washings and aspirates are unacceptable for Xpert Xpress SARS-CoV-2/FLU/RSV testing.  Fact Sheet for Patients: BloggerCourse.com  Fact Sheet for Healthcare Providers: SeriousBroker.it  This test is not yet approved or cleared by the [place] FDA and has been authorized for detection and/or diagnosis of SARS-CoV-2 by FDA under an Emergency Use Authorization (EUA). This EUA will remain in effect (meaning this test can be used) for the duration of the COVID-19 declaration under Section 564(b)(1) of the Act, 21 U.S.C. section 360bbb-3(b)(1), unless the authorization is terminated or revoked.  Performed at [facility], [address] [place], [place] [address]   Aerobic/Anaerobic Culture w Gram Stain (surgical/deep wound)     Status: None (Preliminary result)   Collection Time: 06/29/21  7:50 PM   Specimen: Wound  Result Value Ref Range Status   Specimen Description   Final    WOUND Performed at [facility], [address], [place], [place] [address]    Special Requests   Final    LEFT FOOT Performed at [facility], [address] [place], [place] [address]    Gram Stain   Final    NO SQUAMOUS EPITHELIAL CELLS SEEN NO WBC SEEN FEW GRAM POSITIVE COCCI Performed at [facility], [address], [place], [place] [address]    Culture PENDING  Incomplete   Report Status PENDING  Incomplete         Radiology Studies: US Venous Img Lower Bilateral (DVT)  Result Date: 06/29/2021 CLINICAL DATA:  Bilateral lower extremity swelling. EXAM: BILATERAL LOWER EXTREMITY VENOUS DOPPLER ULTRASOUND  TECHNIQUE: Gray-scale sonography with graded compression, as well as color Doppler and duplex ultrasound were performed to evaluate the lower extremity deep venous systems from the level of the common femoral vein and including the common femoral, femoral, profunda femoral, popliteal and calf veins including the posterior tibial, peroneal and gastrocnemius veins when visible. The superficial great saphenous vein was also interrogated. Spectral Doppler was utilized to evaluate flow at rest and with distal augmentation maneuvers in the common femoral, femoral and popliteal veins. COMPARISON:  None. FINDINGS: RIGHT LOWER EXTREMITY Common Femoral Vein: No evidence of thrombus. Normal compressibility, respiratory phasicity and response to augmentation. Saphenofemoral Junction: No evidence of thrombus. Normal compressibility and flow on color Doppler imaging. Profunda Femoral Vein: No evidence of thrombus. Normal compressibility and flow on color Doppler imaging. Femoral Vein: No evidence of thrombus. Normal compressibility, respiratory phasicity and response to augmentation. Popliteal Vein: No evidence of thrombus. Normal compressibility, respiratory phasicity and response to augmentation. Calf Veins: No evidence of thrombus. Normal compressibility and flow on color Doppler imaging. Superficial Great Saphenous Vein: No evidence of thrombus. Normal compressibility. Venous Reflux:  None. Other Findings:  None. LEFT LOWER EXTREMITY Common Femoral Vein: No evidence of thrombus. Normal compressibility, respiratory phasicity and response to augmentation. Saphenofemoral Junction: No evidence of thrombus. Normal compressibility and flow on color Doppler imaging. Profunda Femoral Vein: No evidence of thrombus. Normal compressibility and flow on color Doppler imaging. Femoral Vein: No evidence of thrombus. Normal compressibility, respiratory phasicity and response to augmentation. Popliteal Vein: No evidence of thrombus. Normal  compressibility, respiratory phasicity and response to augmentation. Calf Veins: No evidence of thrombus. Normal compressibility and flow on color Doppler imaging. Superficial Great Saphenous Vein: No evidence of thrombus. Normal compressibility. Venous Reflux:  None. Other Findings:  None. IMPRESSION: No evidence of deep venous thrombosis in either lower extremity. Electronically Signed   By: Aram Candela M.D.   On: 06/29/2021 20:15   DG Foot 2 Views Left  Result Date: 06/29/2021 CLINICAL DATA:  Diabetic ulcer. EXAM: LEFT FOOT - 2 VIEW COMPARISON:  June 29, 2021 (1:55 p.m.) FINDINGS: Transmetatarsal amputation of the left great toe is again seen. Since the prior study there is been interval removal of a cortical fragment noted adjacent to the remainder of the first left metatarsal. A 16 mm x 12 mm area of soft tissue air is now seen within this region. Stable chronic changes are noted along the distal aspects of the second and third left metatarsals. Moderate severity diffuse soft tissue swelling is seen. IMPRESSION: 1. Interval removal of a cortical fragment seen adjacent to the remaining portion of the first left metatarsal on the prior study. 2. Small area of residual soft tissue  air is seen within this region. 3. Diffuse soft tissue swelling Electronically Signed   By: Aram Candela M.D.   On: 06/29/2021 19:52   DG Foot Complete Left  Result Date: 06/29/2021 CLINICAL DATA:  Assess for osteomyelitis. Status post first ray amputation. EXAM: LEFT FOOT - COMPLETE 3+ VIEW COMPARISON:  01/14/21 FINDINGS: There is diffuse soft tissue swelling. Signs of previous first ray amputation at the level of the mid shaft of the metatarsal bone. Compared with the previous exam there has been progressive fragmentation and demineralization involving the distal aspect of the remaining portions of the first metatarsal bone. Soft tissue ulcer is noted within the overlying soft tissues and there is gas within the  subcutaneous soft tissues adjacent to the bone. Ankylosis of the second MTP joint is noted. Heel fracture deformities involving the distal shaft of the third metatarsal bone and fifth proximal phalanx noted. IMPRESSION: 1. Progressive fragmentation and demineralization involving the distal aspect of the first metatarsal bone at the amputation site compatible with osteomyelitis. 2. Soft tissue ulcer overlying the first metatarsal bone is noted with. gas in the underlying subcutaneous soft tissues adjacent to the bone compatible with soft tissue infection. 3. Ankylosis of the second MTP joint. Electronically Signed   By: Signa Kell M.D.   On: 06/29/2021 14:18   DG Foot Complete Right  Result Date: 06/29/2021 CLINICAL DATA:  Diabetic ulcer. EXAM: RIGHT FOOT COMPLETE - 3+ VIEW COMPARISON:  Jan 14, 2021 FINDINGS: Transmetatarsal amputation of the second right toe is seen with postoperative changes noted along the distal aspect of the third right metatarsal and proximal third right foot. The radiopaque surgical material seen in between the third right metatarsal and remainder of the third right toe on the prior study are no longer present. There is no evidence of an acute fracture or dislocation. Mild degenerative changes are seen along the dorsal aspect of the mid right foot. A large plantar calcaneal spur is present. An area of soft tissue ulceration is seen along the distal tip of the right great toe. There is no definite evidence of associated cortical destruction. IMPRESSION: Soft ulceration along the distal tip of the right great toe without definite evidence of osteomyelitis. MRI correlation is recommended if this remains of clinical concern. Electronically Signed   By: Aram Candela M.D.   On: 06/29/2021 19:44        Scheduled Meds:  docusate sodium  200 mg Oral BID   enoxaparin (LOVENOX) injection  40 mg Subcutaneous Q24H   insulin aspart  0-9 Units Subcutaneous TID WC   sodium chloride  flush  3 mL Intravenous Q12H   Continuous Infusions:  sodium chloride 100 mL/hr at 06/29/21 2356   lactated ringers     And   lactated ringers     piperacillin-tazobactam (ZOSYN)  IV 3.375 g (06/30/21 0417)   vancomycin       LOS: 1 day    Time spent: 33 mins     Charise Killian, MD [facility] Pager [phone]  If 7PM-7AM, please contact night-coverage 06/30/2021, 7:37 AM

## 2021-06-30 NOTE — TOC Initial Note (Signed)
Transition of Care [facility]) - Initial/Assessment Note    Patient Details  Name: [name] [name] MRN: [id number] Date of Birth: 02-21-1961  Transition of Care [facility]) CM/SW Contact:    Caryn Section, RN Phone Number: 06/30/2021, 12:03 PM  Clinical Narrative:  Patient lives alone, states his brother assists with transportation to appointments and to the pharmacy.  Patient denies concerns with medication compliance.    Patient stated he has Medicaid, but upon having admissions dept search, patient only has family planning medicaid.  This would make him ineligible for charity home health at this time. Financial services contacted for consult to oversee medicaid application.    TOC contact information provided to patient, tOC to follow to discharge.                 Expected Discharge Plan: Home w Home Health Services Barriers to Discharge: Inadequate or no insurance, Continued Medical Work up   Patient Goals and CMS Choice     Choice offered to / list presented to : NA  Expected Discharge Plan and Services Expected Discharge Plan: Home w Home Health Services In-house Referral: Artist, PCP / [facility] Discharge Planning Services: CM Consult Post Acute Care Choice:  (TBD would like Home Health) Living arrangements for the past 2 months: Single Family Home                 DME Arranged:  (TBD)         HH Arranged:  (See narrative notes)          Prior Living Arrangements/Services Living arrangements for the past 2 months: Single Family Home Lives with:: Self Patient language and need for interpreter reviewed:: Yes (Interpreter not required) Do you feel safe going back to the place where you live?: Yes      Need for Family Participation in Patient Care: Yes (Comment) Care giver support system in place?: Yes (comment) (Brother assists with transportation, does not live with patient) Current home services:  (none at this time.) Criminal Activity/Legal Involvement  Pertinent to Current Situation/Hospitalization: No - Comment as needed  Activities of Daily Living Home Assistive Devices/Equipment: None ADL Screening (condition at time of admission) Patient's cognitive ability adequate to safely complete daily activities?: Yes Is the patient deaf or have difficulty hearing?: No Does the patient have difficulty seeing, even when wearing glasses/contacts?: No Does the patient have difficulty concentrating, remembering, or making decisions?: No Patient able to express need for assistance with ADLs?: Yes Does the patient have difficulty dressing or bathing?: No Independently performs ADLs?: Yes (appropriate for developmental age) Does the patient have difficulty walking or climbing stairs?: No Weakness of Legs: None Weakness of Arms/Hands: None  Permission Sought/Granted Permission sought to share information with : Case Manager Permission granted to share information with : Yes, Verbal Permission Granted              Emotional Assessment Appearance:: Appears stated age Attitude/Demeanor/Rapport: Gracious, Engaged Affect (typically observed): Pleasant, Appropriate Orientation: : Oriented to Self, Oriented to Place, Oriented to  Time, Oriented to Situation Alcohol / Substance Use: Not Applicable Psych Involvement: No (comment)  Admission diagnosis:  Diabetic infection of left foot (HCC) [I96.789, L08.9] Sepsis (HCC) [A41.9] Acute osteomyelitis of metatarsal bone of left foot (HCC) [M86.172] Swelling of lower extremity [M79.89] Sepsis, due to unspecified organism, unspecified whether acute organ dysfunction present [facility]) [A41.9] Patient Active Problem List   Diagnosis Date Noted   Sepsis (HCC) 06/29/2021   Acute osteomyelitis of  metatarsal bone of left foot (HCC)    Diabetic infection of left foot (HCC)    Diabetic foot ulcer (HCC) 01/11/2021   Tobacco use disorder    Peripheral vascular disease (HCC)    Acute osteomyelitis of toe of right  foot (HCC) 09/19/2016   Infection, Proteus 09/08/2015   Anemia 09/08/2015   Cocaine abuse (HCC) 09/08/2015   Osteomyelitis (HCC) 09/03/2015   Hypokalemia 07/14/2015   Leukocytosis 07/14/2015   Anemia of chronic disease 07/14/2015   Tobacco abuse 07/14/2015   SIRS (systemic inflammatory response syndrome) (HCC) 07/11/2015   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   PCP:  Oneita Hurt, No Pharmacy:   [facility] [place], [place] - [place], [place] - [address] [place] [address] Phone: [phone] Fax: [phone]  [facility] - [place], [place] - [address] [place] [address] Phone: [phone] Fax: [phone]     Social Determinants of Health (SDOH) Interventions    Readmission Risk Interventions No flowsheet data found.

## 2021-06-30 NOTE — Progress Notes (Signed)
  Subjective:  Patient ID: [name] [name], male    DOB: [birth year],  MRN: [id number]  A 60 y.o. male presents with right foot osteotmyelitis s/p beside I&D by Dr. Lilian Kapur. He states that he is doing well. No systemic signs of infection. No redness noted.   Objective:   Vitals:   06/30/21 0724 06/30/21 1507  BP: 128/73 129/79  Pulse: 95 97  Resp: 15 15  Temp: 98.4 F (36.9 C) 98.6 F (37 C)  SpO2: 97% 100%   General AA&O x3. Normal mood and affect.  Vascular Dorsalis pedis and posterior tibial pulses very faintly palpable Brisk capillary diminished Pedal hair not present  Neurologic Epicritic sensation grossly intact.  Dermatologic Left  partial first ray amputation wound. No purulent drainage noted. Probes to bone. Mild malodor present. No open wounds or lesions noted on the right side   Orthopedic: MMT 5/5 in dorsiflexion, plantarflexion, inversion, and eversion. Normal joint ROM without pain or crepitus.     Assessment & Plan:  Patient was evaluated and treated and all questions answered.  Left foot osteomyelitis s/p bedside I&D washout packed open -All questions and concerns were addressed  -Mri reviewed which showed osteomyelitis of the remaninig first metatarsal.  -NPO after midnight -Plan for OR tomorrow with Dr. Logan Bores for left foot partial first ray amputation with bone biopsy of second metatarsal and medial cuneiform vs transmetatarsal amputation -He will benefit from ABI/ PVR as well. He did not bleed during dressing change today. -NWB LLE -Iodoform packing daily   Candelaria Stagers, DPM  Accessible via secure chat for questions or concerns.

## 2021-06-30 NOTE — H&P (View-Only) (Signed)
  Subjective:  Patient ID: [name] [name], male    DOB: [birth year],  MRN: [id number]  A 60 y.o. male presents with right foot osteotmyelitis s/p beside I&D by Dr. Lilian Kapur. He states that he is doing well. No systemic signs of infection. No redness noted.   Objective:   Vitals:   06/30/21 0724 06/30/21 1507  BP: 128/73 129/79  Pulse: 95 97  Resp: 15 15  Temp: 98.4 F (36.9 C) 98.6 F (37 C)  SpO2: 97% 100%   General AA&O x3. Normal mood and affect.  Vascular Dorsalis pedis and posterior tibial pulses very faintly palpable Brisk capillary diminished Pedal hair not present  Neurologic Epicritic sensation grossly intact.  Dermatologic Left  partial first ray amputation wound. No purulent drainage noted. Probes to bone. Mild malodor present. No open wounds or lesions noted on the right side   Orthopedic: MMT 5/5 in dorsiflexion, plantarflexion, inversion, and eversion. Normal joint ROM without pain or crepitus.     Assessment & Plan:  Patient was evaluated and treated and all questions answered.  Left foot osteomyelitis s/p bedside I&D washout packed open -All questions and concerns were addressed  -Mri reviewed which showed osteomyelitis of the remaninig first metatarsal.  -NPO after midnight -Plan for OR tomorrow with Dr. Logan Bores for left foot partial first ray amputation with bone biopsy of second metatarsal and medial cuneiform vs transmetatarsal amputation -He will benefit from ABI/ PVR as well. He did not bleed during dressing change today. -NWB LLE -Iodoform packing daily   Candelaria Stagers, DPM  Accessible via secure chat for questions or concerns.

## 2021-07-01 ENCOUNTER — Encounter: Payer: Self-pay | Admitting: Internal Medicine

## 2021-07-01 ENCOUNTER — Inpatient Hospital Stay: Payer: Medicaid Other

## 2021-07-01 ENCOUNTER — Inpatient Hospital Stay: Payer: Medicaid Other | Admitting: Anesthesiology

## 2021-07-01 ENCOUNTER — Encounter
Admission: EM | Disposition: A | Discharge status: Home Health | Payer: Self-pay | Source: Home / Self Care | Attending: Internal Medicine

## 2021-07-01 DIAGNOSIS — M86172 Other acute osteomyelitis, left ankle and foot: Secondary | ICD-10-CM

## 2021-07-01 HISTORY — PX: AMPUTATION: SHX166

## 2021-07-01 HISTORY — PX: BONE BIOPSY: SHX375

## 2021-07-01 LAB — BASIC METABOLIC PANEL
Anion gap: 7 (ref 5–15)
BUN: 8 mg/dL (ref 8–23)
CO2: 24 mmol/L (ref 22–32)
Calcium: 8.8 mg/dL — ABNORMAL LOW (ref 8.9–10.3)
Chloride: 105 mmol/L (ref 98–111)
Creatinine, Ser: 0.8 mg/dL (ref 0.61–1.24)
GFR, Estimated: >60 mL/min (ref >60)
Glucose, Bld: 106 mg/dL — ABNORMAL HIGH (ref 70–99)
Potassium: 4 mmol/L (ref 3.5–5.1)
Sodium: 136 mmol/L (ref 135–145)

## 2021-07-01 LAB — CBC
HCT: 34.7 % — ABNORMAL LOW (ref 39.0–52.0)
Hemoglobin: 12 g/dL — ABNORMAL LOW (ref 13.0–17.0)
MCH: 36.5 pg — ABNORMAL HIGH (ref 26.0–34.0)
MCHC: 34.6 g/dL (ref 30.0–36.0)
MCV: 105.5 fL — ABNORMAL HIGH (ref 80.0–100.0)
Platelets: 327 10*3/uL (ref 150–400)
RBC: 3.29 MIL/uL — ABNORMAL LOW (ref 4.22–5.81)
RDW: 12.9 % (ref 11.5–15.5)
WBC: 4 10*3/uL (ref 4.0–10.5)
nRBC: 0 % (ref 0.0–0.2)

## 2021-07-01 LAB — GLUCOSE, CAPILLARY
Glucose-Capillary: 105 mg/dL — ABNORMAL HIGH (ref 70–99)
Glucose-Capillary: 103 mg/dL — ABNORMAL HIGH (ref 70–99)
Glucose-Capillary: 83 mg/dL (ref 70–99)
Glucose-Capillary: 95 mg/dL (ref 70–99)
Glucose-Capillary: 181 mg/dL — ABNORMAL HIGH (ref 70–99)

## 2021-07-01 LAB — SURGICAL PATHOLOGY

## 2021-07-01 LAB — MAGNESIUM: Magnesium: 2.1 mg/dL (ref 1.7–2.4)

## 2021-07-01 SURGERY — AMPUTATION, FOOT, RAY
Anesthesia: General | Laterality: Left

## 2021-07-01 MED ORDER — ONDANSETRON HCL 4 MG/2ML IJ SOLN: 4.0000 mg | Freq: Once | INTRAMUSCULAR | Status: DC | PRN

## 2021-07-01 MED ORDER — PROPOFOL 500 MG/50ML IV EMUL
INTRAVENOUS | Status: DC | PRN
  Administered 2021-07-01: 125 ug/kg/min via INTRAVENOUS

## 2021-07-01 MED ORDER — PROPOFOL 10 MG/ML IV BOLUS
INTRAVENOUS | Status: AC
  Filled 2021-07-01: qty 20

## 2021-07-01 MED ORDER — LINEZOLID 600 MG/300ML IV SOLN
600.0000 mg | INTRAVENOUS | Status: AC
  Administered 2021-07-01: 600 mg via INTRAVENOUS
  Filled 2021-07-01: qty 300

## 2021-07-01 MED ORDER — BUPIVACAINE HCL (PF) 0.5 % IJ SOLN
INTRAMUSCULAR | Status: DC | PRN
  Administered 2021-07-01: 10 mL

## 2021-07-01 MED ORDER — FENTANYL CITRATE (PF) 100 MCG/2ML IJ SOLN: 25.0000 ug | INTRAMUSCULAR | Status: DC | PRN

## 2021-07-01 MED ORDER — OXYCODONE HCL 5 MG PO TABS: 5.0000 mg | ORAL_TABLET | Freq: Once | ORAL | Status: DC | PRN

## 2021-07-01 MED ORDER — FENTANYL CITRATE (PF) 100 MCG/2ML IJ SOLN
INTRAMUSCULAR | Status: AC
  Filled 2021-07-01: qty 2

## 2021-07-01 MED ORDER — PROPOFOL 10 MG/ML IV BOLUS
INTRAVENOUS | Status: DC | PRN
  Administered 2021-07-01: 20 mg via INTRAVENOUS
  Administered 2021-07-01: 30 mg via INTRAVENOUS

## 2021-07-01 MED ORDER — LIDOCAINE HCL (PF) 1 % IJ SOLN
INTRAMUSCULAR | Status: AC
  Filled 2021-07-01: qty 30

## 2021-07-01 MED ORDER — ACETAMINOPHEN 10 MG/ML IV SOLN: 1000.0000 mg | Freq: Once | INTRAVENOUS | Status: DC | PRN

## 2021-07-01 MED ORDER — LACTATED RINGERS IV SOLN: INTRAVENOUS | Status: DC

## 2021-07-01 MED ORDER — LIDOCAINE HCL 1 % IJ SOLN
INTRAMUSCULAR | Status: DC | PRN
  Administered 2021-07-01: 10 mL

## 2021-07-01 MED ORDER — LINEZOLID 600 MG PO TABS
600.0000 mg | ORAL_TABLET | Freq: Two times a day (BID) | ORAL | Status: DC
Start: 2021-07-02 — End: 2021-07-03
  Administered 2021-07-02 – 2021-07-03 (×3): 600 mg via ORAL
  Filled 2021-07-01 (×4): qty 1

## 2021-07-01 MED ORDER — EPHEDRINE 5 MG/ML INJ
INTRAVENOUS | Status: AC
  Filled 2021-07-01: qty 10

## 2021-07-01 MED ORDER — BUPIVACAINE HCL (PF) 0.5 % IJ SOLN
INTRAMUSCULAR | Status: AC
  Filled 2021-07-01: qty 30

## 2021-07-01 MED ORDER — OXYCODONE HCL 5 MG/5ML PO SOLN: 5.0000 mg | Freq: Once | ORAL | Status: DC | PRN

## 2021-07-01 MED ORDER — FENTANYL CITRATE (PF) 100 MCG/2ML IJ SOLN
INTRAMUSCULAR | Status: DC | PRN
  Administered 2021-07-01 (×2): 25 ug via INTRAVENOUS
  Administered 2021-07-01: 50 ug via INTRAVENOUS

## 2021-07-01 MED ORDER — SODIUM CHLORIDE 0.9 % IV SOLN: INTRAVENOUS | Status: DC | PRN

## 2021-07-01 SURGICAL SUPPLY — 45 items
BLADE MED AGGRESSIVE (BLADE) ×1 IMPLANT
BLADE SURG 10 STRL SS SAFETY (BLADE) IMPLANT
BNDG COHESIVE 4X5 TAN ST LF (GAUZE/BANDAGES/DRESSINGS) ×2 IMPLANT
BNDG ELASTIC 4X5.8 VLCR NS LF (GAUZE/BANDAGES/DRESSINGS) ×2 IMPLANT
BNDG ESMARK 4X12 TAN STRL LF (GAUZE/BANDAGES/DRESSINGS) ×1 IMPLANT
BNDG GAUZE ELAST 4 BULKY (GAUZE/BANDAGES/DRESSINGS) ×2 IMPLANT
BNDG STRETCH 4X75 STRL LF (GAUZE/BANDAGES/DRESSINGS) ×2 IMPLANT
CUFF TOURN SGL QUICK 18X4 (TOURNIQUET CUFF) ×1 IMPLANT
CUFF TOURN SGL QUICK 24 (TOURNIQUET CUFF)
CUFF TRNQT CYL 24X4X16.5-23 (TOURNIQUET CUFF) ×1 IMPLANT
DRAIN PENROSE 12X.25 LTX STRL (MISCELLANEOUS) IMPLANT
DRAPE FLUOR MINI C-ARM 54X84 (DRAPES) ×1 IMPLANT
DRSG EMULSION OIL 3X8 NADH (GAUZE/BANDAGES/DRESSINGS) ×1 IMPLANT
DURAPREP 26ML APPLICATOR (WOUND CARE) ×2 IMPLANT
ELECT REM PT RETURN 9FT ADLT (ELECTROSURGICAL) ×2
ELECTRODE REM PT RTRN 9FT ADLT (ELECTROSURGICAL) ×1 IMPLANT
GAUZE 4X4 16PLY ~~LOC~~+RFID DBL (SPONGE) ×2 IMPLANT
GAUZE PACKING IODOFORM 1/2 (PACKING) ×1 IMPLANT
GAUZE SPONGE 4X4 12PLY STRL (GAUZE/BANDAGES/DRESSINGS) ×3 IMPLANT
GLOVE SRG 8 PF TXTR STRL LF DI (GLOVE) ×1 IMPLANT
GLOVE SURG ENC TEXT LTX SZ8 (GLOVE) ×2 IMPLANT
GLOVE SURG UNDER POLY LF SZ8 (GLOVE) ×1
GOWN STRL REUS W/ TWL XL LVL3 (GOWN DISPOSABLE) ×1 IMPLANT
GOWN STRL REUS W/TWL XL LVL3 (GOWN DISPOSABLE) ×1
HANDLE YANKAUER SUCT BULB TIP (MISCELLANEOUS) IMPLANT
KIT TURNOVER KIT A (KITS) ×2 IMPLANT
LABEL OR SOLS (LABEL) IMPLANT
MANIFOLD NEPTUNE II (INSTRUMENTS) ×2 IMPLANT
NDL FILTER BLUNT 18X1 1/2 (NEEDLE) ×1 IMPLANT
NDL SAFETY ECLIPSE 18X1.5 (NEEDLE) ×1 IMPLANT
NEEDLE FILTER BLUNT 18X 1/2SAF (NEEDLE) ×1
NEEDLE FILTER BLUNT 18X1 1/2 (NEEDLE) ×1 IMPLANT
NEEDLE HYPO 18GX1.5 SHARP (NEEDLE) ×1
NS IRRIG 500ML POUR BTL (IV SOLUTION) ×2 IMPLANT
PACK EXTREMITY ARMC (MISCELLANEOUS) ×2 IMPLANT
PAD ABD DERMACEA PRESS 5X9 (GAUZE/BANDAGES/DRESSINGS) ×3 IMPLANT
SOL PREP PVP 2OZ (MISCELLANEOUS)
SOLUTION PREP PVP 2OZ (MISCELLANEOUS) ×1 IMPLANT
SPONGE T-LAP 18X18 ~~LOC~~+RFID (SPONGE) IMPLANT
STAPLER SKIN PROX 35W (STAPLE) ×1 IMPLANT
STOCKINETTE M/LG 89821 (MISCELLANEOUS) ×2 IMPLANT
SUT PROLENE 3 0 PS 2 (SUTURE) IMPLANT
SWAB CULTURE AMIES ANAERIB BLU (MISCELLANEOUS) IMPLANT
SYR 10ML LL (SYRINGE) ×4 IMPLANT
WATER STERILE IRR 500ML POUR (IV SOLUTION) ×2 IMPLANT

## 2021-07-01 NOTE — Anesthesia Preprocedure Evaluation (Signed)
Anesthesia Evaluation  Patient identified by MRN, date of birth, ID band Patient awake    Reviewed: Allergy & Precautions, H&P , NPO status , Patient's Chart, lab work & pertinent test results  History of Anesthesia Complications Negative for: history of anesthetic complications  Airway Mallampati: II  TM Distance: >3 FB Neck ROM: full    Dental  (+) Missing, Dental Advidsory Given   Pulmonary neg shortness of breath, neg recent URI, Current Smoker and Patient abstained from smoking.,    Pulmonary exam normal        Cardiovascular Exercise Tolerance: Good (-) angina+ Peripheral Vascular Disease  (-) Past MI and (-) DOE Normal cardiovascular exam     Neuro/Psych negative neurological ROS  negative psych ROS   GI/Hepatic negative GI ROS, Neg liver ROS,   Endo/Other  diabetes, Type 2, Insulin Dependent  Renal/GU negative Renal ROS  negative genitourinary   Musculoskeletal   Abdominal   Peds  Hematology negative hematology ROS (+)   Anesthesia Other Findings Past Medical History: No date: Anemia of chronic disease No date: Diabetes mellitus without complication (HCC) No date: Tobacco use disorder No date: Toe gangrene [facility])  Past Surgical History: 09/22/2016: ACHILLES TENDON SURGERY; Right     Comment:  Procedure: ACHILLES TENDON LENGTHENING;  Surgeon: Gwyneth Revels, DPM;  Location: [facility] OR[facility];  Service: Podiatry;                Laterality: Right; 07/12/2015: AMPUTATION TOE; Left     Comment:  Procedure: AMPUTATION TOE;  Surgeon: Gwyneth Revels, DPM;              Location: [facility];  Service: Podiatry;  Laterality:               Left; 09/22/2016: AMPUTATION TOE; Right     Comment:  Procedure: AMPUTATION TOE;  Surgeon: Gwyneth Revels, DPM;              Location: [facility];  Service: Podiatry;  Laterality:               Right; 07/12/2015: IRRIGATION AND DEBRIDEMENT FOOT; Left     Comment:  Procedure:  IRRIGATION AND DEBRIDEMENT FOOT;  Surgeon:               Gwyneth Revels, DPM;  Location: [facility];  Service:               Podiatry;  Laterality: Left; 09/06/2015: IRRIGATION AND DEBRIDEMENT FOOT; Left     Comment:  Procedure: IRRIGATION AND DEBRIDEMENT FOOT;  Surgeon:               Recardo Evangelist, DPM;  Location: [facility];  Service:               Podiatry;  Laterality: Left;  BMI    Body Mass Index: 28.61 kg/m      Reproductive/Obstetrics negative OB ROS                             Anesthesia Physical  Anesthesia Plan  ASA: 3  Anesthesia Plan: General   Post-op Pain Management:    Induction: Intravenous  PONV Risk Score and Plan: Propofol infusion and TIVA  Airway Management Planned: Natural Airway and Simple Face Mask  Additional Equipment:   Intra-op Plan:   Post-operative Plan:   Informed Consent: I have reviewed the patients History  and Physical, chart, labs and discussed the procedure including the risks, benefits and alternatives for the proposed anesthesia with the patient or authorized representative who has indicated his/her understanding and acceptance.     Dental Advisory Given  Plan Discussed with: Anesthesiologist, CRNA and Surgeon  Anesthesia Plan Comments: (Patient consented for risks of anesthesia including but not limited to:  - adverse reactions to medications - risk of airway placement if required - damage to eyes, teeth, lips or other oral mucosa - nerve damage due to positioning  - sore throat or hoarseness - Damage to heart, brain, nerves, lungs, other parts of body or loss of life  Patient voiced understanding.)        Anesthesia Quick Evaluation

## 2021-07-01 NOTE — Interval H&P Note (Signed)
History and Physical Interval Note:  07/01/2021 2:43 PM  [name]  has presented today for surgery, with the diagnosis of Osteomyelitis Type 2 Diabetes.  The various methods of treatment have been discussed with the patient and family. After consideration of risks, benefits and other options for treatment, the patient has consented to  Procedure(s): AMPUTATION RAY (Left) BONE BIOPSY (Left) as a surgical intervention.  The patient's history has been reviewed, patient examined, no change in status, stable for surgery.  I have reviewed the patient's chart and labs.  Questions were answered to the patient's satisfaction.     [name] [name]

## 2021-07-01 NOTE — Progress Notes (Signed)
Date of Admission:  06/29/2021    Pt in OR  Objective: Vital signs in last 24 hours: Temp:  [98.3 F (36.8 C)-98.9 F (37.2 C)] 98.3 F (36.8 C) (11/04 1126) Pulse Rate:  [81-98] 81 (11/04 1126) Resp:  [15-18] 15 (11/04 1126) BP: (123-147)/(72-96) 147/91 (11/04 1126) SpO2:  [97 %-100 %] 99 % (11/04 1126)  Lab Results Recent Labs    06/30/21 0509 07/01/21 0544  WBC 7.2 4.0  HGB 11.5* 12.0*  HCT 33.7* 34.7*  NA 135 136  K 3.1* 4.0  CL 99 105  CO2 25 24  BUN 5* 8  CREATININE 0.68 0.80   Liver Panel Recent Labs    06/29/21 1353  PROT 7.7  ALBUMIN 3.7  AST 17  ALT 9  ALKPHOS 57  BILITOT 1.1   Sedimentation Rate Recent Labs    06/29/21 1830  ESRSEDRATE 79*   C-Reactive Protein No results for input(s): CRP in the last 72 hours.  Microbiology:  Studies/Results: MR FOOT LEFT W WO CONTRAST  Result Date: 06/30/2021 CLINICAL DATA:  Osteomyelitis. Prior amputation. Gas in the soft tissues on radiography. EXAM: MRI OF THE LEFT FOREFOOT WITHOUT AND WITH CONTRAST TECHNIQUE: Multiplanar, multisequence MR imaging of the left forefoot was performed both before and after administration of intravenous contrast. CONTRAST:  99mL GADAVIST GADOBUTROL 1 MMOL/ML IV SOLN COMPARISON:  Radiographs 06/29/2021 and MRI 01/12/2021 FINDINGS: Bones/Joint/Cartilage Prior amputation of the first ray at the proximal metatarsal. Chronic callus expansion/deformities in the distal second and third metatarsals with fusion of the second MTP joint. 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 There is diffuse abnormal edema in the remaining first metatarsal compatible with active osteomyelitis. Ligaments The Lisfranc ligament is ruptured. Muscles and Tendons Severe muscular atrophy. Soft tissues There is a large medial ulceration in the medial foot shown on image 9 of series 8, extending to a 2.4 by 2.0 cm debris-filled cavity that abuts the distal  margin of the remaining first digit metatarsal. Surrounding scattered gas in the soft tissues compatible with infection. Extensive surrounding cellulitis. IMPRESSION: 1. Active osteomyelitis of the remaining first metatarsal. There is a large adjacent medial ulceration extending to a debris-filled cavity abutting the distal amputation margin of the first metatarsal, along with scattered loculations of gas in the surrounding soft tissues indicating infection, and extensive surrounding cellulitis. 2. 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 Electronically Signed   By: Van Clines M.D.   On: 06/30/2021 07:40   US Venous Img Lower Bilateral (DVT)  Result Date: 06/29/2021 CLINICAL DATA:  Bilateral lower extremity swelling. EXAM: BILATERAL LOWER EXTREMITY VENOUS DOPPLER ULTRASOUND TECHNIQUE: Gray-scale sonography with graded compression, as well as color Doppler and duplex ultrasound were performed to evaluate the lower extremity deep venous systems from the level of the common femoral vein and including the common femoral, femoral, profunda femoral, popliteal and calf veins including the posterior tibial, peroneal and gastrocnemius veins when visible. The superficial great saphenous vein was also interrogated. Spectral Doppler was utilized to evaluate flow at rest and with distal augmentation maneuvers in the common femoral, femoral and popliteal veins. COMPARISON:  None. FINDINGS: RIGHT LOWER EXTREMITY Common Femoral Vein: No evidence of thrombus. Normal compressibility, respiratory phasicity and response to augmentation. Saphenofemoral Junction: No evidence of thrombus. Normal compressibility and flow on color Doppler imaging. Profunda Femoral Vein: No evidence of thrombus. Normal compressibility and flow on color Doppler imaging. Femoral Vein: No evidence of thrombus. Normal compressibility, respiratory phasicity  and response to augmentation. Popliteal Vein: No evidence  of thrombus. Normal compressibility, respiratory phasicity and response to augmentation. Calf Veins: No evidence of thrombus. Normal compressibility and flow on color Doppler imaging. Superficial Great Saphenous Vein: No evidence of thrombus. Normal compressibility. Venous Reflux:  None. Other Findings:  None. LEFT LOWER EXTREMITY Common Femoral Vein: No evidence of thrombus. Normal compressibility, respiratory phasicity and response to augmentation. Saphenofemoral Junction: No evidence of thrombus. Normal compressibility and flow on color Doppler imaging. Profunda Femoral Vein: No evidence of thrombus. Normal compressibility and flow on color Doppler imaging. Femoral Vein: No evidence of thrombus. Normal compressibility, respiratory phasicity and response to augmentation. Popliteal Vein: No evidence of thrombus. Normal compressibility, respiratory phasicity and response to augmentation. Calf Veins: No evidence of thrombus. Normal compressibility and flow on color Doppler imaging. Superficial Great Saphenous Vein: No evidence of thrombus. Normal compressibility. Venous Reflux:  None. Other Findings:  None. IMPRESSION: No evidence of deep venous thrombosis in either lower extremity. Electronically Signed   By: Virgina Norfolk M.D.   On: 06/29/2021 20:15   DG Foot 2 Views Left  Result Date: 06/29/2021 CLINICAL DATA:  Diabetic ulcer. EXAM: LEFT FOOT - 2 VIEW COMPARISON:  June 29, 2021 (1:55 p.m.) FINDINGS: Transmetatarsal amputation of the left great toe is again seen. Since the prior study there is been interval removal of a cortical fragment noted adjacent to the remainder of the first left metatarsal. A 16 mm x 12 mm area of soft tissue air is now seen within this region. Stable chronic changes are noted along the distal aspects of the second and third left metatarsals. Moderate severity diffuse soft tissue swelling is seen. IMPRESSION: 1. Interval removal of a cortical fragment seen adjacent to the  remaining portion of the first left metatarsal on the prior study. 2. Small area of residual soft tissue air is seen within this region. 3. Diffuse soft tissue swelling Electronically Signed   By: Virgina Norfolk M.D.   On: 06/29/2021 19:52   DG Foot Complete Left  Result Date: 06/29/2021 CLINICAL DATA:  Assess for osteomyelitis. Status post first ray amputation. EXAM: LEFT FOOT - COMPLETE 3+ VIEW COMPARISON:  01/14/21 FINDINGS: There is diffuse soft tissue swelling. Signs of previous first ray amputation at the level of the mid shaft of the metatarsal bone. Compared with the previous exam there has been progressive fragmentation and demineralization involving the distal aspect of the remaining portions of the first metatarsal bone. Soft tissue ulcer is noted within the overlying soft tissues and there is gas within the subcutaneous soft tissues adjacent to the bone. Ankylosis of the second MTP joint is noted. Heel fracture deformities involving the distal shaft of the third metatarsal bone and fifth proximal phalanx noted. IMPRESSION: 1. Progressive fragmentation and demineralization involving the distal aspect of the first metatarsal bone at the amputation site compatible with osteomyelitis. 2. Soft tissue ulcer overlying the first metatarsal bone is noted with. gas in the underlying subcutaneous soft tissues adjacent to the bone compatible with soft tissue infection. 3. Ankylosis of the second MTP joint. Electronically Signed   By: Kerby Moors M.D.   On: 06/29/2021 14:18   DG Foot Complete Right  Result Date: 06/29/2021 CLINICAL DATA:  Diabetic ulcer. EXAM: RIGHT FOOT COMPLETE - 3+ VIEW COMPARISON:  Jan 14, 2021 FINDINGS: Transmetatarsal amputation of the second right toe is seen with postoperative changes noted along the distal aspect of the third right metatarsal and proximal third right foot. The radiopaque surgical  material seen in between the third right metatarsal and remainder of the third  right toe on the prior study are no longer present. There is no evidence of an acute fracture or dislocation. Mild degenerative changes are seen along the dorsal aspect of the mid right foot. A large plantar calcaneal spur is present. An area of soft tissue ulceration is seen along the distal tip of the right great toe. There is no definite evidence of associated cortical destruction. IMPRESSION: Soft ulceration along the distal tip of the right great toe without definite evidence of osteomyelitis. MRI correlation is recommended if this remains of clinical concern. Electronically Signed   By: Aram Candela M.D.   On: 06/29/2021 19:44     Assessment/Plan: ? Diabetic foot infection- left foot at the site of previous first  ray excision site   Abscess and surrounding cellulitis with underlying osteomyelitis  of the remaining first metatarsal S/p bedside I/D and packing Cultures sent and pending Currently on linezolid and zosyn Pt has gone for surgery   DM- - Hba1c 5.3 on 06/30/21   Anemia   ID will follow peripherally this weekend

## 2021-07-01 NOTE — Progress Notes (Signed)
PROGRESS NOTE    [name] [name]  ZOX:[id number] DOB: [birth year] DOA: 06/29/2021 PCP: Pcp, No   Assessment & Plan:   Active Problems:   Diabetic infection of left foot (HCC)   Sepsis (HCC)   Sepsis: present on admission. Meets criteria w/ tachycardia, leukocytosis and & left foot osteomyelitics. Blood cx NGTD. Continue on IV zosyn, linezolid as per ID. Procal is < 0.10. 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 Continue on IV zosyn, linezolid as per ID. B/l LE [place] was neg for DVTs. MRI left foot shows active osteomyelitis of the remaining first metatarsal, large medial ulceration w/ scattered loculations of gas in the surrounding soft tissues. Wound cx growing gram positive cocci, sens pending. Going for left foot partial first ray amputation w/ bone biopsy today as per podiatry.  Hx of previous toe amputations.    Leukocytosis: resolved    Lactic acidosis: resolved    DM2: well controlled, HbA1c 5.3. Continue on SSI w/ accuchecks    Hypokalemia: WNL today    Cigar smoker: smoking cessation counseling    DVT prophylaxis: lovenox  Code Status: full  Family Communication:  Disposition Plan: unclear   Level of care: Med-Surg  Status is: Inpatient  Remains inpatient appropriate because: severity of illness, going for left foot surg today      Consultants:  Podiatry  ID  Procedures:   Antimicrobials: zosyn, linezolid    Subjective: Pt c/o malaise    Objective: Vitals:   06/30/21 1507 06/30/21 1952 06/30/21 2326 07/01/21 0340  BP: 129/79 134/75 123/72 (!) 141/90  Pulse: 97 98 90 92  Resp: Temp: 98.6 F (37 C) 98.5 F (36.9 C) 98.5 F (36.9 C) 98.9 F (37.2 C)  TempSrc:  Oral Oral Oral  SpO2: 100% 100% 98% 100%  Weight:      Height:        Intake/Output Summary (Last 24 hours) at 07/01/2021 0727 Last data filed at 07/01/2021 8119 Gross per 24 hour  Intake 989.25 ml  Output 850 ml  Net 139.25 ml   Filed Weights    06/29/21 1324  Weight: 108 kg    Examination:  General exam: Appears comfortable  Respiratory system: clear breath sounds b/l  Cardiovascular system: S1/S2+. No gallops or rubs   Gastrointestinal system: Abd is soft, NT, ND & normal bowel sounds  Central nervous system: alert and oriented. Moves all extremities  Psychiatry: Judgement and insight appear normal. Flat mood and affect    Data Reviewed: I have personally reviewed following labs and imaging studies  CBC: Recent Labs  Lab 06/29/21 1353 06/30/21 0509 07/01/21 0544  WBC 11.5* 7.2 4.0  NEUTROABS 8.7*  --   --   HGB 14.6 11.5* 12.0*  HCT 42.4 33.7* 34.7*  MCV 104.4* 104.3* 105.5*  PLT 329 302 327   Basic Metabolic Panel: Recent Labs  Lab 06/29/21 1353 06/30/21 0509 07/01/21 0544  NA 134* 135 136  K 3.4* 3.1* 4.0  CL 95* 99 105  CO2 GLUCOSE 119* 91 106*  BUN 6* 5* 8  CREATININE 0.71 0.68 0.80  CALCIUM 9.8 8.7* 8.8*  MG  --   --  2.1   GFR: Estimated Creatinine Clearance: 129.1 mL/min (by C-G formula based on SCr of 0.8 mg/dL). Liver Function Tests: Recent Labs  Lab 06/29/21 1353  AST 17  ALT 9  ALKPHOS 57  BILITOT 1.1  PROT 7.7  ALBUMIN  3.7   No results for input(s): LIPASE, AMYLASE in the last 168 hours. No results for input(s): AMMONIA in the last 168 hours. Coagulation Profile: No results for input(s): INR, PROTIME in the last 168 hours. Cardiac Enzymes: No results for input(s): CKTOTAL, CKMB, CKMBINDEX, TROPONINI in the last 168 hours. BNP (last 3 results) No results for input(s): PROBNP in the last 8760 hours. HbA1C: Recent Labs    06/30/21 0509  HGBA1C 5.3   CBG: Recent Labs  Lab 06/30/21 0726 06/30/21 0804 06/30/21 1154 06/30/21 1821 06/30/21 2100  GLUCAP 87 93 107* 135* 139*   Lipid Profile: No results for input(s): CHOL, HDL, LDLCALC, TRIG, CHOLHDL, LDLDIRECT in the last 72 hours. Thyroid Function Tests: No results for input(s): TSH, T4TOTAL, FREET4, T3FREE,  THYROIDAB in the last 72 hours. Anemia Panel: No results for input(s): VITAMINB12, FOLATE, FERRITIN, TIBC, IRON, RETICCTPCT in the last 72 hours. Sepsis Labs: Recent Labs  Lab 06/29/21 1353 06/29/21 1830 06/30/21 0509  PROCALCITON  --  <0.10 <0.10  LATICACIDVEN 2.5* 1.6  --     Recent Results (from the past 240 hour(s))  Resp Panel by RT-PCR (Flu A&B, Covid) Nasopharyngeal Swab     Status: None   Collection Time: 06/29/21  5:20 PM   Specimen: Nasopharyngeal Swab; Nasopharyngeal(NP) swabs in vial transport medium  Result Value Ref Range Status   SARS Coronavirus 2 by RT PCR NEGATIVE NEGATIVE Final    Comment: (NOTE) SARS-CoV-2 target nucleic acids are NOT DETECTED.  The SARS-CoV-2 RNA is generally detectable in upper respiratory specimens during the acute phase of infection. The lowest concentration of SARS-CoV-2 viral copies this assay can detect is 138 copies/mL. A negative result does not preclude SARS-Cov-2 infection and should not be used as the sole basis for treatment or other patient management decisions. A negative result may occur with  improper specimen collection/handling, submission of specimen other than nasopharyngeal swab, presence of viral mutation(s) within the areas targeted by this assay, and inadequate number of viral copies(<138 copies/mL). A negative result must be combined with clinical observations, patient history, and epidemiological information. The expected result is Negative.  Fact Sheet for Patients:  BloggerCourse.com  Fact Sheet for Healthcare Providers:  SeriousBroker.it  This test is no t yet approved or cleared by the [place] FDA and  has been authorized for detection and/or diagnosis of SARS-CoV-2 by FDA under an Emergency Use Authorization (EUA). This EUA will remain  in effect (meaning this test can be used) for the duration of the COVID-19 declaration under Section 564(b)(1) of  the Act, 21 U.S.C.section 360bbb-3(b)(1), unless the authorization is terminated  or revoked sooner.       Influenza A by PCR NEGATIVE NEGATIVE Final   Influenza B by PCR NEGATIVE NEGATIVE Final    Comment: (NOTE) The Xpert Xpress SARS-CoV-2/FLU/RSV plus assay is intended as an aid in the diagnosis of influenza from Nasopharyngeal swab specimens and should not be used as a sole basis for treatment. Nasal washings and aspirates are unacceptable for Xpert Xpress SARS-CoV-2/FLU/RSV testing.  Fact Sheet for Patients: BloggerCourse.com  Fact Sheet for Healthcare Providers: SeriousBroker.it  This test is not yet approved or cleared by the [place] FDA and has been authorized for detection and/or diagnosis of SARS-CoV-2 by FDA under an Emergency Use Authorization (EUA). This EUA will remain in effect (meaning this test can be used) for the duration of the COVID-19 declaration under Section 564(b)(1) of the Act, 21 U.S.C. section 360bbb-3(b)(1), unless the authorization is terminated  or revoked.  Performed at [facility], [address] [place], [place] [address]   CULTURE, BLOOD (ROUTINE X 2) w Reflex to ID Panel     Status: None (Preliminary result)   Collection Time: 06/29/21  6:36 PM   Specimen: BLOOD  Result Value Ref Range Status   Specimen Description BLOOD LEFT ANTECUBITAL  Final   Special Requests   Final    BOTTLES DRAWN AEROBIC AND ANAEROBIC Blood Culture adequate volume   Culture   Final    NO GROWTH 2 DAYS Performed at [facility], [address] [place], [place] [address]    Report Status PENDING  Incomplete  CULTURE, BLOOD (ROUTINE X 2) w Reflex to ID Panel     Status: None (Preliminary result)   Collection Time: 06/29/21  6:36 PM   Specimen: BLOOD  Result Value Ref Range Status   Specimen Description BLOOD BLOOD LEFT HAND  Final   Special Requests   Final    BOTTLES DRAWN AEROBIC  ONLY Blood Culture results may not be optimal due to an inadequate volume of blood received in culture bottles   Culture   Final    NO GROWTH 2 DAYS Performed at [facility], [address] [place], [place] [address]    Report Status PENDING  Incomplete  Aerobic/Anaerobic Culture w Gram Stain (surgical/deep wound)     Status: None (Preliminary result)   Collection Time: 06/29/21  7:50 PM   Specimen: Wound  Result Value Ref Range Status   Specimen Description   Final    WOUND Performed at [facility], [address] [place], [place] [address]    Special Requests   Final    LEFT FOOT Performed at [facility], [address] [place], [place] [address]    Gram Stain   Final    NO SQUAMOUS EPITHELIAL CELLS SEEN NO WBC SEEN FEW GRAM POSITIVE COCCI Performed at [facility], [address], [place], [place] [address]    Culture PENDING  Incomplete   Report Status PENDING  Incomplete         Radiology Studies: MR FOOT LEFT W WO CONTRAST  Result Date: 06/30/2021 CLINICAL DATA:  Osteomyelitis. Prior amputation. Gas in the soft tissues on radiography. EXAM: MRI OF THE LEFT FOREFOOT WITHOUT AND WITH CONTRAST TECHNIQUE: Multiplanar, multisequence MR imaging of the left forefoot was performed both before and after administration of intravenous contrast. CONTRAST:  40mL GADAVIST GADOBUTROL 1 MMOL/ML IV SOLN COMPARISON:  Radiographs 06/29/2021 and MRI 01/12/2021 FINDINGS: Bones/Joint/Cartilage Prior amputation of the first ray at the proximal metatarsal. Chronic callus expansion/deformities in the distal second and third metatarsals with fusion of the second MTP joint. 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 There is diffuse abnormal edema in the remaining first metatarsal compatible with active osteomyelitis. Ligaments The Lisfranc ligament is ruptured. Muscles and Tendons Severe muscular atrophy.  Soft tissues There is a large medial ulceration in the medial foot shown on image 9 of series 8, extending to a 2.4 by 2.0 cm debris-filled cavity that abuts the distal margin of the remaining first digit metatarsal. Surrounding scattered gas in the soft tissues compatible with infection. Extensive surrounding cellulitis. IMPRESSION: 1. Active osteomyelitis of the remaining first metatarsal. There is a large adjacent medial ulceration extending to a debris-filled cavity abutting the distal amputation margin of the first metatarsal, along with scattered loculations of gas in the surrounding soft tissues indicating infection, and  extensive surrounding cellulitis. 2. 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 Electronically Signed   By: Gaylyn Rong M.D.   On: 06/30/2021 07:40   US Venous Img Lower Bilateral (DVT)  Result Date: 06/29/2021 CLINICAL DATA:  Bilateral lower extremity swelling. EXAM: BILATERAL LOWER EXTREMITY VENOUS DOPPLER ULTRASOUND TECHNIQUE: Gray-scale sonography with graded compression, as well as color Doppler and duplex ultrasound were performed to evaluate the lower extremity deep venous systems from the level of the common femoral vein and including the common femoral, femoral, profunda femoral, popliteal and calf veins including the posterior tibial, peroneal and gastrocnemius veins when visible. The superficial great saphenous vein was also interrogated. Spectral Doppler was utilized to evaluate flow at rest and with distal augmentation maneuvers in the common femoral, femoral and popliteal veins. COMPARISON:  None. FINDINGS: RIGHT LOWER EXTREMITY Common Femoral Vein: No evidence of thrombus. Normal compressibility, respiratory phasicity and response to augmentation. Saphenofemoral Junction: No evidence of thrombus. Normal compressibility and flow on color Doppler imaging. Profunda Femoral Vein: No evidence of thrombus. Normal compressibility and flow  on color Doppler imaging. Femoral Vein: No evidence of thrombus. Normal compressibility, respiratory phasicity and response to augmentation. Popliteal Vein: No evidence of thrombus. Normal compressibility, respiratory phasicity and response to augmentation. Calf Veins: No evidence of thrombus. Normal compressibility and flow on color Doppler imaging. Superficial Great Saphenous Vein: No evidence of thrombus. Normal compressibility. Venous Reflux:  None. Other Findings:  None. LEFT LOWER EXTREMITY Common Femoral Vein: No evidence of thrombus. Normal compressibility, respiratory phasicity and response to augmentation. Saphenofemoral Junction: No evidence of thrombus. Normal compressibility and flow on color Doppler imaging. Profunda Femoral Vein: No evidence of thrombus. Normal compressibility and flow on color Doppler imaging. Femoral Vein: No evidence of thrombus. Normal compressibility, respiratory phasicity and response to augmentation. Popliteal Vein: No evidence of thrombus. Normal compressibility, respiratory phasicity and response to augmentation. Calf Veins: No evidence of thrombus. Normal compressibility and flow on color Doppler imaging. Superficial Great Saphenous Vein: No evidence of thrombus. Normal compressibility. Venous Reflux:  None. Other Findings:  None. IMPRESSION: No evidence of deep venous thrombosis in either lower extremity. Electronically Signed   By: Aram Candela M.D.   On: 06/29/2021 20:15   DG Foot 2 Views Left  Result Date: 06/29/2021 CLINICAL DATA:  Diabetic ulcer. EXAM: LEFT FOOT - 2 VIEW COMPARISON:  June 29, 2021 (1:55 p.m.) FINDINGS: Transmetatarsal amputation of the left great toe is again seen. Since the prior study there is been interval removal of a cortical fragment noted adjacent to the remainder of the first left metatarsal. A 16 mm x 12 mm area of soft tissue air is now seen within this region. Stable chronic changes are noted along the distal aspects of the  second and third left metatarsals. Moderate severity diffuse soft tissue swelling is seen. IMPRESSION: 1. Interval removal of a cortical fragment seen adjacent to the remaining portion of the first left metatarsal on the prior study. 2. Small area of residual soft tissue air is seen within this region. 3. Diffuse soft tissue swelling Electronically Signed   By: Aram Candela M.D.   On: 06/29/2021 19:52   DG Foot Complete Left  Result Date: 06/29/2021 CLINICAL DATA:  Assess for osteomyelitis. Status post first ray amputation. EXAM: LEFT FOOT - COMPLETE 3+ VIEW COMPARISON:  01/14/21 FINDINGS: There is diffuse soft tissue swelling. Signs of previous first ray amputation at the level of the mid shaft of the metatarsal bone. Compared with the previous  exam there has been progressive fragmentation and demineralization involving the distal aspect of the remaining portions of the first metatarsal bone. Soft tissue ulcer is noted within the overlying soft tissues and there is gas within the subcutaneous soft tissues adjacent to the bone. Ankylosis of the second MTP joint is noted. Heel fracture deformities involving the distal shaft of the third metatarsal bone and fifth proximal phalanx noted. IMPRESSION: 1. Progressive fragmentation and demineralization involving the distal aspect of the first metatarsal bone at the amputation site compatible with osteomyelitis. 2. Soft tissue ulcer overlying the first metatarsal bone is noted with. gas in the underlying subcutaneous soft tissues adjacent to the bone compatible with soft tissue infection. 3. Ankylosis of the second MTP joint. Electronically Signed   By: Signa Kell M.D.   On: 06/29/2021 14:18   DG Foot Complete Right  Result Date: 06/29/2021 CLINICAL DATA:  Diabetic ulcer. EXAM: RIGHT FOOT COMPLETE - 3+ VIEW COMPARISON:  Jan 14, 2021 FINDINGS: Transmetatarsal amputation of the second right toe is seen with postoperative changes noted along the distal aspect  of the third right metatarsal and proximal third right foot. The radiopaque surgical material seen in between the third right metatarsal and remainder of the third right toe on the prior study are no longer present. There is no evidence of an acute fracture or dislocation. Mild degenerative changes are seen along the dorsal aspect of the mid right foot. A large plantar calcaneal spur is present. An area of soft tissue ulceration is seen along the distal tip of the right great toe. There is no definite evidence of associated cortical destruction. IMPRESSION: Soft ulceration along the distal tip of the right great toe without definite evidence of osteomyelitis. MRI correlation is recommended if this remains of clinical concern. Electronically Signed   By: Aram Candela M.D.   On: 06/29/2021 19:44        Scheduled Meds:  docusate sodium  200 mg Oral BID   enoxaparin (LOVENOX) injection  40 mg Subcutaneous Q24H   insulin aspart  0-9 Units Subcutaneous TID WC   linezolid  600 mg Oral Q12H   sodium chloride flush  3 mL Intravenous Q12H   Continuous Infusions:  sodium chloride 75 mL/hr at 06/30/21 0834   lactated ringers     And   lactated ringers     piperacillin-tazobactam (ZOSYN)  IV 3.375 g (07/01/21 0512)     LOS: 2 days    Time spent: 20 mins     Charise Killian, MD [facility] Pager [phone]  If 7PM-7AM, please contact night-coverage 07/01/2021, 7:27 AM

## 2021-07-01 NOTE — Anesthesia Postprocedure Evaluation (Signed)
Anesthesia Post Note  Patient: [name] [name]  Procedure(s) Performed: AMPUTATION RAY (Left) BONE BIOPSY (Left)  Patient location during evaluation: PACU Anesthesia Type: General Level of consciousness: awake and alert Pain management: pain level controlled Vital Signs Assessment: post-procedure vital signs reviewed and stable Respiratory status: spontaneous breathing, nonlabored ventilation, respiratory function stable and patient connected to nasal cannula oxygen Cardiovascular status: blood pressure returned to baseline and stable Postop Assessment: no apparent nausea or vomiting Anesthetic complications: no   No notable events documented.   Last Vitals:  Vitals:   07/01/21 1810 07/01/21 1832  BP: (!) 160/82 (!) 154/70  Pulse: 79 81  Resp: 19 16  Temp:  36.5 C  SpO2: 100% 100%    Last Pain:  Vitals:   07/01/21 1808  TempSrc:   PainSc: 0-No pain                 Lenard Simmer

## 2021-07-01 NOTE — Anesthesia Preprocedure Evaluation (Deleted)
Anesthesia Evaluation    Airway        Dental   Pulmonary Current Smoker and Patient abstained from smoking.,           Cardiovascular + Peripheral Vascular Disease    ECG 01/11/21: normal   Neuro/Psych    GI/Hepatic   Endo/Other  diabetes, Type 2, Insulin Dependent  Renal/GU      Musculoskeletal   Abdominal   Peds  Hematology  (+) Blood dyscrasia, anemia ,   Anesthesia Other Findings Chronic osteomyelitis with sepsis this admission  Reproductive/Obstetrics                             Anesthesia Physical Anesthesia Plan  ASA: 3  Anesthesia Plan: General   Post-op Pain Management:    Induction: Intravenous  PONV Risk Score and Plan: 1 and Propofol infusion, TIVA and Treatment may vary due to age or medical condition  Airway Management Planned: Natural Airway  Additional Equipment:   Intra-op Plan:   Post-operative Plan:   Informed Consent:   Plan Discussed with:   Anesthesia Plan Comments:         Anesthesia Quick Evaluation

## 2021-07-01 NOTE — Brief Op Note (Signed)
07/01/2021  5:46 PM  PATIENT:  [name] [name]  60 y.o. male  PRE-OPERATIVE DIAGNOSIS:  Osteomyelitis Type 2 Diabetes  POST-OPERATIVE DIAGNOSIS:  Osteomyelitis Type 2 Diabetes  PROCEDURE:  Procedure(s): AMPUTATION RAY (Left) BONE BIOPSY (Left)  SURGEON:  Surgeon(s) and Role:    Felecia Shelling, DPM - Primary  PHYSICIAN ASSISTANT:   ASSISTANTS: none   ANESTHESIA:   none  EBL:  3 mL   BLOOD ADMINISTERED:none  DRAINS: none   LOCAL MEDICATIONS USED:  MARCAINE    and LIDOCAINE   SPECIMEN:  Source of Specimen:  1st metatarsal sent for specimen and culture in two separate containers  DISPOSITION OF SPECIMEN:  PATHOLOGY  COUNTS:  YES  TOURNIQUET:   Total Tourniquet Time Documented: Calf (Left) - 34 minutes Total: Calf (Left) - 34 minutes   DICTATION: .Reubin Milan Dictation  PLAN OF CARE: Admit to inpatient   PATIENT DISPOSITION:  PACU - hemodynamically stable.   Delay start of Pharmacological VTE agent (>24hrs) due to surgical blood loss or risk of bleeding: not applicable

## 2021-07-01 NOTE — Anesthesia Procedure Notes (Signed)
Procedure Name: MAC Date/Time: 07/01/2021 5:07 PM Performed by: Lily Peer, Sholonda Jobst, CRNA Pre-anesthesia Checklist: Patient identified, Emergency Drugs available, Suction available, Patient being monitored and Timeout performed Oxygen Delivery Method: Simple face mask Induction Type: IV induction

## 2021-07-01 NOTE — Transfer of Care (Signed)
Immediate Anesthesia Transfer of Care Note  Patient: [name] [name]  Procedure(s) Performed: AMPUTATION RAY (Left) BONE BIOPSY (Left)  Patient Location: PACU  Anesthesia Type:General  Level of Consciousness: awake, alert  and oriented  Airway & Oxygen Therapy: Patient Spontanous Breathing  Post-op Assessment: Report given to RN and Post -op Vital signs reviewed and stable  Post vital signs: Reviewed and stable  Last Vitals:  Vitals Value Taken Time  BP 136/79   Temp    Pulse 87 07/01/21 1747  Resp 20   SpO2 99 % 07/01/21 1747  Vitals shown include unvalidated device data.  Last Pain:  Vitals:   07/01/21 1535  TempSrc: Temporal  PainSc:          Complications: No notable events documented.

## 2021-07-02 ENCOUNTER — Encounter: Payer: Self-pay | Admitting: Podiatry

## 2021-07-02 DIAGNOSIS — D539 Nutritional anemia, unspecified: Secondary | ICD-10-CM

## 2021-07-02 DIAGNOSIS — E1169 Type 2 diabetes mellitus with other specified complication: Secondary | ICD-10-CM

## 2021-07-02 LAB — MAGNESIUM: Magnesium: 1.9 mg/dL (ref 1.7–2.4)

## 2021-07-02 LAB — BASIC METABOLIC PANEL
Anion gap: 8 (ref 5–15)
BUN: 7 mg/dL — ABNORMAL LOW (ref 8–23)
CO2: 25 mmol/L (ref 22–32)
Calcium: 8.7 mg/dL — ABNORMAL LOW (ref 8.9–10.3)
Chloride: 100 mmol/L (ref 98–111)
Creatinine, Ser: 0.62 mg/dL (ref 0.61–1.24)
GFR, Estimated: >60 mL/min (ref >60)
Glucose, Bld: 100 mg/dL — ABNORMAL HIGH (ref 70–99)
Potassium: 3.8 mmol/L (ref 3.5–5.1)
Sodium: 133 mmol/L — ABNORMAL LOW (ref 135–145)

## 2021-07-02 LAB — CBC
HCT: 34.2 % — ABNORMAL LOW (ref 39.0–52.0)
Hemoglobin: 11.6 g/dL — ABNORMAL LOW (ref 13.0–17.0)
MCH: 35 pg — ABNORMAL HIGH (ref 26.0–34.0)
MCHC: 33.9 g/dL (ref 30.0–36.0)
MCV: 103.3 fL — ABNORMAL HIGH (ref 80.0–100.0)
Platelets: 359 10*3/uL (ref 150–400)
RBC: 3.31 MIL/uL — ABNORMAL LOW (ref 4.22–5.81)
RDW: 12.5 % (ref 11.5–15.5)
WBC: 7.1 10*3/uL (ref 4.0–10.5)
nRBC: 0 % (ref 0.0–0.2)

## 2021-07-02 LAB — GLUCOSE, CAPILLARY
Glucose-Capillary: 102 mg/dL — ABNORMAL HIGH (ref 70–99)
Glucose-Capillary: 124 mg/dL — ABNORMAL HIGH (ref 70–99)
Glucose-Capillary: 113 mg/dL — ABNORMAL HIGH (ref 70–99)
Glucose-Capillary: 128 mg/dL — ABNORMAL HIGH (ref 70–99)

## 2021-07-02 MED ORDER — SODIUM CHLORIDE 0.9 % IV SOLN: INTRAVENOUS | Status: DC | PRN

## 2021-07-02 NOTE — Progress Notes (Signed)
   PODIATRY CONSULTATION  NAME [name] [name] MRN [id number] DOB 28-Mar-1961 DOA 06/29/2021   Reason for consult: Osteomyelitis  Assessment/plan: Patient status post POV #1 first ray resection with bone biopsy LT foot.  DOS: 07/01/2021.  Patient resting comfortably in bedside.  Minimal pain.  Dressings are intact with some strikethrough.  It appears that over the last 24 hours the dressings were reinforced by nurses due to bleeding.  1.  S/P first ray resection with bone biopsy LT foot -Dressings changed.  Keep clean dry and intact.  Packing was removed today -Cultures pending.  No growth < 12 hours -Surgical pathology DIAGNOSIS:  A.  BONE, LEFT FOOT; REMOVED FROM OPEN WOUND:  - BONE WITH ACUTE OSTEOMYELITIS.   GROSS DESCRIPTION:  A. Labeled: Labeled with the patient's name and date of birth (per  requisition loose bone from left foot.  Came out of wound)  Received: Fresh  Collection time: 10:10 PM on 06/29/2021  Placed into formalin time: 10:00 AM on 06/30/2021  Tissue fragment(s): 1  Size: 2.5 x 1.8 x 0.9 cm  Description: Tan-white, hemorrhagic bony tissue fragment with softened  cut surfaces  Representative sections are submitted in 1 cassette.   Tissue decalcification: Cassette 1  -Recommend outpatient long-term IV antibiotics as per infectious disease.  Their management and input is greatly appreciated -Minimal weightbearing as tolerated for transitional purposes only -Dressings reapplied.  Clean dry and intact until follow-up in the office. -From a surgical standpoint the patient is okay to be discharged.  Follow-up within 1 week in office.       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(HCC)     CBC Latest Ref Rng & Units 07/02/2021 07/01/2021 06/30/2021  WBC 4.0 - 10.5 K/uL 7.1 4.0 7.2  Hemoglobin 13.0 - 17.0 g/dL 11.6(L) 12.0(L) 11.5(L)  Hematocrit  39.0 - 52.0 % 34.2(L) 34.7(L) 33.7(L)  Platelets 150 - 400 K/uL 359 327 302    BMP Latest Ref Rng & Units 07/02/2021 07/01/2021 06/30/2021  Glucose 70 - 99 mg/dL 696(E) 952(W) 91  BUN 8 - 23 mg/dL 7(L) 8 5(L)  Creatinine 0.61 - 1.24 mg/dL 4.13 2.44 0.10  Sodium 135 - 145 mmol/L 133(L) 136 135  Potassium 3.5 - 5.1 mmol/L 3.8 4.0 3.1(L)  Chloride 98 - 111 mmol/L 100 105 99  CO2 22 - 32 mmol/L 25 24 25   Calcium 8.9 - 10.3 mg/dL ) 2.7(O) 5.3(G)   Please contact me directly with any questions or concerns.  Cell [phone]   [phone], DPM [facility]  Dr. Felecia Shelling, DPM    [address] [place], [place]                Office [phone]  Fax [phone]

## 2021-07-02 NOTE — Progress Notes (Addendum)
PROGRESS NOTE    [name] [name]  XFG:[id number] DOB: 1961/06/09 DOA: 06/29/2021 PCP: Pcp, No   Assessment & Plan:   Active Problems:   Diabetic infection of left foot (HCC)   Sepsis (HCC)   Sepsis: present on admission. Meets criteria w/ tachycardia, leukocytosis and & left foot osteomyelitics. Blood cx NGTD. Continue on IV zosyn, linezolid as per ID. Procal is < 0.10. 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 Continue on IV zosyn, linezolid as per ID. B/l LE [place] was neg for DVTs. MRI left foot shows active osteomyelitis of the remaining first metatarsal, large medial ulceration w/ scattered loculations of gas in the surrounding soft tissues. Wound cx growing gram positive cocci. 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 Continue on SSI w/ accuchecks     Hypokalemia: within normal limits    Cigar smoker: received smoking cessation counseling   Macrocytic anemia: will check B12 and folate levels. No need for a transfusion currently   DVT prophylaxis: lovenox  Code Status: full  Family Communication:  Disposition Plan: unclear   Level of care: Med-Surg  Status is: Inpatient  Remains inpatient appropriate because: severity of illness, wound cxs are still pending and still on IV abxs      Consultants:  Podiatry  ID  Procedures:   Antimicrobials: zosyn, linezolid    Subjective: Pt c/o left foot pain   Objective: Vitals:   07/01/21 1832 07/01/21 1938 07/02/21 0055 07/02/21 0348  BP: (!) 154/70 130/78 (!) 159/89 139/76  Pulse: 81 87 (!) 101 88  Resp: 16 18  17   Temp: 97.7 F (36.5 C) (!) 97.5 F (36.4 C)  98.6 F (37 C)  TempSrc:      SpO2: 100% 99% 100% 99%  Weight:      Height:        Intake/Output Summary (Last 24 hours) at 07/02/2021 0644 Last data filed at 07/02/2021 0454 Gross per 24 hour  Intake 340 ml   Output 1103 ml  Net -763 ml   Filed Weights   06/29/21 1324 07/01/21 1535  Weight: 108 kg 108 kg    Examination:  General exam: Appears calm & comfortable  Respiratory system: clear breath sounds b/l. No wheezes, rales  Cardiovascular system: S1 & S2+. No rubs or gallops   Gastrointestinal system: Abd is soft, NT, ND & normal bowel sounds  Central nervous system: Alert and oriented. Moves all extremities  Psychiatry: Judgement and insight appear normal. Flat mood and affect    Data Reviewed: I have personally reviewed following labs and imaging studies  CBC: Recent Labs  Lab 06/29/21 1353 06/30/21 0509 07/01/21 0544 07/02/21 0327  WBC 11.5* 7.2 4.0 7.1  NEUTROABS 8.7*  --   --   --   HGB 14.6 11.5* 12.0* 11.6*  HCT 42.4 33.7* 34.7* 34.2*  MCV 104.4* 104.3* 105.5* 103.3*  PLT 329 302 327 359   Basic Metabolic Panel: Recent Labs  Lab 06/29/21 1353 06/30/21 0509 07/01/21 0544 07/02/21 0327  NA 134* 135 136 133*  K 3.4* 3.1* 4.0 3.8  CL 95* 99 105 100  CO2 24 25 24 25   GLUCOSE 119* 91 106* 100*  BUN 6* 5* 8 7*  CREATININE 0.71 0.68 0.80 0.62  CALCIUM 9.8 8.7* 8.8* 8.7*  MG  --   --  2.1  1.9   GFR: Estimated Creatinine Clearance: 129.1 mL/min (by C-G formula based on SCr of 0.62 mg/dL). Liver Function Tests: Recent Labs  Lab 06/29/21 1353  AST 17  ALT 9  ALKPHOS 57  BILITOT 1.1  PROT 7.7  ALBUMIN 3.7   No results for input(s): LIPASE, AMYLASE in the last 168 hours. No results for input(s): AMMONIA in the last 168 hours. Coagulation Profile: No results for input(s): INR, PROTIME in the last 168 hours. Cardiac Enzymes: No results for input(s): CKTOTAL, CKMB, CKMBINDEX, TROPONINI in the last 168 hours. BNP (last 3 results) No results for input(s): PROBNP in the last 8760 hours. HbA1C: Recent Labs    06/30/21 0509  HGBA1C 5.3   CBG: Recent Labs  Lab 07/01/21 0855 07/01/21 1130 07/01/21 1637 07/01/21 1752 07/01/21 2010  GLUCAP 105* 103*  83 95 181*   Lipid Profile: No results for input(s): CHOL, HDL, LDLCALC, TRIG, CHOLHDL, LDLDIRECT in the last 72 hours. Thyroid Function Tests: No results for input(s): TSH, T4TOTAL, FREET4, T3FREE, THYROIDAB in the last 72 hours. Anemia Panel: No results for input(s): VITAMINB12, FOLATE, FERRITIN, TIBC, IRON, RETICCTPCT in the last 72 hours. Sepsis Labs: Recent Labs  Lab 06/29/21 1353 06/29/21 1830 06/30/21 0509  PROCALCITON  --  <0.10 <0.10  LATICACIDVEN 2.5* 1.6  --     Recent Results (from the past 240 hour(s))  Resp Panel by RT-PCR (Flu A&B, Covid) Nasopharyngeal Swab     Status: None   Collection Time: 06/29/21  5:20 PM   Specimen: Nasopharyngeal Swab; Nasopharyngeal(NP) swabs in vial transport medium  Result Value Ref Range Status   SARS Coronavirus 2 by RT PCR NEGATIVE NEGATIVE Final    Comment: (NOTE) SARS-CoV-2 target nucleic acids are NOT DETECTED.  The SARS-CoV-2 RNA is generally detectable in upper respiratory specimens during the acute phase of infection. The lowest concentration of SARS-CoV-2 viral copies this assay can detect is 138 copies/mL. A negative result does not preclude SARS-Cov-2 infection and should not be used as the sole basis for treatment or other patient management decisions. A negative result may occur with  improper specimen collection/handling, submission of specimen other than nasopharyngeal swab, presence of viral mutation(s) within the areas targeted by this assay, and inadequate number of viral copies(<138 copies/mL). A negative result must be combined with clinical observations, patient history, and epidemiological information. The expected result is Negative.  Fact Sheet for Patients:  BloggerCourse.com  Fact Sheet for Healthcare Providers:  SeriousBroker.it  This test is no t yet approved or cleared by the [place] FDA and  has been authorized for detection and/or diagnosis  of SARS-CoV-2 by FDA under an Emergency Use Authorization (EUA). This EUA will remain  in effect (meaning this test can be used) for the duration of the COVID-19 declaration under Section 564(b)(1) of the Act, 21 U.S.C.section 360bbb-3(b)(1), unless the authorization is terminated  or revoked sooner.       Influenza A by PCR NEGATIVE NEGATIVE Final   Influenza B by PCR NEGATIVE NEGATIVE Final    Comment: (NOTE) The Xpert Xpress SARS-CoV-2/FLU/RSV plus assay is intended as an aid in the diagnosis of influenza from Nasopharyngeal swab specimens and should not be used as a sole basis for treatment. Nasal washings and aspirates are unacceptable for Xpert Xpress SARS-CoV-2/FLU/RSV testing.  Fact Sheet for Patients: BloggerCourse.com  Fact Sheet for Healthcare Providers: SeriousBroker.it  This test is not yet approved or cleared by the [place] FDA and has been authorized for detection and/or diagnosis  of SARS-CoV-2 by FDA under an Emergency Use Authorization (EUA). This EUA will remain in effect (meaning this test can be used) for the duration of the COVID-19 declaration under Section 564(b)(1) of the Act, 21 U.S.C. section 360bbb-3(b)(1), unless the authorization is terminated or revoked.  Performed at [facility], [address] [place], [place] [address]   CULTURE, BLOOD (ROUTINE X 2) w Reflex to ID Panel     Status: None (Preliminary result)   Collection Time: 06/29/21  6:36 PM   Specimen: BLOOD  Result Value Ref Range Status   Specimen Description BLOOD LEFT ANTECUBITAL  Final   Special Requests   Final    BOTTLES DRAWN AEROBIC AND ANAEROBIC Blood Culture adequate volume   Culture   Final    NO GROWTH 3 DAYS Performed at [facility], [address] [place], [place] [address]    Report Status PENDING  Incomplete  CULTURE, BLOOD (ROUTINE X 2) w Reflex to ID Panel     Status: None (Preliminary  result)   Collection Time: 06/29/21  6:36 PM   Specimen: BLOOD  Result Value Ref Range Status   Specimen Description BLOOD BLOOD LEFT HAND  Final   Special Requests   Final    BOTTLES DRAWN AEROBIC ONLY Blood Culture results may not be optimal due to an inadequate volume of blood received in culture bottles   Culture   Final    NO GROWTH 3 DAYS Performed at [facility], [address], [place], [place] [address]    Report Status PENDING  Incomplete  Aerobic/Anaerobic Culture w Gram Stain (surgical/deep wound)     Status: None (Preliminary result)   Collection Time: 06/29/21  7:50 PM   Specimen: Wound  Result Value Ref Range Status   Specimen Description   Final    WOUND Performed at [facility], [address] [place], [place] [address]    Special Requests   Final    LEFT FOOT Performed at [facility], [address] [place], [place] [address]    Gram Stain   Final    NO SQUAMOUS EPITHELIAL CELLS SEEN NO WBC SEEN FEW GRAM POSITIVE COCCI    Culture   Final    ABUNDANT PROTEUS PENNERI CULTURE REINCUBATED FOR BETTER GROWTH Performed at [facility], [address] [place], [place] [address]    Report Status PENDING  Incomplete  Aerobic/Anaerobic Culture w Gram Stain (surgical/deep wound)     Status: None (Preliminary result)   Collection Time: 07/01/21  5:12 PM   Specimen: PATH Bone biopsy; Tissue  Result Value Ref Range Status   Specimen Description   Final    WOUND BONE Performed at [facility], [address] [place], [place] [address]    Special Requests   Final    NONE Performed at [facility], [address] [place], [place] [address]    Gram Stain   Final    NO SQUAMOUS EPITHELIAL CELLS SEEN FEW WBC SEEN NO ORGANISMS SEEN Performed at [facility], [address], [place], [place] [address]    Culture PENDING  Incomplete   Report Status PENDING  Incomplete         Radiology  Studies: DG Foot Complete Left  Result Date: 07/01/2021 CLINICAL DATA:  Postop check.  Pain. EXAM: LEFT FOOT - COMPLETE 3+ VIEW COMPARISON:  Left foot radiographs and MRI 06/29/2021 FINDINGS: Sequelae of first ray amputation are again identified with  interval resection of the residual proximal first metatarsal. Packing material and skin staples are in place, and there is regional soft tissue swelling and gas. 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 There are chronic deformities of the distal second and third metatarsals with second MTP joint ankylosis. An old fifth toe proximal phalanx fracture is also noted. No acute fracture is identified. IMPRESSION: Interval residual first metatarsal resection. Electronically Signed   By: Sebastian Ache M.D.   On: 07/01/2021 20:03   DG MINI C-ARM IMAGE ONLY  Result Date: 07/01/2021 There is no interpretation for this exam.  This order is for images obtained during a surgical procedure.  Please See "Surgeries" Tab for more information regarding the procedure.        Scheduled Meds:  docusate sodium  200 mg Oral BID   enoxaparin (LOVENOX) injection  40 mg Subcutaneous Q24H   insulin aspart  0-9 Units Subcutaneous TID WC   linezolid  600 mg Oral Q12H   sodium chloride flush  3 mL Intravenous Q12H   Continuous Infusions:  sodium chloride 75 mL/hr at 07/02/21 0255   piperacillin-tazobactam (ZOSYN)  IV 3.375 g (07/02/21 0512)     LOS: 3 days    Time spent: 21 mins     Charise Killian, MD [facility] Pager [phone]  If 7PM-7AM, please contact night-coverage 07/02/2021, 6:44 AM

## 2021-07-02 NOTE — Progress Notes (Signed)
Moderate amount of bloody drainage on surgical site dressing, ABD pad and kerlex applied as well as new ace wrap.

## 2021-07-03 LAB — BASIC METABOLIC PANEL
Anion gap: 7 (ref 5–15)
BUN: 6 mg/dL — ABNORMAL LOW (ref 8–23)
CO2: 24 mmol/L (ref 22–32)
Calcium: 8.6 mg/dL — ABNORMAL LOW (ref 8.9–10.3)
Chloride: 104 mmol/L (ref 98–111)
Creatinine, Ser: 0.67 mg/dL (ref 0.61–1.24)
GFR, Estimated: >60 mL/min (ref >60)
Glucose, Bld: 101 mg/dL — ABNORMAL HIGH (ref 70–99)
Potassium: 3.6 mmol/L (ref 3.5–5.1)
Sodium: 135 mmol/L (ref 135–145)

## 2021-07-03 LAB — CBC
HCT: 34.3 % — ABNORMAL LOW (ref 39.0–52.0)
Hemoglobin: 11.8 g/dL — ABNORMAL LOW (ref 13.0–17.0)
MCH: 36.1 pg — ABNORMAL HIGH (ref 26.0–34.0)
MCHC: 34.4 g/dL (ref 30.0–36.0)
MCV: 104.9 fL — ABNORMAL HIGH (ref 80.0–100.0)
Platelets: 381 K/uL (ref 150–400)
RBC: 3.27 MIL/uL — ABNORMAL LOW (ref 4.22–5.81)
RDW: 12.9 % (ref 11.5–15.5)
WBC: 6.5 K/uL (ref 4.0–10.5)
nRBC: 0 % (ref 0.0–0.2)

## 2021-07-03 LAB — MAGNESIUM: Magnesium: 2.1 mg/dL (ref 1.7–2.4)

## 2021-07-03 LAB — GLUCOSE, CAPILLARY
Glucose-Capillary: 136 mg/dL — ABNORMAL HIGH (ref 70–99)
Glucose-Capillary: 93 mg/dL (ref 70–99)
Glucose-Capillary: 98 mg/dL (ref 70–99)
Glucose-Capillary: 116 mg/dL — ABNORMAL HIGH (ref 70–99)

## 2021-07-03 NOTE — Progress Notes (Signed)
PROGRESS NOTE    [name] [name]  ZHY:[id number] DOB: Nov 11, 1960 DOA: 06/29/2021 PCP: Pcp, No   Assessment & Plan:   Active Problems:   Diabetic infection of left foot (HCC)   Sepsis (HCC)   Sepsis: present on admission. Meets criteria w/ tachycardia, leukocytosis and & left foot osteomyelitics. Blood cx NGTD. Continue on IV zosyn, linezolid as per ID. Procal is < 0.10. 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 Continue on linezolid, IV zosyn as per ID. B/l LE [place] was neg for DVTs. Wound cx growing gram positive cocci. 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 Continue on SSI w/ accuchecks   Hypokalemia: WNL today    Cigar smoker: received smoking cessation counseling   Macrocytic anemia: H&H are stable. Will check B12, folate levels.   DVT prophylaxis: lovenox  Code Status: full  Family Communication:  Disposition Plan: unclear   Level of care: Med-Surg  Status is: Inpatient  Remains inpatient appropriate because: severity of illness, wound cxs are still pending and still on IV abxs      Consultants:  Podiatry  ID  Procedures:   Antimicrobials: zosyn, linezolid    Subjective: Pt c/o fatigue   Objective: Vitals:   07/02/21 1107 07/02/21 1542 07/02/21 1938 07/03/21 0600  BP: 121/73 130/76 126/77 121/76  Pulse: 81 89 86 84  Resp: 16 16 18 18   Temp: 98.5 F (36.9 C) 98.4 F (36.9 C) 98.5 F (36.9 C) 98.5 F (36.9 C)  TempSrc:      SpO2: 100% 100% 100% 98%  Weight:      Height:        Intake/Output Summary (Last 24 hours) at 07/03/2021 0730 Last data filed at 07/03/2021 0520 Gross per 24 hour  Intake 1503 ml  Output 3300 ml  Net -1797 ml   Filed Weights   06/29/21 1324 07/01/21 1535  Weight: 108 kg 108 kg    Examination:  General exam: Appears comfortable  Respiratory system: clear breath sounds b/l   Cardiovascular system: S1/S2+. No rubs or clicks   Gastrointestinal system: Abd is soft, NT, ND & normal bowel sounds  Central nervous system: Alert and oriented. Moves all extremities  Psychiatry: judgement and insight appear normal. Flat mood and affect     Data Reviewed: I have personally reviewed following labs and imaging studies  CBC: Recent Labs  Lab 06/29/21 1353 06/30/21 0509 07/01/21 0544 07/02/21 0327 07/03/21 0314  WBC 11.5* 7.2 4.0 7.1 6.5  NEUTROABS 8.7*  --   --   --   --   HGB 14.6 11.5* 12.0* 11.6* 11.8*  HCT 42.4 33.7* 34.7* 34.2* 34.3*  MCV 104.4* 104.3* 105.5* 103.3* 104.9*  PLT 329 302 327 359 381   Basic Metabolic Panel: Recent Labs  Lab 06/29/21 1353 06/30/21 0509 07/01/21 0544 07/02/21 0327 07/03/21 0314  NA 134* 135 136 133* 135  K 3.4* 3.1* 4.0 3.8 3.6  CL 95* 99 105 100 104  CO2 24 25 24 25 24   GLUCOSE 119* 91 106* 100* 101*  BUN 6* 5* 8 7* 6*  CREATININE 0.71 0.68 0.80 0.62 0.67  CALCIUM 9.8 8.7* 8.8* 8.7* 8.6*  MG  --   --  2.1 1.9 2.1   GFR: Estimated Creatinine Clearance: 129.1 mL/min (by C-G formula based on SCr of 0.67 mg/dL). Liver  Function Tests: Recent Labs  Lab 06/29/21 1353  AST 17  ALT 9  ALKPHOS 57  BILITOT 1.1  PROT 7.7  ALBUMIN 3.7   No results for input(s): LIPASE, AMYLASE in the last 168 hours. No results for input(s): AMMONIA in the last 168 hours. Coagulation Profile: No results for input(s): INR, PROTIME in the last 168 hours. Cardiac Enzymes: No results for input(s): CKTOTAL, CKMB, CKMBINDEX, TROPONINI in the last 168 hours. BNP (last 3 results) No results for input(s): PROBNP in the last 8760 hours. HbA1C: No results for input(s): HGBA1C in the last 72 hours.  CBG: Recent Labs  Lab 07/01/21 2010 07/02/21 0804 07/02/21 1107 07/02/21 1627 07/02/21 2014  GLUCAP 181* 102* 124* 113* 128*   Lipid Profile: No results for input(s): CHOL, HDL, LDLCALC, TRIG, CHOLHDL, LDLDIRECT in the last 72  hours. Thyroid Function Tests: No results for input(s): TSH, T4TOTAL, FREET4, T3FREE, THYROIDAB in the last 72 hours. Anemia Panel: No results for input(s): VITAMINB12, FOLATE, FERRITIN, TIBC, IRON, RETICCTPCT in the last 72 hours. Sepsis Labs: Recent Labs  Lab 06/29/21 1353 06/29/21 1830 06/30/21 0509  PROCALCITON  --  <0.10 <0.10  LATICACIDVEN 2.5* 1.6  --     Recent Results (from the past 240 hour(s))  Resp Panel by RT-PCR (Flu A&B, Covid) Nasopharyngeal Swab     Status: None   Collection Time: 06/29/21  5:20 PM   Specimen: Nasopharyngeal Swab; Nasopharyngeal(NP) swabs in vial transport medium  Result Value Ref Range Status   SARS Coronavirus 2 by RT PCR NEGATIVE NEGATIVE Final    Comment: (NOTE) SARS-CoV-2 target nucleic acids are NOT DETECTED.  The SARS-CoV-2 RNA is generally detectable in upper respiratory specimens during the acute phase of infection. The lowest concentration of SARS-CoV-2 viral copies this assay can detect is 138 copies/mL. A negative result does not preclude SARS-Cov-2 infection and should not be used as the sole basis for treatment or other patient management decisions. A negative result may occur with  improper specimen collection/handling, submission of specimen other than nasopharyngeal swab, presence of viral mutation(s) within the areas targeted by this assay, and inadequate number of viral copies(<138 copies/mL). A negative result must be combined with clinical observations, patient history, and epidemiological information. The expected result is Negative.  Fact Sheet for Patients:  BloggerCourse.com  Fact Sheet for Healthcare Providers:  SeriousBroker.it  This test is no t yet approved or cleared by the [place] FDA and  has been authorized for detection and/or diagnosis of SARS-CoV-2 by FDA under an Emergency Use Authorization (EUA). This EUA will remain  in effect (meaning this test  can be used) for the duration of the COVID-19 declaration under Section 564(b)(1) of the Act, 21 U.S.C.section 360bbb-3(b)(1), unless the authorization is terminated  or revoked sooner.       Influenza A by PCR NEGATIVE NEGATIVE Final   Influenza B by PCR NEGATIVE NEGATIVE Final    Comment: (NOTE) The Xpert Xpress SARS-CoV-2/FLU/RSV plus assay is intended as an aid in the diagnosis of influenza from Nasopharyngeal swab specimens and should not be used as a sole basis for treatment. Nasal washings and aspirates are unacceptable for Xpert Xpress SARS-CoV-2/FLU/RSV testing.  Fact Sheet for Patients: BloggerCourse.com  Fact Sheet for Healthcare Providers: SeriousBroker.it  This test is not yet approved or cleared by the [place] FDA and has been authorized for detection and/or diagnosis of SARS-CoV-2 by FDA under an Emergency Use Authorization (EUA). This EUA will remain in effect (meaning this test can  be used) for the duration of the COVID-19 declaration under Section 564(b)(1) of the Act, 21 U.S.C. section 360bbb-3(b)(1), unless the authorization is terminated or revoked.  Performed at [facility], [address] [place], [place] [address]   CULTURE, BLOOD (ROUTINE X 2) w Reflex to ID Panel     Status: None (Preliminary result)   Collection Time: 06/29/21  6:36 PM   Specimen: BLOOD  Result Value Ref Range Status   Specimen Description BLOOD LEFT ANTECUBITAL  Final   Special Requests   Final    BOTTLES DRAWN AEROBIC AND ANAEROBIC Blood Culture adequate volume   Culture   Final    NO GROWTH 4 DAYS Performed at [facility], [address], [place], [place] [address]    Report Status PENDING  Incomplete  CULTURE, BLOOD (ROUTINE X 2) w Reflex to ID Panel     Status: None (Preliminary result)   Collection Time: 06/29/21  6:36 PM   Specimen: BLOOD  Result Value Ref Range Status   Specimen Description  BLOOD BLOOD LEFT HAND  Final   Special Requests   Final    BOTTLES DRAWN AEROBIC ONLY Blood Culture results may not be optimal due to an inadequate volume of blood received in culture bottles   Culture   Final    NO GROWTH 4 DAYS Performed at [facility], [address] [place], [place] [address]    Report Status PENDING  Incomplete  Aerobic/Anaerobic Culture w Gram Stain (surgical/deep wound)     Status: None (Preliminary result)   Collection Time: 06/29/21  7:50 PM   Specimen: Wound  Result Value Ref Range Status   Specimen Description   Final    WOUND Performed at [facility], [address], [place], [place] [address]    Special Requests   Final    LEFT FOOT Performed at [facility], [address] [place], [place] [address]    Gram Stain   Final    NO SQUAMOUS EPITHELIAL CELLS SEEN NO WBC SEEN FEW GRAM POSITIVE COCCI Performed at [facility], [address], [place], [place] [address]    Culture   Final    CULTURE REINCUBATED FOR BETTER GROWTH NO ANAEROBES ISOLATED; CULTURE IN PROGRESS FOR 5 DAYS    Report Status PENDING  Incomplete  Aerobic/Anaerobic Culture w Gram Stain (surgical/deep wound)     Status: None (Preliminary result)   Collection Time: 07/01/21  5:12 PM   Specimen: PATH Bone biopsy; Tissue  Result Value Ref Range Status   Specimen Description   Final    WOUND BONE Performed at [facility], [address] [place], [place] [address]    Special Requests   Final    NONE Performed at [facility], [address] [place], [place] [address]    Gram Stain   Final    NO SQUAMOUS EPITHELIAL CELLS SEEN FEW WBC SEEN NO ORGANISMS SEEN    Culture   Final    NO GROWTH < 12 HOURS Performed at [facility], [address], [place], [place] [address]    Report Status PENDING  Incomplete         Radiology Studies: DG Foot Complete Left  Result Date: 07/01/2021 CLINICAL DATA:  Postop  check.  Pain. EXAM: LEFT FOOT - COMPLETE 3+ VIEW COMPARISON:  Left foot radiographs and MRI 06/29/2021 FINDINGS: Sequelae of first ray amputation are again identified with interval resection of the residual  proximal first metatarsal. Packing material and skin staples are in place, and there is regional soft tissue swelling and gas. 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 There are chronic deformities of the distal second and third metatarsals with second MTP joint ankylosis. An old fifth toe proximal phalanx fracture is also noted. No acute fracture is identified. IMPRESSION: Interval residual first metatarsal resection. Electronically Signed   By: Sebastian Ache M.D.   On: 07/01/2021 20:03   DG MINI C-ARM IMAGE ONLY  Result Date: 07/01/2021 There is no interpretation for this exam.  This order is for images obtained during a surgical procedure.  Please See "Surgeries" Tab for more information regarding the procedure.        Scheduled Meds:  docusate sodium  200 mg Oral BID   enoxaparin (LOVENOX) injection  40 mg Subcutaneous Q24H   insulin aspart  0-9 Units Subcutaneous TID WC   linezolid  600 mg Oral Q12H   sodium chloride flush  3 mL Intravenous Q12H   Continuous Infusions:  sodium chloride 10 mL/hr at 07/03/21 0518   piperacillin-tazobactam (ZOSYN)  IV 3.375 g (07/03/21 0520)     LOS: 4 days    Time spent: 15 mins     Charise Killian, MD [facility] Pager [phone]  If 7PM-7AM, please contact night-coverage 07/03/2021, 7:30 AM

## 2021-07-03 NOTE — Op Note (Signed)
OPERATIVE REPORT Patient name: [name] [name] MRN: [id number] DOB: 05/19/1961  DOS: 07/01/2021  Preop Dx: Postop Dx: same  Procedure:  1.  First ray amputation RT foot  Surgeon: Felecia Shelling DPM  Anesthesia: 50-50 mixture of 2% lidocaine plain with 0.5% Marcaine plain totaling 10 mL infiltrated in the patient's right lower extremity  Hemostasis: Ankle tourniquet inflated to a pressure of after esmarch exsanguination   EBL: 10 mL Materials: None Injectables: None Pathology: First metatarsal bone sent for culture and microscopic exam  Condition: The patient tolerated the procedure and anesthesia well. No complications noted or reported   Justification for procedure: The patient is a 60 y.o. male who presents today for surgical correction of acute osteomyelitis to the right foot. All conservative modalities of been unsuccessful in providing any sort of satisfactory alleviation of symptoms with the patient. The patient was told benefits as well as possible side effects of the surgery. The patient consented for surgical correction. The patient consent form was reviewed. All patient questions were answered. No guarantees were expressed or implied. The patient and the surgeon boson the patient consent form with the witness present and placed in the patient's chart.   Procedure in Detail: The patient was brought to the operating room, placed in the operating table in the supine position at which time an aseptic scrub and drape were performed about the patient's respective lower extremity after anesthesia was induced as described above. Attention was then directed to the surgical area where procedure number one commenced.  Procedure #1: First ray amputation/resection RT foot A linear longitudinal skin incision was planned and made overlying the remaining portion of the first ray of the right foot.  Incision was carried down to the level of bone with care taken to cut clamp ligate  or retract away all small neurovascular structures traversing the incision site.  The EDL tendon was identified and resected as far proximal as could be visualized.  Additional soft tissue dissection was carried down to the level of bone to expose the remaining portion of the first metatarsal.  The first metatarsal was freed from any surrounding soft tissue and disarticulated at the metatarsal cuneiform joint.  The first metatarsal was removed and portions.  A portion of the bone was sent to pathology for culture as well as microscopic exam.  Additional cultures were also taken within the incision site.  First ray resection was verified by intraoperative x-ray fluoroscopy which was satisfactory.  Copious irrigation was then utilized and additional debridement and resection of necrotic nonviable tissue was performed.  Primary closure was obtained using stainless steel skin staples and reinforced with nylon suture.  Packing was applied.  Dry sterile compressive dressings were then applied to all previously mentioned incision sites about the patient's lower extremity. The tourniquet which was used for hemostasis was deflated. All normal neurovascular responses including pink color and warmth returned all the digits of patient's lower extremity.  The patient was then transferred from the operating room to the recovery room having tolerated the procedure and anesthesia well. All vital signs are stable. After a brief stay in the recovery room the patient was readmitted to inpatient room with adequate prescriptions for analgesia. Verbal as well as written instructions were provided for the patient regarding wound care. The patient is to keep the dressings clean dry and intact until they are to follow surgeon Dr. Gala Lewandowsky in the office upon discharge.   Felecia Shelling, DPM [facility]  Dr. Felecia Shelling, DPM    [address] [place], [place] [address]                 Office [phone]  Fax [phone]

## 2021-07-04 DIAGNOSIS — D75839 Thrombocytosis, unspecified: Secondary | ICD-10-CM

## 2021-07-04 DIAGNOSIS — D519 Vitamin B12 deficiency anemia, unspecified: Secondary | ICD-10-CM

## 2021-07-04 LAB — BASIC METABOLIC PANEL WITH GFR
Anion gap: 8 (ref 5–15)
BUN: 10 mg/dL (ref 8–23)
CO2: 26 mmol/L (ref 22–32)
Calcium: 9.2 mg/dL (ref 8.9–10.3)
Chloride: 104 mmol/L (ref 98–111)
Creatinine, Ser: 0.8 mg/dL (ref 0.61–1.24)
GFR, Estimated: >60 mL/min
Glucose, Bld: 105 mg/dL — ABNORMAL HIGH (ref 70–99)
Potassium: 3.9 mmol/L (ref 3.5–5.1)
Sodium: 138 mmol/L (ref 135–145)

## 2021-07-04 LAB — CULTURE, BLOOD (ROUTINE X 2)
Culture: NO GROWTH
Culture: NO GROWTH
Special Requests: ADEQUATE

## 2021-07-04 LAB — GLUCOSE, CAPILLARY
Glucose-Capillary: 173 mg/dL — ABNORMAL HIGH (ref 70–99)
Glucose-Capillary: 108 mg/dL — ABNORMAL HIGH (ref 70–99)

## 2021-07-04 LAB — CBC
HCT: 35.9 % — ABNORMAL LOW (ref 39.0–52.0)
Hemoglobin: 11.9 g/dL — ABNORMAL LOW (ref 13.0–17.0)
MCH: 34.8 pg — ABNORMAL HIGH (ref 26.0–34.0)
MCHC: 33.1 g/dL (ref 30.0–36.0)
MCV: 105 fL — ABNORMAL HIGH (ref 80.0–100.0)
Platelets: 407 K/uL — ABNORMAL HIGH (ref 150–400)
RBC: 3.42 MIL/uL — ABNORMAL LOW (ref 4.22–5.81)
RDW: 12.8 % (ref 11.5–15.5)
WBC: 6.4 K/uL (ref 4.0–10.5)
nRBC: 0 % (ref 0.0–0.2)

## 2021-07-04 LAB — VITAMIN B12: Vitamin B-12: 121 pg/mL — ABNORMAL LOW (ref 180–914)

## 2021-07-04 LAB — FOLATE: Folate: 6.6 ng/mL (ref >5.9)

## 2021-07-04 LAB — MAGNESIUM: Magnesium: 2.1 mg/dL (ref 1.7–2.4)

## 2021-07-04 MED ORDER — CYANOCOBALAMIN 1000 MCG/ML IJ SOLN
1000.0000 ug | Freq: Once | INTRAMUSCULAR | Status: AC
Start: 2021-07-04 — End: 2021-07-04
  Administered 2021-07-04: 1000 ug via INTRAMUSCULAR
  Filled 2021-07-04: qty 1

## 2021-07-04 MED ORDER — SULFAMETHOXAZOLE-TRIMETHOPRIM 800-160 MG PO TABS
1.0000 | ORAL_TABLET | Freq: Two times a day (BID) | ORAL | Status: DC
  Administered 2021-07-05: 1 via ORAL
  Filled 2021-07-04: qty 1

## 2021-07-04 MED ORDER — AMOXICILLIN-POT CLAVULANATE 875-125 MG PO TABS
1.0000 | ORAL_TABLET | Freq: Two times a day (BID) | ORAL | Status: DC
  Administered 2021-07-05: 1 via ORAL
  Filled 2021-07-04: qty 1

## 2021-07-04 MED ORDER — VITAMIN B-12 1000 MCG PO TABS
500.0000 ug | ORAL_TABLET | Freq: Every day | ORAL | Status: DC
  Administered 2021-07-05: 500 ug via ORAL
  Filled 2021-07-04: qty 1

## 2021-07-04 NOTE — Progress Notes (Signed)
PROGRESS NOTE    [name] [name]  IOE:[id number] DOB: 1961/08/27 DOA: 06/29/2021 PCP: Pcp, No   Assessment & Plan:   Active Problems:   Diabetic infection of left foot (HCC)   Sepsis (HCC)   Sepsis: present on admission. Meets criteria w/ tachycardia, leukocytosis and & left foot osteomyelitics. Blood cx NGTD. Continue on IV zosyn as per ID. Procal is < 0.10. 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 Continue on IV zosyn as per ID. B/l LE [place] was neg for DVTs. Wound cxs growing proteus penneri, enterobacter cloacae, & staph simulans. 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 Continue on SSI w/ accuchecks   Hypokalemia: within normal limits    Cigar smoker: received smoking cessation counseling   Macrocytic anemia: H&H are stable. B12 is low. Will IM B12 x 1 and start po B12 in AM   Thrombocytosis: etiology unclear. Likely reactive. Will continue to monitor   DVT prophylaxis: lovenox  Code Status: full  Family Communication:  Disposition Plan: unclear   Level of care: Med-Surg  Status is: Inpatient  Remains inpatient appropriate because: severity of illness, wound cxs are still pending and still on IV abxs      Consultants:  Podiatry  ID  Procedures:   Antimicrobials: zosyn   Subjective: Pt c/o malaise   Objective: Vitals:   07/03/21 0739 07/03/21 1540 07/03/21 2055 07/04/21 0548  BP: 118/78 110/70 116/76 117/83  Pulse: 84 87 79 84  Resp: 15 16 18 16   Temp: 97.9 F (36.6 C) 98.2 F (36.8 C) 98.1 F (36.7 C) 98.6 F (37 C)  TempSrc:  Oral    SpO2: 95% 98% 99% 95%  Weight:      Height:        Intake/Output Summary (Last 24 hours) at 07/04/2021 0726 Last data filed at 07/04/2021 13/02/2021 Gross per 24 hour  Intake 1796.2 ml  Output 2000 ml  Net -203.8 ml   Filed Weights   06/29/21 1324 07/01/21 1535  Weight:  108 kg 108 kg    Examination:  General exam: Appears calm and comfortable  Respiratory system: clear breath sounds b/l  Cardiovascular system: S1 & S2+. No rubs or clicks  Gastrointestinal system: Abd is soft, NT, ND & hypoactive bowel sounds  Central nervous system: alert and oriented. Moves all extremities  Psychiatry: judgement and insight appears normal. Flat mood and affect    Data Reviewed: I have personally reviewed following labs and imaging studies  CBC: Recent Labs  Lab 06/29/21 1353 06/30/21 0509 07/01/21 0544 07/02/21 0327 07/03/21 0314 07/04/21 0436  WBC 11.5* 7.2 4.0 7.1 6.5 6.4  NEUTROABS 8.7*  --   --   --   --   --   HGB 14.6 11.5* 12.0* 11.6* 11.8* 11.9*  HCT 42.4 33.7* 34.7* 34.2* 34.3* 35.9*  MCV 104.4* 104.3* 105.5* 103.3* 104.9* 105.0*  PLT 329 302 327 359 381 407*   Basic Metabolic Panel: Recent Labs  Lab 06/29/21 1353 06/30/21 0509 07/01/21 0544 07/02/21 0327 07/03/21 0314  NA 134* 135 136 133* 135  K 3.4* 3.1* 4.0 3.8 3.6  CL 95* 99 105 100 104  CO2 24 25 24 25 24   GLUCOSE 119* 91 106* 100* 101*  BUN 6* 5* 8 7* 6*  CREATININE 0.71 0.68 0.80 0.62 0.67  CALCIUM 9.8  8.7* 8.8* 8.7* 8.6*  MG  --   --  2.1 1.9 2.1   GFR: Estimated Creatinine Clearance: 129.1 mL/min (by C-G formula based on SCr of 0.67 mg/dL). Liver Function Tests: Recent Labs  Lab 06/29/21 1353  AST 17  ALT 9  ALKPHOS 57  BILITOT 1.1  PROT 7.7  ALBUMIN 3.7   No results for input(s): LIPASE, AMYLASE in the last 168 hours. No results for input(s): AMMONIA in the last 168 hours. Coagulation Profile: No results for input(s): INR, PROTIME in the last 168 hours. Cardiac Enzymes: No results for input(s): CKTOTAL, CKMB, CKMBINDEX, TROPONINI in the last 168 hours. BNP (last 3 results) No results for input(s): PROBNP in the last 8760 hours. HbA1C: No results for input(s): HGBA1C in the last 72 hours.  CBG: Recent Labs  Lab 07/02/21 2014 07/03/21 0858  07/03/21 1210 07/03/21 1711 07/03/21 2042  GLUCAP 128* 136* 93 98 116*   Lipid Profile: No results for input(s): CHOL, HDL, LDLCALC, TRIG, CHOLHDL, LDLDIRECT in the last 72 hours. Thyroid Function Tests: No results for input(s): TSH, T4TOTAL, FREET4, T3FREE, THYROIDAB in the last 72 hours. Anemia Panel: No results for input(s): VITAMINB12, FOLATE, FERRITIN, TIBC, IRON, RETICCTPCT in the last 72 hours. Sepsis Labs: Recent Labs  Lab 06/29/21 1353 06/29/21 1830 06/30/21 0509  PROCALCITON  --  <0.10 <0.10  LATICACIDVEN 2.5* 1.6  --     Recent Results (from the past 240 hour(s))  Resp Panel by RT-PCR (Flu A&B, Covid) Nasopharyngeal Swab     Status: None   Collection Time: 06/29/21  5:20 PM   Specimen: Nasopharyngeal Swab; Nasopharyngeal(NP) swabs in vial transport medium  Result Value Ref Range Status   SARS Coronavirus 2 by RT PCR NEGATIVE NEGATIVE Final    Comment: (NOTE) SARS-CoV-2 target nucleic acids are NOT DETECTED.  The SARS-CoV-2 RNA is generally detectable in upper respiratory specimens during the acute phase of infection. The lowest concentration of SARS-CoV-2 viral copies this assay can detect is 138 copies/mL. A negative result does not preclude SARS-Cov-2 infection and should not be used as the sole basis for treatment or other patient management decisions. A negative result may occur with  improper specimen collection/handling, submission of specimen other than nasopharyngeal swab, presence of viral mutation(s) within the areas targeted by this assay, and inadequate number of viral copies(<138 copies/mL). A negative result must be combined with clinical observations, patient history, and epidemiological information. The expected result is Negative.  Fact Sheet for Patients:  BloggerCourse.com  Fact Sheet for Healthcare Providers:  SeriousBroker.it  This test is no t yet approved or cleared by the [place] FDA and  has been authorized for detection and/or diagnosis of SARS-CoV-2 by FDA under an Emergency Use Authorization (EUA). This EUA will remain  in effect (meaning this test can be used) for the duration of the COVID-19 declaration under Section 564(b)(1) of the Act, 21 U.S.C.section 360bbb-3(b)(1), unless the authorization is terminated  or revoked sooner.       Influenza A by PCR NEGATIVE NEGATIVE Final   Influenza B by PCR NEGATIVE NEGATIVE Final    Comment: (NOTE) The Xpert Xpress SARS-CoV-2/FLU/RSV plus assay is intended as an aid in the diagnosis of influenza from Nasopharyngeal swab specimens and should not be used as a sole basis for treatment. Nasal washings and aspirates are unacceptable for Xpert Xpress SARS-CoV-2/FLU/RSV testing.  Fact Sheet for Patients: BloggerCourse.com  Fact Sheet for Healthcare Providers: SeriousBroker.it  This test is not yet approved or cleared  by the [place] and has been authorized for detection and/or diagnosis of SARS-CoV-2 by FDA under an Emergency Use Authorization (EUA). This EUA will remain in effect (meaning this test can be used) for the duration of the COVID-19 declaration under Section 564(b)(1) of the Act, 21 U.S.C. section 360bbb-3(b)(1), unless the authorization is terminated or revoked.  Performed at [facility], [address] [place], [place] [address]   CULTURE, BLOOD (ROUTINE X 2) w Reflex to ID Panel     Status: None (Preliminary result)   Collection Time: 06/29/21  6:36 PM   Specimen: BLOOD  Result Value Ref Range Status   Specimen Description BLOOD LEFT ANTECUBITAL  Final   Special Requests   Final    BOTTLES DRAWN AEROBIC AND ANAEROBIC Blood Culture adequate volume   Culture   Final    NO GROWTH 4 DAYS Performed at [facility], [address] [place], [place] [address]    Report Status PENDING  Incomplete  CULTURE, BLOOD  (ROUTINE X 2) w Reflex to ID Panel     Status: None (Preliminary result)   Collection Time: 06/29/21  6:36 PM   Specimen: BLOOD  Result Value Ref Range Status   Specimen Description BLOOD BLOOD LEFT HAND  Final   Special Requests   Final    BOTTLES DRAWN AEROBIC ONLY Blood Culture results may not be optimal due to an inadequate volume of blood received in culture bottles   Culture   Final    NO GROWTH 4 DAYS Performed at [facility], [address] [place], [place] [address]    Report Status PENDING  Incomplete  Aerobic/Anaerobic Culture w Gram Stain (surgical/deep wound)     Status: None (Preliminary result)   Collection Time: 06/29/21  7:50 PM   Specimen: Wound  Result Value Ref Range Status   Specimen Description   Final    WOUND Performed at [facility], [address], [place], [place] [address]    Special Requests   Final    LEFT FOOT Performed at [facility], [address] [place], [place] [address]    Gram Stain   Final    NO SQUAMOUS EPITHELIAL CELLS SEEN NO WBC SEEN FEW GRAM POSITIVE COCCI Performed at [facility], [address], [place], [place] [address]    Culture   Final    FEW PROTEUS PENNERI RARE GRAM NEGATIVE RODS IDENTIFICATION AND SUSCEPTIBILITIES TO FOLLOW NO ANAEROBES ISOLATED; CULTURE IN PROGRESS FOR 5 DAYS    Report Status PENDING  Incomplete  Aerobic/Anaerobic Culture w Gram Stain (surgical/deep wound)     Status: None (Preliminary result)   Collection Time: 07/01/21  5:12 PM   Specimen: PATH Bone biopsy; Tissue  Result Value Ref Range Status   Specimen Description   Final    WOUND BONE Performed at [facility], [address], [place], [place] [address]    Special Requests   Final    NONE Performed at [facility], [address] [place], [place] [address]    Gram Stain   Final    NO SQUAMOUS EPITHELIAL CELLS SEEN FEW WBC SEEN NO ORGANISMS SEEN    Culture   Final    RARE  STAPHYLOCOCCUS SIMULANS NO ANAEROBES ISOLATED; CULTURE IN PROGRESS FOR 5 DAYS CULTURE REINCUBATED FOR BETTER GROWTH Performed at [facility], [address] [place], [place] [address]    Report Status PENDING  Incomplete  Radiology Studies: No results found.      Scheduled Meds:  docusate sodium  200 mg Oral BID   enoxaparin (LOVENOX) injection  40 mg Subcutaneous Q24H   insulin aspart  0-9 Units Subcutaneous TID WC   sodium chloride flush  3 mL Intravenous Q12H   Continuous Infusions:  sodium chloride 10 mL/hr at 07/03/21 0518   piperacillin-tazobactam (ZOSYN)  IV 3.375 g (07/04/21 3276)     LOS: 5 days    Time spent: 15 mins     Charise Killian, MD [facility] Pager [phone]  If 7PM-7AM, please contact night-coverage 07/04/2021, 7:26 AM

## 2021-07-04 NOTE — Progress Notes (Signed)
ID Pt admitted with left foot infection at the site of previous great toe amputation in May 2022 He did not follow up Podiatrist since then  He underwent ray excision on 07/01/21  Doing well No specific complaints BP 110/66 (BP Location: Left Arm)   Pulse 81   Temp 98.1 F (36.7 C)   Resp 14   Ht 6\' 4"  (1.93 m)   Wt 108 kg   SpO2 99%   BMI 28.98 kg/m   Awake and alert Chest CTA Hss1s2 Abd soft Venous edema legs Left foot dressing not removed   CNS non focal  Labs CBC Latest Ref Rng & Units 07/04/2021 07/03/2021 07/02/2021  WBC 4.0 - 10.5 K/uL 6.4 6.5 7.1  Hemoglobin 13.0 - 17.0 g/dL 11.9(L) 11.8(L) 11.6(L)  Hematocrit 39.0 - 52.0 % 35.9(L) 34.3(L) 34.2(L)  Platelets 150 - 400 K/uL 407(H) 381 359    CMP Latest Ref Rng & Units 07/04/2021 07/03/2021 07/02/2021  Glucose 70 - 99 mg/dL 13/12/2020) 081(K) 481(E)  BUN 8 - 23 mg/dL 10 6(L) 7(L)  Creatinine 0.61 - 1.24 mg/dL 563(J 4.97 0.26  Sodium 135 - 145 mmol/L 138 135 133(L)  Potassium 3.5 - 5.1 mmol/L 3.9 3.6 3.8  Chloride 98 - 111 mmol/L 104 104 100  CO2 22 - 32 mmol/L 26 24 25   Calcium 8.9 - 10.3 mg/dL 9.2 3.78) )  Total Protein 6.5 - 8.1 g/dL - - -  Total Bilirubin 0.3 - 1.2 mg/dL - - -  Alkaline Phos 38 - 126 U/L - - -  AST 15 - 41 U/L - - -  ALT 0 - 44 U/L - - -     Micro 06/29/21 Wound culture- enterobacter cloacae Proteus  07/01/21 Bone culture -Staph simulans  Pathology=- 06/29/21 Bone acute osteo  Impression/recommendation ? Diabetic foot infection- left foot at the site of previous first  ray excision site Underwent complete ray excision on 07/01/21 Pathology pending Bone culture- staph simulans Wound culture from 11/2 has enterobacter, proteus Pt currently on zosyn Pt does not want IV antibiotics on discharge Because of multiple social issues Wound looks stable- will be able to do PO bactrim+ augmentin- depending on the pathology of the margin will choose 2 weeks VS 4 weeks Will follow up as OP    DM-  - Hba1c 5.3 on 06/30/21   Anemia   Discussed the management with aptient and care team

## 2021-07-04 NOTE — TOC Progression Note (Signed)
Transition of Care [facility]) - Progression Note    Patient Details  Name: [name] [name] MRN: [id number] Date of Birth: Mar 07, 1961  [facility]) CM/SW Contact  Caryn Section, RN Phone Number: 07/04/2021, 4:19 PM  Clinical Narrative:   11/7 Patient has no electricity, will work on connecting with DSS case worker and have patient leave amount he owes and willlook into charitable foundation to assist.    Expected Discharge Plan: Home w Home Health Services Barriers to Discharge: Inadequate or no insurance, Continued Medical Work up  Expected Discharge Plan and Services Expected Discharge Plan: Home w Home Health Services In-house Referral: Artist, PCP / Health Connect Discharge Planning Services: CM Consult Post Acute Care Choice:  (TBD would like Home Health) Living arrangements for the past 2 months: Single Family Home                 DME Arranged:  (TBD)         HH Arranged:  (See narrative notes)           Social Determinants of Health (SDOH) Interventions    Readmission Risk Interventions No flowsheet data found.

## 2021-07-05 ENCOUNTER — Other Ambulatory Visit: Payer: Self-pay

## 2021-07-05 LAB — BASIC METABOLIC PANEL
Anion gap: 8 (ref 5–15)
BUN: 9 mg/dL (ref 8–23)
CO2: 25 mmol/L (ref 22–32)
Calcium: 9.3 mg/dL (ref 8.9–10.3)
Chloride: 104 mmol/L (ref 98–111)
Creatinine, Ser: 0.83 mg/dL (ref 0.61–1.24)
GFR, Estimated: >60 mL/min (ref >60)
Glucose, Bld: 99 mg/dL (ref 70–99)
Potassium: 4.1 mmol/L (ref 3.5–5.1)
Sodium: 137 mmol/L (ref 135–145)

## 2021-07-05 LAB — GLUCOSE, CAPILLARY
Glucose-Capillary: 139 mg/dL — ABNORMAL HIGH (ref 70–99)
Glucose-Capillary: 106 mg/dL — ABNORMAL HIGH (ref 70–99)
Glucose-Capillary: 91 mg/dL (ref 70–99)

## 2021-07-05 LAB — CBC
HCT: 36.7 % — ABNORMAL LOW (ref 39.0–52.0)
Hemoglobin: 12.2 g/dL — ABNORMAL LOW (ref 13.0–17.0)
MCH: 34.6 pg — ABNORMAL HIGH (ref 26.0–34.0)
MCHC: 33.2 g/dL (ref 30.0–36.0)
MCV: 104 fL — ABNORMAL HIGH (ref 80.0–100.0)
Platelets: 440 10*3/uL — ABNORMAL HIGH (ref 150–400)
RBC: 3.53 MIL/uL — ABNORMAL LOW (ref 4.22–5.81)
RDW: 12.7 % (ref 11.5–15.5)
WBC: 6.3 10*3/uL (ref 4.0–10.5)
nRBC: 0 % (ref 0.0–0.2)

## 2021-07-05 LAB — AEROBIC/ANAEROBIC CULTURE W GRAM STAIN (SURGICAL/DEEP WOUND): Gram Stain: NONE SEEN

## 2021-07-05 LAB — MAGNESIUM: Magnesium: 2.1 mg/dL (ref 1.7–2.4)

## 2021-07-05 MED ORDER — AMOXICILLIN-POT CLAVULANATE 875-125 MG PO TABS
1.0000 | ORAL_TABLET | Freq: Two times a day (BID) | ORAL | 0 refills | Status: AC
  Filled 2021-07-05: qty 60, 30d supply, fill #0

## 2021-07-05 MED ORDER — CYANOCOBALAMIN 500 MCG PO TABS
500.0000 ug | ORAL_TABLET | Freq: Every day | ORAL | 0 refills | Status: AC
  Filled 2021-07-05 – 2021-07-06 (×2): qty 30, 30d supply, fill #0

## 2021-07-05 MED ORDER — SULFAMETHOXAZOLE-TRIMETHOPRIM 800-160 MG PO TABS
1.0000 | ORAL_TABLET | Freq: Two times a day (BID) | ORAL | 0 refills | Status: AC
  Filled 2021-07-05: qty 60, 30d supply, fill #0

## 2021-07-05 MED ORDER — OXYCODONE-ACETAMINOPHEN 5-325 MG PO TABS: 1.0000 | ORAL_TABLET | Freq: Four times a day (QID) | ORAL | 0 refills | Status: AC | PRN

## 2021-07-05 NOTE — Progress Notes (Signed)
Discharge Note: Reviewed discharge instructions with pt. Pt verbalized understanding. Notified CM of CAM walker prior d/ced. IV cath  intact upon removal. Provided address to med management and reminded pt to pick up medications 07/06/21. Staff wheeled pt out. Pt transported to home via family(brother) private vehicle.

## 2021-07-05 NOTE — TOC Progression Note (Signed)
Transition of Care [facility]) - Progression Note    Patient Details  Name: [name] [name] MRN: [id number] Date of Birth: 01-Dec-1960  Transition [facility] CM/SW Contact  Caryn Section, RN Phone Number: 07/05/2021, 4:23 PM  Clinical Narrative:   Patient being discharged home, states he has had no electricity for an extended period of time and feels comfortable and safe going home.  RN will pick upmeds at medication management.   RNCM notified charitable for electric assistance    Expected Discharge Plan: Home w Home Health Services Barriers to Discharge: Inadequate or no insurance, Continued Medical Work up  Expected Discharge Plan and Services Expected Discharge Plan: Home w Home Health Services In-house Referral: Artist, PCP / Health Connect Discharge Planning Services: CM Consult Post Acute Care Choice:  (TBD would like Home Health) Living arrangements for the past 2 months: Single Family Home Expected Discharge Date: 07/05/21               DME Arranged:  (TBD)         HH Arranged:  (See narrative notes)           Social Determinants of Health (SDOH) Interventions    Readmission Risk Interventions No flowsheet data found.

## 2021-07-05 NOTE — Discharge Summary (Signed)
Physician Discharge Summary  [name] [name] OZH:[id number] DOB: [birth year] DOA: 06/29/2021  PCP: Oneita Hurt, No  Admit date: 06/29/2021 Discharge date: 07/05/2021  Admitted From: home  Disposition:  home   Recommendations for Outpatient Follow-up:  Follow up with PCP in 1-2 weeks F/u w/ podiatry, Dr. Logan Bores, in 1 week F/u w/ ID, Dr. Rivka Safer in 7-10 days  Home Health: Equipment/Devices:  Discharge Condition: stable  CODE STATUS: full  Diet recommendation: Heart Healthy / Carb Modified   Brief/Interim Summary:  HPI: 60 y/o M w/ PMH of DM2, cigar smoker who presents w/ LLE pain and swelling x 1 day. The pain is sharp, constant w/o radiation. Walking makes the pain worse and nothing makes the pain better. Pt did not try taking any NSAIDs or tylenol. The severity of the pain 6/10. Pt denies any fevers, chills, sweating, cough, chest pain, shortness of breath, nausea, vomiting, abd pain, dysuria, diarrhea or constipation. Pt c/o urinary frequency and urgency x 1 year. Of note, pt lives alone in a house w/o electricity but does have running water. Pt does not have a PCP, podiatrist or any outpatient physicians.  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 Pt had amputation of left ray and bone biopsy on 07/01/21 as per podiatry. Pt was on IV zosyn and then switched to po augmentin and bactrim x 4 weeks as per ID. Pt will f/u w/ podiatry, Dr. Logan Bores in 1 week and f/u w/ ID, Dr. Rivka Safer in 7-10 days. Pt verbalized his understanding.   Of note, pt lives in a home w/o electricity as he cannot afford it. CM called a charity to see if they could pay for his lights to be turned back on but this will not be processed today but hopefully the pt will have lights turned back on again soon. Pt was made aware   Discharge Diagnoses:  Active Problems:   Diabetic infection of left foot (HCC)   Sepsis (HCC)  Sepsis: present on admission. Meets criteria w/ tachycardia, leukocytosis and & left  foot osteomyelitics. Blood cx NGTD. IV zosyn was changed to po augmentin and bactrim at d/c x 4 weeks as ID. Procal is < 0.10. 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 Continue on IV zosyn as per ID. B/l LE [place] was neg for DVTs. Wound cxs growing proteus penneri, enterobacter cloacae, & staph simulans. 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 Continue on SSI w/ accuchecks   Hypokalemia: within normal limits    Cigar smoker: received smoking cessation counseling   Macrocytic anemia: H&H are stable. B12 is low. S/p B12 x 1 and continue po B12   Thrombocytosis: etiology unclear. Likely reactive. Will continue to monitor   Discharge Instructions  Discharge Instructions     Diet - low sodium heart healthy   Complete by: As directed    Discharge instructions   Complete by: As directed    F/u w/ podiatry, Dr. Logan Bores, in 1 week. F/u w/ ID, Dr. Rivka Safer, in 7-10 days. F/u w/ PCP in 2 weeks   Increase activity slowly   Complete by: As directed    No wound care   Complete by: As directed       Allergies as of 07/05/2021   No Known Allergies      Medication List  TAKE these medications    amoxicillin-clavulanate 875-125 MG tablet Commonly known as: AUGMENTIN Take 1 tablet by mouth every 12 (twelve) hours.   nicotine 14 mg/24hr patch Commonly known as: NICODERM CQ - dosed in mg/24 hours Place 1 patch (14 mg total) onto the skin daily.   oxyCODONE-acetaminophen 5-325 MG tablet Commonly known as: PERCOCET/ROXICET Take 1 tablet by mouth every 6 (six) hours as needed for up to 3 days for moderate pain or severe pain.   polyethylene glycol 17 g packet Commonly known as: MIRALAX / GLYCOLAX Take 17 g by mouth daily as needed for mild constipation.   sulfamethoxazole-trimethoprim 800-160 MG tablet Commonly known as: BACTRIM DS Take 1  tablet by mouth every 12 (twelve) hours.   vitamin B-12 500 MCG tablet Commonly known as: CYANOCOBALAMIN Take 1 tablet (500 mcg total) by mouth daily. Start taking on: July 06, 2021        Follow-up Information     Lynn Ito, MD. Go on 07/12/2021.   Specialty: Infectious Diseases Why: Appt @ 11:30 am Contact information: [address] [place] [address] [phone]         Felecia Shelling, DPM. Go on 07/12/2021.   Specialty: Podiatry Why: Appt @ 2:30 pm Contact information: [address] [place] [address] [phone]                No Known Allergies  Consultations: Podiatry ID   Procedures/Studies: MR FOOT LEFT W WO CONTRAST  Result Date: 06/30/2021 CLINICAL DATA:  Osteomyelitis. Prior amputation. Gas in the soft tissues on radiography. EXAM: MRI OF THE LEFT FOREFOOT WITHOUT AND WITH CONTRAST TECHNIQUE: Multiplanar, multisequence MR imaging of the left forefoot was performed both before and after administration of intravenous contrast. CONTRAST:  32mL GADAVIST GADOBUTROL 1 MMOL/ML IV SOLN COMPARISON:  Radiographs 06/29/2021 and MRI 01/12/2021 FINDINGS: Bones/Joint/Cartilage Prior amputation of the first ray at the proximal metatarsal. Chronic callus expansion/deformities in the distal second and third metatarsals with fusion of the second MTP joint. 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 There is diffuse abnormal edema in the remaining first metatarsal compatible with active osteomyelitis. Ligaments The Lisfranc ligament is ruptured. Muscles and Tendons Severe muscular atrophy. Soft tissues There is a large medial ulceration in the medial foot shown on image 9 of series 8, extending to a 2.4 by 2.0 cm debris-filled cavity that abuts the distal margin of the remaining first digit metatarsal. Surrounding scattered gas in the soft tissues compatible with infection. Extensive  surrounding cellulitis. IMPRESSION: 1. Active osteomyelitis of the remaining first metatarsal. There is a large adjacent medial ulceration extending to a debris-filled cavity abutting the distal amputation margin of the first metatarsal, along with scattered loculations of gas in the surrounding soft tissues indicating infection, and extensive surrounding cellulitis. 2. 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 Electronically Signed   By: Gaylyn Rong M.D.   On: 06/30/2021 07:40   US Venous Img Lower Bilateral (DVT)  Result Date: 06/29/2021 CLINICAL DATA:  Bilateral lower extremity swelling. EXAM: BILATERAL LOWER EXTREMITY VENOUS DOPPLER ULTRASOUND TECHNIQUE: Gray-scale sonography with graded compression, as well as color Doppler and duplex ultrasound were performed to evaluate the lower extremity deep venous systems from the level of the common femoral vein and including the common femoral, femoral, profunda femoral, popliteal and calf veins including the posterior tibial, peroneal and gastrocnemius veins when visible. The superficial great saphenous vein was also interrogated. Spectral Doppler was utilized to  evaluate flow at rest and with distal augmentation maneuvers in the common femoral, femoral and popliteal veins. COMPARISON:  None. FINDINGS: RIGHT LOWER EXTREMITY Common Femoral Vein: No evidence of thrombus. Normal compressibility, respiratory phasicity and response to augmentation. Saphenofemoral Junction: No evidence of thrombus. Normal compressibility and flow on color Doppler imaging. Profunda Femoral Vein: No evidence of thrombus. Normal compressibility and flow on color Doppler imaging. Femoral Vein: No evidence of thrombus. Normal compressibility, respiratory phasicity and response to augmentation. Popliteal Vein: No evidence of thrombus. Normal compressibility, respiratory phasicity and response to augmentation. Calf Veins: No evidence of thrombus.  Normal compressibility and flow on color Doppler imaging. Superficial Great Saphenous Vein: No evidence of thrombus. Normal compressibility. Venous Reflux:  None. Other Findings:  None. LEFT LOWER EXTREMITY Common Femoral Vein: No evidence of thrombus. Normal compressibility, respiratory phasicity and response to augmentation. Saphenofemoral Junction: No evidence of thrombus. Normal compressibility and flow on color Doppler imaging. Profunda Femoral Vein: No evidence of thrombus. Normal compressibility and flow on color Doppler imaging. Femoral Vein: No evidence of thrombus. Normal compressibility, respiratory phasicity and response to augmentation. Popliteal Vein: No evidence of thrombus. Normal compressibility, respiratory phasicity and response to augmentation. Calf Veins: No evidence of thrombus. Normal compressibility and flow on color Doppler imaging. Superficial Great Saphenous Vein: No evidence of thrombus. Normal compressibility. Venous Reflux:  None. Other Findings:  None. IMPRESSION: No evidence of deep venous thrombosis in either lower extremity. Electronically Signed   By: Aram Candela M.D.   On: 06/29/2021 20:15   DG Foot 2 Views Left  Result Date: 06/29/2021 CLINICAL DATA:  Diabetic ulcer. EXAM: LEFT FOOT - 2 VIEW COMPARISON:  June 29, 2021 (1:55 p.m.) FINDINGS: Transmetatarsal amputation of the left great toe is again seen. Since the prior study there is been interval removal of a cortical fragment noted adjacent to the remainder of the first left metatarsal. A 16 mm x 12 mm area of soft tissue air is now seen within this region. Stable chronic changes are noted along the distal aspects of the second and third left metatarsals. Moderate severity diffuse soft tissue swelling is seen. IMPRESSION: 1. Interval removal of a cortical fragment seen adjacent to the remaining portion of the first left metatarsal on the prior study. 2. Small area of residual soft tissue air is seen within this  region. 3. Diffuse soft tissue swelling Electronically Signed   By: Aram Candela M.D.   On: 06/29/2021 19:52   DG Foot Complete Left  Result Date: 07/01/2021 CLINICAL DATA:  Postop check.  Pain. EXAM: LEFT FOOT - COMPLETE 3+ VIEW COMPARISON:  Left foot radiographs and MRI 06/29/2021 FINDINGS: Sequelae of first ray amputation are again identified with interval resection of the residual proximal first metatarsal. Packing material and skin staples are in place, and there is regional soft tissue swelling and gas. 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 There are chronic deformities of the distal second and third metatarsals with second MTP joint ankylosis. An old fifth toe proximal phalanx fracture is also noted. No acute fracture is identified. IMPRESSION: Interval residual first metatarsal resection. Electronically Signed   By: Sebastian Ache M.D.   On: 07/01/2021 20:03   DG Foot Complete Left  Result Date: 06/29/2021 CLINICAL DATA:  Assess for osteomyelitis. Status post first ray amputation. EXAM: LEFT FOOT - COMPLETE 3+ VIEW COMPARISON:  01/14/21 FINDINGS: There is diffuse soft tissue swelling. Signs of previous first ray amputation at the level of  the mid shaft of the metatarsal bone. Compared with the previous exam there has been progressive fragmentation and demineralization involving the distal aspect of the remaining portions of the first metatarsal bone. Soft tissue ulcer is noted within the overlying soft tissues and there is gas within the subcutaneous soft tissues adjacent to the bone. Ankylosis of the second MTP joint is noted. Heel fracture deformities involving the distal shaft of the third metatarsal bone and fifth proximal phalanx noted. IMPRESSION: 1. Progressive fragmentation and demineralization involving the distal aspect of the first metatarsal bone at the amputation site compatible with osteomyelitis. 2. Soft tissue ulcer  overlying the first metatarsal bone is noted with. gas in the underlying subcutaneous soft tissues adjacent to the bone compatible with soft tissue infection. 3. Ankylosis of the second MTP joint. Electronically Signed   By: Signa Kell M.D.   On: 06/29/2021 14:18   DG Foot Complete Right  Result Date: 06/29/2021 CLINICAL DATA:  Diabetic ulcer. EXAM: RIGHT FOOT COMPLETE - 3+ VIEW COMPARISON:  Jan 14, 2021 FINDINGS: Transmetatarsal amputation of the second right toe is seen with postoperative changes noted along the distal aspect of the third right metatarsal and proximal third right foot. The radiopaque surgical material seen in between the third right metatarsal and remainder of the third right toe on the prior study are no longer present. There is no evidence of an acute fracture or dislocation. Mild degenerative changes are seen along the dorsal aspect of the mid right foot. A large plantar calcaneal spur is present. An area of soft tissue ulceration is seen along the distal tip of the right great toe. There is no definite evidence of associated cortical destruction. IMPRESSION: Soft ulceration along the distal tip of the right great toe without definite evidence of osteomyelitis. MRI correlation is recommended if this remains of clinical concern. Electronically Signed   By: Aram Candela M.D.   On: 06/29/2021 19:44   DG MINI C-ARM IMAGE ONLY  Result Date: 07/01/2021 There is no interpretation for this exam.  This order is for images obtained during a surgical procedure.  Please See "Surgeries" Tab for more information regarding the procedure.   (Echo, Carotid, EGD, Colonoscopy, ERCP)    Subjective: Pt c/o fatigue    Discharge Exam: Vitals:   07/05/21 1140 07/05/21 1542  BP: 119/75 134/63  Pulse: 78 83  Resp: 18 20  Temp: 98.2 F (36.8 C) 97.6 F (36.4 C)  SpO2: 98% 100%   Vitals:   07/05/21 0407 07/05/21 0818 07/05/21 1140 07/05/21 1542  BP: 115/67 115/67 119/75 134/63  Pulse:  85 73 78 83  Resp: Temp: 98.3 F (36.8 C) 98.3 F (36.8 C) 98.2 F (36.8 C) 97.6 F (36.4 C)  TempSrc:      SpO2: 98% 98% 98% 100%  Weight:      Height:        General: Pt is alert, awake, not in acute distress Cardiovascular: S1/S2 +, no rubs, no gallops Respiratory: CTA bilaterally, no wheezing, no rhonchi Abdominal: Soft, NT, ND, bowel sounds + Extremities: no cyanosis    The results of significant diagnostics from this hospitalization (including imaging, microbiology, ancillary and laboratory) are listed below for reference.     Microbiology: Recent Results (from the past 240 hour(s))  Resp Panel by RT-PCR (Flu A&B, Covid) Nasopharyngeal Swab     Status: None   Collection Time: 06/29/21  5:20 PM   Specimen: Nasopharyngeal Swab; Nasopharyngeal(NP) swabs in vial  transport medium  Result Value Ref Range Status   SARS Coronavirus 2 by RT PCR NEGATIVE NEGATIVE Final    Comment: (NOTE) SARS-CoV-2 target nucleic acids are NOT DETECTED.  The SARS-CoV-2 RNA is generally detectable in upper respiratory specimens during the acute phase of infection. The lowest concentration of SARS-CoV-2 viral copies this assay can detect is 138 copies/mL. A negative result does not preclude SARS-Cov-2 infection and should not be used as the sole basis for treatment or other patient management decisions. A negative result may occur with  improper specimen collection/handling, submission of specimen other than nasopharyngeal swab, presence of viral mutation(s) within the areas targeted by this assay, and inadequate number of viral copies(<138 copies/mL). A negative result must be combined with clinical observations, patient history, and epidemiological information. The expected result is Negative.  Fact Sheet for Patients:  BloggerCourse.com  Fact Sheet for Healthcare Providers:  SeriousBroker.it  This test is no t yet approved  or cleared by the [place] FDA and  has been authorized for detection and/or diagnosis of SARS-CoV-2 by FDA under an Emergency Use Authorization (EUA). This EUA will remain  in effect (meaning this test can be used) for the duration of the COVID-19 declaration under Section 564(b)(1) of the Act, 21 U.S.C.section 360bbb-3(b)(1), unless the authorization is terminated  or revoked sooner.       Influenza A by PCR NEGATIVE NEGATIVE Final   Influenza B by PCR NEGATIVE NEGATIVE Final    Comment: (NOTE) The Xpert Xpress SARS-CoV-2/FLU/RSV plus assay is intended as an aid in the diagnosis of influenza from Nasopharyngeal swab specimens and should not be used as a sole basis for treatment. Nasal washings and aspirates are unacceptable for Xpert Xpress SARS-CoV-2/FLU/RSV testing.  Fact Sheet for Patients: BloggerCourse.com  Fact Sheet for Healthcare Providers: SeriousBroker.it  This test is not yet approved or cleared by the [place] FDA and has been authorized for detection and/or diagnosis of SARS-CoV-2 by FDA under an Emergency Use Authorization (EUA). This EUA will remain in effect (meaning this test can be used) for the duration of the COVID-19 declaration under Section 564(b)(1) of the Act, 21 U.S.C. section 360bbb-3(b)(1), unless the authorization is terminated or revoked.  Performed at [facility], [address] [place], [place] [address]   CULTURE, BLOOD (ROUTINE X 2) w Reflex to ID Panel     Status: None   Collection Time: 06/29/21  6:36 PM   Specimen: BLOOD  Result Value Ref Range Status   Specimen Description BLOOD LEFT ANTECUBITAL  Final   Special Requests   Final    BOTTLES DRAWN AEROBIC AND ANAEROBIC Blood Culture adequate volume   Culture   Final    NO GROWTH 5 DAYS Performed at [facility], [address] [place], [place] [address]    Report Status 07/04/2021 FINAL  Final   CULTURE, BLOOD (ROUTINE X 2) w Reflex to ID Panel     Status: None   Collection Time: 06/29/21  6:36 PM   Specimen: BLOOD  Result Value Ref Range Status   Specimen Description BLOOD BLOOD LEFT HAND  Final   Special Requests   Final    BOTTLES DRAWN AEROBIC ONLY Blood Culture results may not be optimal due to an inadequate volume of blood received in culture bottles   Culture   Final    NO GROWTH 5 DAYS Performed at [facility], [address], [place], [place] [address]    Report Status 07/04/2021 FINAL  Final  Aerobic/Anaerobic Culture w Gram Stain (surgical/deep wound)     Status: None   Collection Time: 06/29/21  7:50 PM   Specimen: Wound  Result Value Ref Range Status   Specimen Description   Final    WOUND Performed at [facility], [address] [place], [place] [address]    Special Requests   Final    LEFT FOOT Performed at [facility], [address] [place], [place] [address]    Gram Stain   Final    NO SQUAMOUS EPITHELIAL CELLS SEEN NO WBC SEEN FEW GRAM POSITIVE COCCI    Culture   Final    FEW PROTEUS PENNERI RARE ENTEROBACTER CLOACAE NO ANAEROBES ISOLATED Performed at [facility], [address] [place], [place] [address]    Report Status 07/05/2021 FINAL  Final   Organism ID, Bacteria PROTEUS PENNERI  Final   Organism ID, Bacteria ENTEROBACTER CLOACAE  Final      Susceptibility   Enterobacter cloacae - MIC*    CEFAZOLIN >=64 RESISTANT Resistant     CEFEPIME <=0.12 SENSITIVE Sensitive     CEFTAZIDIME <=1 SENSITIVE Sensitive     CIPROFLOXACIN <=0.25 SENSITIVE Sensitive     GENTAMICIN <=1 SENSITIVE Sensitive     IMIPENEM 0.5 SENSITIVE Sensitive     TRIMETH/SULFA <=20 SENSITIVE Sensitive     PIP/TAZO <=4 SENSITIVE Sensitive     * RARE ENTEROBACTER CLOACAE   Proteus penneri - MIC*    AMPICILLIN >=32 RESISTANT Resistant     CEFAZOLIN >=64 RESISTANT Resistant     CEFEPIME <=0.12 SENSITIVE Sensitive     CEFTAZIDIME  <=1 SENSITIVE Sensitive     CEFTRIAXONE 32 RESISTANT Resistant     CIPROFLOXACIN <=0.25 SENSITIVE Sensitive     GENTAMICIN <=1 SENSITIVE Sensitive     IMIPENEM 2 SENSITIVE Sensitive     TRIMETH/SULFA <=20 SENSITIVE Sensitive     AMPICILLIN/SULBACTAM >=32 RESISTANT Resistant     PIP/TAZO <=4 SENSITIVE Sensitive     * FEW PROTEUS PENNERI  Aerobic/Anaerobic Culture w Gram Stain (surgical/deep wound)     Status: None (Preliminary result)   Collection Time: 07/01/21  5:12 PM   Specimen: PATH Bone biopsy; Tissue  Result Value Ref Range Status   Specimen Description   Final    WOUND BONE Performed at [facility], [address], [place], [place] [address]    Special Requests   Final    NONE Performed at [facility], [address] [place], [place] [address]    Gram Stain   Final    NO SQUAMOUS EPITHELIAL CELLS SEEN FEW WBC SEEN NO ORGANISMS SEEN Performed at [facility], [address], [place], [place] [address]    Culture   Final    RARE STAPHYLOCOCCUS SIMULANS NO ANAEROBES ISOLATED; CULTURE IN PROGRESS FOR 5 DAYS    Report Status PENDING  Incomplete   Organism ID, Bacteria STAPHYLOCOCCUS SIMULANS  Final      Susceptibility   Staphylococcus simulans - MIC*    CIPROFLOXACIN <=0.5 SENSITIVE Sensitive     ERYTHROMYCIN 1 INTERMEDIATE Intermediate     GENTAMICIN <=0.5 SENSITIVE Sensitive     OXACILLIN <=0.25 SENSITIVE Sensitive     TETRACYCLINE <=1 SENSITIVE Sensitive     VANCOMYCIN <=0.5 SENSITIVE Sensitive     TRIMETH/SULFA <=10 SENSITIVE Sensitive     CLINDAMYCIN RESISTANT Resistant     RIFAMPIN <=0.5 SENSITIVE Sensitive     Inducible Clindamycin POSITIVE Resistant     *  RARE STAPHYLOCOCCUS SIMULANS     Labs: BNP (last 3 results) No results for input(s): BNP in the last 8760 hours. Basic Metabolic Panel: Recent Labs  Lab 07/01/21 0544 07/02/21 0327 07/03/21 0314 07/04/21 0436 07/05/21 0629  NA 136 133* 135 138 137  K 4.0 3.8 3.6 3.9  4.1  CL 105 100 104 104 104  CO2 24 25 24 26 25   GLUCOSE 106* 100* 101* 105* 99  BUN 8 7* 6* 10 9  CREATININE 0.80 0.62 0.67 0.80 0.83  CALCIUM 8.8* 8.7* 8.6* 9.2 9.3  MG 2.1 1.9 2.1 2.1 2.1   Liver Function Tests: Recent Labs  Lab 06/29/21 1353  AST 17  ALT 9  ALKPHOS 57  BILITOT 1.1  PROT 7.7  ALBUMIN 3.7   No results for input(s): LIPASE, AMYLASE in the last 168 hours. No results for input(s): AMMONIA in the last 168 hours. CBC: Recent Labs  Lab 06/29/21 1353 06/30/21 0509 07/01/21 0544 07/02/21 0327 07/03/21 0314 07/04/21 0436 07/05/21 0629  WBC 11.5*   < > 4.0 7.1 6.5 6.4 6.3  NEUTROABS 8.7*  --   --   --   --   --   --   HGB 14.6   < > 12.0* 11.6* 11.8* 11.9* 12.2*  HCT 42.4   < > 34.7* 34.2* 34.3* 35.9* 36.7*  MCV 104.4*   < > 105.5* 103.3* 104.9* 105.0* 104.0*  PLT 329   < > 327 359 381 407* 440*   < > = values in this interval not displayed.   Cardiac Enzymes: No results for input(s): CKTOTAL, CKMB, CKMBINDEX, TROPONINI in the last 168 hours. BNP: Invalid input(s): POCBNP CBG: Recent Labs  Lab 07/04/21 1235 07/04/21 2030 07/05/21 0819 07/05/21 1141 07/05/21 1612  GLUCAP 173* 108* 139* 106* 91   D-Dimer No results for input(s): DDIMER in the last 72 hours. Hgb A1c No results for input(s): HGBA1C in the last 72 hours. Lipid Profile No results for input(s): CHOL, HDL, LDLCALC, TRIG, CHOLHDL, LDLDIRECT in the last 72 hours. Thyroid function studies No results for input(s): TSH, T4TOTAL, T3FREE, THYROIDAB in the last 72 hours.  Invalid input(s): FREET3 Anemia work up Recent Labs    07/04/21 0436  VITAMINB12 121*  FOLATE 6.6   Urinalysis    Component Value Date/Time   COLORURINE YELLOW (A) 01/11/2021 1608   APPEARANCEUR CLEAR (A) 01/11/2021 1608   LABSPEC 1.014 01/11/2021 1608   PHURINE 5.0 01/11/2021 1608   GLUCOSEU NEGATIVE 01/11/2021 1608   HGBUR NEGATIVE 01/11/2021 1608   BILIRUBINUR NEGATIVE 01/11/2021 1608   KETONESUR NEGATIVE  01/11/2021 1608   PROTEINUR NEGATIVE 01/11/2021 1608   NITRITE NEGATIVE 01/11/2021 1608   LEUKOCYTESUR NEGATIVE 01/11/2021 1608   Sepsis Labs Invalid input(s): PROCALCITONIN,  WBC,  LACTICIDVEN Microbiology Recent Results (from the past 240 hour(s))  Resp Panel by RT-PCR (Flu A&B, Covid) Nasopharyngeal Swab     Status: None   Collection Time: 06/29/21  5:20 PM   Specimen: Nasopharyngeal Swab; Nasopharyngeal(NP) swabs in vial transport medium  Result Value Ref Range Status   SARS Coronavirus 2 by RT PCR NEGATIVE NEGATIVE Final    Comment: (NOTE) SARS-CoV-2 target nucleic acids are NOT DETECTED.  The SARS-CoV-2 RNA is generally detectable in upper respiratory specimens during the acute phase of infection. The lowest concentration of SARS-CoV-2 viral copies this assay can detect is 138 copies/mL. A negative result does not preclude SARS-Cov-2 infection and should not be used as the sole basis for treatment or other  patient management decisions. A negative result may occur with  improper specimen collection/handling, submission of specimen other than nasopharyngeal swab, presence of viral mutation(s) within the areas targeted by this assay, and inadequate number of viral copies(<138 copies/mL). A negative result must be combined with clinical observations, patient history, and epidemiological information. The expected result is Negative.  Fact Sheet for Patients:  BloggerCourse.com  Fact Sheet for Healthcare Providers:  SeriousBroker.it  This test is no t yet approved or cleared by the [place] FDA and  has been authorized for detection and/or diagnosis of SARS-CoV-2 by FDA under an Emergency Use Authorization (EUA). This EUA will remain  in effect (meaning this test can be used) for the duration of the COVID-19 declaration under Section 564(b)(1) of the Act, 21 U.S.C.section 360bbb-3(b)(1), unless the authorization is  terminated  or revoked sooner.       Influenza A by PCR NEGATIVE NEGATIVE Final   Influenza B by PCR NEGATIVE NEGATIVE Final    Comment: (NOTE) The Xpert Xpress SARS-CoV-2/FLU/RSV plus assay is intended as an aid in the diagnosis of influenza from Nasopharyngeal swab specimens and should not be used as a sole basis for treatment. Nasal washings and aspirates are unacceptable for Xpert Xpress SARS-CoV-2/FLU/RSV testing.  Fact Sheet for Patients: BloggerCourse.com  Fact Sheet for Healthcare Providers: SeriousBroker.it  This test is not yet approved or cleared by the [place] FDA and has been authorized for detection and/or diagnosis of SARS-CoV-2 by FDA under an Emergency Use Authorization (EUA). This EUA will remain in effect (meaning this test can be used) for the duration of the COVID-19 declaration under Section 564(b)(1) of the Act, 21 U.S.C. section 360bbb-3(b)(1), unless the authorization is terminated or revoked.  Performed at [facility], [address] [place], [place] [address]   CULTURE, BLOOD (ROUTINE X 2) w Reflex to ID Panel     Status: None   Collection Time: 06/29/21  6:36 PM   Specimen: BLOOD  Result Value Ref Range Status   Specimen Description BLOOD LEFT ANTECUBITAL  Final   Special Requests   Final    BOTTLES DRAWN AEROBIC AND ANAEROBIC Blood Culture adequate volume   Culture   Final    NO GROWTH 5 DAYS Performed at [facility], [address] [place], [place] [address]    Report Status 07/04/2021 FINAL  Final  CULTURE, BLOOD (ROUTINE X 2) w Reflex to ID Panel     Status: None   Collection Time: 06/29/21  6:36 PM   Specimen: BLOOD  Result Value Ref Range Status   Specimen Description BLOOD BLOOD LEFT HAND  Final   Special Requests   Final    BOTTLES DRAWN AEROBIC ONLY Blood Culture results may not be optimal due to an inadequate volume of blood received in culture bottles    Culture   Final    NO GROWTH 5 DAYS Performed at [facility], [address], [place], [place] [address]    Report Status 07/04/2021 FINAL  Final  Aerobic/Anaerobic Culture w Gram Stain (surgical/deep wound)     Status: None   Collection Time: 06/29/21  7:50 PM   Specimen: Wound  Result Value Ref Range Status   Specimen Description   Final    WOUND Performed at [facility], [address], [place], [place] [address]    Special Requests   Final    LEFT FOOT Performed at [facility], [address] [place], [place] [address]  Gram Stain   Final    NO SQUAMOUS EPITHELIAL CELLS SEEN NO WBC SEEN FEW GRAM POSITIVE COCCI    Culture   Final    FEW PROTEUS PENNERI RARE ENTEROBACTER CLOACAE NO ANAEROBES ISOLATED Performed at [facility], [address], [place], [place] [address]    Report Status 07/05/2021 FINAL  Final   Organism ID, Bacteria PROTEUS PENNERI  Final   Organism ID, Bacteria ENTEROBACTER CLOACAE  Final      Susceptibility   Enterobacter cloacae - MIC*    CEFAZOLIN >=64 RESISTANT Resistant     CEFEPIME <=0.12 SENSITIVE Sensitive     CEFTAZIDIME <=1 SENSITIVE Sensitive     CIPROFLOXACIN <=0.25 SENSITIVE Sensitive     GENTAMICIN <=1 SENSITIVE Sensitive     IMIPENEM 0.5 SENSITIVE Sensitive     TRIMETH/SULFA <=20 SENSITIVE Sensitive     PIP/TAZO <=4 SENSITIVE Sensitive     * RARE ENTEROBACTER CLOACAE   Proteus penneri - MIC*    AMPICILLIN >=32 RESISTANT Resistant     CEFAZOLIN >=64 RESISTANT Resistant     CEFEPIME <=0.12 SENSITIVE Sensitive     CEFTAZIDIME <=1 SENSITIVE Sensitive     CEFTRIAXONE 32 RESISTANT Resistant     CIPROFLOXACIN <=0.25 SENSITIVE Sensitive     GENTAMICIN <=1 SENSITIVE Sensitive     IMIPENEM 2 SENSITIVE Sensitive     TRIMETH/SULFA <=20 SENSITIVE Sensitive     AMPICILLIN/SULBACTAM >=32 RESISTANT Resistant     PIP/TAZO <=4 SENSITIVE Sensitive     * FEW PROTEUS PENNERI  Aerobic/Anaerobic Culture w  Gram Stain (surgical/deep wound)     Status: None (Preliminary result)   Collection Time: 07/01/21  5:12 PM   Specimen: PATH Bone biopsy; Tissue  Result Value Ref Range Status   Specimen Description   Final    WOUND BONE Performed at [facility], [address] [place], [place] [address]    Special Requests   Final    NONE Performed at [facility], [address] [place], [place] [address]    Gram Stain   Final    NO SQUAMOUS EPITHELIAL CELLS SEEN FEW WBC SEEN NO ORGANISMS SEEN Performed at [facility], [address], [place], [place] [address]    Culture   Final    RARE STAPHYLOCOCCUS SIMULANS NO ANAEROBES ISOLATED; CULTURE IN PROGRESS FOR 5 DAYS    Report Status PENDING  Incomplete   Organism ID, Bacteria STAPHYLOCOCCUS SIMULANS  Final      Susceptibility   Staphylococcus simulans - MIC*    CIPROFLOXACIN <=0.5 SENSITIVE Sensitive     ERYTHROMYCIN 1 INTERMEDIATE Intermediate     GENTAMICIN <=0.5 SENSITIVE Sensitive     OXACILLIN <=0.25 SENSITIVE Sensitive     TETRACYCLINE <=1 SENSITIVE Sensitive     VANCOMYCIN <=0.5 SENSITIVE Sensitive     TRIMETH/SULFA <=10 SENSITIVE Sensitive     CLINDAMYCIN RESISTANT Resistant     RIFAMPIN <=0.5 SENSITIVE Sensitive     Inducible Clindamycin POSITIVE Resistant     * RARE STAPHYLOCOCCUS SIMULANS     Time coordinating discharge: Over 30 minutes  SIGNED:   Charise Killian, MD  [facility] 07/05/2021, 4:28 PM Pager   If 7PM-7AM, please contact night-coverage

## 2021-07-06 ENCOUNTER — Other Ambulatory Visit: Payer: Self-pay

## 2021-07-06 LAB — AEROBIC/ANAEROBIC CULTURE W GRAM STAIN (SURGICAL/DEEP WOUND): Gram Stain: NONE SEEN

## 2021-07-06 LAB — SURGICAL PATHOLOGY

## 2021-07-07 ENCOUNTER — Other Ambulatory Visit: Payer: Self-pay

## 2021-07-12 ENCOUNTER — Inpatient Hospital Stay: Payer: Medicaid Other | Admitting: Infectious Diseases

## 2021-07-12 ENCOUNTER — Ambulatory Visit: Payer: Self-pay | Admitting: Podiatry

## 2021-07-26 ENCOUNTER — Inpatient Hospital Stay: Payer: Medicaid Other | Admitting: Infectious Diseases

## 2021-07-31 ENCOUNTER — Emergency Department
Admission: EM | Admit: 2021-07-31 | Discharge: 2021-08-01 | Disposition: A | Discharge status: Home / Self Care | Payer: Medicaid Other | Attending: Emergency Medicine | Admitting: Emergency Medicine

## 2021-07-31 ENCOUNTER — Emergency Department: Payer: Medicaid Other

## 2021-07-31 ENCOUNTER — Encounter: Payer: Self-pay | Admitting: Emergency Medicine

## 2021-07-31 ENCOUNTER — Other Ambulatory Visit: Payer: Self-pay

## 2021-07-31 DIAGNOSIS — E872 Acidosis, unspecified: Secondary | ICD-10-CM | POA: Diagnosis not present

## 2021-07-31 DIAGNOSIS — F17210 Nicotine dependence, cigarettes, uncomplicated: Secondary | ICD-10-CM | POA: Insufficient documentation

## 2021-07-31 DIAGNOSIS — E860 Dehydration: Secondary | ICD-10-CM | POA: Diagnosis not present

## 2021-07-31 DIAGNOSIS — Z20822 Contact with and (suspected) exposure to covid-19: Secondary | ICD-10-CM | POA: Diagnosis not present

## 2021-07-31 DIAGNOSIS — L03116 Cellulitis of left lower limb: Secondary | ICD-10-CM | POA: Insufficient documentation

## 2021-07-31 DIAGNOSIS — M79672 Pain in left foot: Secondary | ICD-10-CM | POA: Diagnosis present

## 2021-07-31 DIAGNOSIS — E119 Type 2 diabetes mellitus without complications: Secondary | ICD-10-CM | POA: Diagnosis not present

## 2021-07-31 LAB — RESP PANEL BY RT-PCR (FLU A&B, COVID) ARPGX2
Influenza A by PCR: NEGATIVE
Influenza B by PCR: NEGATIVE
SARS Coronavirus 2 by RT PCR: NEGATIVE

## 2021-07-31 LAB — CBC WITH DIFFERENTIAL/PLATELET
Abs Immature Granulocytes: 0.02 10*3/uL (ref 0.00–0.07)
Basophils Absolute: 0 10*3/uL (ref 0.0–0.1)
Basophils Relative: 1 %
Eosinophils Absolute: 0.5 10*3/uL (ref 0.0–0.5)
Eosinophils Relative: 9 %
HCT: 45.3 % (ref 39.0–52.0)
Hemoglobin: 15.2 g/dL (ref 13.0–17.0)
Immature Granulocytes: 0 %
Lymphocytes Relative: 29 %
Lymphs Abs: 1.4 10*3/uL (ref 0.7–4.0)
MCH: 36.5 pg — ABNORMAL HIGH (ref 26.0–34.0)
MCHC: 33.6 g/dL (ref 30.0–36.0)
MCV: 108.9 fL — ABNORMAL HIGH (ref 80.0–100.0)
Monocytes Absolute: 0.5 10*3/uL (ref 0.1–1.0)
Monocytes Relative: 10 %
Neutro Abs: 2.5 10*3/uL (ref 1.7–7.7)
Neutrophils Relative %: 51 %
Platelets: 232 10*3/uL (ref 150–400)
RBC: 4.16 MIL/uL — ABNORMAL LOW (ref 4.22–5.81)
RDW: 16.6 % — ABNORMAL HIGH (ref 11.5–15.5)
WBC: 5 10*3/uL (ref 4.0–10.5)
nRBC: 0 % (ref 0.0–0.2)

## 2021-07-31 LAB — COMPREHENSIVE METABOLIC PANEL
ALT: 17 U/L (ref 0–44)
AST: 28 U/L (ref 15–41)
Albumin: 4.1 g/dL (ref 3.5–5.0)
Alkaline Phosphatase: 88 U/L (ref 38–126)
Anion gap: 12 (ref 5–15)
BUN: <5 mg/dL — ABNORMAL LOW (ref 8–23)
CO2: 24 mmol/L (ref 22–32)
Calcium: 9.5 mg/dL (ref 8.9–10.3)
Chloride: 103 mmol/L (ref 98–111)
Creatinine, Ser: 0.63 mg/dL (ref 0.61–1.24)
GFR, Estimated: >60 mL/min (ref >60)
Glucose, Bld: 94 mg/dL (ref 70–99)
Potassium: 3.2 mmol/L — ABNORMAL LOW (ref 3.5–5.1)
Sodium: 139 mmol/L (ref 135–145)
Total Bilirubin: 0.4 mg/dL (ref 0.3–1.2)
Total Protein: 7.6 g/dL (ref 6.5–8.1)

## 2021-07-31 LAB — PROTIME-INR
INR: 0.9 (ref 0.8–1.2)
Prothrombin Time: 11.7 seconds (ref 11.4–15.2)

## 2021-07-31 LAB — LACTIC ACID, PLASMA
Lactic Acid, Venous: 5.4 mmol/L (ref 0.5–1.9)
Lactic Acid, Venous: 3.5 mmol/L (ref 0.5–1.9)

## 2021-07-31 LAB — APTT: aPTT: 26 seconds (ref 24–36)

## 2021-07-31 LAB — PROCALCITONIN: Procalcitonin: <0.1 ng/mL

## 2021-07-31 LAB — SEDIMENTATION RATE: Sed Rate: 1 mm/hr (ref 0–20)

## 2021-07-31 MED ORDER — OXYCODONE-ACETAMINOPHEN 5-325 MG PO TABS
1.0000 | ORAL_TABLET | Freq: Once | ORAL | Status: AC
  Administered 2021-07-31: 19:00:00 1 via ORAL
  Filled 2021-07-31: qty 1

## 2021-07-31 MED ORDER — LACTATED RINGERS IV SOLN: INTRAVENOUS | Status: DC

## 2021-07-31 MED ORDER — POTASSIUM CHLORIDE CRYS ER 20 MEQ PO TBCR
40.0000 meq | EXTENDED_RELEASE_TABLET | Freq: Once | ORAL | Status: AC
  Administered 2021-07-31: 19:00:00 40 meq via ORAL
  Filled 2021-07-31: qty 2

## 2021-07-31 MED ORDER — SODIUM CHLORIDE 0.9 % IV SOLN
2.0000 g | INTRAVENOUS | Status: AC
  Administered 2021-07-31: 20:00:00 2 g via INTRAVENOUS
  Filled 2021-07-31: qty 2

## 2021-07-31 MED ORDER — HYDROCODONE-ACETAMINOPHEN 5-325 MG PO TABS: 2.0000 | ORAL_TABLET | Freq: Four times a day (QID) | ORAL | 0 refills | Status: AC | PRN

## 2021-07-31 MED ORDER — VANCOMYCIN HCL 10 G IV SOLR
2500.0000 mg | Freq: Once | INTRAVENOUS | Status: DC
  Filled 2021-07-31: qty 25

## 2021-07-31 MED ORDER — LACTATED RINGERS IV BOLUS
2000.0000 mL | Freq: Once | INTRAVENOUS | Status: AC
  Administered 2021-07-31: 19:00:00 2000 mL via INTRAVENOUS

## 2021-07-31 NOTE — ED Notes (Signed)
Pt given [place] sandwich tray 

## 2021-07-31 NOTE — ED Notes (Signed)
Called pts brother and brother states he is unable to pick pt up and take him home.

## 2021-07-31 NOTE — ED Notes (Signed)
Rn to bedside. Pt alert and in no distress. Pt removed boot and sock for xray. Pt advised he is hungry hasnt eaten in days and has not had power in 2 months. He is trying to get on disability to help him but has no car and no transportation. Pt missed follow ups for his post op amputated toes.

## 2021-07-31 NOTE — ED Provider Notes (Signed)
[facility] Emergency Department Provider Note  ____________________________________________   Event Date/Time   First MD Initiated Contact with Patient 07/31/21 1804     (approximate)  I have reviewed the triage vital signs and the nursing notes.   HISTORY  Chief Complaint Wound Infection   HPI [name] [name] is a 60 y.o. 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  He denies any subsequent injuries or falls.  No fevers, earache, sore throat, nausea, vomiting, diarrhea, burning with urination, rash or any other acute pain.  States she is still without power although he has been taking his medications.  No other acute concerns at this time.         Past Medical History:  Diagnosis Date   Anemia of chronic disease    Diabetes mellitus without complication ([place])    Tobacco use disorder    Toe gangrene [facility])     Patient Active Problem List   Diagnosis Date Noted   Sepsis ([place]) 06/29/2021   Acute osteomyelitis of metatarsal bone of left foot ([place])    Diabetic infection of left foot ([place])    Diabetic foot ulcer ([place]) 01/11/2021   Tobacco use disorder    Peripheral vascular disease ([place])    Acute osteomyelitis of toe of right foot ([place]) 09/19/2016   Infection, Proteus 09/08/2015   Anemia 09/08/2015   Cocaine abuse ([place]) 09/08/2015   Osteomyelitis ([place]) 09/03/2015   Hypokalemia 07/14/2015   Leukocytosis 07/14/2015   Anemia of chronic disease 07/14/2015   Tobacco abuse 07/14/2015   SIRS (systemic inflammatory response syndrome) ([place]) 07/11/2015   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    Past Surgical History:  Procedure Laterality Date   ACHILLES TENDON SURGERY Right 09/22/2016   Procedure: ACHILLES TENDON LENGTHENING;   Surgeon: Samara Deist, DPM;  Location: [facility] ORS;  Service: Podiatry;  Laterality: Right;   AMPUTATION Right 01/14/2021   Procedure: AMPUTATION RAY;  Surgeon: Criselda Peaches, DPM;  Location: [facility] ORS;  Service: Podiatry;  Laterality: Right;   AMPUTATION Left 07/01/2021   Procedure: AMPUTATION RAY;  Surgeon: Edrick Kins, DPM;  Location: [facility] ORS;  Service: Podiatry;  Laterality: Left;   AMPUTATION TOE Left 07/12/2015   Procedure: AMPUTATION TOE;  Surgeon: Samara Deist, DPM;  Location: [facility] ORS;  Service: Podiatry;  Laterality: Left;   AMPUTATION TOE Right 09/22/2016   Procedure: AMPUTATION TOE;  Surgeon: Samara Deist, DPM;  Location: [facility] ORS;  Service: Podiatry;  Laterality: Right;   BONE BIOPSY Left 07/01/2021   Procedure: BONE BIOPSY;  Surgeon: Edrick Kins, DPM;  Location: [facility] ORS;  Service: Podiatry;  Laterality: Left;   INCISION AND DRAINAGE OF WOUND Bilateral 01/14/2021   Procedure: IRRIGATION AND DEBRIDEMENT WOUND;  Surgeon: Criselda Peaches, DPM;  Location: [facility] ORS;  Service: Podiatry;  Laterality: Bilateral;   IRRIGATION AND DEBRIDEMENT FOOT Left 07/12/2015   Procedure: IRRIGATION AND DEBRIDEMENT FOOT;  Surgeon: Samara Deist, DPM;  Location: [facility] ORS;  Service: Podiatry;  Laterality: Left;   IRRIGATION AND DEBRIDEMENT FOOT Left 09/06/2015   Procedure: IRRIGATION AND DEBRIDEMENT FOOT;  Surgeon: Albertine Patricia, DPM;  Location: [facility] ORS;  Service: Podiatry;  Laterality: Left;    Prior to Admission medications   Medication Sig Start Date End Date Taking? Authorizing Provider  amoxicillin-clavulanate (AUGMENTIN) 875-125 MG tablet Take 1 tablet by mouth every 12 (twelve) hours. 07/05/21 08/05/21 Yes Wyvonnia Dusky, MD  HYDROcodone-acetaminophen (NORCO) 5-325 MG tablet Take 2 tablets by mouth every 6 (six) hours as needed for up to 5 days for severe pain. 07/31/21 08/05/21 Yes Lucrezia Starch, MD  sulfamethoxazole-trimethoprim (BACTRIM DS) 800-160 MG tablet Take 1 tablet by mouth  every 12 (twelve) hours. 07/05/21 08/05/21 Yes Wyvonnia Dusky, MD  nicotine (NICODERM CQ - DOSED IN MG/24 HOURS) 14 mg/24hr patch Place 1 patch (14 mg total) onto the skin daily. Patient not taking: Reported on 01/12/2021 09/08/15   Theodoro Grist, MD  polyethylene glycol (MIRALAX / GLYCOLAX) packet Take 17 g by mouth daily as needed for mild constipation. Patient not taking: Reported on 01/12/2021 09/08/15   Theodoro Grist, MD  vitamin B-12 (CYANOCOBALAMIN) 500 MCG tablet Take 1 tablet (500 mcg total) by mouth once daily. Patient not taking: Reported on 07/31/2021 07/06/21 08/06/21  Wyvonnia Dusky, MD    Allergies Patient has no known allergies.  Family History  Problem Relation Age of Onset   CVA Mother    CVA Brother    Diabetes Mellitus II Father     Social History Social History   Tobacco Use   Smoking status: Every Day    Packs/day: 0.50    Types: Cigarettes   Smokeless tobacco: Never  Substance Use Topics   Alcohol use: Yes    Alcohol/week: 4.0 standard drinks    Types: 4 Cans of beer per week    Comment: daily   Drug use: Yes    Comment: cocaine occasionally    Review of Systems  Review of Systems  Constitutional:  Negative for chills and fever.  HENT:  Negative for sore throat.   Eyes:  Negative for pain.  Respiratory:  Negative for cough and stridor.   Cardiovascular:  Negative for chest pain.  Gastrointestinal:  Negative for vomiting.  Musculoskeletal:  Positive for myalgias (L foot).  Skin:  Negative for rash.  Neurological:  Negative for seizures, loss of consciousness and headaches.  Psychiatric/Behavioral:  Negative for suicidal ideas.   All other systems reviewed and are negative.    ____________________________________________   PHYSICAL EXAM:  VITAL SIGNS: ED Triage Vitals  Enc Vitals Group     BP 07/31/21 1702 (!) 144/86     Pulse Rate 07/31/21 1702 92     Resp 07/31/21 1702 20     Temp 07/31/21 1702 98.1 F (36.7 C)     Temp  Source 07/31/21 1702 Oral     SpO2 07/31/21 1702 97 %     Weight 07/31/21 1703 238 lb (108 kg)     Height 07/31/21 1703 6' 4"  (1.93 m)     Head Circumference --      Peak Flow --      Pain Score 07/31/21 1703 9     Pain Loc --      Pain Edu? --      Excl. in [place]? --    Vitals:   07/31/21 2100 07/31/21 2130  BP: (!) 141/68 (!) 157/81  Pulse: 80 85  Resp: 15 13  Temp:    SpO2: 97% 98%   Physical Exam Vitals and nursing note reviewed.  Constitutional:      General: He is not in acute distress.    Appearance: He is well-developed.  HENT:     Head: Normocephalic and atraumatic.  Eyes:     Conjunctiva/sclera: Conjunctivae  normal.  Cardiovascular:     Rate and Rhythm: Normal rate and regular rhythm.     Heart sounds: No murmur heard. Pulmonary:     Effort: Pulmonary effort is normal. No respiratory distress.     Breath sounds: Normal breath sounds.  Abdominal:     Palpations: Abdomen is soft.     Tenderness: There is no abdominal tenderness.  Musculoskeletal:        General: No swelling.     Cervical back: Neck supple.  Skin:    General: Skin is warm and dry.     Capillary Refill: Capillary refill takes less than 2 seconds.  Neurological:     Mental Status: He is alert.  Psychiatric:        Mood and Affect: Mood normal.    Left foot is edematous and fairly tender throughout.  There are some ulcerative lesions on the second and third toes although the staples are still in place in the medial aspect and these appear relatively clean dry and intact.  I am able to Doppler both posterior tibial and dorsal pedal pulses. ____________________________________________   LABS (all labs ordered are listed, but only abnormal results are displayed)  Labs Reviewed  LACTIC ACID, PLASMA - Abnormal; Notable for the following components:      Result Value   Lactic Acid, Venous 5.4 (*)    All other components within normal limits  LACTIC ACID, PLASMA - Abnormal; Notable for the  following components:   Lactic Acid, Venous 3.5 (*)    All other components within normal limits  COMPREHENSIVE METABOLIC PANEL - Abnormal; Notable for the following components:   Potassium 3.2 (*)    BUN <5 (*)    All other components within normal limits  CBC WITH DIFFERENTIAL/PLATELET - Abnormal; Notable for the following components:   RBC 4.16 (*)    MCV 108.9 (*)    MCH 36.5 (*)    RDW 16.6 (*)    All other components within normal limits  RESP PANEL BY RT-PCR (FLU A&B, COVID) ARPGX2  CULTURE, BLOOD (ROUTINE X 2)  CULTURE, BLOOD (ROUTINE X 2)  PROTIME-INR  APTT  SEDIMENTATION RATE  PROCALCITONIN  URINALYSIS, COMPLETE (UACMP) WITH MICROSCOPIC   ____________________________________________  EKG  ____________________________________________  RADIOLOGY  ED MD interpretation: Plain film of the left foot shows patient is status post amputation of left first toe with large amount of soft tissue swelling but no evidence of osteomyelitis or underlying fracture.  Ultrasound left lower extremity shows no evidence of DVT.  Official radiology report(s): US Venous Img Lower Unilateral Left  Result Date: 07/31/2021 CLINICAL DATA:  edema EXAM: LEFT LOWER EXTREMITY VENOUS DOPPLER ULTRASOUND TECHNIQUE: Gray-scale sonography with compression, as well as color and duplex ultrasound, were performed to evaluate the deep venous system(s) from the level of the common femoral vein through the popliteal and proximal calf veins. COMPARISON:  None. FINDINGS: VENOUS Normal compressibility of the common femoral, superficial femoral, and popliteal veins, as well as the visualized calf veins. Visualized portions of profunda femoral vein and great saphenous vein unremarkable. No filling defects to suggest DVT on grayscale or color Doppler imaging. Doppler waveforms show normal direction of venous flow, normal respiratory plasticity and response to augmentation. Limited views of the contralateral common  femoral vein are unremarkable. OTHER None. Limitations: none IMPRESSION: Negative. Electronically Signed   By: Valentino Saxon M.D.   On: 07/31/2021 21:04   DG Foot Complete Left  Result Date: 07/31/2021 CLINICAL DATA:  Left  foot infection EXAM: LEFT FOOT - COMPLETE 3+ VIEW COMPARISON:  None. FINDINGS: Status post amputation of the left first ray at the proximal metatarsal. There is a large amount of soft tissue swelling with skin staples at the operative site. There is no osteolysis or periosteal reaction. Mild irregularity at the resection margin is likely postoperative. 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 IMPRESSION: Status post amputation of the left first ray with large amount of soft tissue swelling. No radiographic evidence of osteomyelitis. Electronically Signed   By: Ulyses Jarred M.D.   On: 07/31/2021 19:40    ____________________________________________   PROCEDURES  Procedure(s) performed (including Critical Care):  .Suture Removal  Date/Time: 07/31/2021 9:49 PM Performed by: Lucrezia Starch, MD Authorized by: Lucrezia Starch, MD   Consent:    Consent obtained:  Verbal   Consent given by:  Patient   Risks, benefits, and alternatives were discussed: yes     Risks discussed:  Pain and bleeding   Alternatives discussed:  No treatment Universal protocol:    Procedure explained and questions answered to patient or proxy's satisfaction: yes     Patient identity confirmed:  Verbally with patient Location:    Location:  Lower extremity Procedure details:    Wound appearance:  Clean   Number of sutures removed:  3   Number of staples removed:  12 Post-procedure details:    Post-removal:  Band-Aid applied   Procedure completion:  Tolerated well, no immediate complications   ____________________________________________   INITIAL IMPRESSION / ASSESSMENT AND PLAN / ED COURSE      Patient presents with above-stated history exam for assessment of  worsening pain in left foot after recent amputation for osteomyelitis.  On arrival he is afebrile and hemodynamically stable.  On exam he has fairly significant tenderness and edema of the left foot as well as ulceration of some of the toes.  No active bleeding or purulent drainage at this time.  Concern for recurrence of osteomyelitis versus development of possible necrotizing infection and sepsis.  Plain film of the left foot shows patient is status post amputation of left first toe with large amount of soft tissue swelling but no evidence of osteomyelitis or underlying fracture.  Ultrasound left lower extremity shows no evidence of DVT.  CMP remarkable for K of 3.2 without any other significant electrolyte or metabolic derangements.  ESR is 1.  Procalcitonin is undetectable.  INR and PTT are unremarkable.  COVID influenza PCR is negative.  CBC is unremarkable for leukocytosis or acute anemia.  Initial lactic acid fairly elevated at 5.4.  Repeat lactic acid 3.5.  I suspect patient's lactic acidosis secondary to dehydration has reported very little p.o. intake today and yesterday.  At this time I do not believe he is septic or bacteremic and I have a low suspicion for acute osteomyelitis given absence of fever, leukocytosis, undetectable procalcitonin and ESR of 1 with otherwise reassuring x-ray.  Initially pending work-up patient treated fairly aggressively with broad-spectrum biotics was given otherwise very reassuring labs and imaging provement lactic acid which I suspect is work-related dehydration then sepsis I do not believe he requires admission for IV antibiotics and I think he is stable for discharge with continued outpatient Bactrim therapy and close podiatry follow-up.  Staples and sutures removed per procedure note above.  Discussed importance of outpatient follow-up.  Rx written for analgesia.  Discharged in stable condition.  He will follow-up with podiatry.       ____________________________________________   FINAL  CLINICAL IMPRESSION(S) / ED DIAGNOSES  Final diagnoses:  Cellulitis of left lower extremity  Lactic acidosis  Dehydration    Medications  vancomycin (VANCOCIN) 2,500 mg in sodium chloride 0.9 % 500 mL IVPB (has no administration in time range)  lactated ringers infusion (has no administration in time range)  lactated ringers bolus 2,000 mL (0 mLs Intravenous Stopped 07/31/21 2011)  ceFEPIme (MAXIPIME) 2 g in sodium chloride 0.9 % 100 mL IVPB (2 g Intravenous New Bag/Given 07/31/21 2011)  oxyCODONE-acetaminophen (PERCOCET/ROXICET) 5-325 MG per tablet 1 tablet (1 tablet Oral Given 07/31/21 1851)  potassium chloride SA (KLOR-CON M) CR tablet 40 mEq (40 mEq Oral Given 07/31/21 1851)     ED Discharge Orders          Ordered    HYDROcodone-acetaminophen ([place]) 5-325 MG tablet  Every 6 hours PRN        07/31/21 2145             Note:  This document was prepared using Dragon voice recognition software and may include unintentional dictation errors.    Lucrezia Starch, MD 07/31/21 2150

## 2021-07-31 NOTE — Consult Note (Signed)
PHARMACY -  BRIEF ANTIBIOTIC NOTE   Pharmacy has received consult(s) for Vancomycin and Cefepime from an ED provider.  The patient's profile has been reviewed for ht/wt/allergies/indication/available labs.    One time order(s) placed for Cefepime 2gm & Vancomycin 2500mg   Further antibiotics/pharmacy consults should be ordered by admitting physician if indicated.                       Thank you, Blong Busk Rodriguez-Guzman PharmD, BCPS 07/31/2021 6:42 PM

## 2021-07-31 NOTE — ED Triage Notes (Signed)
Pt via EMS from home. Pt c/o L foot, pt had toes amputated on 11/12. Pt did not follow up on 11/15. Pt was suppose to get staples removed but pt didn't have transportation. EMS states the L foot looks infected and has a foul odor. Pt c/o of foot pain that started yesterday denies numbness. Pt is A&Ox4 and NAD  EMS also states that pt would potentially need SW/CM manager consult. Pt has been without power.   Wound has foul-odor, warm to the touch, and skin is black around the wound site,

## 2021-08-01 MED ORDER — SULFAMETHOXAZOLE-TRIMETHOPRIM 800-160 MG PO TABS
1.0000 | ORAL_TABLET | Freq: Two times a day (BID) | ORAL | Status: DC
  Filled 2021-08-01: qty 1

## 2021-08-01 MED ORDER — AMOXICILLIN-POT CLAVULANATE 875-125 MG PO TABS
1.0000 | ORAL_TABLET | Freq: Two times a day (BID) | ORAL | Status: DC
  Filled 2021-08-01: qty 1

## 2021-08-01 NOTE — ED Provider Notes (Signed)
-----------------------------------------   2:47 PM on 08/01/2021 ----------------------------------------- Social work was consulted and unfortunately there is not much more we can offer patient at this time.  Patient currently has Medicaid and additionally has food stamps, [employer]PS was contacted for follow-up.  He additionally has running water and and is in the process of applying for disability through an attorney.  There is no indication for admission to the hospital and we will plan for patient to be discharged home.   Chesley Noon, MD 08/01/21 [phone]

## 2021-08-01 NOTE — ED Notes (Signed)
This RN took over care until a final dispo has been made for this pt.  This RN went to give ABX as ordered and pt states pt states "I just took my own"...medication bottles verified with pt, same dose taken as ordered, see MAR.   NAD noted. Pt denies any further needs at this time.

## 2021-08-01 NOTE — TOC Transition Note (Signed)
Transition of Care [facility]) - CM/SW Discharge Note   Patient Details  Name: [name] [name] MRN: [id number] Date of Birth: [birth year]  [facility] of Care [facility]) CM/SW Contact:  Allayne Butcher, RN Phone Number: 08/01/2021, 4:30 PM   Clinical Narrative:     Patient will discharge home.  Taxi will provide transport from ER.  RNCM encouraged patient to follow up with [facility] as it is close to him home for PCP services.  Also encouraged patient to follow up with Medicaid worker on getting transportation for appointments.     Barriers to Discharge: Financial Resources, Other (must enter comment) (no power)   Patient Goals and CMS Choice Patient states their goals for this hospitalization and ongoing recovery are:: Working on disability      Discharge Placement                       Discharge Plan and Services   Discharge Planning Services: CM Consult, Other - See comment            DME Arranged: N/A DME Agency: NA       HH Arranged: NA HH Agency: NA        Social Determinants of Health (SDOH) Interventions     Readmission Risk Interventions No flowsheet data found.

## 2021-08-01 NOTE — ED Provider Notes (Signed)
-----------------------------------------   12:51 AM on 08/01/2021 -----------------------------------------  Patient was discharged but EMS personnel who came to return him were the same ones who brought him to the ED.  They report that he has no electricity, no heat, and no food, and they are not comfortable taking him back home to what they feel is an unsafe environment.  They spoke with nursing staff who raised the issue with me and requested that we obtain a TOC consult for assistance.  The patient's brother previously had been available by phone but no longer will answer, so the patient does not have a safe place to return tonight.  Given concerns for unsafe discharge, I ordered ED Boarder status including TOC consult.  Also ordered next dose of prescribed antibiotics and placed a diet order.  The hope is that the brother will be reachable by phone in the morning and can provide a safe living environment for the patient.  If not, TOC may need to get involved.  Patient may be discharged as previously planned as soon as the issue can be resolved.   Loleta Rose, MD 08/01/21 [phone]

## 2021-08-01 NOTE — TOC Progression Note (Signed)
Transition of Care [facility]) - Progression Note    Patient Details  Name: [name] [name] MRN: [id number] Date of Birth: [birth year]  [facility]) CM/SW Contact  Allayne Butcher, RN Phone Number: 08/01/2021, 11:20 AM  Clinical Narrative:    APS report has been made.     Expected Discharge Plan: Home/Self Care Barriers to Discharge: Financial Resources, Other (must enter comment) (no power)  Expected Discharge Plan and Services Expected Discharge Plan: Home/Self Care   Discharge Planning Services: CM Consult, Other - See comment   Living arrangements for the past 2 months: Single Family Home                 DME Arranged: N/A DME Agency: NA       HH Arranged: NA HH Agency: NA         Social Determinants of Health (SDOH) Interventions    Readmission Risk Interventions No flowsheet data found.

## 2021-08-01 NOTE — ED Notes (Signed)
Non slip socks place don pt, pt OOB to restroom with steady gait with no assist. Pt denies dizziness or issues with gait at this time. NAD noted at this time.

## 2021-08-01 NOTE — ED Notes (Signed)
D/C and reasons to return to ED discussed with pt, pt verbalized understanding. Pt given [employer] taxi cab. Pt ambulatory with steady gait on D/C. NAD noted. All belongings sent home with pt. IV removed. VSS.

## 2021-08-01 NOTE — TOC Initial Note (Signed)
Transition of Care [facility]) - Initial/Assessment Note    Patient Details  Name: [name] [name] MRN: [id number] Date of Birth: Aug 09, 1961  Transition of Care [facility]) CM/SW Contact:    Allayne Butcher, RN Phone Number: 08/01/2021, 10:32 AM  Clinical Narrative:                 Patient came in the emergency department for foot wound that has foul odor.  Patient had toe amputation in the hospital on 07/03/21 and he was discharged home on 11/8 and instructed to follow up with podiatry outpatient.  Patient never followed up because he did not have any transportation.  Patient reports that he has no power at home, he lives in his father's old house.  He has been without power for 3 months, bill is over $500.  He reports he does have water.  He reports that he does eat, he pan handles for money and goes to the convenient store for snacks.  His brother, Shon Hale lives in [place] and gets his mail.  He reports that he has an attorney and has applied for disability.  He has family planning Medicaid, case worker Virl Cagey- [place] [id number] P.   Patient gives permission for RNCM to reach out to his brother Shon Hale.  Patient did get his medications from [facility] last admission and has been taking his antibiotics, he does not have a PCP.    Spoke with Allena Katz reports that he is really unable to help his brother because he doesn't have any money either and he doesn't even have money enough to get to the gas station right now.  Shon Hale did say that he made sure that the patient has a phone so he can call and take care of things, he gets food stamps, and he should be able to get transportation through the [facility] office.  Shon Hale reports that the patient just won't  do what he needs to do but he has all the resources available.   Shon Hale also reports that the patient abuses alcohol.    RNCM reaching out to APS to make a report to follow up in the community.   Expected Discharge Plan: Home/Self Care Barriers to  Discharge: Financial Resources, Other (must enter comment) (no power)   Patient Goals and CMS Choice Patient states their goals for this hospitalization and ongoing recovery are:: Working on disability      Expected Discharge Plan and Services Expected Discharge Plan: Home/Self Care   Discharge Planning Services: CM Consult, Other - See comment   Living arrangements for the past 2 months: Single Family Home                 DME Arranged: N/A DME Agency: NA       HH Arranged: NA HH Agency: NA        Prior Living Arrangements/Services Living arrangements for the past 2 months: Single Family Home Lives with:: Self Patient language and need for interpreter reviewed:: Yes Do you feel safe going back to the place where you live?: Yes      Need for Family Participation in Patient Care: Yes (Comment) (diabetes, foot ulcers) Care giver support system in place?: No (comment)   Criminal Activity/Legal Involvement Pertinent to Current Situation/Hospitalization: No - Comment as needed  Activities of Daily Living      Permission Sought/Granted Permission sought to share information with : Case Manager, Family Supports Permission granted to share information with : Yes, Verbal Permission Granted  Share  Information with NAME: De Blanch     Permission granted to share info w Relationship: brother  Permission granted to share info w Contact Information: [phone]  Emotional Assessment Appearance:: Appears stated age Attitude/Demeanor/Rapport: Engaged Affect (typically observed): Accepting Orientation: : Oriented to Self, Oriented to Place, Oriented to  Time, Oriented to Situation Alcohol / Substance Use: Not Applicable Psych Involvement: No (comment)  Admission diagnosis:  Infected Foot Patient Active Problem List   Diagnosis Date Noted   Sepsis (HCC) 06/29/2021   Acute osteomyelitis of metatarsal bone of left foot (HCC)    Diabetic infection of left foot (HCC)     Diabetic foot ulcer (HCC) 01/11/2021   Tobacco use disorder    Peripheral vascular disease (HCC)    Acute osteomyelitis of toe of right foot (HCC) 09/19/2016   Infection, Proteus 09/08/2015   Anemia 09/08/2015   Cocaine abuse (HCC) 09/08/2015   Osteomyelitis (HCC) 09/03/2015   Hypokalemia 07/14/2015   Leukocytosis 07/14/2015   Anemia of chronic disease 07/14/2015   Tobacco abuse 07/14/2015   SIRS (systemic inflammatory response syndrome) (HCC) 07/11/2015   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 - [place], [place] - [address] [place] [address] Phone: [phone] Fax: [phone]  [facility] [address], [address] [place] [address] Phone: [phone] Fax: [phone]     Social Determinants of Health (SDOH) Interventions    Readmission Risk Interventions No flowsheet data found.

## 2021-08-05 LAB — CULTURE, BLOOD (ROUTINE X 2): Culture: NO GROWTH

## 2021-08-08 ENCOUNTER — Other Ambulatory Visit: Payer: Self-pay

## 2021-08-10 ENCOUNTER — Other Ambulatory Visit: Payer: Self-pay

## 2021-10-03 ENCOUNTER — Other Ambulatory Visit: Payer: Self-pay

## 2021-11-09 ENCOUNTER — Other Ambulatory Visit: Payer: Self-pay

## 2021-11-22 ENCOUNTER — Encounter: Payer: Self-pay | Admitting: Emergency Medicine

## 2021-11-22 ENCOUNTER — Other Ambulatory Visit: Payer: Self-pay

## 2021-11-22 ENCOUNTER — Emergency Department: Payer: Medicaid Other

## 2021-11-22 ENCOUNTER — Emergency Department
Admission: EM | Admit: 2021-11-22 | Discharge: 2021-11-22 | Disposition: A | Discharge status: Home / Self Care | Payer: Medicaid Other | Attending: Student in an Organized Health Care Education/Training Program | Admitting: Student in an Organized Health Care Education/Training Program

## 2021-11-22 DIAGNOSIS — M25462 Effusion, left knee: Secondary | ICD-10-CM

## 2021-11-22 DIAGNOSIS — W19XXXA Unspecified fall, initial encounter: Secondary | ICD-10-CM | POA: Insufficient documentation

## 2021-11-22 DIAGNOSIS — F17210 Nicotine dependence, cigarettes, uncomplicated: Secondary | ICD-10-CM | POA: Diagnosis not present

## 2021-11-22 DIAGNOSIS — S82042A Displaced comminuted fracture of left patella, initial encounter for closed fracture: Secondary | ICD-10-CM | POA: Diagnosis not present

## 2021-11-22 DIAGNOSIS — E119 Type 2 diabetes mellitus without complications: Secondary | ICD-10-CM | POA: Insufficient documentation

## 2021-11-22 DIAGNOSIS — S8992XA Unspecified injury of left lower leg, initial encounter: Secondary | ICD-10-CM | POA: Diagnosis present

## 2021-11-22 LAB — CBG MONITORING, ED: Glucose-Capillary: 88 mg/dL (ref 70–99)

## 2021-11-22 MED ORDER — TRAMADOL HCL 50 MG PO TABS: 50.0000 mg | ORAL_TABLET | Freq: Four times a day (QID) | ORAL | 0 refills | Status: DC | PRN

## 2021-11-22 MED ORDER — DOXYCYCLINE HYCLATE 100 MG PO CAPS: 100.0000 mg | ORAL_CAPSULE | Freq: Two times a day (BID) | ORAL | 0 refills | Status: DC

## 2021-11-22 NOTE — Discharge Instructions (Addendum)
Please take the antibiotic as prescribed and until finished ? ?Call and schedule a follow up with orthopedics. ? ?Wear your knee brace at all times except for showering.  ?Use your walker when up moving around. ?

## 2021-11-22 NOTE — ED Notes (Signed)
See triage note  presents s/p fall   states slipped on concrete  injury to left knee  unable to bear wt ?

## 2021-11-22 NOTE — ED Provider Notes (Signed)
[facility] ?Emergency Department Provider Note ?____________________________________________ ? ?Time seen: Approximately 3:47 PM ? ?I have reviewed the triage vital signs and the nursing notes. ? ? ?HISTORY ? ?Chief Complaint ?Fall and Knee Pain ? ? ? ?HPI ?DSHAWN MCNAY is a 61 y.o. male with history of anemia; osteomyelitis; diabetes; cocaine abuse who presents to the emergency department for evaluation and treatment of left knee pain after fall yesterday. He states that he falls frequently due to imbalance after great toe amputation, but denies previous knee injury. He denies striking his head or loss of consciousness. No other injury from this fall.  ? ?Past Medical History:  ?Diagnosis Date  ? Anemia of chronic disease   ? Diabetes mellitus without complication (HCC)   ? Tobacco use disorder   ? Toe gangrene (HCC)   ? ? ?Patient Active Problem List  ? Diagnosis Date Noted  ? Sepsis (HCC) 06/29/2021  ? Acute osteomyelitis of metatarsal bone of left foot (HCC)   ? Diabetic infection of left foot (HCC)   ? Diabetic foot ulcer (HCC) 01/11/2021  ? Tobacco use disorder   ? Peripheral vascular disease (HCC)   ? Acute osteomyelitis of toe of right foot (HCC) 09/19/2016  ? Infection, Proteus 09/08/2015  ? Anemia 09/08/2015  ? Cocaine abuse (HCC) 09/08/2015  ? Osteomyelitis (HCC) 09/03/2015  ? Hypokalemia 07/14/2015  ? Leukocytosis 07/14/2015  ? Anemia of chronic disease 07/14/2015  ? Tobacco abuse 07/14/2015  ? SIRS (systemic inflammatory response syndrome) (HCC) 07/11/2015  ? 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 ? ?Past Surgical History:  ?Procedure Laterality Date  ? ACHILLES TENDON SURGERY Right 09/22/2016  ? Procedure: ACHILLES TENDON LENGTHENING;  Surgeon: Gwyneth Revels, DPM;  Location: [facility] OR[facility];  Service: Podiatry;  Laterality: Right;  ? AMPUTATION Right 01/14/2021  ? Procedure: AMPUTATION RAY;  Surgeon: Edwin Cap, DPM;  Location: [facility];  Service: Podiatry;  Laterality: Right;  ?  AMPUTATION Left 07/01/2021  ? Procedure: AMPUTATION RAY;  Surgeon: Felecia Shelling, DPM;  Location: [facility];  Service: Podiatry;  Laterality: Left;  ? AMPUTATION TOE Left 07/12/2015  ? Procedure: AMPUTATION TOE;  Surgeon: Gwyneth Revels, DPM;  Location: [facility];  Service: Podiatry;  Laterality: Left;  ? AMPUTATION TOE Right 09/22/2016  ? Procedure: AMPUTATION TOE;  Surgeon: Gwyneth Revels, DPM;  Location: [facility];  Service: Podiatry;  Laterality: Right;  ? BONE BIOPSY Left 07/01/2021  ? Procedure: BONE BIOPSY;  Surgeon: Felecia Shelling, DPM;  Location: [facility];  Service: Podiatry;  Laterality: Left;  ? INCISION AND DRAINAGE OF WOUND Bilateral 01/14/2021  ? Procedure: IRRIGATION AND DEBRIDEMENT WOUND;  Surgeon: Edwin Cap, DPM;  Location: [facility];  Service: Podiatry;  Laterality: Bilateral;  ? IRRIGATION AND DEBRIDEMENT FOOT Left 07/12/2015  ? Procedure: IRRIGATION AND DEBRIDEMENT FOOT;  Surgeon: Gwyneth Revels, DPM;  Location: [facility];  Service: Podiatry;  Laterality: Left;  ? IRRIGATION AND DEBRIDEMENT FOOT Left 09/06/2015  ? Procedure: IRRIGATION AND DEBRIDEMENT FOOT;  Surgeon: Recardo Evangelist, DPM;  Location: [facility];  Service: Podiatry;  Laterality: Left;  ? ? ?Prior to Admission medications   ?Medication Sig Start Date End Date Taking? Authorizing Provider  ?doxycycline (VIBRAMYCIN) 100 MG capsule Take 1 capsule (100 mg total) by mouth 2 (two) times daily. 11/22/21  Yes Neida Ellegood B, FNP  ?traMADol (ULTRAM) 50 MG tablet Take 1 tablet (50 mg total) by mouth every 6 (six) hours as needed. 11/22/21  Yes Lindey Renzulli B, FNP  ?nicotine (  NICODERM CQ - DOSED IN MG/24 HOURS) 14 mg/24hr patch Place 1 patch (14 mg total) onto the skin daily. ?Patient not taking: Reported on 01/12/2021 09/08/15   Katharina CaperVaickute, Rima, MD  ?polyethylene glycol (MIRALAX / GLYCOLAX) packet Take 17 g by mouth daily as needed for mild constipation. ?Patient not taking: Reported on 01/12/2021 09/08/15   Katharina CaperVaickute, Rima, MD  ? ? ?Allergies ?Patient  has no known allergies. ? ?Family History  ?Problem Relation Age of Onset  ? CVA Mother   ? CVA Brother   ? Diabetes Mellitus II Father   ? ? ?Social History ?Social History  ? ?Tobacco Use  ? Smoking status: Every Day  ?  Packs/day: 0.50  ?  Types: Cigarettes  ? Smokeless tobacco: Never  ?Substance Use Topics  ? Alcohol use: Yes  ?  Alcohol/week: 4.0 standard drinks  ?  Types: 4 Cans of beer per week  ?  Comment: daily  ? Drug use: Yes  ?  Comment: cocaine occasionally  ? ?____________________________________________ ? ? ?PHYSICAL EXAM: ? ?VITAL SIGNS: ?ED Triage Vitals  ?Enc Vitals Group  ?   BP 11/22/21 1404 120/86  ?   Pulse Rate 11/22/21 1404 97  ?   Resp 11/22/21 1404 20  ?   Temp 11/22/21 1404 98.1 ?F (36.7 ?C)  ?   Temp Source 11/22/21 1404 Oral  ?   SpO2 11/22/21 1404 98 %  ?   Weight 11/22/21 1402 230 lb (104.3 kg)  ?   Height 11/22/21 1402 6\' 4"  (1.93 m)  ?   Head Circumference --   ?   Peak Flow --   ?   Pain Score 11/22/21 1402 8  ?   Pain Loc --   ?   Pain Edu? --   ?   Excl. in GC? --   ? ? ?Constitutional: Alert and oriented. Well appearing and in no acute distress. ?Eyes: Conjunctivae are clear without discharge or drainage ?Head: Atraumatic ?Neck: Supple. No focal midline tenderness. ?Respiratory: No cough. Respirations are even and unlabored. ?Musculoskeletal: Left knee diffusely swollen with limited ROM due to pain ?Neurologic: Motor and sensory function is intact.  ?Skin: Superficial abrasion to the prepatellar surface of the left knee  ?Psychiatric: Affect and behavior are appropriate. ? ?____________________________________________ ?  ?LABS ?(all labs ordered are listed, but only abnormal results are displayed) ? ?Labs Reviewed  ?CBG MONITORING, ED  ? ?____________________________________________ ? ?RADIOLOGY ? ?No  ? ?I, Bryant Saye, personally viewed and evaluated these images (plain radiographs) as part of my medical decision making, as well as reviewing the written report by the  radiologist. ? ?CT Knee Left Wo Contrast ? ?Result Date: 11/22/2021 ?CLINICAL DATA:  Fracture, knee EXAM: CT OF THE LEFT KNEE WITHOUT CONTRAST TECHNIQUE: Multidetector CT imaging of the left knee was performed according to the standard protocol. Multiplanar CT image reconstructions were also generated. RADIATION DOSE REDUCTION: This exam was performed according to the departmental dose-optimization program which includes automated exposure control, adjustment of the mA and/or kV according to patient size and/or use of iterative reconstruction technique. COMPARISON:  Radiograph 11/22/2021 FINDINGS: Bones/Joint/Cartilage There is an acute vertical oblique fracture through the patella with mild displacement and comminution. Large lipohemarthrosis. Tricompartment osteoarthritis, severe in the medial compartment with articular surface irregularity, depression, and evidence of chronic medial tibial plateau injury. Moderate lateral compartment joint space narrowing. There is bone on bone articulation along the inner lateral femoral condyle with the lateral tibial spine. Ligaments Suboptimally assessed by  CT. Muscles and Tendons Mild generalized muscle atrophy.  No intramuscular collection. Soft tissues There is soft tissue swelling along the knee. IMPRESSION: Mildly displaced and comminuted vertical oblique fracture of the patella with large lipohemarthrosis. Tricompartment osteoarthritis, severe in the medial compartment with articular surface depression and irregularity. Probable chronic medial tibial plateau injury. Electronically Signed   By: Caprice Renshaw M.D.   On: 11/22/2021 16:03  ? ?DG Knee Complete 4 Views Left ? ?Result Date: 11/22/2021 ?CLINICAL DATA:  Trauma, pain EXAM: LEFT KNEE - COMPLETE 4+ VIEW COMPARISON:  None. FINDINGS: There is large effusion in the suprapatellar bursa. No definite recent fracture or dislocation is seen. Degenerative changes are noted with bony spurs in medial, lateral and patellofemoral  compartments, more so in the medial compartment. There is deformity of medial tibial plateau without radiolucent line suggesting previous injury. Arterial calcifications are seen in the soft tissues. IMPRESSION: No re

## 2021-11-22 NOTE — ED Triage Notes (Signed)
Pt via POV from home. Pt slipped and landed on his L knee on concrete. Pt states he is unable to bear weight. Pt is A&OX4 and NAD.  ?

## 2021-11-27 ENCOUNTER — Emergency Department: Payer: Medicaid Other

## 2021-11-27 ENCOUNTER — Other Ambulatory Visit: Payer: Self-pay

## 2021-11-27 DIAGNOSIS — E871 Hypo-osmolality and hyponatremia: Secondary | ICD-10-CM | POA: Diagnosis not present

## 2021-11-27 DIAGNOSIS — R55 Syncope and collapse: Secondary | ICD-10-CM | POA: Diagnosis not present

## 2021-11-27 DIAGNOSIS — F10129 Alcohol abuse with intoxication, unspecified: Secondary | ICD-10-CM | POA: Diagnosis not present

## 2021-11-27 DIAGNOSIS — F17200 Nicotine dependence, unspecified, uncomplicated: Secondary | ICD-10-CM | POA: Diagnosis not present

## 2021-11-27 DIAGNOSIS — E119 Type 2 diabetes mellitus without complications: Secondary | ICD-10-CM | POA: Diagnosis not present

## 2021-11-27 DIAGNOSIS — R0602 Shortness of breath: Secondary | ICD-10-CM | POA: Diagnosis present

## 2021-11-27 DIAGNOSIS — R748 Abnormal levels of other serum enzymes: Secondary | ICD-10-CM | POA: Insufficient documentation

## 2021-11-27 DIAGNOSIS — E876 Hypokalemia: Secondary | ICD-10-CM | POA: Insufficient documentation

## 2021-11-27 DIAGNOSIS — R0789 Other chest pain: Secondary | ICD-10-CM | POA: Insufficient documentation

## 2021-11-27 DIAGNOSIS — Y908 Blood alcohol level of 240 mg/100 ml or more: Secondary | ICD-10-CM | POA: Insufficient documentation

## 2021-11-27 LAB — CBC
HCT: 41.2 % (ref 39.0–52.0)
Hemoglobin: 13.6 g/dL (ref 13.0–17.0)
MCH: 34 pg (ref 26.0–34.0)
MCHC: 33 g/dL (ref 30.0–36.0)
MCV: 103 fL — ABNORMAL HIGH (ref 80.0–100.0)
Platelets: 198 10*3/uL (ref 150–400)
RBC: 4 MIL/uL — ABNORMAL LOW (ref 4.22–5.81)
RDW: 11.9 % (ref 11.5–15.5)
WBC: 6.9 10*3/uL (ref 4.0–10.5)
nRBC: 0 % (ref 0.0–0.2)

## 2021-11-27 NOTE — ED Notes (Signed)
First rn note: per ems pt with etoh ingestion, pt fell off ac unit at gas station, per ems pt is not able to stand on his own at this time. Blood pressure 108/67 per ems, hr 78. Bs 99.  ?

## 2021-11-27 NOTE — ED Triage Notes (Signed)
Pt states he was sitting outside on the HVAC unit at the gas station earlier today when he blacked out and fell. EMS reports they found pt wedged between HVAC unit and wall of the gas station. Pt reports he was having chest pain and sob prior to fall. Denies any injuries from fall. Admits to ETOH use today, 4-5 beers. No obvious injuries noted.  ?

## 2021-11-27 NOTE — ED Triage Notes (Signed)
Pt also c/o being consistently incontinent of urine and feeling like he may be having " kidney issues." Denies related urinary sx.  ?

## 2021-11-28 ENCOUNTER — Emergency Department
Admission: EM | Admit: 2021-11-28 | Discharge: 2021-11-28 | Disposition: A | Discharge status: Home / Self Care | Payer: Medicaid Other | Attending: Emergency Medicine | Admitting: Emergency Medicine

## 2021-11-28 ENCOUNTER — Emergency Department: Payer: Medicaid Other

## 2021-11-28 DIAGNOSIS — F10920 Alcohol use, unspecified with intoxication, uncomplicated: Secondary | ICD-10-CM

## 2021-11-28 DIAGNOSIS — E876 Hypokalemia: Secondary | ICD-10-CM

## 2021-11-28 DIAGNOSIS — R55 Syncope and collapse: Secondary | ICD-10-CM

## 2021-11-28 DIAGNOSIS — W19XXXA Unspecified fall, initial encounter: Secondary | ICD-10-CM

## 2021-11-28 DIAGNOSIS — E871 Hypo-osmolality and hyponatremia: Secondary | ICD-10-CM

## 2021-11-28 LAB — COMPREHENSIVE METABOLIC PANEL
ALT: 40 U/L (ref 0–44)
AST: 39 U/L (ref 15–41)
Albumin: 4.1 g/dL (ref 3.5–5.0)
Alkaline Phosphatase: 72 U/L (ref 38–126)
Anion gap: 17 — ABNORMAL HIGH (ref 5–15)
BUN: 9 mg/dL (ref 8–23)
CO2: 20 mmol/L — ABNORMAL LOW (ref 22–32)
Calcium: 9.3 mg/dL (ref 8.9–10.3)
Chloride: 96 mmol/L — ABNORMAL LOW (ref 98–111)
Creatinine, Ser: 0.84 mg/dL (ref 0.61–1.24)
GFR, Estimated: >60 mL/min (ref >60)
Glucose, Bld: 116 mg/dL — ABNORMAL HIGH (ref 70–99)
Potassium: 3 mmol/L — ABNORMAL LOW (ref 3.5–5.1)
Sodium: 133 mmol/L — ABNORMAL LOW (ref 135–145)
Total Bilirubin: 0.7 mg/dL (ref 0.3–1.2)
Total Protein: 7.8 g/dL (ref 6.5–8.1)

## 2021-11-28 LAB — TROPONIN I (HIGH SENSITIVITY)
Troponin I (High Sensitivity): 7 ng/L (ref <18)
Troponin I (High Sensitivity): 8 ng/L (ref <18)

## 2021-11-28 LAB — LIPASE, BLOOD: Lipase: 56 U/L — ABNORMAL HIGH (ref 11–51)

## 2021-11-28 LAB — ETHANOL: Alcohol, Ethyl (B): 270 mg/dL — ABNORMAL HIGH (ref <10)

## 2021-11-28 MED ORDER — POTASSIUM CHLORIDE CRYS ER 20 MEQ PO TBCR
40.0000 meq | EXTENDED_RELEASE_TABLET | Freq: Once | ORAL | Status: AC
  Administered 2021-11-28: 40 meq via ORAL
  Filled 2021-11-28: qty 2

## 2021-11-28 MED ORDER — SODIUM CHLORIDE 0.9 % IV BOLUS
1000.0000 mL | Freq: Once | INTRAVENOUS | Status: AC
  Administered 2021-11-28: 1000 mL via INTRAVENOUS

## 2021-11-28 MED ORDER — LORAZEPAM 2 MG/ML IJ SOLN: 0.0000 mg | Freq: Four times a day (QID) | INTRAMUSCULAR | Status: DC

## 2021-11-28 MED ORDER — THIAMINE HCL 100 MG PO TABS: 100.0000 mg | ORAL_TABLET | Freq: Every day | ORAL | Status: DC

## 2021-11-28 MED ORDER — THIAMINE HCL 100 MG/ML IJ SOLN
Freq: Once | INTRAVENOUS | Status: AC
  Filled 2021-11-28: qty 1000

## 2021-11-28 MED ORDER — SODIUM CHLORIDE 0.9 % IV BOLUS: 1000.0000 mL | Freq: Once | INTRAVENOUS | Status: DC

## 2021-11-28 NOTE — ED Provider Notes (Signed)
? ?[facility] ?Provider Note ? ? ? Event Date/Time  ? First MD Initiated Contact with Patient 11/28/21 0320   ?  (approximate) ? ? ?History  ? ?Fall ? ? ?HPI ? ?[name] [name] is a 61 y.o. male [place] with a chief complaint of EtOH use, probable syncope, chest pain and shortness of breath.  Reportedly patient was sitting on the HVAC unit at the gas station drinking beers when he blacked out and fell.  EMS reports they found patient wedged between the HVAC unit and the wall of the gas station.  States patient reported to them that he was experiencing chest pain and shortness of breath prior to the fall.  Patient denies this to me.  Denies recent fever, cough, abdominal pain, nausea, vomiting or dizziness. ?  ? ? ?Past Medical History  ? ?Past Medical History:  ?Diagnosis Date  ?? Anemia of chronic disease   ?? Diabetes mellitus without complication (HCC)   ?? Tobacco use disorder   ?? Toe gangrene (HCC)   ? ? ? ?Active Problem List  ? ?Patient Active Problem List  ? Diagnosis Date Noted  ?? Sepsis (HCC) 06/29/2021  ?? Acute osteomyelitis of metatarsal bone of left foot (HCC)   ?? Diabetic infection of left foot (HCC)   ?? Diabetic foot ulcer (HCC) 01/11/2021  ?? Tobacco use disorder   ?? Peripheral vascular disease (HCC)   ?? Acute osteomyelitis of toe of right foot (HCC) 09/19/2016  ?? Infection, Proteus 09/08/2015  ?? Anemia 09/08/2015  ?? Cocaine abuse (HCC) 09/08/2015  ?? Osteomyelitis (HCC) 09/03/2015  ?? Hypokalemia 07/14/2015  ?? Leukocytosis 07/14/2015  ?? Anemia of chronic disease 07/14/2015  ?? Tobacco abuse 07/14/2015  ?? SIRS (systemic inflammatory response syndrome) (HCC) 07/11/2015  ?? 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 ? ? ?Past Surgical History  ? ?Past Surgical History:  ?Procedure Laterality Date  ?? ACHILLES TENDON SURGERY Right 09/22/2016  ? Procedure: ACHILLES TENDON LENGTHENING;  Surgeon: Gwyneth RevelsJustin Fowler, DPM;  Location: [facility];  Service:  Podiatry;  Laterality: Right;  ?? AMPUTATION Right 01/14/2021  ? Procedure: AMPUTATION RAY;  Surgeon: Edwin CapMcDonald, Adam R, DPM;  Location: [facility];  Service: Podiatry;  Laterality: Right;  ?? AMPUTATION Left 07/01/2021  ? Procedure: AMPUTATION RAY;  Surgeon: Felecia ShellingEvans, Brent M, DPM;  Location: [facility];  Service: Podiatry;  Laterality: Left;  ?? AMPUTATION TOE Left 07/12/2015  ? Procedure: AMPUTATION TOE;  Surgeon: Gwyneth RevelsJustin Fowler, DPM;  Location: [facility];  Service: Podiatry;  Laterality: Left;  ?? AMPUTATION TOE Right 09/22/2016  ? Procedure: AMPUTATION TOE;  Surgeon: Gwyneth RevelsJustin Fowler, DPM;  Location: [facility];  Service: Podiatry;  Laterality: Right;  ?? BONE BIOPSY Left 07/01/2021  ? Procedure: BONE BIOPSY;  Surgeon: Felecia ShellingEvans, Brent M, DPM;  Location: [facility];  Service: Podiatry;  Laterality: Left;  ?? INCISION AND DRAINAGE OF WOUND Bilateral 01/14/2021  ? Procedure: IRRIGATION AND DEBRIDEMENT WOUND;  Surgeon: Edwin CapMcDonald, Adam R, DPM;  Location: [facility];  Service: Podiatry;  Laterality: Bilateral;  ?? IRRIGATION AND DEBRIDEMENT FOOT Left 07/12/2015  ? Procedure: IRRIGATION AND DEBRIDEMENT FOOT;  Surgeon: Gwyneth RevelsJustin Fowler, DPM;  Location: [facility];  Service: Podiatry;  Laterality: Left;  ?? IRRIGATION AND DEBRIDEMENT FOOT Left 09/06/2015  ? Procedure: IRRIGATION AND DEBRIDEMENT FOOT;  Surgeon: Recardo EvangelistMatthew Troxler, DPM;  Location: [facility];  Service: Podiatry;  Laterality: Left;  ? ? ? ?Home Medications  ? ?Prior to Admission medications   ?Medication  Sig Start Date End Date Taking? Authorizing Provider  ?doxycycline (VIBRAMYCIN) 100 MG capsule Take 1 capsule (100 mg total) by mouth 2 (two) times daily. 11/22/21   Triplett, Cari B, FNP  ?nicotine (NICODERM CQ - DOSED IN MG/24 HOURS) 14 mg/24hr patch Place 1 patch (14 mg total) onto the skin daily. ?Patient not taking: Reported on 01/12/2021 09/08/15   Katharina Caper, MD  ?polyethylene glycol (MIRALAX / GLYCOLAX) packet Take 17 g by mouth daily as needed for mild constipation. ?Patient not  taking: Reported on 01/12/2021 09/08/15   Katharina Caper, MD  ?traMADol (ULTRAM) 50 MG tablet Take 1 tablet (50 mg total) by mouth every 6 (six) hours as needed. 11/22/21   Chinita Pester, FNP  ? ? ? ?Allergies  ?Patient has no known allergies. ? ? ?Family History  ? ?Family History  ?Problem Relation Age of Onset  ?? CVA Mother   ?? CVA Brother   ?? Diabetes Mellitus II Father   ? ? ? ?Physical Exam  ?Triage Vital Signs: ?ED Triage Vitals  ?Enc Vitals Group  ?   BP 11/27/21 2326 109/68  ?   Pulse Rate 11/27/21 2326 96  ?   Resp 11/27/21 2326 16  ?   Temp 11/27/21 2326 97.9 ?F (36.6 ?C)  ?   Temp Source 11/27/21 2326 Oral  ?   SpO2 11/27/21 2326 96 %  ?   Weight 11/27/21 2334 230 lb (104.3 kg)  ?   Height 11/27/21 2334 6\' 4"  (1.93 m)  ?   Head Circumference --   ?   Peak Flow --   ?   Pain Score 11/27/21 2334 0  ?   Pain Loc --   ?   Pain Edu? --   ?   Excl. in GC? --   ? ? ?Updated Vital Signs: ?BP 137/78   Pulse 95   Temp 97.9 ?F (36.6 ?C) (Oral)   Resp 18   Ht 6\' 4"  (1.93 m)   Wt 104.3 kg   SpO2 94%   BMI 28.00 kg/m?  ? ? ?General: Awake, no distress.  ?CV:  RRR.  Good peripheral perfusion.  ?Resp:  Normal effort.  CTA B. ?Abd:  Nontender to light or deep palpation.  No bruits.  No distention.  ?Other:  Alert and oriented x3.  CN II toXII grossly intact. 5/5 motor strength and sensation all extremities. MAEx4. ? ? ?ED Results / Procedures / Treatments  ?Labs ?(all labs ordered are listed, but only abnormal results are displayed) ?Labs Reviewed  ?CBC - Abnormal; Notable for the following components:  ?    Result Value  ? RBC 4.00 (*)   ? MCV 103.0 (*)   ? All other components within normal limits  ?COMPREHENSIVE METABOLIC PANEL - Abnormal; Notable for the following components:  ? Sodium 133 (*)   ? Potassium 3.0 (*)   ? Chloride 96 (*)   ? CO2 20 (*)   ? Glucose, Bld 116 (*)   ? Anion gap 17 (*)   ? All other components within normal limits  ?ETHANOL - Abnormal; Notable for the following components:  ?  Alcohol, Ethyl (B) 270 (*)   ? All other components within normal limits  ?LIPASE, BLOOD - Abnormal; Notable for the following components:  ? Lipase 56 (*)   ? All other components within normal limits  ?TROPONIN I (HIGH SENSITIVITY)  ?TROPONIN I (HIGH SENSITIVITY)  ? ? ? ?EKG ? ?ED ECG REPORT ?I, Tameaka Eichhorn  J, the attending physician, personally viewed and interpreted this ECG. ? ? Date: 11/28/2021 ? EKG Time: 2333 ? Rate: 102 ? Rhythm: sinus tachycardia ? Axis: Normal ? Intervals:none ? ST&T Change: Nonspecific ? ? ? ?RADIOLOGY ?I have independently visualized and reviewed patient's CT head, chest x-ray as well as noted the radiology interpretation: ? ?CT head: No ICH ? ?Chest x-ray: No acute cardiopulmonary process ? ?Official radiology report(s): ?DG Chest 2 View ? ?Result Date: 11/28/2021 ?CLINICAL DATA:  Chest pain EXAM: CHEST - 2 VIEW COMPARISON:  01/11/2021 FINDINGS: Mild left basilar atelectasis. Lungs are otherwise clear. No pneumothorax or pleural effusion. Cardiac size within normal limits. Pulmonary vascularity is normal. No acute bone abnormality. IMPRESSION: No active cardiopulmonary disease. Electronically Signed   By: Helyn Numbers M.D.   On: 11/28/2021 00:13  ? ?CT Head Wo Contrast ? ?Result Date: 11/28/2021 ?CLINICAL DATA:  Mental status change, unknown cause EtOH.  Fall. EXAM: CT HEAD WITHOUT CONTRAST TECHNIQUE: Contiguous axial images were obtained from the base of the skull through the vertex without intravenous contrast. RADIATION DOSE REDUCTION: This exam was performed according to the departmental dose-optimization program which includes automated exposure control, adjustment of the mA and/or kV according to patient size and/or use of iterative reconstruction technique. COMPARISON:  None. FINDINGS: Brain: No acute intracranial abnormality. Specifically, no hemorrhage, hydrocephalus, mass lesion, acute infarction, or significant intracranial injury. 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  Vascular: No hyperdense vessel or unexpected calcification. Skull: No acute calvarial abnormality. Sinuses/Orbits: No acute findings Other: None IMPRESSION: No acute intracranial abnormality. Electronically Signed   By: Caryn Bee

## 2021-11-28 NOTE — ED Notes (Signed)
Pt attempting to contact friends and family for ride  ?

## 2021-11-28 NOTE — Discharge Instructions (Signed)
Drink alcohol only in moderation.  Return to the ER for worsening symptoms, persistent vomiting, difficulty breathing or other concerns. 

## 2021-11-28 NOTE — ED Provider Notes (Signed)
----------------------------------------- ?  7:05 AM on 11/28/2021 ?----------------------------------------- ? ?Blood pressure 131/80, pulse (!) 102, temperature 97.9 ?F (36.6 ?C), temperature source Oral, resp. rate 17, height 6\' 4"  (1.93 m), weight 104.3 kg, SpO2 97 %. ? ?Assuming care from Dr. .  In short, [name] [name] is a 61 y.o. male with a chief complaint of Fall ?62  Refer to the original H&P for additional details. ? ?The current plan of care is to discharge home once clinically sober. ? ?----------------------------------------- ?8:30 AM on 11/28/2021 ?----------------------------------------- ?Patient is now awake and alert, answering questions appropriately and appears clinically sober.  He is appropriate for discharge home with outpatient follow-up. ? ?  ?01/28/2022, MD ?11/28/21 0830 ? ?

## 2021-11-28 NOTE — ED Notes (Signed)
This RN introduced self to pt. Pt reports yesterday he had a few beers and was at the store and fell over an air condition unit. Pt reports bilateral knee pain from fall  ?

## 2021-11-28 NOTE — ED Notes (Signed)
Pt ambulatory outside before repeat troponin and vital signs can be obtained.  ?

## 2021-12-05 ENCOUNTER — Telehealth: Payer: Self-pay | Admitting: Pharmacist

## 2021-12-05 NOTE — Telephone Encounter (Signed)
Patient failed to provide requested 2022 financial documentation. No additional medication assistance will be provided by [facility] without the required proof of income documentation. Patient notified by letter. Debra Cheek Administrative Assistant [facility] 

## 2021-12-07 ENCOUNTER — Other Ambulatory Visit: Payer: Self-pay

## 2022-07-29 IMAGING — CR DG CHEST 2V
1 series · 2 of 2 positions shown · non-contrast
Comparison: 01/11/2021

CLINICAL DATA: Chest pain

EXAM:
CHEST - 2 VIEW

[Series 1: dg chest 2 view · 0.14mm/px · 2 of 2 slices shown]
[im 1/2]
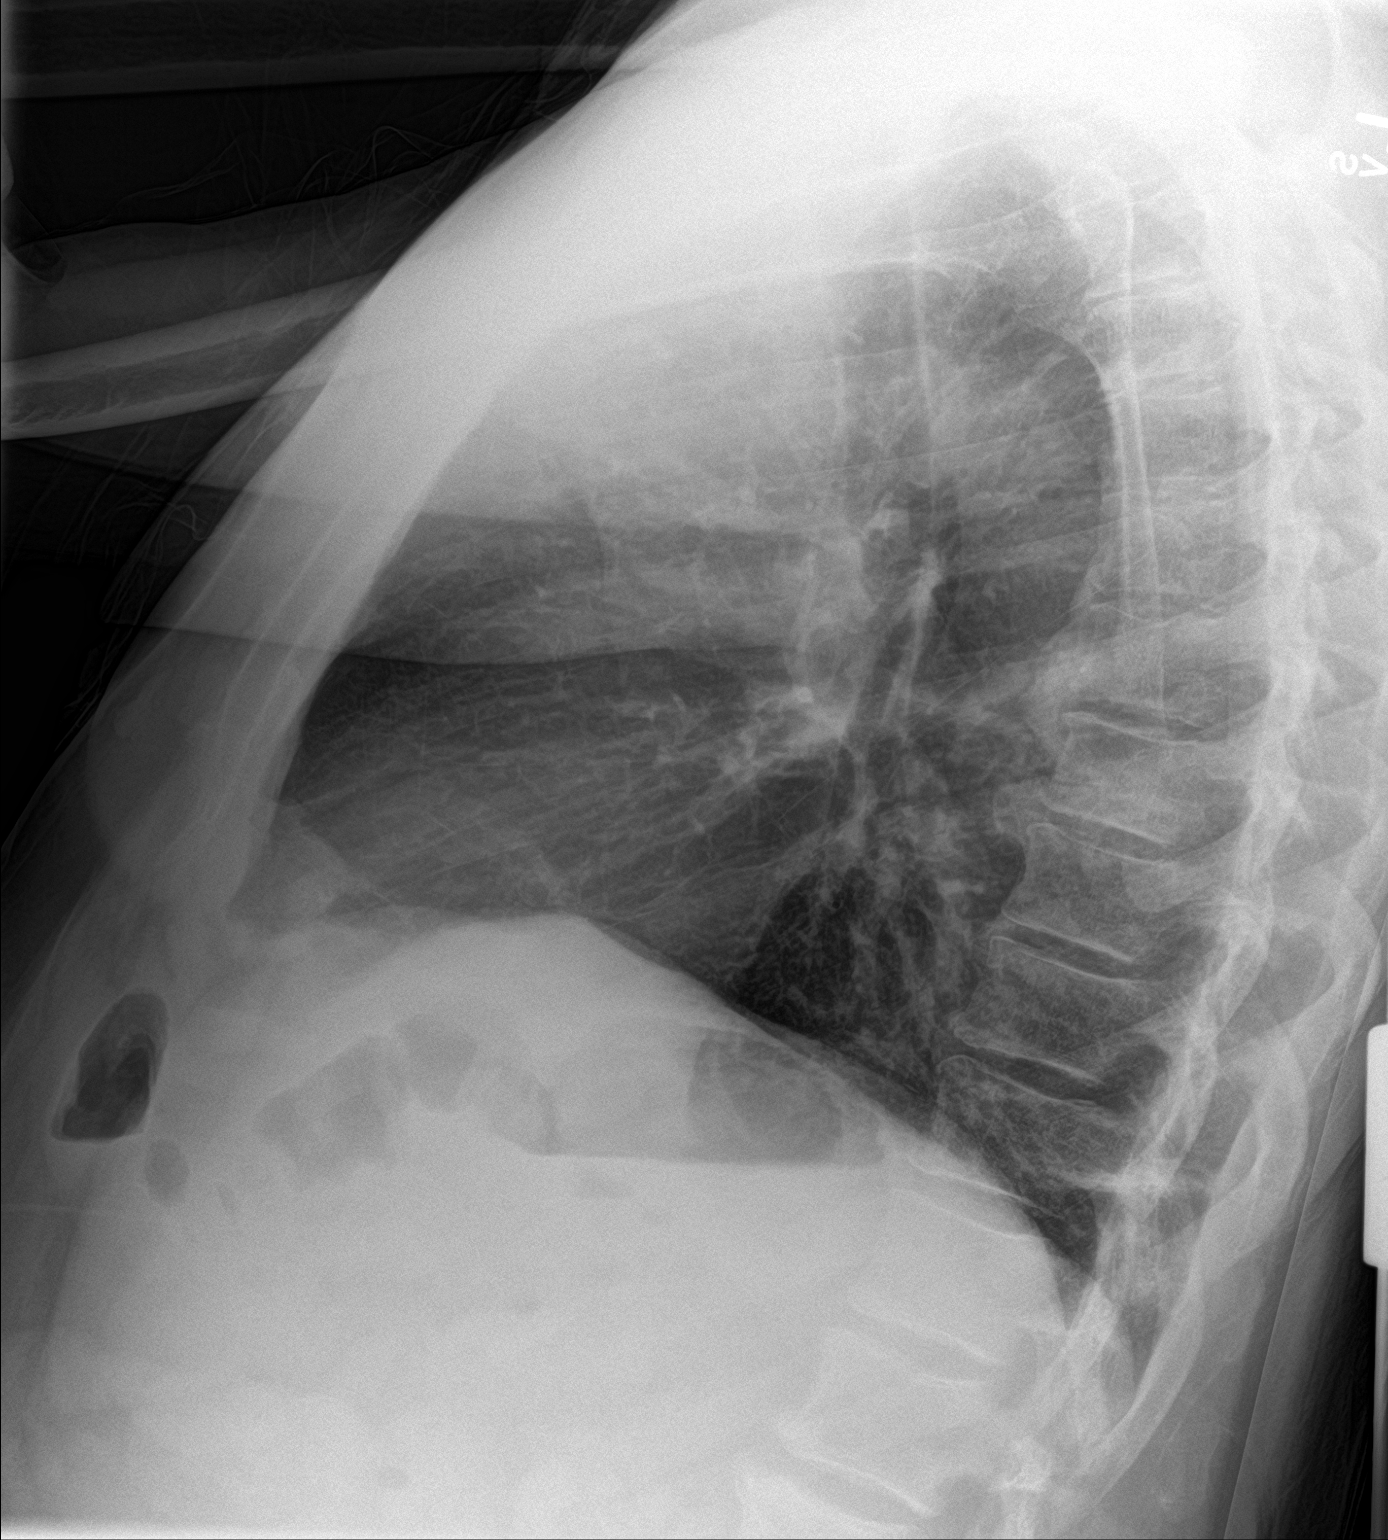
[im 2/2]
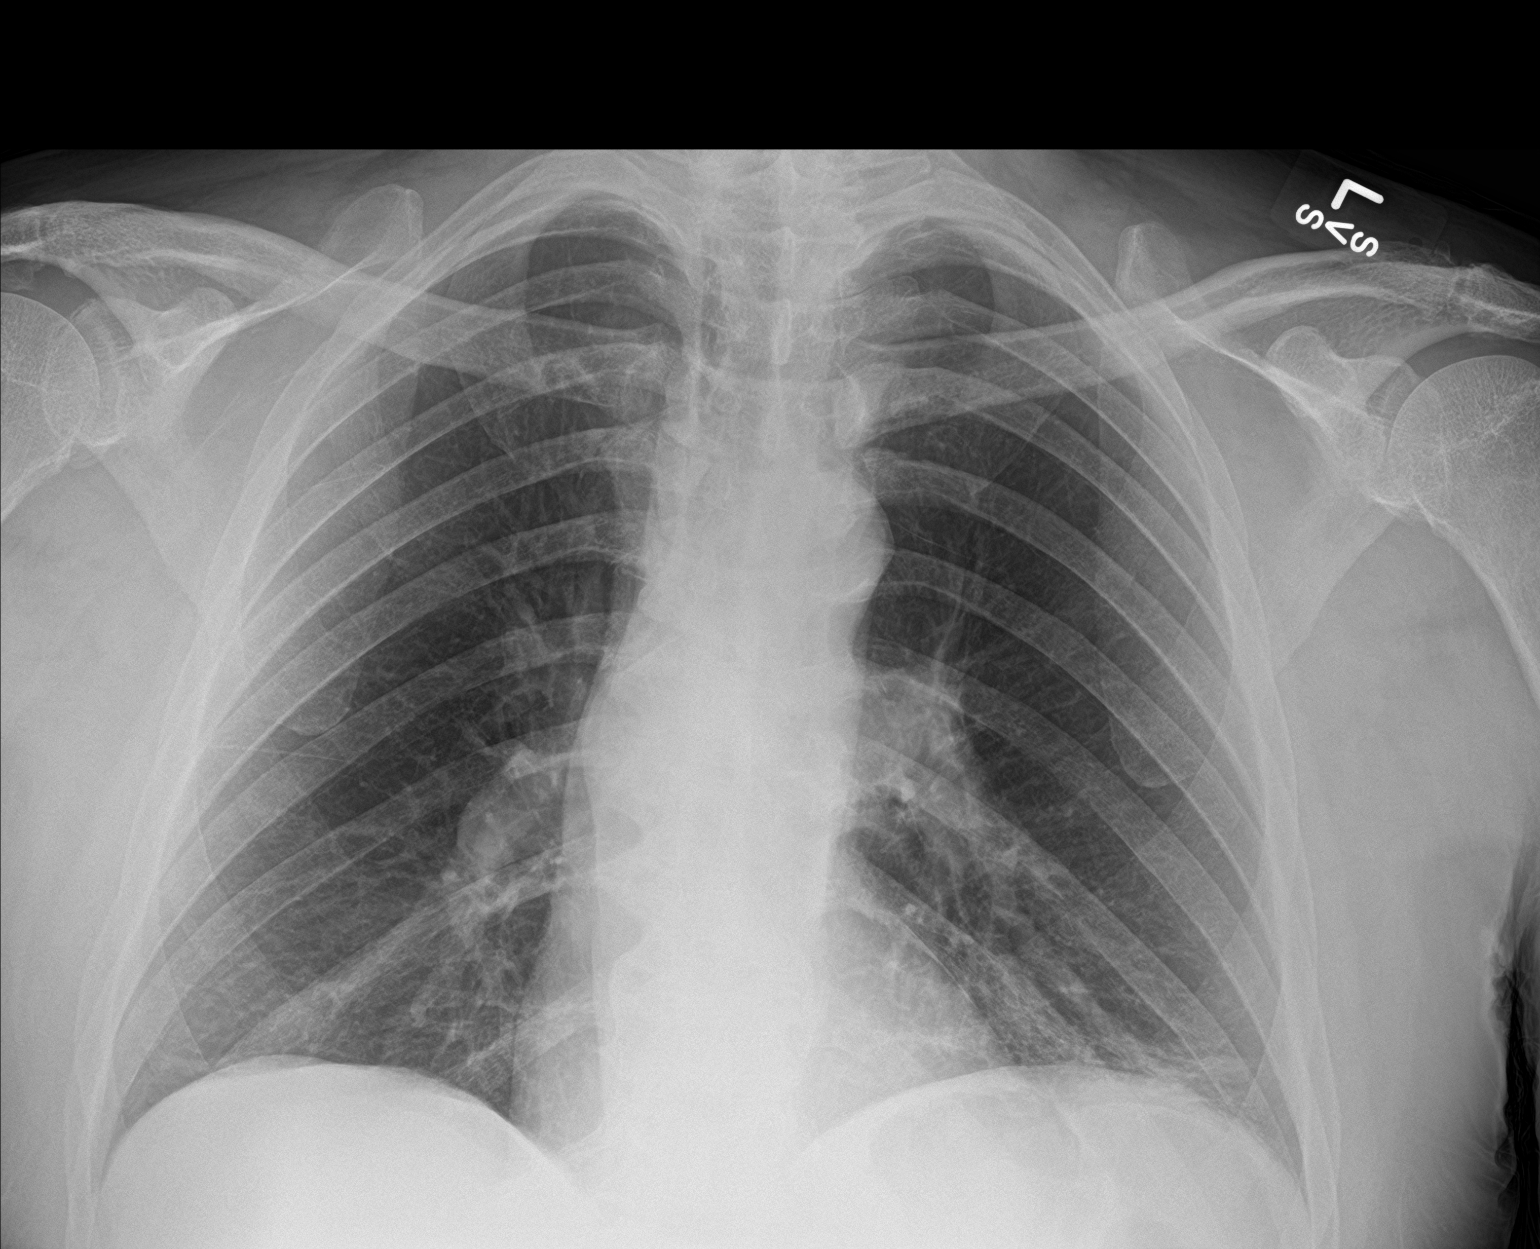

[2 of 2 positions shown; findings below may reference images not displayed]

FINDINGS: Mild left basilar atelectasis. Lungs are otherwise clear. No
pneumothorax or pleural effusion. Cardiac size within normal limits.
Pulmonary vascularity is normal. No acute bone abnormality.
IMPRESSION: No active cardiopulmonary disease.

## 2022-10-06 ENCOUNTER — Emergency Department: Payer: Medicare Other

## 2022-10-06 ENCOUNTER — Inpatient Hospital Stay: Payer: Medicare Other

## 2022-10-06 ENCOUNTER — Encounter: Payer: Self-pay | Admitting: Radiology

## 2022-10-06 ENCOUNTER — Inpatient Hospital Stay
Admission: EM | Admit: 2022-10-06 | Discharge: 2022-10-09 | DRG: 871 | Disposition: A | Discharge status: Home / Self Care | Payer: Medicare Other | Attending: Osteopathic Medicine | Admitting: Osteopathic Medicine

## 2022-10-06 DIAGNOSIS — A419 Sepsis, unspecified organism: Secondary | ICD-10-CM | POA: Diagnosis present

## 2022-10-06 DIAGNOSIS — K760 Fatty (change of) liver, not elsewhere classified: Secondary | ICD-10-CM | POA: Diagnosis present

## 2022-10-06 DIAGNOSIS — E11621 Type 2 diabetes mellitus with foot ulcer: Secondary | ICD-10-CM | POA: Diagnosis present

## 2022-10-06 DIAGNOSIS — D642 Secondary sideroblastic anemia due to drugs and toxins: Secondary | ICD-10-CM | POA: Diagnosis present

## 2022-10-06 DIAGNOSIS — R4182 Altered mental status, unspecified: Secondary | ICD-10-CM | POA: Diagnosis present

## 2022-10-06 DIAGNOSIS — R404 Transient alteration of awareness: Secondary | ICD-10-CM | POA: Diagnosis not present

## 2022-10-06 DIAGNOSIS — D7589 Other specified diseases of blood and blood-forming organs: Secondary | ICD-10-CM | POA: Diagnosis present

## 2022-10-06 DIAGNOSIS — M86671 Other chronic osteomyelitis, right ankle and foot: Secondary | ICD-10-CM | POA: Diagnosis not present

## 2022-10-06 DIAGNOSIS — D638 Anemia in other chronic diseases classified elsewhere: Secondary | ICD-10-CM | POA: Diagnosis present

## 2022-10-06 DIAGNOSIS — L97511 Non-pressure chronic ulcer of other part of right foot limited to breakdown of skin: Secondary | ICD-10-CM | POA: Diagnosis present

## 2022-10-06 DIAGNOSIS — F1010 Alcohol abuse, uncomplicated: Secondary | ICD-10-CM | POA: Insufficient documentation

## 2022-10-06 DIAGNOSIS — Z823 Family history of stroke: Secondary | ICD-10-CM | POA: Diagnosis not present

## 2022-10-06 DIAGNOSIS — F17210 Nicotine dependence, cigarettes, uncomplicated: Secondary | ICD-10-CM | POA: Diagnosis present

## 2022-10-06 DIAGNOSIS — R410 Disorientation, unspecified: Secondary | ICD-10-CM

## 2022-10-06 DIAGNOSIS — G9341 Metabolic encephalopathy: Secondary | ICD-10-CM | POA: Diagnosis present

## 2022-10-06 DIAGNOSIS — E1169 Type 2 diabetes mellitus with other specified complication: Secondary | ICD-10-CM | POA: Diagnosis present

## 2022-10-06 DIAGNOSIS — M86171 Other acute osteomyelitis, right ankle and foot: Secondary | ICD-10-CM | POA: Diagnosis present

## 2022-10-06 DIAGNOSIS — R6520 Severe sepsis without septic shock: Secondary | ICD-10-CM | POA: Diagnosis present

## 2022-10-06 DIAGNOSIS — Z720 Tobacco use: Secondary | ICD-10-CM | POA: Diagnosis present

## 2022-10-06 DIAGNOSIS — N390 Urinary tract infection, site not specified: Secondary | ICD-10-CM | POA: Diagnosis present

## 2022-10-06 DIAGNOSIS — D539 Nutritional anemia, unspecified: Secondary | ICD-10-CM | POA: Diagnosis present

## 2022-10-06 DIAGNOSIS — E871 Hypo-osmolality and hyponatremia: Secondary | ICD-10-CM | POA: Insufficient documentation

## 2022-10-06 DIAGNOSIS — R748 Abnormal levels of other serum enzymes: Secondary | ICD-10-CM

## 2022-10-06 DIAGNOSIS — F1490 Cocaine use, unspecified, uncomplicated: Secondary | ICD-10-CM | POA: Diagnosis present

## 2022-10-06 DIAGNOSIS — Z833 Family history of diabetes mellitus: Secondary | ICD-10-CM

## 2022-10-06 DIAGNOSIS — L03115 Cellulitis of right lower limb: Secondary | ICD-10-CM | POA: Diagnosis present

## 2022-10-06 DIAGNOSIS — L97504 Non-pressure chronic ulcer of other part of unspecified foot with necrosis of bone: Secondary | ICD-10-CM | POA: Diagnosis not present

## 2022-10-06 DIAGNOSIS — L089 Local infection of the skin and subcutaneous tissue, unspecified: Secondary | ICD-10-CM

## 2022-10-06 LAB — GLUCOSE, CAPILLARY: Glucose-Capillary: 127 mg/dL — ABNORMAL HIGH (ref 70–99)

## 2022-10-06 LAB — CBG MONITORING, ED: Glucose-Capillary: 109 mg/dL — ABNORMAL HIGH (ref 70–99)

## 2022-10-06 LAB — CBC WITH DIFFERENTIAL/PLATELET
Abs Immature Granulocytes: 0.04 10*3/uL (ref 0.00–0.07)
Basophils Absolute: 0.1 10*3/uL (ref 0.0–0.1)
Basophils Relative: 1 %
Eosinophils Absolute: 0.1 10*3/uL (ref 0.0–0.5)
Eosinophils Relative: 1 %
HCT: 36.7 % — ABNORMAL LOW (ref 39.0–52.0)
Hemoglobin: 12.6 g/dL — ABNORMAL LOW (ref 13.0–17.0)
Immature Granulocytes: 1 %
Lymphocytes Relative: 27 %
Lymphs Abs: 1.3 10*3/uL (ref 0.7–4.0)
MCH: 37.6 pg — ABNORMAL HIGH (ref 26.0–34.0)
MCHC: 34.3 g/dL (ref 30.0–36.0)
MCV: 109.6 fL — ABNORMAL HIGH (ref 80.0–100.0)
Monocytes Absolute: 0.4 10*3/uL (ref 0.1–1.0)
Monocytes Relative: 7 %
Neutro Abs: 3 10*3/uL (ref 1.7–7.7)
Neutrophils Relative %: 63 %
Platelets: 264 10*3/uL (ref 150–400)
RBC: 3.35 MIL/uL — ABNORMAL LOW (ref 4.22–5.81)
RDW: 13.2 % (ref 11.5–15.5)
WBC: 4.8 10*3/uL (ref 4.0–10.5)
nRBC: 0 % (ref 0.0–0.2)

## 2022-10-06 LAB — TSH: TSH: 0.857 u[IU]/mL (ref 0.350–4.500)

## 2022-10-06 LAB — COMPREHENSIVE METABOLIC PANEL
ALT: 36 U/L (ref 0–44)
AST: 75 U/L — ABNORMAL HIGH (ref 15–41)
Albumin: 3 g/dL — ABNORMAL LOW (ref 3.5–5.0)
Alkaline Phosphatase: 109 U/L (ref 38–126)
Anion gap: 12 (ref 5–15)
BUN: <5 mg/dL — ABNORMAL LOW (ref 8–23)
CO2: 22 mmol/L (ref 22–32)
Calcium: 8.1 mg/dL — ABNORMAL LOW (ref 8.9–10.3)
Chloride: 97 mmol/L — ABNORMAL LOW (ref 98–111)
Creatinine, Ser: 0.64 mg/dL (ref 0.61–1.24)
GFR, Estimated: >60 mL/min (ref >60)
Glucose, Bld: 105 mg/dL — ABNORMAL HIGH (ref 70–99)
Potassium: 4.2 mmol/L (ref 3.5–5.1)
Sodium: 131 mmol/L — ABNORMAL LOW (ref 135–145)
Total Bilirubin: 0.6 mg/dL (ref 0.3–1.2)
Total Protein: 6.5 g/dL (ref 6.5–8.1)

## 2022-10-06 LAB — AMMONIA: Ammonia: 33 umol/L (ref 9–35)

## 2022-10-06 LAB — FOLATE: Folate: 9.5 ng/mL (ref >5.9)

## 2022-10-06 MED ORDER — ACETAMINOPHEN 500 MG PO TABS: 1000.0000 mg | ORAL_TABLET | Freq: Four times a day (QID) | ORAL | Status: DC | PRN

## 2022-10-06 MED ORDER — INSULIN ASPART 100 UNIT/ML IJ SOLN: 0.0000 [IU] | Freq: Every day | INTRAMUSCULAR | Status: DC

## 2022-10-06 MED ORDER — THIAMINE MONONITRATE 100 MG PO TABS
100.0000 mg | ORAL_TABLET | Freq: Every day | ORAL | Status: DC
  Administered 2022-10-07 – 2022-10-09 (×3): 100 mg via ORAL
  Filled 2022-10-06 (×3): qty 1

## 2022-10-06 MED ORDER — VANCOMYCIN HCL 2000 MG/400ML IV SOLN
2000.0000 mg | Freq: Once | INTRAVENOUS | Status: AC
  Administered 2022-10-06: 2000 mg via INTRAVENOUS
  Filled 2022-10-06 (×2): qty 400

## 2022-10-06 MED ORDER — THIAMINE MONONITRATE 100 MG PO TABS
100.0000 mg | ORAL_TABLET | Freq: Every day | ORAL | Status: DC
  Filled 2022-10-06: qty 1

## 2022-10-06 MED ORDER — FOLIC ACID 1 MG PO TABS
1.0000 mg | ORAL_TABLET | Freq: Every day | ORAL | Status: DC
  Administered 2022-10-07 – 2022-10-09 (×3): 1 mg via ORAL
  Filled 2022-10-06 (×3): qty 1

## 2022-10-06 MED ORDER — THIAMINE HCL 100 MG/ML IJ SOLN
100.0000 mg | Freq: Every day | INTRAMUSCULAR | Status: DC
  Filled 2022-10-06: qty 2

## 2022-10-06 MED ORDER — SODIUM CHLORIDE 0.9 % IV SOLN
2.0000 g | Freq: Three times a day (TID) | INTRAVENOUS | Status: DC
  Administered 2022-10-06 – 2022-10-09 (×9): 2 g via INTRAVENOUS
  Filled 2022-10-06: qty 2
  Filled 2022-10-06 (×2): qty 12.5
  Filled 2022-10-06: qty 2
  Filled 2022-10-06 (×2): qty 12.5
  Filled 2022-10-06: qty 2
  Filled 2022-10-06 (×3): qty 12.5

## 2022-10-06 MED ORDER — ONDANSETRON HCL 4 MG PO TABS: 4.0000 mg | ORAL_TABLET | Freq: Four times a day (QID) | ORAL | Status: DC | PRN

## 2022-10-06 MED ORDER — ADULT MULTIVITAMIN W/MINERALS CH
1.0000 | ORAL_TABLET | Freq: Every day | ORAL | Status: DC
  Administered 2022-10-06 – 2022-10-09 (×4): 1 via ORAL
  Filled 2022-10-06 (×4): qty 1

## 2022-10-06 MED ORDER — NICOTINE 14 MG/24HR TD PT24: 14.0000 mg | MEDICATED_PATCH | Freq: Every day | TRANSDERMAL | Status: DC | PRN

## 2022-10-06 MED ORDER — INSULIN ASPART 100 UNIT/ML IJ SOLN: 0.0000 [IU] | Freq: Three times a day (TID) | INTRAMUSCULAR | Status: DC

## 2022-10-06 MED ORDER — GADOBUTROL 1 MMOL/ML IV SOLN
10.0000 mL | Freq: Once | INTRAVENOUS | Status: AC | PRN
  Administered 2022-10-06: 10 mL via INTRAVENOUS

## 2022-10-06 MED ORDER — THIAMINE HCL 100 MG/ML IJ SOLN
100.0000 mg | Freq: Once | INTRAMUSCULAR | Status: AC
  Administered 2022-10-06: 100 mg via INTRAVENOUS
  Filled 2022-10-06: qty 2

## 2022-10-06 MED ORDER — LORAZEPAM 1 MG PO TABS: 1.0000 mg | ORAL_TABLET | ORAL | Status: DC | PRN

## 2022-10-06 MED ORDER — FOLIC ACID 1 MG PO TABS
1.0000 mg | ORAL_TABLET | Freq: Every day | ORAL | Status: DC
  Filled 2022-10-06: qty 1

## 2022-10-06 MED ORDER — SODIUM CHLORIDE 0.9 % IV SOLN: INTRAVENOUS | Status: AC

## 2022-10-06 MED ORDER — ENOXAPARIN SODIUM 40 MG/0.4ML IJ SOSY
40.0000 mg | PREFILLED_SYRINGE | INTRAMUSCULAR | Status: DC
  Administered 2022-10-06: 40 mg via SUBCUTANEOUS
  Filled 2022-10-06: qty 0.4

## 2022-10-06 MED ORDER — LORAZEPAM 2 MG/ML IJ SOLN
1.0000 mg | INTRAMUSCULAR | Status: DC | PRN
  Filled 2022-10-06: qty 1

## 2022-10-06 MED ORDER — ACETAMINOPHEN 650 MG RE SUPP: 650.0000 mg | Freq: Four times a day (QID) | RECTAL | Status: DC | PRN

## 2022-10-06 MED ORDER — ONDANSETRON HCL 4 MG/2ML IJ SOLN: 4.0000 mg | Freq: Four times a day (QID) | INTRAMUSCULAR | Status: DC | PRN

## 2022-10-06 MED ORDER — FOLIC ACID 1 MG PO TABS
1.0000 mg | ORAL_TABLET | Freq: Once | ORAL | Status: AC
Start: 2022-10-06 — End: 2022-10-06
  Administered 2022-10-06: 1 mg via ORAL
  Filled 2022-10-06: qty 1

## 2022-10-06 MED ORDER — THIAMINE HCL 100 MG/ML IJ SOLN: 100.0000 mg | Freq: Every day | INTRAMUSCULAR | Status: DC

## 2022-10-06 MED ORDER — VANCOMYCIN HCL 1750 MG/350ML IV SOLN
1750.0000 mg | Freq: Two times a day (BID) | INTRAVENOUS | Status: DC
  Administered 2022-10-07 – 2022-10-09 (×5): 1750 mg via INTRAVENOUS
  Filled 2022-10-06 (×5): qty 350

## 2022-10-06 NOTE — Hospital Course (Signed)
[name] is a 62 year old male with chronic alcohol use, history of chronic osteomyelitis, tobacco use, history of cocaine use, who presents emergency department for chief concerns of altered mental status from family for 2 weeks. 02/09: tachycardia, tachypnea. Mild hyponatremia. Macrocytosis w/ mild anemia. UA (+)WBC< nitrite. UCS (+)cocaine metabs. Started abx for diabetic foot ulcer and UTI. RR 22, HR 110, meets SIRS criteria. Concern for sepsis w/ UTI and infected diabetic foot ulcer therefore meets sepsis criteria. CT head, CXR no concerns. MR foot ordered. US abdomen  02/10: SIRS/sepsis parameters resolved as of today. Pending UCx, BCx. MRI resutls for foot as below, (+)cellulitis, no osteomyelitis. ABI No definitive evidence of significant lower extremity arterial occlusive disease. 02/11: UCx multiple species, suggest recollection - has been on abx and improving, will continue as below, repeat UA in AM. Podiatry to see today. BCx NGx2d   02/12: Podiatry saw him yesterday, ok to follow outpatient.    MRI R foot Diffuse soft tissue edema along the dorsal aspect of the foot with a soft tissue wound along the dorsal medial midfoot and associated knee enhancement concerning for cellulitis. No drainable fluid collection to suggest an abscess. 2. No osteomyelitis of the right foot.     Consultants:  Podiatry   Procedures: none      ASSESSMENT & PLAN:   Principal Problem:   Altered mental status Active Problems:   Anemia of chronic disease   Tobacco abuse   Acute osteomyelitis of toe of right foot (HCC)   Diabetic foot ulcer (HCC)   Hyponatremia   Alcohol abuse  Altered mental status likely d/t sepsis, UTI - improved Urinary tract infection  IV Abx vancomycin and cefepime for cellulitis should cover any urinary pathogens -->  Will not repeat culture as he's already been on abx and improving, culture likely to be negative. Continue treatment  Diabetic foot ulcer  ([place]) Cellulitis R foot  Hx osteomyelitis L foot - Pt had amputation of left ray and bone biopsy on 07/01/21, lost to follow-up after that  No osteomyelitis R foot on MRI ABI - no concerns Vancomycin and cefepime BCx results NGx2d Podiatry consult - can follow outpatient Aquacel Ag with padded bandage daily  WBAT surgical shoe   Alcohol abuse CIWA protocol Vitamin/mineral supplementation   Hyponatremia Resolved Monitor BMP  Tobacco abuse As needed nicotine patch ordered   Anemia of chronic disease Macrocytic anemia, presumed secondary to chronic EtOH use Follow CBC       DVT prophylaxis: heparin in case needs to go to the OR Pertinent IV fluids/nutrition: no continuous IV fluids  Central lines / invasive devices: none  Code Status: FULL CODE  Current Admission Status: inpatient  TOC needs / Dispo plan: HH Barriers to discharge / significant pending items: podiatry eval, IV abx continuing today, if needing surgical intervention will be here few more days likely

## 2022-10-06 NOTE — Assessment & Plan Note (Addendum)
-   Vancomycin and cefepime - MR of the right foot with contrast ordered - AM team to consult podiatry as appropriate

## 2022-10-06 NOTE — Assessment & Plan Note (Signed)
-   As needed nicotine patch ordered ?

## 2022-10-06 NOTE — Assessment & Plan Note (Deleted)
Patient has chronic osteomyelitis of the left foot There is a new right lesion with ulceration on his right foot Patient was not able to tell me how long this has been there - Vancomycin, cefepime, blood cultures x 2 have been ordered

## 2022-10-06 NOTE — ED Triage Notes (Signed)
Patient c/o weakness x 1 week and states that he "doesn't feel right"; Son also reports intermittent episodes of memory loss that have been having for approx. 3 months and yellowed sclera; Patient admits to drinking earlier today, he has urinated on himself and is disheveled

## 2022-10-06 NOTE — H&P (Addendum)
History and Physical   [name] [name] [id number] DOB: 1960/11/30 DOA: 10/06/2022  PCP: Pcp, No  Patient coming from: home via EMS  I have personally briefly reviewed patient's old medical records in [name]  Chief Concern: altered mental status  HPI: [name] is a 62 year old male with chronic alcohol use, history of chronic osteomyelitis, tobacco use, history of cocaine use, who presents emergency department for chief concerns of altered mental status from family for 2 weeks.  Initial vitals in the emergency department showed temperature of 97.9, respiration rate of 22, heart rate of 110, blood pressure 125/82, SpO2 97% on room air.  Serum sodium is 131, potassium 4.2, chloride 97, bicarb 22, BUN of less than 5, serum creatinine of 0.64, nonfasting blood glucose 105, EGFR greater than 60, WBC 4.8, hemoglobin 12.6, platelets of 264.  Foley catheter placed 9.5.  AST was 75.  ALT 36.  Albumin is 3.0.  B12 ordered by EDP and pending collection.  CT head without contrast: Was read as no intracranial abnormalities, chronic small vessel ischemic changes.  ED treatment: Folic acid one-time dose, thiamine 100 mg IV one-time dose. --------------------- At bedside patient was able to tell me his name, his age, he knows he is in the hospital and he knows the current calendar year.  He states nothing is bothering him.  He does not know why his family called EMS to bring him to the hospital.  He states there is an ulcer on his right leg and has been a long time, "it does not bother me".  Patient denies chest pain, shortness of breath, nausea, vomiting, dysuria, hematuria, penile discharge, abdominal pain.  Per ED report, patient's cousin saw patient about 2 weeks ago and noted to patient's son that patient was not acting right.  Son again noticed patient "not acting right" this week, prompting him to call EMS for further evaluation.  Social history: He is a daily alcohol user,  I had to asked several times as he kept saying I drink a lot, too much.  Ultimately he endorsed that he drinks 4 to 5 bottles of 40 ounce beers per day.  He endorses occasional crack cocaine use, last use was about 1 week ago.  ROS: Constitutional: no weight change, no fever ENT/Mouth: no sore throat, no rhinorrhea Eyes: no eye pain, no vision changes Cardiovascular: no chest pain, no dyspnea,  no edema, no palpitations Respiratory: no cough, no sputum, no wheezing Gastrointestinal: no nausea, no vomiting, no diarrhea, no constipation Genitourinary: no urinary incontinence, no dysuria, no hematuria Musculoskeletal: no arthralgias, no myalgias Skin: + skin lesions, no pruritus, Neuro: + weakness, no loss of consciousness, no syncope Psych: no anxiety, no depression, + decrease appetite Heme/Lymph: no bruising, no bleeding  ED Course: Discussed with emergency medicine provider, patient requiring hospitalization for chief concerns of altered mental status.  Assessment/Plan  Principal Problem:   Altered mental status Active Problems:   Anemia of chronic disease   Tobacco abuse   Acute osteomyelitis of toe of right foot (HCC)   Diabetic foot ulcer (HCC)   Hyponatremia   Alcohol abuse   Assessment and Plan:  * Altered mental status - Patient's baseline is unknown to me at this time - However given chronic endorsement of altered mental status, of at least 2 weeks, I do not suspect this is secondary to hyponatremia - Differentials include metabolic encephalopathy secondary to alcohol abuse versus B12 deficiency in setting of poor nutrition, alcohol and polysubstance abuse,  versus infectious etiology including neuro syphilis versus infectious etiology - Check ammonia level, B12, syphilis/RPR, HIV - Check UA, urine culture, blood cultures x 2 - Admit to telemetry medical, inpatient  Alcohol abuse - CIWA protocol - Vitamin B-1 and B9 ordered - Check serum thiamine  level  Hyponatremia - Mild  Diabetic foot ulcer (HCC) - Vancomycin and cefepime - MR of the right foot with contrast ordered - AM team to consult podiatry as appropriate  Acute osteomyelitis of toe of right foot ([place]) Patient has chronic osteomyelitis of the LEFT foot There is a new right lesion with ulceration on his RIGHT foot Patient was not able to tell me how long this has been there  - Vancomycin, cefepime, blood cultures x 2 have been ordered  Tobacco abuse - As needed nicotine patch ordered  Anemia of chronic disease Macrocytic anemia, presumed secondary to chronic EtOH use - CBC in a.m.  Chart reviewed.   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[place])    Past Surgical History:  Procedure Laterality Date   ACHILLES TENDON SURGERY Right 09/22/2016   Procedure: ACHILLES TENDON LENGTHENING;  Surgeon: Samara Deist, DPM;  Location: [facility];  Service: Podiatry;  Laterality: Right;   AMPUTATION Right 01/14/2021   Procedure: AMPUTATION RAY;  Surgeon: Criselda Peaches, DPM;  Location: [facility];  Service: Podiatry;  Laterality: Right;   AMPUTATION Left 07/01/2021   Procedure: AMPUTATION RAY;  Surgeon: Edrick Kins, DPM;  Location: [facility];  Service: Podiatry;  Laterality: Left;   AMPUTATION TOE Left 07/12/2015   Procedure: AMPUTATION TOE;  Surgeon: Samara Deist, DPM;  Location: [facility];  Service: Podiatry;  Laterality: Left;   AMPUTATION TOE Right 09/22/2016   Procedure: AMPUTATION TOE;  Surgeon: Samara Deist, DPM;  Location: [facility];  Service: Podiatry;  Laterality: Right;   BONE BIOPSY Left 07/01/2021   Procedure:  BONE BIOPSY;  Surgeon: Edrick Kins, DPM;  Location: [facility];  Service: Podiatry;  Laterality: Left;   INCISION AND DRAINAGE OF WOUND Bilateral 01/14/2021   Procedure: IRRIGATION AND DEBRIDEMENT WOUND;  Surgeon: Criselda Peaches, DPM;  Location: [facility];  Service: Podiatry;  Laterality: Bilateral;   IRRIGATION AND DEBRIDEMENT FOOT Left 07/12/2015   Procedure: IRRIGATION AND DEBRIDEMENT FOOT;  Surgeon: Samara Deist, DPM;  Location: [facility];  Service: Podiatry;  Laterality: Left;   IRRIGATION AND DEBRIDEMENT FOOT Left 09/06/2015   Procedure: IRRIGATION AND DEBRIDEMENT FOOT;  Surgeon: Albertine Patricia, DPM;  Location: [facility];  Service: Podiatry;  Laterality: Left;   Social History:  reports that he has been smoking cigarettes. He has been smoking an average of .5 packs per day. He has never used smokeless tobacco. He reports current alcohol use of about 4.0 standard drinks of alcohol per week. He reports current drug use.  No Known Allergies Family History  Problem Relation Age of Onset   CVA Mother    CVA Brother    Diabetes Mellitus II Father    Family history: Family history reviewed and not pertinent.  Prior to Admission medications   Medication Sig Start Date End Date Taking? Authorizing Provider  doxycycline (VIBRAMYCIN) 100 MG capsule Take  1 capsule (100 mg total) by mouth 2 (two) times daily. 11/22/21   Triplett, Cari B, FNP  nicotine (NICODERM CQ - DOSED IN MG/24 HOURS) 14 mg/24hr patch Place 1 patch (14 mg total) onto the skin daily. Patient not taking: Reported on 01/12/2021 09/08/15   Theodoro Grist, MD  polyethylene glycol (MIRALAX / GLYCOLAX) packet Take 17 g by mouth daily as needed for mild constipation. Patient not taking: Reported on 01/12/2021 09/08/15   Theodoro Grist, MD  traMADol (ULTRAM) 50 MG tablet Take 1 tablet (50 mg total) by mouth every 6 (six) hours as needed. 11/22/21   Victorino Dike, FNP   Physical Exam: Vitals:   10/06/22 1830 10/06/22 1900 10/06/22 1925  10/06/22 2040  BP: (!) 158/92 (!) 151/98  (!) 154/74  Pulse: (!) 106 (!) 105  (!) 110  Resp:  18  18  Temp:   98 F (36.7 C) 99.2 F (37.3 C)  TempSrc:   Oral Oral  SpO2: 100% 99%  99%  Weight:      Height:       Constitutional: appears older than chronological age, NAD, calm, comfortable Eyes: PERRL, lids and conjunctivae normal ENMT: Mucous membranes are moist. Posterior pharynx clear of any exudate or lesions. Age-appropriate dentition. Hearing appropriate Neck: normal, supple, no masses, no thyromegaly Respiratory: clear to auscultation bilaterally, no wheezing, no crackles. Normal respiratory effort. No accessory muscle use.  Cardiovascular: Regular rate and rhythm, no murmurs / rubs / gallops. No extremity edema. 2+ pedal pulses. No carotid bruits.  Abdomen: no tenderness, no masses palpated, no hepatosplenomegaly. Bowel sounds positive.  Musculoskeletal: no clubbing / cyanosis. No joint deformity upper and lower extremities. Good ROM, no contractures, no atrophy. Normal muscle tone.  Skin: + right foot lesion with ulceration approximately 2 cm in diameter  Neurologic: Sensation intact. Strength 5/5 in all 4.  Psychiatric: Normal judgment and insight. Alert and oriented x 3. Normal mood.   EKG: independently reviewed, showing sinus tachycardia with rate of 112, QTc 425  Chest x-ray on Admission: I personally reviewed and I agree with radiologist reading as below.  US ABDOMEN LIMITED RUQ (LIVER/GB)  Result Date: 10/06/2022 CLINICAL DATA:  Abdomen pain abnormal LFT EXAM: ULTRASOUND ABDOMEN LIMITED RIGHT UPPER QUADRANT COMPARISON:  None Available. FINDINGS: Gallbladder: Contracted gallbladder. No shadowing stones. Upper normal wall thickness. Negative sonographic Murphy Common bile duct: Diameter: 3.4 mm Liver: Echogenic liver parenchyma. No focal hepatic abnormality. Portal vein is patent on color Doppler imaging with normal direction of blood flow towards the liver. Other: None.  IMPRESSION: Negative for gallstones or biliary dilatation. Echogenic liver parenchyma consistent with hepatic steatosis. Electronically Signed   By: Donavan Foil M.D.   On: 10/06/2022 20:53   CT Head Wo Contrast  Result Date: 10/06/2022 CLINICAL DATA:  Delirium, memory loss EXAM: CT HEAD WITHOUT CONTRAST TECHNIQUE: Contiguous axial images were obtained from the base of the skull through the vertex without intravenous contrast. RADIATION DOSE REDUCTION: This exam was performed according to the departmental dose-optimization program which includes automated exposure control, adjustment of the mA and/or kV according to patient size and/or use of iterative reconstruction technique. COMPARISON:  11/28/2021 FINDINGS: Brain: No evidence of acute infarction, hemorrhage, extra-axial collection, ventriculomegaly, or mass effect. Tiny old left basal ganglia lacunar infarct. Generalized cerebral atrophy. Periventricular white matter low attenuation likely secondary to microangiopathy. Vascular: Cerebrovascular atherosclerotic calcifications are noted. Skull: Negative for fracture or focal lesion. Sinuses/Orbits: Visualized portions of the orbits are unremarkable. Visualized portions of the  paranasal sinuses are unremarkable. Visualized portions of the mastoid air cells are unremarkable. Other: None. IMPRESSION: 1. No acute intracranial findings. 2. Chronic small vessel ischemic changes. Electronically Signed   By: Kathreen Devoid M.D.   On: 10/06/2022 18:21   DG Chest Port 1 View  Result Date: 10/06/2022 CLINICAL DATA:  Weakness. EXAM: PORTABLE CHEST 1 VIEW COMPARISON:  11/27/2021 chest radiograph FINDINGS: The cardiomediastinal silhouette is unremarkable. There is no evidence of focal airspace disease, pulmonary edema, suspicious pulmonary nodule/mass, pleural effusion, or pneumothorax. No acute bony abnormalities are identified. IMPRESSION: No active disease. Electronically Signed   By: Margarette Canada M.D.   On: 10/06/2022  17:54    Labs on Admission: I have personally reviewed following labs  CBC: Recent Labs  Lab 10/06/22 1555  WBC 4.8  NEUTROABS 3.0  HGB 12.6*  HCT 36.7*  MCV 109.6*  PLT [id number]   Basic Metabolic Panel: Recent Labs  Lab 10/06/22 1555  NA 131*  K 4.2  CL 97*  CO2 22  GLUCOSE 105*  BUN <5*  CREATININE 0.64  CALCIUM 8.1*   GFR: Estimated Creatinine Clearance: 114.5 mL/min (by C-G formula based on SCr of 0.64 mg/dL).  Liver Function Tests: Recent Labs  Lab 10/06/22 1555  AST 75*  ALT 36  ALKPHOS 109  BILITOT 0.6  PROT 6.5  ALBUMIN 3.0*   CBG: Recent Labs  Lab 10/06/22 1559 10/06/22 2040  GLUCAP 109* 127*   Thyroid Function Tests: Recent Labs    10/06/22 1550  TSH 0.857   Anemia Panel: Recent Labs    10/06/22 1550  FOLATE 9.5   Urine analysis:    Component Value Date/Time   COLORURINE YELLOW (A) 01/11/2021 1608   APPEARANCEUR CLEAR (A) 01/11/2021 1608   LABSPEC 1.014 01/11/2021 1608   PHURINE 5.0 01/11/2021 [place] 01/11/2021 1608   [place] 01/11/2021 1608   [place] 01/11/2021 1608   [place] 01/11/2021 [place] 01/11/2021 1608   NITRITE NEGATIVE 01/11/2021 [place] 01/11/2021 1608   This document was prepared using Dragon Voice Recognition software and may include unintentional dictation errors.  Dr. Tobie Poet [facility]  If 7PM-7AM, please contact overnight-coverage provider If 7AM-7PM, please contact day coverage provider [email or web address]  10/06/2022, 9:34 PM

## 2022-10-06 NOTE — Assessment & Plan Note (Addendum)
Patient has chronic osteomyelitis of the LEFT foot There is a new right lesion with ulceration on his RIGHT foot Patient was not able to tell me how long this has been there  - Vancomycin, cefepime, blood cultures x 2 have been ordered

## 2022-10-06 NOTE — ED Triage Notes (Addendum)
Patient c/o weakness x 1 week and states that he "doesn't feel right"; Son also reports intermittent episodes of memory loss that have been having for approx. 3 months and yellowed sclera; Patient admits to drinking earlier today, he has urinated on himself and is disheveled

## 2022-10-06 NOTE — Assessment & Plan Note (Addendum)
-   Patient's baseline is unknown to me at this time - However given chronic endorsement of altered mental status, of at least 2 weeks, I do not suspect this is secondary to hyponatremia - Differentials include metabolic encephalopathy secondary to alcohol abuse versus B12 deficiency in setting of poor nutrition, alcohol and polysubstance abuse, versus infectious etiology including neuro syphilis versus infectious etiology - Check ammonia level, B12, syphilis/RPR, HIV - Check UA, urine culture, blood cultures x 2 - Admit to telemetry medical, inpatient

## 2022-10-06 NOTE — Assessment & Plan Note (Signed)
-   Mild

## 2022-10-06 NOTE — Assessment & Plan Note (Signed)
-   CIWA protocol - Vitamin B-1 and B9 ordered - Check serum thiamine level

## 2022-10-06 NOTE — ED Provider Notes (Signed)
[facility] Provider Note    Event Date/Time   First MD Initiated Contact with Patient 10/06/22 1643     (approximate)   History   Extremity Weakness and Weakness (Patient c/o weakness x 1 week and states that he "doesn't feel right"; Son also reports intermittent episodes of memory loss that have been having for approx. 3 months and yellowed sclera; Patient admits to drinking earlier today, he has urinated on himself and is disheveled) Note above as per triage  EM caveat confusion  HPI  [name] [name] is a 62 y.o. male   here with his son.  Son reports that his cousin whom his father lives with, called and let him know a couple weeks ago that he seemed to be having some confusion and memory loss.  He was forgetting things like his uncle was dead and that he did not recognize that his grandmother had died recently either  The patient has no complaint.  Son has noticed a wound on his right foot, also that the patient seems like he is having some confusion having difficulty remembering things over the last 2 to 3 weeks.  Patient does report daily alcohol use not quite certain, but seems estimate greater than 60 to 100 ounces of beer possibly daily      Physical Exam   Triage Vital Signs: ED Triage Vitals  Enc Vitals Group     BP 10/06/22 1546 125/82     Pulse Rate 10/06/22 1546 (!) 110     Resp 10/06/22 1546 (!) 22     Temp 10/06/22 1546 97.9 F (36.6 C)     Temp Source 10/06/22 1546 Oral     SpO2 10/06/22 1546 97 %     Weight 10/06/22 1544 210 lb (95.3 kg)     Height 10/06/22 1544 6' 4"$  (1.93 m)     Head Circumference --      Peak Flow --      Pain Score 10/06/22 1636 0     Pain Loc --      Pain Edu? --      Excl. in [place]? --     Most recent vital signs: Vitals:   10/06/22 2040 10/06/22 2152  BP: (!) 154/74 (!) 150/85  Pulse: (!) 110 (!) 105  Resp: 18 18  Temp: 99.2 F (37.3 C) 98 F (36.7 C)  SpO2: 99% 100%     General: Awake, no  distress.  Patient oriented to being at a hospital, but first identifies his son as possibly his brother.  He reports he is in the month of January but does not reported to be 2024 CV:  Good peripheral perfusion.  Normal tones and rate Resp:  Normal effort.  Clear bilateral without distress Abd:  No distention.  Protuberant but soft nontender nondistended. Other:  Bilateral lower extremities with good capillary refill, the dorsal surface of his right foot he has an approximately quarter sized superficial ulceration but some slight purulent drainage.  No crepitance or gangrenous changes noted   ED Results / Procedures / Treatments   Labs (all labs ordered are listed, but only abnormal results are displayed) Labs Reviewed  CBC WITH DIFFERENTIAL/PLATELET - Abnormal; Notable for the following components:      Result Value   RBC 3.35 (*)    Hemoglobin 12.6 (*)    HCT 36.7 (*)    MCV 109.6 (*)    MCH 37.6 (*)    All other components within  normal limits  COMPREHENSIVE METABOLIC PANEL - Abnormal; Notable for the following components:   Sodium 131 (*)    Chloride 97 (*)    Glucose, Bld 105 (*)    BUN <5 (*)    Calcium 8.1 (*)    Albumin 3.0 (*)    AST 75 (*)    All other components within normal limits  GLUCOSE, CAPILLARY - Abnormal; Notable for the following components:   Glucose-Capillary 127 (*)    All other components within normal limits  CBG MONITORING, ED - Abnormal; Notable for the following components:   Glucose-Capillary 109 (*)    All other components within normal limits  CULTURE, BLOOD (ROUTINE X 2)  CULTURE, BLOOD (ROUTINE X 2)  URINE CULTURE  FOLATE  TSH  AMMONIA  BASIC METABOLIC PANEL  CBC  HIV ANTIBODY (ROUTINE TESTING W REFLEX)  RPR  VITAMIN B12  URINALYSIS, COMPLETE (UACMP) WITH MICROSCOPIC  URINE DRUG SCREEN, QUALITATIVE ([facility] ONLY)  VITAMIN B1  HEMOGLOBIN A1C     EKG  Interview by me at 1545 heart rate 110 QRS 60 QTc 430 Sinus tachycardia no  evidence of ischemia   RADIOLOGY  Chest x-ray interpreted by me as negative for acute finding  US ABDOMEN LIMITED RUQ (LIVER/GB)  Result Date: 10/06/2022 CLINICAL DATA:  Abdomen pain abnormal LFT EXAM: ULTRASOUND ABDOMEN LIMITED RIGHT UPPER QUADRANT COMPARISON:  None Available. FINDINGS: Gallbladder: Contracted gallbladder. No shadowing stones. Upper normal wall thickness. Negative sonographic Murphy Common bile duct: Diameter: 3.4 mm Liver: Echogenic liver parenchyma. No focal hepatic abnormality. Portal vein is patent on color Doppler imaging with normal direction of blood flow towards the liver. Other: None. IMPRESSION: Negative for gallstones or biliary dilatation. Echogenic liver parenchyma consistent with hepatic steatosis. Electronically Signed   By: Donavan Foil M.D.   On: 10/06/2022 20:53   CT Head Wo Contrast  Result Date: 10/06/2022 CLINICAL DATA:  Delirium, memory loss EXAM: CT HEAD WITHOUT CONTRAST TECHNIQUE: Contiguous axial images were obtained from the base of the skull through the vertex without intravenous contrast. RADIATION DOSE REDUCTION: This exam was performed according to the departmental dose-optimization program which includes automated exposure control, adjustment of the mA and/or kV according to patient size and/or use of iterative reconstruction technique. COMPARISON:  11/28/2021 FINDINGS: Brain: No evidence of acute infarction, hemorrhage, extra-axial collection, ventriculomegaly, or mass effect. Tiny old left basal ganglia lacunar infarct. Generalized cerebral atrophy. Periventricular white matter low attenuation likely secondary to microangiopathy. Vascular: Cerebrovascular atherosclerotic calcifications are noted. Skull: Negative for fracture or focal lesion. Sinuses/Orbits: Visualized portions of the orbits are unremarkable. Visualized portions of the paranasal sinuses are unremarkable. Visualized portions of the mastoid air cells are unremarkable. Other: None.  IMPRESSION: 1. No acute intracranial findings. 2. Chronic small vessel ischemic changes. Electronically Signed   By: Kathreen Devoid M.D.   On: 10/06/2022 18:21   DG Chest Port 1 View  Result Date: 10/06/2022 CLINICAL DATA:  Weakness. EXAM: PORTABLE CHEST 1 VIEW COMPARISON:  11/27/2021 chest radiograph FINDINGS: The cardiomediastinal silhouette is unremarkable. There is no evidence of focal airspace disease, pulmonary edema, suspicious pulmonary nodule/mass, pleural effusion, or pneumothorax. No acute bony abnormalities are identified. IMPRESSION: No active disease. Electronically Signed   By: Margarette Canada M.D.   On: 10/06/2022 17:54       PROCEDURES:  Critical Care performed: No  Procedures   MEDICATIONS ORDERED IN ED: Medications  acetaminophen (TYLENOL) tablet 1,000 mg (has no administration in time range)    Or  acetaminophen (  TYLENOL) suppository 650 mg (has no administration in time range)  ondansetron (ZOFRAN) tablet 4 mg (has no administration in time range)    Or  ondansetron (ZOFRAN) injection 4 mg (has no administration in time range)  enoxaparin (LOVENOX) injection 40 mg (40 mg Subcutaneous Given 10/06/22 2134)  LORazepam (ATIVAN) tablet 1-4 mg (has no administration in time range)    Or  LORazepam (ATIVAN) injection 1-4 mg (has no administration in time range)  multivitamin with minerals tablet 1 tablet (1 tablet Oral Given 10/06/22 2133)  nicotine (NICODERM CQ - dosed in mg/24 hours) patch 14 mg (has no administration in time range)  0.9 %  sodium chloride infusion ( Intravenous New Bag/Given 10/06/22 2138)  ceFEPIme (MAXIPIME) 2 g in sodium chloride 0.9 % 100 mL IVPB (2 g Intravenous New Bag/Given 10/06/22 2156)  vancomycin (VANCOREADY) IVPB 2000 mg/400 mL (has no administration in time range)    Followed by  vancomycin (VANCOREADY) IVPB 1750 mg/350 mL (has no administration in time range)  folic acid (FOLVITE) tablet 1 mg (has no administration in time range)  thiamine (VITAMIN  B1) tablet 100 mg (has no administration in time range)    Or  thiamine (VITAMIN B1) injection 100 mg (has no administration in time range)  insulin aspart (novoLOG) injection 0-5 Units (has no administration in time range)  insulin aspart (novoLOG) injection 0-15 Units (has no administration in time range)  thiamine (VITAMIN B1) injection 100 mg (100 mg Intravenous Given [id number])  folic acid (FOLVITE) tablet 1 mg (1 mg Oral Given 10/06/22 1740)     IMPRESSION / MDM / ASSESSMENT AND PLAN / ED COURSE  I reviewed the triage vital signs and the nursing notes.                              Differential diagnosis includes, but is not limited to, possible new onset of confusion, encephalopathy, differential diagnosis is fairly broad his head CT is normal and reassuring for no evidence of obvious acute stroke or lesion, he does have a mildly elevated AST and a history suggestive of possible underlying alcohol abuse, but ammonia is low normal.  Electrolyte or vitamin deficiencies are considered, folate acid and thiamine given empirically here at the ER.  Considerations for possible what appears to be diabetic foot ulcer of the right foot, but no evidence of acute complication no unilateral swelling no vascular compromise to noted.  He is afebrile.  Labs reveal normal white count.  Comprehensive metabolic panel mild hypoalbuminemia.  Mild hyponatremia.  Patient's presentation is most consistent with acute complicated illness / injury requiring diagnostic workup.  The patient is on the cardiac monitor to evaluate for evidence of arrhythmia and/or significant heart rate changes.   Given the patient's somewhat abrupt change in confusion and memory loss over the last approximately 2 to 3 weeks noted by family and his clinical history and evaluation diabetes etc. I discussed with her hospitalist and consulted with Dr. Tobie Poet will admit.  Admission decision based on need for further workup for causes of  altered mental status and also treatment of diabetic foot wound/ulcer  Patient is understanding of plan for admission.     FINAL CLINICAL IMPRESSION(S) / ED DIAGNOSES   Final diagnoses:  Ulcer of right foot, limited to breakdown of skin ([place])  Abnormal transaminases  Disorientation     Rx / DC Orders   ED Discharge Orders  None        Note:  This document was prepared using Dragon voice recognition software and may include unintentional dictation errors.   Delman Kitten, MD 10/06/22 2221

## 2022-10-06 NOTE — Assessment & Plan Note (Signed)
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

## 2022-10-06 NOTE — Consult Note (Signed)
Pharmacy Antibiotic Note  [name] [name] is a 62 y.o. male admitted on 10/06/2022 with osteomyelitis.  Pharmacy has been consulted for vancomycin and cefepime dosing.  Plan: Cefepime 2g IV every 8 hours Vancomycin 2020m followed by vancomycin 17592mIV every 12 hours Goal AUC 400-550  Est AUC: 512.9 Est Cmax: 36.6 Est Cmin: 13.5 Calculated with SCr 0.8 Continue to monitor and dose adjust antibiotics according to renal function and indication   Height: 6' 4"$  (193 cm) Weight: 95.3 kg (210 lb) IBW/kg (Calculated) : 86.8  Temp (24hrs), Avg:98 F (36.7 C), Min:97.9 F (36.6 C), Max:98 F (36.7 C)  Recent Labs  Lab 10/06/22 1555  WBC 4.8  CREATININE 0.64    Estimated Creatinine Clearance: 114.5 mL/min (by C-G formula based on SCr of 0.64 mg/dL).    No Known Allergies  Antimicrobials this admission: 2/9 vancomycin >>  2/9 cefepime >>    Microbiology results: 2/9 BCx: N/A - notified MD   Thank you for allowing pharmacy to be a part of this patient's care.  AnDarrick Penna/04/2023 7:57 PM

## 2022-10-07 ENCOUNTER — Inpatient Hospital Stay: Payer: Medicare Other

## 2022-10-07 DIAGNOSIS — E11621 Type 2 diabetes mellitus with foot ulcer: Secondary | ICD-10-CM | POA: Diagnosis not present

## 2022-10-07 DIAGNOSIS — A419 Sepsis, unspecified organism: Secondary | ICD-10-CM

## 2022-10-07 DIAGNOSIS — K760 Fatty (change of) liver, not elsewhere classified: Secondary | ICD-10-CM

## 2022-10-07 DIAGNOSIS — D638 Anemia in other chronic diseases classified elsewhere: Secondary | ICD-10-CM | POA: Diagnosis not present

## 2022-10-07 DIAGNOSIS — E871 Hypo-osmolality and hyponatremia: Secondary | ICD-10-CM

## 2022-10-07 DIAGNOSIS — L97504 Non-pressure chronic ulcer of other part of unspecified foot with necrosis of bone: Secondary | ICD-10-CM

## 2022-10-07 DIAGNOSIS — F1010 Alcohol abuse, uncomplicated: Secondary | ICD-10-CM

## 2022-10-07 DIAGNOSIS — N390 Urinary tract infection, site not specified: Secondary | ICD-10-CM

## 2022-10-07 DIAGNOSIS — Z720 Tobacco use: Secondary | ICD-10-CM

## 2022-10-07 LAB — CBC
HCT: 33.1 % — ABNORMAL LOW (ref 39.0–52.0)
Hemoglobin: 11.3 g/dL — ABNORMAL LOW (ref 13.0–17.0)
MCH: 36.6 pg — ABNORMAL HIGH (ref 26.0–34.0)
MCHC: 34.1 g/dL (ref 30.0–36.0)
MCV: 107.1 fL — ABNORMAL HIGH (ref 80.0–100.0)
Platelets: 219 10*3/uL (ref 150–400)
RBC: 3.09 MIL/uL — ABNORMAL LOW (ref 4.22–5.81)
RDW: 13 % (ref 11.5–15.5)
WBC: 5.9 10*3/uL (ref 4.0–10.5)
nRBC: 0 % (ref 0.0–0.2)

## 2022-10-07 LAB — URINALYSIS, COMPLETE (UACMP) WITH MICROSCOPIC
Bilirubin Urine: NEGATIVE
Glucose, UA: NEGATIVE mg/dL
Hgb urine dipstick: NEGATIVE
Ketones, ur: NEGATIVE mg/dL
Nitrite: POSITIVE — AB
Protein, ur: NEGATIVE mg/dL
Specific Gravity, Urine: 1.019 (ref 1.005–1.030)
WBC, UA: >50 WBC/hpf (ref 0–5)
pH: 5 (ref 5.0–8.0)

## 2022-10-07 LAB — RPR: RPR Ser Ql: NONREACTIVE

## 2022-10-07 LAB — URINE DRUG SCREEN, QUALITATIVE (ARMC ONLY)
Amphetamines, Ur Screen: NOT DETECTED
Barbiturates, Ur Screen: NOT DETECTED
Benzodiazepine, Ur Scrn: NOT DETECTED
Cannabinoid 50 Ng, Ur ~~LOC~~: NOT DETECTED
Cocaine Metabolite,Ur ~~LOC~~: POSITIVE — AB
MDMA (Ecstasy)Ur Screen: NOT DETECTED
Methadone Scn, Ur: NOT DETECTED
Opiate, Ur Screen: NOT DETECTED
Phencyclidine (PCP) Ur S: NOT DETECTED
Tricyclic, Ur Screen: NOT DETECTED

## 2022-10-07 LAB — VITAMIN B12: Vitamin B-12: 253 pg/mL (ref 180–914)

## 2022-10-07 LAB — BASIC METABOLIC PANEL
Anion gap: 8 (ref 5–15)
BUN: 6 mg/dL — ABNORMAL LOW (ref 8–23)
CO2: 23 mmol/L (ref 22–32)
Calcium: 8.1 mg/dL — ABNORMAL LOW (ref 8.9–10.3)
Chloride: 104 mmol/L (ref 98–111)
Creatinine, Ser: 0.59 mg/dL — ABNORMAL LOW (ref 0.61–1.24)
GFR, Estimated: >60 mL/min (ref >60)
Glucose, Bld: 87 mg/dL (ref 70–99)
Potassium: 4.2 mmol/L (ref 3.5–5.1)
Sodium: 135 mmol/L (ref 135–145)

## 2022-10-07 LAB — GLUCOSE, CAPILLARY
Glucose-Capillary: 98 mg/dL (ref 70–99)
Glucose-Capillary: 95 mg/dL (ref 70–99)
Glucose-Capillary: 106 mg/dL — ABNORMAL HIGH (ref 70–99)
Glucose-Capillary: 104 mg/dL — ABNORMAL HIGH (ref 70–99)

## 2022-10-07 LAB — HEMOGLOBIN A1C
Hgb A1c MFr Bld: 5 % (ref 4.8–5.6)
Mean Plasma Glucose: 96.8 mg/dL

## 2022-10-07 LAB — HIV ANTIBODY (ROUTINE TESTING W REFLEX): HIV Screen 4th Generation wRfx: NONREACTIVE

## 2022-10-07 MED ORDER — HEPARIN SODIUM (PORCINE) 5000 UNIT/ML IJ SOLN
5000.0000 [IU] | Freq: Three times a day (TID) | INTRAMUSCULAR | Status: DC
  Administered 2022-10-07 – 2022-10-09 (×6): 5000 [IU] via SUBCUTANEOUS
  Filled 2022-10-07 (×6): qty 1

## 2022-10-07 NOTE — Progress Notes (Signed)
Mobility Specialist - Progress Note   10/07/22 1446  Mobility  Activity Ambulated with assistance in room  Level of Assistance Standby assist, set-up cues, supervision of patient - no hands on  Assistive Device None  Distance Ambulated (ft) 10 ft  Activity Response Tolerated well  Mobility Referral Yes  $Mobility charge 1 Mobility   Pt sitting in restroom on RA upon arrival. Pt ambulates from bathroom to bed SBA. Pt does furniture surf throughout ambulation but no LOB noted. Pt left in bed with needs in reach and bed alarm on.   Gretchen Short  Mobility Specialist  10/07/22 2:50 PM

## 2022-10-07 NOTE — Progress Notes (Addendum)
Mobility Specialist - Progress Note   10/07/22 1444  Mobility  Activity Ambulated with assistance in room;Ambulated with assistance to bathroom;Stood at bedside;Dangled on edge of bed  Level of Assistance Standby assist, set-up cues, supervision of patient - no hands on  Assistive Device None  Distance Ambulated (ft) 10 ft  Activity Response Tolerated well  Mobility Referral Yes  $Mobility charge 1 Mobility   Pt supine in bed in soiled sheets on RA upon arrival. Pt completes bed mobility indep. Pt STS and ambulates to bathroom SBA. Pt does furniture surf and ambulate with some instability but no LOB noted. Pt left in restroom with education on how to use call bell for assistance.   Gretchen Short  Mobility Specialist  10/07/22 2:46 PM

## 2022-10-07 NOTE — Progress Notes (Addendum)
PROGRESS NOTE    DALLAN LATZ   [id number] DOB: Jan 11, 1961  DOA: 10/06/2022 Date of Service: 10/07/22 PCP: Merryl Hacker, No     Brief Narrative / Hospital Course:  Mr. Aung Moley is a 62 year old male with chronic alcohol use, history of chronic osteomyelitis, tobacco use, history of cocaine use, who presents emergency department for chief concerns of altered mental status from family for 2 weeks. 02/09: tachycardia, tachypnea. Mild hyponatremia. Macrocytosis w/ mild anemia. UA (+)WBC< nitrite. UCS (+)cocaine metabs. Started abx for diabetic foot ulcer and UTI. RR 22, HR 110, meets SIRS criteria. Concern for sepsis w/ UTI and infected diabetic foot ulcer therefore meets sepsis criteria. CT head, CXR no concerns. MR foot ordered. US abdomen  02/10: SIRS/sepsis parameters resolved as of today. Pending UCx, BCx. MRI resutls for foot as below, (+)cellulitis, no osteomyelitis. ABI No definitive evidence of significant lower extremity arterial occlusive disease.   MRI R foot Diffuse soft tissue edema along the dorsal aspect of the foot with a soft tissue wound along the dorsal medial midfoot and associated knee enhancement concerning for cellulitis. No drainable fluid collection to suggest an abscess. 2. No osteomyelitis of the right foot.     Consultants:  Podiatry   Procedures: none      ASSESSMENT & PLAN:   Principal Problem:   Altered mental status Active Problems:   Anemia of chronic disease   Tobacco abuse   Acute osteomyelitis of toe of right foot (HCC)   Diabetic foot ulcer ([place])   Hyponatremia   Alcohol abuse  Altered mental status likely d/t sepsis, UTI - improved Urinary tract infection  IV Abx vancomycin and cefepime for cellulitis should cover any urinary pathogens pending UCx results  Diabetic foot ulcer ([place]) Cellulitis R foot  Hx osteomyelitis L foot - Pt had amputation of left ray and bone biopsy on 07/01/21, lost to follow-up after that  No  osteomyelitis R foot on MRI ABI - no concerns Vancomycin and cefepime BCx results pending Podiatry consult - will see tomorrow to assess need for debridement  Alcohol abuse CIWA protocol Vitamin/mineral supplementation   Hyponatremia Resolved Monitor BMP  Tobacco abuse As needed nicotine patch ordered   Anemia of chronic disease Macrocytic anemia, presumed secondary to chronic EtOH use Follow CBC       DVT prophylaxis: heparin in case needs to go to the OR Pertinent IV fluids/nutrition: no continuous IV fluids  Central lines / invasive devices: none  Code Status: FULL CODE  Current Admission Status: inpatient  TOC needs / Dispo plan: none at this time Barriers to discharge / significant pending items: podiatry eval, IV abx pending culture results              Subjective / Brief ROS:  Patient reports no concerns, he is in bed watching TV through rounds Denies CP/SOB.  Pain controlled.  Denies new weakness.  Tolerating diet Reports no concerns w/ urination/defecation.   Family Communication: spoke w/ family this afternoon on medical floor, they state he is much more "with it" today     Objective Findings:  Vitals:   10/07/22 0631 10/07/22 0818 10/07/22 1200 10/07/22 1200  BP: (!) 158/94 (!) 168/86 (!) 137/93 (!) 137/93  Pulse: (!) 105 95 90 90  Resp: 16 18    Temp: 98.5 F (36.9 C) 98.2 F (36.8 C)    TempSrc:      SpO2: 100% 100%    Weight:      Height:  Intake/Output Summary (Last 24 hours) at 10/07/2022 1259 Last data filed at 10/07/2022 0700 Gross per 24 hour  Intake --  Output 1850 ml  Net -1850 ml   Filed Weights   10/06/22 1544 10/06/22 2040  Weight: 95.3 kg 108.9 kg    Examination:  Physical Exam Constitutional:      General: He is not in acute distress.    Appearance: Normal appearance. He is obese. He is not ill-appearing.  Cardiovascular:     Rate and Rhythm: Normal rate and regular rhythm.  Pulmonary:      Effort: Pulmonary effort is normal.     Breath sounds: Normal breath sounds.  Abdominal:     Palpations: Abdomen is soft.  Musculoskeletal:        General: Normal range of motion.  Skin:    General: Skin is warm and dry.  Neurological:     General: No focal deficit present.     Mental Status: He is alert. He is disoriented.     Comments: Reports year is "2020-something" and it's January          Scheduled Medications:   folic acid  1 mg Oral Daily   heparin injection (subcutaneous)  5,000 Units Subcutaneous Q8H   insulin aspart  0-15 Units Subcutaneous TID WC   insulin aspart  0-5 Units Subcutaneous QHS   multivitamin with minerals  1 tablet Oral Daily   thiamine  100 mg Oral Daily   Or   thiamine  100 mg Intravenous Daily    Continuous Infusions:  sodium chloride 125 mL/hr at 10/07/22 1159   ceFEPime (MAXIPIME) IV 2 g (10/07/22 1207)   vancomycin 1,750 mg (10/07/22 0852)    PRN Medications:  acetaminophen **OR** acetaminophen, LORazepam **OR** LORazepam, nicotine, ondansetron **OR** ondansetron (ZOFRAN) IV  Antimicrobials from admission:  Anti-infectives (From admission, onward)    Start     Dose/Rate Route Frequency Ordered Stop   10/07/22 1000  vancomycin (VANCOREADY) IVPB 1750 mg/350 mL       See Hyperspace for full Linked Orders Report.   1,750 mg 175 mL/hr over 120 Minutes Intravenous Every 12 hours 10/06/22 2002     10/06/22 2015  ceFEPIme (MAXIPIME) 2 g in sodium chloride 0.9 % 100 mL IVPB        2 g 200 mL/hr over 30 Minutes Intravenous Every 8 hours 10/06/22 2002     10/06/22 2015  vancomycin (VANCOREADY) IVPB 2000 mg/400 mL       See Hyperspace for full Linked Orders Report.   2,000 mg 200 mL/hr over 120 Minutes Intravenous  Once 10/06/22 2002 10/07/22 0052           Data Reviewed:  I have personally reviewed the following...  CBC: Recent Labs  Lab 10/06/22 1555 10/07/22 0520  WBC 4.8 5.9  NEUTROABS 3.0  --   HGB 12.6* 11.3*  HCT  36.7* 33.1*  MCV 109.6* 107.1*  PLT 264 [id number]   Basic Metabolic Panel: Recent Labs  Lab 10/06/22 1555 10/07/22 0520  NA 131* 135  K 4.2 4.2  CL 97* 104  CO2 22 23  GLUCOSE 105* 87  BUN <5* 6*  CREATININE 0.64 0.59*  CALCIUM 8.1* 8.1*   GFR: Estimated Creatinine Clearance: 126.1 mL/min (A) (by C-G formula based on SCr of 0.59 mg/dL (L)). Liver Function Tests: Recent Labs  Lab 10/06/22 1555  AST 75*  ALT 36  ALKPHOS 109  BILITOT 0.6  PROT 6.5  ALBUMIN 3.0*   No  results for input(s): "LIPASE", "AMYLASE" in the last 168 hours. Recent Labs  Lab 10/06/22 1926  AMMONIA 33   Coagulation Profile: No results for input(s): "INR", "PROTIME" in the last 168 hours. Cardiac Enzymes: No results for input(s): "CKTOTAL", "CKMB", "CKMBINDEX", "TROPONINI" in the last 168 hours. BNP (last 3 results) No results for input(s): "PROBNP" in the last 8760 hours. HbA1C: Recent Labs    10/07/22 0520  HGBA1C 5.0   CBG: Recent Labs  Lab 10/06/22 1559 10/06/22 2040 10/07/22 0819 10/07/22 1202  GLUCAP 109* 127* 98 95   Lipid Profile: No results for input(s): "CHOL", "HDL", "LDLCALC", "TRIG", "CHOLHDL", "LDLDIRECT" in the last 72 hours. Thyroid Function Tests: Recent Labs    10/06/22 1550  TSH 0.857   Anemia Panel: Recent Labs    10/06/22 1550 10/06/22 2054  VITAMINB12  --  253  FOLATE 9.5  --    Most Recent Urinalysis On File:     Component Value Date/Time   COLORURINE YELLOW (A) 10/07/2022 0040   APPEARANCEUR HAZY (A) 10/07/2022 0040   LABSPEC 1.019 10/07/2022 0040   PHURINE 5.0 10/07/2022 0040   GLUCOSEU NEGATIVE 10/07/2022 0040   HGBUR NEGATIVE 10/07/2022 0040   BILIRUBINUR NEGATIVE 10/07/2022 0040   KETONESUR NEGATIVE 10/07/2022 0040   PROTEINUR NEGATIVE 10/07/2022 0040   NITRITE POSITIVE (A) 10/07/2022 0040   LEUKOCYTESUR LARGE (A) 10/07/2022 0040   Sepsis Labs: @LABRCNTIP$ (procalcitonin:4,lacticidven:4) Microbiology: Recent Results (from the past 240  hour(s))  Culture, blood (Routine X 2) w Reflex to ID Panel     Status: None (Preliminary result)   Collection Time: 10/06/22  8:54 PM   Specimen: BLOOD LEFT ARM  Result Value Ref Range Status   Specimen Description BLOOD LEFT ARM  Final   Special Requests   Final    BOTTLES DRAWN AEROBIC AND ANAEROBIC Blood Culture results may not be optimal due to an excessive volume of blood received in culture bottles   Culture   Final    NO GROWTH < 12 HOURS Performed at [facility], [address] [place], [place] [address]    Report Status PENDING  Incomplete  Culture, blood (Routine X 2) w Reflex to ID Panel     Status: None (Preliminary result)   Collection Time: 10/06/22  8:59 PM   Specimen: BLOOD RIGHT HAND  Result Value Ref Range Status   Specimen Description BLOOD RIGHT HAND  Final   Special Requests   Final    BOTTLES DRAWN AEROBIC AND ANAEROBIC Blood Culture adequate volume   Culture   Final    NO GROWTH < 12 HOURS Performed at [facility], [address] [place], [place] [address]    Report Status PENDING  Incomplete      Radiology Studies last 3 days: US ARTERIAL ABI (SCREENING LOWER EXTREMITY)  Result Date: 10/07/2022 CLINICAL DATA:  Diabetic foot ulcer. Prior bilateral toe amputation. Current smoker. Diabetic. EXAM: NONINVASIVE PHYSIOLOGIC VASCULAR STUDY OF BILATERAL LOWER EXTREMITIES TECHNIQUE: Evaluation of both lower extremities were performed at rest, including calculation of ankle-brachial indices with single level pressure measurements and doppler recording. COMPARISON:  None available. FINDINGS: Right ABI:  1.03 Left ABI:  1.10 Right Lower Extremity: Monophasic waveform seen in the dorsalis pedis artery. Posterior tibial artery is multiphasic. Left Lower Extremity:  Normal arterial waveforms at the ankle. 1.0-1.4 Normal IMPRESSION: No definitive evidence of significant lower extremity arterial occlusive disease. Electronically Signed   By: Miachel Roux  M.D.   On: 10/07/2022 12:01  MR FOOT RIGHT W WO CONTRAST  Result Date: 10/07/2022 CLINICAL DATA:  Prior amputation. Diabetic foot ulcer evaluate for osteomyelitis EXAM: MRI OF THE RIGHT FOREFOOT WITHOUT AND WITH CONTRAST TECHNIQUE: Multiplanar, multisequence MR imaging of the right foot was performed before and after the administration of intravenous contrast. CONTRAST:  56m GADAVIST GADOBUTROL 1 MMOL/ML IV SOLN COMPARISON:  None Available. FINDINGS: Bones/Joint/Cartilage No acute fracture or dislocation. Prior amputation of the second metatarsal head and phalanx. Prior amputation of the distal third of the third metatarsal and proximal phalanx. Normal alignment. No joint effusion. No marrow signal abnormality. No bone destruction or periosteal reaction. Hallux valgus. Moderate osteoarthritis of the first MTP joint. Ligaments Collateral ligaments are intact.  Lisfranc ligament is intact. Muscles and Tendons Flexor, peroneal and extensor compartment tendons are intact. Diffuse muscle atrophy. Soft tissue No fluid collection or hematoma. No soft tissue mass. Diffuse soft tissue edema along the dorsal aspect of the foot with a soft tissue wound along the dorsal medial midfoot and associated knee enhancement concerning for cellulitis. IMPRESSION: 1. Diffuse soft tissue edema along the dorsal aspect of the foot with a soft tissue wound along the dorsal medial midfoot and associated knee enhancement concerning for cellulitis. No drainable fluid collection to suggest an abscess. 2. No osteomyelitis of the right foot. Electronically Signed   By: HKathreen DevoidM.D.   On: 10/07/2022 09:07   [place]ABDOMEN LIMITED RUQ (LIVER/GB)  Result Date: 10/06/2022 CLINICAL DATA:  Abdomen pain abnormal LFT EXAM: ULTRASOUND ABDOMEN LIMITED RIGHT UPPER QUADRANT COMPARISON:  None Available. FINDINGS: Gallbladder: Contracted gallbladder. No shadowing stones. Upper normal wall thickness. Negative sonographic Murphy Common bile duct:  Diameter: 3.4 mm Liver: Echogenic liver parenchyma. No focal hepatic abnormality. Portal vein is patent on color Doppler imaging with normal direction of blood flow towards the liver. Other: None. IMPRESSION: Negative for gallstones or biliary dilatation. Echogenic liver parenchyma consistent with hepatic steatosis. Electronically Signed   By: KDonavan FoilM.D.   On: 10/06/2022 20:53   CT Head Wo Contrast  Result Date: 10/06/2022 CLINICAL DATA:  Delirium, memory loss EXAM: CT HEAD WITHOUT CONTRAST TECHNIQUE: Contiguous axial images were obtained from the base of the skull through the vertex without intravenous contrast. RADIATION DOSE REDUCTION: This exam was performed according to the departmental dose-optimization program which includes automated exposure control, adjustment of the mA and/or kV according to patient size and/or use of iterative reconstruction technique. COMPARISON:  11/28/2021 FINDINGS: Brain: No evidence of acute infarction, hemorrhage, extra-axial collection, ventriculomegaly, or mass effect. Tiny old left basal ganglia lacunar infarct. Generalized cerebral atrophy. Periventricular white matter low attenuation likely secondary to microangiopathy. Vascular: Cerebrovascular atherosclerotic calcifications are noted. Skull: Negative for fracture or focal lesion. Sinuses/Orbits: Visualized portions of the orbits are unremarkable. Visualized portions of the paranasal sinuses are unremarkable. Visualized portions of the mastoid air cells are unremarkable. Other: None. IMPRESSION: 1. No acute intracranial findings. 2. Chronic small vessel ischemic changes. Electronically Signed   By: HKathreen DevoidM.D.   On: 10/06/2022 18:21   DG Chest Port 1 View  Result Date: 10/06/2022 CLINICAL DATA:  Weakness. EXAM: PORTABLE CHEST 1 VIEW COMPARISON:  11/27/2021 chest radiograph FINDINGS: The cardiomediastinal silhouette is unremarkable. There is no evidence of focal airspace disease, pulmonary edema, suspicious  pulmonary nodule/mass, pleural effusion, or pneumothorax. No acute bony abnormalities are identified. IMPRESSION: No active disease. Electronically Signed   By: JMargarette CanadaM.D.   On: 10/06/2022 17:54  LOS: 1 day      Emeterio Reeve, DO [facility] 10/07/2022, 12:59 PM    Dictation software may have been used to generate the above note. Typos may occur and escape review in typed/dictated notes. Please contact Dr Sheppard Coil directly for clarity if needed.  Staff may message me via secure chat in [place]  but this may not receive an immediate response,  please page me for urgent matters!  If 7PM-7AM, please contact night coverage [email or web address]

## 2022-10-08 DIAGNOSIS — M86671 Other chronic osteomyelitis, right ankle and foot: Secondary | ICD-10-CM

## 2022-10-08 DIAGNOSIS — D638 Anemia in other chronic diseases classified elsewhere: Secondary | ICD-10-CM | POA: Diagnosis not present

## 2022-10-08 DIAGNOSIS — A419 Sepsis, unspecified organism: Secondary | ICD-10-CM | POA: Diagnosis not present

## 2022-10-08 DIAGNOSIS — E11621 Type 2 diabetes mellitus with foot ulcer: Secondary | ICD-10-CM | POA: Diagnosis not present

## 2022-10-08 DIAGNOSIS — F1010 Alcohol abuse, uncomplicated: Secondary | ICD-10-CM | POA: Diagnosis not present

## 2022-10-08 LAB — GLUCOSE, CAPILLARY
Glucose-Capillary: 97 mg/dL (ref 70–99)
Glucose-Capillary: 107 mg/dL — ABNORMAL HIGH (ref 70–99)
Glucose-Capillary: 95 mg/dL (ref 70–99)
Glucose-Capillary: 144 mg/dL — ABNORMAL HIGH (ref 70–99)

## 2022-10-08 LAB — URINE CULTURE

## 2022-10-08 NOTE — Progress Notes (Signed)
PROGRESS NOTE    [name] [name]   [id number] DOB: October 07, 1960  DOA: 10/06/2022 Date of Service: 10/08/22 PCP: Merryl Hacker, No     Brief Narrative / Hospital Course:  [name] [name] is a 62 year old male with chronic alcohol use, history of chronic osteomyelitis, tobacco use, history of cocaine use, who presents emergency department for chief concerns of altered mental status from family for 2 weeks. 02/09: tachycardia, tachypnea. Mild hyponatremia. Macrocytosis w/ mild anemia. UA (+)WBC< nitrite. UCS (+)cocaine metabs. Started abx for diabetic foot ulcer and UTI. RR 22, HR 110, meets SIRS criteria. Concern for sepsis w/ UTI and infected diabetic foot ulcer therefore meets sepsis criteria. CT head, CXR no concerns. MR foot ordered. US abdomen  02/10: SIRS/sepsis parameters resolved as of today. Pending UCx, BCx. MRI resutls for foot as below, (+)cellulitis, no osteomyelitis. ABI No definitive evidence of significant lower extremity arterial occlusive disease. 02/11: UCx multiple species, suggest recollection - has been on abx and improving, will continue as below, repeat UA in AM. Podiatry to see today. BCx NGx2d     MRI R foot Diffuse soft tissue edema along the dorsal aspect of the foot with a soft tissue wound along the dorsal medial midfoot and associated knee enhancement concerning for cellulitis. No drainable fluid collection to suggest an abscess. 2. No osteomyelitis of the right foot.     Consultants:  Podiatry   Procedures: none      ASSESSMENT & PLAN:   Principal Problem:   Altered mental status Active Problems:   Anemia of chronic disease   Tobacco abuse   Acute osteomyelitis of toe of right foot (HCC)   Diabetic foot ulcer (HCC)   Hyponatremia   Alcohol abuse  Altered mental status likely d/t sepsis, UTI - improved Urinary tract infection  IV Abx vancomycin and cefepime for cellulitis should cover any urinary pathogens Will not repeat culture as he's  already been on abx and improving, culture likely to be negative. Continue treatment  Diabetic foot ulcer ([place]) Cellulitis R foot  Hx osteomyelitis L foot - Pt had amputation of left ray and bone biopsy on 07/01/21, lost to follow-up after that  No osteomyelitis R foot on MRI ABI - no concerns Vancomycin and cefepime BCx results NGx2d Podiatry consult - will see today to assess need for debridement  Alcohol abuse CIWA protocol Vitamin/mineral supplementation   Hyponatremia Resolved Monitor BMP  Tobacco abuse As needed nicotine patch ordered   Anemia of chronic disease Macrocytic anemia, presumed secondary to chronic EtOH use Follow CBC       DVT prophylaxis: heparin in case needs to go to the OR Pertinent IV fluids/nutrition: no continuous IV fluids  Central lines / invasive devices: none  Code Status: FULL CODE  Current Admission Status: inpatient  TOC needs / Dispo plan: HH Barriers to discharge / significant pending items: podiatry eval, IV abx continuing today, if needing surgical intervention will be here few more days likely              Subjective / Brief ROS:  Patient reports no concerns, he is in bed watching TV during rounds Denies CP/SOB.  Pain controlled.  Denies new weakness.  Tolerating diet  Family Communication: spoke w/ family yesterday afternoon on medical floor, they state he is much more "with it"     Objective Findings:  Vitals:   10/07/22 2124 10/08/22 0016 10/08/22 0503 10/08/22 0824  BP: 135/80 (!) 148/79 (!) 151/99 (!) 154/94  Pulse: (!) 102 Marland Kitchen)  101 96 86  Resp: 16 18 18 18  $ Temp: 98.7 F (37.1 C) 99.2 F (37.3 C) 98.6 F (37 C) 98 F (36.7 C)  TempSrc: Oral Oral    SpO2: 100% 100% 98% 100%  Weight:      Height:        Intake/Output Summary (Last 24 hours) at 10/08/2022 1112 Last data filed at 10/08/2022 1032 Gross per 24 hour  Intake 2102.47 ml  Output 1600 ml  Net 502.47 ml   Filed Weights   10/06/22 1544  10/06/22 2040  Weight: 95.3 kg 108.9 kg    Examination:  Physical Exam Constitutional:      General: He is not in acute distress.    Appearance: Normal appearance. He is obese. He is not ill-appearing.  Cardiovascular:     Rate and Rhythm: Normal rate and regular rhythm.  Pulmonary:     Effort: Pulmonary effort is normal.     Breath sounds: Normal breath sounds.  Abdominal:     Palpations: Abdomen is soft.  Musculoskeletal:        General: Normal range of motion.  Skin:    General: Skin is warm and dry.  Neurological:     General: No focal deficit present.     Mental Status: He is alert and oriented to person, place, and time.          Scheduled Medications:   folic acid  1 mg Oral Daily   heparin injection (subcutaneous)  5,000 Units Subcutaneous Q8H   insulin aspart  0-15 Units Subcutaneous TID WC   insulin aspart  0-5 Units Subcutaneous QHS   multivitamin with minerals  1 tablet Oral Daily   thiamine  100 mg Oral Daily   Or   thiamine  100 mg Intravenous Daily    Continuous Infusions:  ceFEPime (MAXIPIME) IV 2 g (10/08/22 0507)   vancomycin 1,750 mg (10/08/22 1021)    PRN Medications:  acetaminophen **OR** acetaminophen, LORazepam **OR** LORazepam, nicotine, ondansetron **OR** ondansetron (ZOFRAN) IV  Antimicrobials from admission:  Anti-infectives (From admission, onward)    Start     Dose/Rate Route Frequency Ordered Stop   10/07/22 1000  vancomycin (VANCOREADY) IVPB 1750 mg/350 mL       See Hyperspace for full Linked Orders Report.   1,750 mg 175 mL/hr over 120 Minutes Intravenous Every 12 hours 10/06/22 2002     10/06/22 2015  ceFEPIme (MAXIPIME) 2 g in sodium chloride 0.9 % 100 mL IVPB        2 g 200 mL/hr over 30 Minutes Intravenous Every 8 hours 10/06/22 2002     10/06/22 2015  vancomycin (VANCOREADY) IVPB 2000 mg/400 mL       See Hyperspace for full Linked Orders Report.   2,000 mg 200 mL/hr over 120 Minutes Intravenous  Once 10/06/22 2002  10/07/22 0052           Data Reviewed:  I have personally reviewed the following...  CBC: Recent Labs  Lab 10/06/22 1555 10/07/22 0520  WBC 4.8 5.9  NEUTROABS 3.0  --   HGB 12.6* 11.3*  HCT 36.7* 33.1*  MCV 109.6* 107.1*  PLT 264 [id number]   Basic Metabolic Panel: Recent Labs  Lab 10/06/22 1555 10/07/22 0520  NA 131* 135  K 4.2 4.2  CL 97* 104  CO2 22 23  GLUCOSE 105* 87  BUN <5* 6*  CREATININE 0.64 0.59*  CALCIUM 8.1* 8.1*   GFR: Estimated Creatinine Clearance: 126.1 mL/min (A) (by C-G  formula based on SCr of 0.59 mg/dL (L)). Liver Function Tests: Recent Labs  Lab 10/06/22 1555  AST 75*  ALT 36  ALKPHOS 109  BILITOT 0.6  PROT 6.5  ALBUMIN 3.0*   No results for input(s): "LIPASE", "AMYLASE" in the last 168 hours. Recent Labs  Lab 10/06/22 1926  AMMONIA 33   Coagulation Profile: No results for input(s): "INR", "PROTIME" in the last 168 hours. Cardiac Enzymes: No results for input(s): "CKTOTAL", "CKMB", "CKMBINDEX", "TROPONINI" in the last 168 hours. BNP (last 3 results) No results for input(s): "PROBNP" in the last 8760 hours. HbA1C: Recent Labs    10/07/22 0520  HGBA1C 5.0   CBG: Recent Labs  Lab 10/07/22 0819 10/07/22 1202 10/07/22 1713 10/07/22 2126 10/08/22 0814  GLUCAP 98 95 106* 104* 97   Lipid Profile: No results for input(s): "CHOL", "HDL", "LDLCALC", "TRIG", "CHOLHDL", "LDLDIRECT" in the last 72 hours. Thyroid Function Tests: Recent Labs    10/06/22 1550  TSH 0.857   Anemia Panel: Recent Labs    10/06/22 1550 10/06/22 2054  VITAMINB12  --  253  FOLATE 9.5  --    Most Recent Urinalysis On File:     Component Value Date/Time   COLORURINE YELLOW (A) 10/07/2022 0040   APPEARANCEUR HAZY (A) 10/07/2022 0040   LABSPEC 1.019 10/07/2022 0040   PHURINE 5.0 10/07/2022 0040   GLUCOSEU NEGATIVE 10/07/2022 0040   HGBUR NEGATIVE 10/07/2022 0040   BILIRUBINUR NEGATIVE 10/07/2022 0040   KETONESUR NEGATIVE 10/07/2022 0040    PROTEINUR NEGATIVE 10/07/2022 0040   NITRITE POSITIVE (A) 10/07/2022 0040   LEUKOCYTESUR LARGE (A) 10/07/2022 0040   Sepsis Labs: @LABRCNTIP$ (procalcitonin:4,lacticidven:4) Microbiology: Recent Results (from the past 240 hour(s))  Culture, blood (Routine X 2) w Reflex to ID Panel     Status: None (Preliminary result)   Collection Time: 10/06/22  8:54 PM   Specimen: BLOOD LEFT ARM  Result Value Ref Range Status   Specimen Description BLOOD LEFT ARM  Final   Special Requests   Final    BOTTLES DRAWN AEROBIC AND ANAEROBIC Blood Culture results may not be optimal due to an excessive volume of blood received in culture bottles   Culture   Final    NO GROWTH 2 DAYS Performed at [facility], [address], [place], [place] [address]    Report Status PENDING  Incomplete  Culture, blood (Routine X 2) w Reflex to ID Panel     Status: None (Preliminary result)   Collection Time: 10/06/22  8:59 PM   Specimen: BLOOD RIGHT HAND  Result Value Ref Range Status   Specimen Description BLOOD RIGHT HAND  Final   Special Requests   Final    BOTTLES DRAWN AEROBIC AND ANAEROBIC Blood Culture adequate volume   Culture   Final    NO GROWTH 2 DAYS Performed at [facility], [address], [place], [place] [address]    Report Status PENDING  Incomplete  Urine Culture (for pregnant, neutropenic or urologic patients or patients with an indwelling urinary catheter)     Status: Abnormal   Collection Time: 10/07/22 12:40 AM   Specimen: Urine, Random  Result Value Ref Range Status   Specimen Description   Final    URINE, RANDOM Performed at [facility], [address] [place], [place] [address]    Special Requests   Final    NONE Performed at [facility], [address], [place], [place] [address]    Culture [place],  SUGGEST RECOLLECTION (A)  Final   Report Status 10/08/2022 FINAL  Final      Radiology Studies last 3 days: US ARTERIAL ABI  (SCREENING LOWER EXTREMITY)  Result Date: 10/07/2022 CLINICAL DATA:  Diabetic foot ulcer. Prior bilateral toe amputation. Current smoker. Diabetic. EXAM: NONINVASIVE PHYSIOLOGIC VASCULAR STUDY OF BILATERAL LOWER EXTREMITIES TECHNIQUE: Evaluation of both lower extremities were performed at rest, including calculation of ankle-brachial indices with single level pressure measurements and doppler recording. COMPARISON:  None available. FINDINGS: Right ABI:  1.03 Left ABI:  1.10 Right Lower Extremity: Monophasic waveform seen in the dorsalis pedis artery. Posterior tibial artery is multiphasic. Left Lower Extremity:  Normal arterial waveforms at the ankle. 1.0-1.4 Normal IMPRESSION: No definitive evidence of significant lower extremity arterial occlusive disease. Electronically Signed   By: Miachel Roux M.D.   On: 10/07/2022 12:01   MR FOOT RIGHT W WO CONTRAST  Result Date: 10/07/2022 CLINICAL DATA:  Prior amputation. Diabetic foot ulcer evaluate for osteomyelitis EXAM: MRI OF THE RIGHT FOREFOOT WITHOUT AND WITH CONTRAST TECHNIQUE: Multiplanar, multisequence MR imaging of the right foot was performed before and after the administration of intravenous contrast. CONTRAST:  66m GADAVIST GADOBUTROL 1 MMOL/ML IV SOLN COMPARISON:  None Available. FINDINGS: Bones/Joint/Cartilage No acute fracture or dislocation. Prior amputation of the second metatarsal head and phalanx. Prior amputation of the distal third of the third metatarsal and proximal phalanx. Normal alignment. No joint effusion. No marrow signal abnormality. No bone destruction or periosteal reaction. Hallux valgus. Moderate osteoarthritis of the first MTP joint. Ligaments Collateral ligaments are intact.  Lisfranc ligament is intact. Muscles and Tendons Flexor, peroneal and extensor compartment tendons are intact. Diffuse muscle atrophy. Soft tissue No fluid collection or hematoma. No soft tissue mass. Diffuse soft tissue edema along the dorsal aspect of the  foot with a soft tissue wound along the dorsal medial midfoot and associated knee enhancement concerning for cellulitis. IMPRESSION: 1. Diffuse soft tissue edema along the dorsal aspect of the foot with a soft tissue wound along the dorsal medial midfoot and associated knee enhancement concerning for cellulitis. No drainable fluid collection to suggest an abscess. 2. No osteomyelitis of the right foot. Electronically Signed   By: HKathreen DevoidM.D.   On: 10/07/2022 09:07   UK[place]ABDOMEN LIMITED RUQ (LIVER/GB)  Result Date: 10/06/2022 CLINICAL DATA:  Abdomen pain abnormal LFT EXAM: ULTRASOUND ABDOMEN LIMITED RIGHT UPPER QUADRANT COMPARISON:  None Available. FINDINGS: Gallbladder: Contracted gallbladder. No shadowing stones. Upper normal wall thickness. Negative sonographic Murphy Common bile duct: Diameter: 3.4 mm Liver: Echogenic liver parenchyma. No focal hepatic abnormality. Portal vein is patent on color Doppler imaging with normal direction of blood flow towards the liver. Other: None. IMPRESSION: Negative for gallstones or biliary dilatation. Echogenic liver parenchyma consistent with hepatic steatosis. Electronically Signed   By: KDonavan FoilM.D.   On: 10/06/2022 20:53   CT Head Wo Contrast  Result Date: 10/06/2022 CLINICAL DATA:  Delirium, memory loss EXAM: CT HEAD WITHOUT CONTRAST TECHNIQUE: Contiguous axial images were obtained from the base of the skull through the vertex without intravenous contrast. RADIATION DOSE REDUCTION: This exam was performed according to the departmental dose-optimization program which includes automated exposure control, adjustment of the mA and/or kV according to patient size and/or use of iterative reconstruction technique. COMPARISON:  11/28/2021 FINDINGS: Brain: No evidence of acute infarction, hemorrhage, extra-axial collection, ventriculomegaly, or mass effect. Tiny old left basal ganglia lacunar infarct. Generalized cerebral atrophy. Periventricular white matter low  attenuation likely secondary to microangiopathy.  Vascular: Cerebrovascular atherosclerotic calcifications are noted. Skull: Negative for fracture or focal lesion. Sinuses/Orbits: Visualized portions of the orbits are unremarkable. Visualized portions of the paranasal sinuses are unremarkable. Visualized portions of the mastoid air cells are unremarkable. Other: None. IMPRESSION: 1. No acute intracranial findings. 2. Chronic small vessel ischemic changes. Electronically Signed   By: Kathreen Devoid M.D.   On: 10/06/2022 18:21   DG Chest Port 1 View  Result Date: 10/06/2022 CLINICAL DATA:  Weakness. EXAM: PORTABLE CHEST 1 VIEW COMPARISON:  11/27/2021 chest radiograph FINDINGS: The cardiomediastinal silhouette is unremarkable. There is no evidence of focal airspace disease, pulmonary edema, suspicious pulmonary nodule/mass, pleural effusion, or pneumothorax. No acute bony abnormalities are identified. IMPRESSION: No active disease. Electronically Signed   By: Margarette Canada M.D.   On: 10/06/2022 17:54             LOS: 2 days      Emeterio Reeve, DO [facility] 10/08/2022, 11:12 AM    Dictation software may have been used to generate the above note. Typos may occur and escape review in typed/dictated notes. Please contact Dr Sheppard Coil directly for clarity if needed.  Staff may message me via secure chat in [place]  but this may not receive an immediate response,  please page me for urgent matters!  If 7PM-7AM, please contact night coverage [email or web address]

## 2022-10-08 NOTE — Evaluation (Signed)
Occupational Therapy Evaluation Patient Details Name: [name] [name] MRN: [id number] DOB: September 13, 1960 Today's Date: 10/08/2022   History of Present Illness Pt is a 62 year old malepresents emergency department for chief concerns of altered mental status from family for 2 weeks, admitted with UTI, Diabetic foot ulcer ([place]) , Cellulitis R foot; PMH significant for chronic alcohol use, history of chronic osteomyelitis, tobacco use, history of cocaine use,   Clinical Impression   Chart reviewed, pt greeted in room agreeable to OT evaluation. RN ok'd evaluation. Pt is alert and oriented x4, however participated in SLUMS assessment scoring 15/30 with deficits noted in delayed recall,  Numeric calculation and registration, and Executive function plus extrapolation. Processing and safety deficits noted during functional activities. Bed mobility completed with supervision, STS with CGA with RW, amb in room with CGA-MIN A with one posterior LOB requiring MIN A to correct. Muti modal cueing required for safety. LB dressing completed with MIN A. Pt presents with deficits in activity tolerance, balance, cognition affecting safe and optimal ADL completion. Recommend discharge with [place] and supervision for medication management,assist with IADLs. If pt does not have supervision and progress is not made to improve mobility, would consider STR. OT will continue to follow acutley.    Recommendations for follow up therapy are one component of a multi-disciplinary discharge planning process, led by the attending physician.  Recommendations may be updated based on patient status, additional functional criteria and insurance authorization.   Follow Up Recommendations  Home health OT;     Assistance Recommended at Discharge Frequent or constant Supervision/Assistance  Patient can return home with the following A little help with walking and/or transfers;A little help with bathing/dressing/bathroom;Direct  supervision/assist for financial management;Direct supervision/assist for medications management;Help with stairs or ramp for entrance    Functional Status Assessment  Patient has had a recent decline in their functional status and demonstrates the ability to make significant improvements in function in a reasonable and predictable amount of time.  Equipment Recommendations  BSC/3in1;Other (comment)    Recommendations for Other Services       Precautions / Restrictions Precautions Precautions: Fall Restrictions Weight Bearing Restrictions: Yes RLE Weight Bearing: Weight bearing as tolerated Other Position/Activity Restrictions: in surgical shoe per podiatry      Mobility Bed Mobility Overal bed mobility: Needs Assistance Bed Mobility: Supine to Sit     Supine to sit: Supervision, HOB elevated          Transfers Overall transfer level: Needs assistance Equipment used: Rolling walker (2 wheels) Transfers: Sit to/from Stand Sit to Stand: Min guard                  Balance Overall balance assessment: Needs assistance Sitting-balance support: Feet supported Sitting balance-Leahy Scale: Good     Standing balance support: Bilateral upper extremity supported, During functional activity, Reliant on assistive device for balance Standing balance-Leahy Scale: Fair Standing balance comment: one posterior LOB                           ADL either performed or assessed with clinical judgement   ADL Overall ADL's : Needs assistance/impaired     Grooming: Wash/dry face;Sitting;Supervision/safety               Lower Body Dressing: Minimal assistance Lower Body Dressing Details (indicate cue type and reason): sock, post op shoe Toilet Transfer: Min guard;Minimal assistance;Rolling walker (2 wheels);Ambulation Toilet Transfer Details (indicate cue type and reason):  simulation Toileting- Clothing Manipulation and Hygiene: Minimal assistance;Sit to/from  stand       Functional mobility during ADLs: Min guard;Minimal assistance;Rolling walker (2 wheels) (one posterior LOB requiring MIN A to correct, approx 20' in room)       Vision Patient Visual Report: No change from baseline       Perception     Praxis      Pertinent Vitals/Pain Pain Assessment Pain Assessment: No/denies pain     Hand Dominance     Extremity/Trunk Assessment Upper Extremity Assessment Upper Extremity Assessment: Overall WFL for tasks assessed   Lower Extremity Assessment Lower Extremity Assessment: Overall WFL for tasks assessed   Cervical / Trunk Assessment Cervical / Trunk Assessment: Normal   Communication Communication Communication: No difficulties   Cognition Arousal/Alertness: Awake/alert Behavior During Therapy: Flat affect Overall Cognitive Status: No family/caregiver present to determine baseline cognitive functioning Area of Impairment: Orientation, Attention, Memory, Following commands, Safety/judgement, Awareness, Problem solving                 Orientation Level: Disoriented to, Situation Current Attention Level: Selective Memory: Decreased short-term memory Following Commands: Follows one step commands with increased time Safety/Judgement: Decreased awareness of safety, Decreased awareness of deficits Awareness: Emergent Problem Solving: Slow processing, Requires verbal cues, Requires tactile cues General Comments: Pt participates in SLUMS assessment scoring 15/30 with deficits noted in delayed recall,  Numeric calculation and registration, and Executive function plus extrapolation     General Comments  HR WNL after mobility    Exercises Other Exercises Other Exercises: edu re: role of OT, role of rehab, discharge recommendations, home safety, falls prevention   Shoulder Instructions      Home Living Family/patient expects to be discharged to:: Private residence Living Arrangements: Other relatives  (brother) Available Help at Discharge: Family;Available PRN/intermittently Type of Home: House Home Access: Stairs to enter Entrance Stairs-Number of Steps: 3   Home Layout: One level               Home Equipment: Conservation officer, nature (2 wheels);Cane - single point          Prior Functioning/Environment Prior Level of Function : Independent/Modified Independent             Mobility Comments: use of SPC ADLs Comments: pt reports MOD I in ADL/IADL, questionable if pt has been managing approrpaitely prior to discharge        OT Problem List: Decreased strength;Decreased activity tolerance;Impaired balance (sitting and/or standing);Decreased cognition;Decreased knowledge of precautions;Decreased knowledge of use of DME or AE      OT Treatment/Interventions: Self-care/ADL training;Patient/family education;Therapeutic exercise;Balance training;Energy conservation;Therapeutic activities;DME and/or AE instruction;Cognitive remediation/compensation    OT Goals(Current goals can be found in the care plan section) Acute Rehab OT Goals Patient Stated Goal: get stronger OT Goal Formulation: With patient Time For Goal Achievement: 10/22/22 Potential to Achieve Goals: Good ADL Goals Pt Will Perform Grooming: with modified independence;standing Pt Will Perform Lower Body Dressing: with modified independence Pt Will Transfer to Toilet: with modified independence;ambulating Pt Will Perform Toileting - Clothing Manipulation and hygiene: with modified independence;sit to/from stand  OT Frequency: Min 2X/week    Co-evaluation              AM-PAC OT "6 Clicks" Daily Activity     Outcome Measure Help from another person eating meals?: None Help from another person taking care of personal grooming?: A Little Help from another person toileting, which includes using toliet, bedpan, or urinal?: A Little  Help from another person bathing (including washing, rinsing, drying)?: A  Little Help from another person to put on and taking off regular upper body clothing?: None Help from another person to put on and taking off regular lower body clothing?: A Little 6 Click Score: 20   End of Session Equipment Utilized During Treatment: Rolling walker (2 wheels) Nurse Communication: Mobility status  Activity Tolerance: Patient tolerated treatment well Patient left: in chair;with call bell/phone within reach;with chair alarm set  OT Visit Diagnosis: Unsteadiness on feet (R26.81);Muscle weakness (generalized) (M62.81);Other abnormalities of gait and mobility (R26.89)                Time: [id number] OT Time Calculation (min): 18 min Charges:  OT General Charges $OT Visit: 1 Visit OT Evaluation $OT Eval Moderate Complexity: 1 Mod  Shanon Payor, OTD OTR/L  10/08/22, 3:28 PM

## 2022-10-08 NOTE — TOC Initial Note (Signed)
Transition of Care [facility]) - Initial/Assessment Note    Patient Details  Name: [name] [name] MRN: [id number] Date of Birth: Jan 08, 1961  Transition of Care [facility]) CM/SW Contact:    Valente David, RN Phone Number: 10/08/2022, 4:43 PM  Clinical Narrative:                  Patient admitted from home where he lives with brother.  State he will provide transportation home when ready for discharge.  Recommendations for HHPT, however patient does not have PCP that can cover his care.  He will have his brother call tomorrow to schedule new patient office visit with a PCP.  TOC team will follow up and attempt to secure [facility] tomorrow.  Recommendations also for substance abuse resources, patient declined.   Expected Discharge Plan: [place] Barriers to Discharge: Continued Medical Work up   Patient Goals and CMS Choice            Expected Discharge Plan and Services       Living arrangements for the past 2 months: Single Family Home                                      Prior Living Arrangements/Services Living arrangements for the past 2 months: Single Family Home Lives with:: Siblings                   Activities of Daily Living Home Assistive Devices/Equipment: Cane (specify quad or straight) ADL Screening (condition at time of admission) Patient's cognitive ability adequate to safely complete daily activities?: No Is the patient deaf or have difficulty hearing?: No Does the patient have difficulty seeing, even when wearing glasses/contacts?: Yes Does the patient have difficulty concentrating, remembering, or making decisions?: Yes Patient able to express need for assistance with ADLs?: Yes Does the patient have difficulty dressing or bathing?: Yes Independently performs ADLs?: No Communication: Independent Dressing (OT): Needs assistance Is this a change from baseline?: Pre-admission baseline Grooming: Independent Feeding: Independent Bathing:  Needs assistance Is this a change from baseline?: Pre-admission baseline Toileting: Needs assistance Is this a change from baseline?: Pre-admission baseline In/Out Bed: Needs assistance Is this a change from baseline?: Pre-admission baseline Walks in Home: Needs assistance Is this a change from baseline?: Pre-admission baseline Does the patient have difficulty walking or climbing stairs?: Yes Weakness of Legs: Both Weakness of Arms/Hands: None  Permission Sought/Granted                  Emotional Assessment              Admission diagnosis:  Altered mental status [R41.82] Disorientation [R41.0] Abnormal transaminases [R74.8] Ulcer of right foot, limited to breakdown of skin ([place]) [L97.511] Patient Active Problem List   Diagnosis Date Noted   Hepatic steatosis 10/07/2022   Altered mental status 10/06/2022   Hyponatremia 10/06/2022   Alcohol abuse 10/06/2022   Sepsis ([place]) 06/29/2021   Acute osteomyelitis of metatarsal bone of left foot ([place])    Diabetic infection of left foot ([place])    Diabetic foot ulcer ([place]) 01/11/2021   Tobacco use disorder    Peripheral vascular disease (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   PCP:  Merryl Hacker, No Pharmacy:   [facility], [place], [place] [address] Phone: [phone] Fax: [place], [place] - [address] [place] [address] Phone: [phone] Fax: [place]  [address] Phone: [phone] Fax: [phone]     Social Determinants of Health (SDOH) Social History: [place]: Food Insecurity Present (10/07/2022)  Housing: Low Risk  (10/07/2022)  Transportation Needs: No Transportation Needs (10/07/2022)  Utilities: Not At Risk (10/07/2022)  Tobacco Use: High Risk (10/06/2022)   SDOH Interventions:     Readmission Risk Interventions     No data to display

## 2022-10-08 NOTE — Consult Note (Signed)
WOC Nurse Consult Note: Reason for Consult:Right foot neuropathic lesions, anterior foot with injury, infectious vs trauma. Photodocumentation provided by EDP. Wound type:infectious Pressure Injury POA: N/A Measurement:To be provided by Bedside RN today with next dressing change and documented on Nursing Flow Sheet Wound bed:See photo Drainage (amount, consistency, odor) small, serosanguinous Periwound:discolored with more deeply pigmented tissues Dressing procedure/placement/frequency: I have corresponded with Dr. Sheppard Coil via [place] and recommended Podiatric Medicine consult.  Conservative care orders are provided in the meantime, using a soap and water cleanse, rinse and dry followed by covering the lesions with a xeroform guaze, topping with dry gauze and securing with a few turns of Kerlix roll gauze/paper tape. Foot is to be placed into a Prevalon boot.  [place] nursing team will not follow, but will remain available to this patient, the nursing and medical teams.  Please re-consult if needed.  Thank you for inviting [place] to participate in this patient's Plan of Care.  Maudie Flakes, MSN, RN, CNS, [place], Serita Grammes, [employer], [employer] phone:  [phone]

## 2022-10-08 NOTE — Evaluation (Signed)
Physical Therapy Evaluation Patient Details Name: [name] [name] MRN: [id number] DOB: 1960/09/03 Today's Date: 10/08/2022  History of Present Illness  [name] is a 62 year old male with chronic alcohol use, history of chronic osteomyelitis, tobacco use, history of cocaine use, who presents emergency department for chief concerns of altered mental status from family for 2 weeks.  Clinical Impression  Patient received in bed, he is agreeable to PT assessment. Patient is mod I with bed mobility. Transfers with min guard and ambulated 30 feet with RW and min guard. No lob. Does not use RW at baseline but states he has one. Patient lives alone. He will continue to benefit from skilled PT to improve functional independence, strength and safety with mobility.         Recommendations for follow up therapy are one component of a multi-disciplinary discharge planning process, led by the attending physician.  Recommendations may be updated based on patient status, additional functional criteria and insurance authorization.  Follow Up Recommendations Home health PT      Assistance Recommended at Discharge PRN  Patient can return home with the following  A little help with walking and/or transfers;Help with stairs or ramp for entrance;Assist for transportation    Equipment Recommendations None recommended by PT (states he has a walker)  Recommendations for Other Services       Functional Status Assessment Patient has had a recent decline in their functional status and demonstrates the ability to make significant improvements in function in a reasonable and predictable amount of time.     Precautions / Restrictions Precautions Precautions: Fall Restrictions Weight Bearing Restrictions: No      Mobility  Bed Mobility Overal bed mobility: Independent                  Transfers Overall transfer level: Needs assistance Equipment used: Rolling walker (2 wheels) Transfers: Sit  to/from Stand Sit to Stand: Min guard                Ambulation/Gait Ambulation/Gait assistance: Min guard Gait Distance (Feet): 30 Feet Assistive device: Rolling walker (2 wheels) Gait Pattern/deviations: Step-through pattern Gait velocity: slightly decreased     General Gait Details: patient ambulated out to hallway, min guard.  Stairs            Wheelchair Mobility    Modified Rankin (Stroke Patients Only)       Balance Overall balance assessment: Needs assistance Sitting-balance support: Feet supported Sitting balance-Leahy Scale: Good     Standing balance support: Bilateral upper extremity supported, During functional activity, Reliant on assistive device for balance Standing balance-Leahy Scale: Good Standing balance comment: no lob with using walker                             Pertinent Vitals/Pain Pain Assessment Pain Assessment: No/denies pain    Home Living Family/patient expects to be discharged to:: Private residence Living Arrangements: Other relatives;Alone   Type of Home: House Home Access: Stairs to enter   [employer] of Steps: a few   Home Layout: One level Home Equipment: Conservation officer, nature (2 wheels);Cane - single point Additional Comments: states he uses cane occasionally    Prior Function Prior Level of Function : Independent/Modified Independent             Mobility Comments: cane use ADLs Comments: independent     Hand Dominance        Extremity/Trunk Assessment  Upper Extremity Assessment Upper Extremity Assessment: Overall WFL for tasks assessed    Lower Extremity Assessment Lower Extremity Assessment: Overall WFL for tasks assessed    Cervical / Trunk Assessment Cervical / Trunk Assessment: Normal  Communication   Communication: No difficulties  Cognition Arousal/Alertness: Awake/alert Behavior During Therapy: WFL for tasks assessed/performed Overall Cognitive Status: Within  Functional Limits for tasks assessed                                          General Comments      Exercises     Assessment/Plan    PT Assessment Patient needs continued PT services  PT Problem List Decreased strength;Decreased balance;Decreased mobility;Decreased activity tolerance;Decreased knowledge of use of DME       PT Treatment Interventions DME instruction;Gait training;Therapeutic exercise;Balance training;Stair training;Functional mobility training;Therapeutic activities;Patient/family education    PT Goals (Current goals can be found in the Care Plan section)  Acute Rehab PT Goals Patient Stated Goal: to return home PT Goal Formulation: With patient Time For Goal Achievement: 10/15/22 Potential to Achieve Goals: Good    Frequency Min 2X/week     Co-evaluation               AM-PAC PT "6 Clicks" Mobility  Outcome Measure Help needed turning from your back to your side while in a flat bed without using bedrails?: None Help needed moving from lying on your back to sitting on the side of a flat bed without using bedrails?: None Help needed moving to and from a bed to a chair (including a wheelchair)?: A Little Help needed standing up from a chair using your arms (e.g., wheelchair or bedside chair)?: A Little Help needed to walk in hospital room?: A Little Help needed climbing 3-5 steps with a railing? : A Little 6 Click Score: 20    End of Session Equipment Utilized During Treatment: Gait belt Activity Tolerance: Patient tolerated treatment well Patient left: in chair;with call bell/phone within reach Nurse Communication: Mobility status PT Visit Diagnosis: Other abnormalities of gait and mobility (R26.89);Muscle weakness (generalized) (M62.81);Difficulty in walking, not elsewhere classified (R26.2);Unsteadiness on feet (R26.81)    Time: [id number] PT Time Calculation (min) (ACUTE ONLY): 12 min   Charges:   PT Evaluation $PT Eval Low  Complexity: 1 Low          Caliph Borowiak, PT, GCS 10/08/22,10:03 AM

## 2022-10-08 NOTE — Consult Note (Signed)
PODIATRY CONSULTATION  NAME [name] [name] MRN [id number] DOB 1961-06-07 DOA 10/06/2022   Reason for consult: Diabetic foot ulcer right foot Chief Complaint  Patient presents with   Extremity Weakness   Weakness    Patient c/o weakness x 1 week and states that he "doesn't feel right"; Son also reports intermittent episodes of memory loss that have been having for approx. 3 months and yellowed sclera; Patient admits to drinking earlier today, he has urinated on himself and is Engineer, mining physician: Emeterio Reeve, [facility]  History of present illness: 62 y.o. male PMHx diabetes mellitus A1c 5.0 on 10/07/2022 with reported chronic osteomyelitis LT foot, history of prior amputations bilateral feet, admitted for AMS reported by the family duration 2 weeks.  Found to have a new ulcer to the dorsum of the right foot.  Patient unaware of the history of the ulcer.  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[place])        Latest Ref Rng & Units 10/07/2022    5:20 AM 10/06/2022    3:55 PM 11/27/2021   11:37 PM  CBC  WBC 4.0 - 10.5 K/uL 5.9  4.8  6.9   Hemoglobin 13.0 - 17.0 g/dL 11.3  12.6  13.6   Hematocrit 39.0 - 52.0 % 33.1  36.7  41.2   Platelets 150 - 400 K/uL 219  264  198        Latest Ref Rng & Units 10/07/2022    5:20 AM 10/06/2022    3:55 PM 11/27/2021   11:37 PM  BMP  Glucose 70 - 99 mg/dL 87  105  116   BUN 8 - 23 mg/dL 6  <5  9   Creatinine 0.61 - 1.24 mg/dL 0.59  0.64  0.84   Sodium 135 - 145 mmol/L 135  131  133   Potassium 3.5 - 5.1 mmol/L 4.2  4.2  3.0   Chloride 98 - 111 mmol/L 104  97  96   CO2 22 - 32 mmol/L 23  22  20   $ Calcium 8.9 - 10.3 mg/dL 8.1  8.1  9.3       Physical Exam: General: The patient is alert and oriented x3 in no acute distress.   Dermatology: Ulcer noted dorsum of the right foot with  granular and fibrotic wound base.  Serous drainage.  No purulence.  Appears to be stable  Vascular: US ARTERIAL ABI 10/07/2022 FINDINGS: Right ABI:  1.03 Left ABI:  1.10   Right Lower Extremity: Monophasic waveform seen in the dorsalis pedis artery. Posterior tibial artery is multiphasic.   Left Lower Extremity:  Normal arterial waveforms at the ankle.   1.0-1.4 Normal   IMPRESSION: No definitive evidence of significant lower extremity arterial occlusive disease.  Neurological: Diminished via light touch  Musculoskeletal Exam: History of prior amputations second and third digit of the right foot as well as partial first ray amputation left foot  MR FOOT RIGHT W WO CONTRAST 10/06/2022 IMPRESSION: 1. Diffuse soft tissue edema along the dorsal aspect of the foot with a soft tissue wound along the dorsal medial midfoot and associated knee enhancement concerning for cellulitis. No drainable fluid collection to suggest an abscess. 2. No osteomyelitis of the right foot.   ASSESSMENT/PLAN OF CARE Dorsal foot ulcer right -The wound appears  very stable.  No plans or indications for surgical intervention. -Orders placed for Aquacel Ag with padded bandage daily.  -WBAT surgical shoe.  Order placed for a postoperative surgical shoe -Recommend outpatient follow-up at the [facility] wound care center. -Podiatry will sign off      Thank you for the consult.  Please contact me directly via secure chat with any questions or concerns.     Edrick Kins, DPM [facility]  Dr. Edrick Kins, DPM    [address],  [address]                Office [phone]  Fax [phone]

## 2022-10-09 ENCOUNTER — Other Ambulatory Visit: Payer: Self-pay

## 2022-10-09 DIAGNOSIS — D638 Anemia in other chronic diseases classified elsewhere: Secondary | ICD-10-CM | POA: Diagnosis not present

## 2022-10-09 DIAGNOSIS — K760 Fatty (change of) liver, not elsewhere classified: Secondary | ICD-10-CM | POA: Diagnosis not present

## 2022-10-09 DIAGNOSIS — R404 Transient alteration of awareness: Secondary | ICD-10-CM | POA: Diagnosis not present

## 2022-10-09 DIAGNOSIS — F1010 Alcohol abuse, uncomplicated: Secondary | ICD-10-CM | POA: Diagnosis not present

## 2022-10-09 LAB — GLUCOSE, CAPILLARY
Glucose-Capillary: 90 mg/dL (ref 70–99)
Glucose-Capillary: 100 mg/dL — ABNORMAL HIGH (ref 70–99)

## 2022-10-09 MED ORDER — SULFAMETHOXAZOLE-TRIMETHOPRIM 800-160 MG PO TABS
2.0000 | ORAL_TABLET | Freq: Two times a day (BID) | ORAL | 0 refills | Status: AC
  Filled 2022-10-09: qty 20, 5d supply, fill #0

## 2022-10-09 MED ORDER — ADULT MULTIVITAMIN W/MINERALS CH
1.0000 | ORAL_TABLET | Freq: Every day | ORAL | 0 refills | Status: AC
  Filled 2022-10-09: qty 30, 30d supply, fill #0

## 2022-10-09 MED ORDER — "AQUACEL AG FOAM 8""X8"" EX PADS"
MEDICATED_PAD | CUTANEOUS | 1 refills | Status: AC
  Filled 2022-10-09: qty 15, fill #0

## 2022-10-09 MED ORDER — VITAMIN B-1 100 MG PO TABS
100.0000 mg | ORAL_TABLET | Freq: Every day | ORAL | 0 refills | Status: AC
  Filled 2022-10-09: qty 26, 26d supply, fill #0

## 2022-10-09 MED ORDER — FOLIC ACID 1 MG PO TABS
1.0000 mg | ORAL_TABLET | Freq: Every day | ORAL | 0 refills | Status: AC
  Filled 2022-10-09: qty 30, 30d supply, fill #0

## 2022-10-09 NOTE — Care Management Important Message (Signed)
Important Message  Patient Details  Name: [name] [name] MRN: [id number] Date of Birth: 12-Jan-1961   Medicare Important Message Given:  Yes     Dannette Barbara 10/09/2022, 11:37 AM

## 2022-10-09 NOTE — TOC Transition Note (Signed)
Transition of Care [facility]) - CM/SW Discharge Note   Patient Details  Name: [name] [name] MRN: [id number] Date of Birth: 04-13-1961  [facility]) CM/SW Contact:  Gerilyn Pilgrim, LCSW Phone Number: 10/09/2022, 12:09 PM   Clinical Narrative:   Pt has orders to discharge. Referral placed for podiatry and wound clinic. Pt unable to get [facility] at discharge due to not having a PCP but brother states he will work on getting pt into care with a new PCP. CSW signing off.     Final next level of care: Home/Self Care Barriers to Discharge: Barriers Resolved   Patient Goals and CMS Choice      Discharge Placement                         Discharge Plan and Services Additional resources added to the After Visit Summary for                                       Social Determinants of Health (SDOH) Interventions SDOH Screenings   Food Insecurity: Food Insecurity Present (10/07/2022)  Housing: Low Risk  (10/07/2022)  Transportation Needs: No Transportation Needs (10/07/2022)  Utilities: Not At Risk (10/07/2022)  Tobacco Use: High Risk (10/06/2022)     Readmission Risk Interventions     No data to display

## 2022-10-09 NOTE — Plan of Care (Signed)

## 2022-10-09 NOTE — Progress Notes (Addendum)
Patient discharged home. All belongings sent home with patient. Discharge instructions provided as well as wound care. Peripheral IV removed. Wound care supplies and medications provided to patient before discharge.

## 2022-10-09 NOTE — Discharge Summary (Signed)
Physician Discharge Summary   Patient: [name] [name] MRN: [id number]  DOB: 1960/11/26   Admit:     Date of Admission: 10/06/2022 Admitted from: home   Discharge: Date of discharge: 10/09/22 Disposition: Home Condition at discharge: good  CODE STATUS: FULL CODE     Discharge Physician: Emeterio Reeve, DO [facility]     PCP: Pcp, No  Recommendations for Outpatient Follow-up:  Follow up with PCP ASAP to establish Referrals placed to wound care and to podiatry  Please obtain labs/tests: CBC, BMP, UA at next appointment  Please follow up on the following pending results: final culture results  PCP AND OTHER OUTPATIENT PROVIDERS: SEE [place] [facility] PATIENT INFO    Discharge Instructions     AMB referral to wound care center   Complete by: As directed    Ambulatory referral to Podiatry   Complete by: As directed    Diet - low sodium heart healthy   Complete by: As directed    Discharge wound care:   Complete by: As directed    Apply aquacel ag directly to wound. Cover with padded mepilex bandage/bandaid. Follow w/ podiatry and wound care center   Increase activity slowly   Complete by: As directed          Discharge Diagnoses: Principal Problem:   Altered mental status Active Problems:   Anemia of chronic disease   Tobacco abuse   Acute osteomyelitis of toe of right foot (HCC)   Diabetic foot ulcer ([place])   Hyponatremia   Alcohol abuse   Hepatic steatosis       Hospital Course: [name] is a 62 year old male with chronic alcohol use, history of chronic osteomyelitis, tobacco use, history of cocaine use, who presents emergency department for chief concerns of altered mental status from family for 2 weeks. 02/09: tachycardia, tachypnea. Mild hyponatremia. Macrocytosis w/ mild anemia. UA (+)WBC< nitrite. UCS (+)cocaine metabs. Started abx for diabetic foot ulcer  and UTI. RR 22, HR 110, meets SIRS criteria. Concern for sepsis w/ UTI and infected diabetic foot ulcer therefore meets sepsis criteria. CT head, CXR no concerns. MR foot ordered. US abdomen  02/10: SIRS/sepsis parameters resolved as of today. Pending UCx, BCx. MRI resutls for foot as below, (+)cellulitis, no osteomyelitis. ABI No definitive evidence of significant lower extremity arterial occlusive disease. 02/11: UCx multiple species, suggest recollection - has been on abx and improving, will continue as below, repeat UA in AM. Podiatry to see today. BCx NGx2d   02/12: Podiatry saw him yesterday, ok to follow outpatient.    MRI R foot Diffuse soft tissue edema along the dorsal aspect of the foot with a soft tissue wound along the dorsal medial midfoot and associated knee enhancement concerning for cellulitis. No drainable fluid collection to suggest an abscess. 2. No osteomyelitis of the right foot.     Consultants:  Podiatry   Procedures: none      ASSESSMENT & PLAN:   Principal Problem:   Altered mental status Active Problems:   Anemia of chronic disease   Tobacco abuse   Acute osteomyelitis of toe of right foot (HCC)   Diabetic foot ulcer (HCC)   Hyponatremia   Alcohol abuse  Altered mental status likely d/t sepsis, UTI - improved Urinary tract infection  IV Abx vancomycin and cefepime for cellulitis should cover any urinary pathogens --> d/c on Bactrim DS higher dose given BMI Will not  repeat culture as he's already been on abx and improving, culture likely to be negative. 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 on 07/01/21, lost to follow-up after that  No osteomyelitis R foot on MRI ABI - no concerns Vancomycin and cefepime --> Bactrim which should cover MRSA and UTI BCx results NGx2d Podiatry consult - can follow outpatient Aquacel Ag with padded bandage daily  -->pt given wound care supplies  WBAT surgical shoe   Alcohol abuse CIWA protocol Vitamin/mineral supplementation   Hyponatremia Resolved Monitor BMP  Tobacco abuse As needed nicotine patch ordered   Anemia of chronic disease Macrocytic anemia, presumed secondary to chronic EtOH use Follow CBC                  Discharge Instructions  Allergies as of 10/09/2022   No Known Allergies      Medication List     TAKE these medications    Aquacel Ag Foam 8"X8" Pads Apply to foot wound as directed 1-2 times per day, cover with tape or wrap with ACE wrap   folic acid 1 MG tablet Commonly known as: FOLVITE Take 1 tablet (1 mg total) by mouth daily.   multivitamin with minerals Tabs tablet Take 1 tablet by mouth daily.   sulfamethoxazole-trimethoprim 800-160 MG tablet Commonly known as: Bactrim DS Take 2 tablets by mouth 2 (two) times daily for 5 days.   thiamine 100 MG tablet Commonly known as: Vitamin B-1 Take 1 tablet (100 mg total) by mouth daily.               Discharge Care Instructions  (From admission, onward)           Start     Ordered   10/09/22 0000  Discharge wound care:       Comments: Apply aquacel ag directly to wound. Cover with padded mepilex bandage/bandaid. Follow w/ podiatry and wound care center   10/09/22 1050              No Known Allergies   Subjective: pt feels well this morning, foot feels fine, no fever/chills    Discharge Exam: BP (!) 138/98 (BP Location: Left Arm)   Pulse 95   Temp 98.5 F (36.9 C)   Resp 18   Ht 6' 4"$  (1.93 m)   Wt 108.9 kg   SpO2 100%   BMI 29.22 kg/m  General: Pt is alert, awake, not in acute distress Cardiovascular: RRR, S1/S2 +, no rubs, no gallops Respiratory: CTA bilaterally, no wheezing, no rhonchi Abdominal: Soft, NT, ND, bowel sounds + Extremities: no edema, no cyanosis     The results of significant diagnostics from this hospitalization (including imaging,  microbiology, ancillary and laboratory) are listed below for reference.     Microbiology: Recent Results (from the past 240 hour(s))  Culture, blood (Routine X 2) w Reflex to ID Panel     Status: None (Preliminary result)   Collection Time: 10/06/22  8:54 PM   Specimen: BLOOD LEFT ARM  Result Value Ref Range Status   Specimen Description BLOOD LEFT ARM  Final   Special Requests   Final    BOTTLES DRAWN AEROBIC AND ANAEROBIC Blood Culture results may not be optimal due to an excessive volume of blood received in culture bottles   Culture   Final    NO GROWTH 3 DAYS Performed at [facility],  [place], [place] [address]    Report Status PENDING  Incomplete  Culture, blood (Routine X 2) w Reflex to ID Panel     Status: None (Preliminary result)   Collection Time: 10/06/22  8:59 PM   Specimen: BLOOD RIGHT HAND  Result Value Ref Range Status   Specimen Description BLOOD RIGHT HAND  Final   Special Requests   Final    BOTTLES DRAWN AEROBIC AND ANAEROBIC Blood Culture adequate volume   Culture   Final    NO GROWTH 3 DAYS Performed at [facility], [address], [place], [place] [address]    Report Status PENDING  Incomplete  Urine Culture (for pregnant, neutropenic or urologic patients or patients with an indwelling urinary catheter)     Status: Abnormal   Collection Time: 10/07/22 12:40 AM   Specimen: Urine, Random  Result Value Ref Range Status   Specimen Description   Final    URINE, RANDOM Performed at [facility], [address], [place], [place] [address]    Special Requests   Final    NONE Performed at [facility], [address], [place], [place] [address]    Culture MULTIPLE SPECIES PRESENT, SUGGEST RECOLLECTION (A)  Final   Report Status 10/08/2022 FINAL  Final     Labs: BNP (last 3 results) No results for input(s): "BNP" in the last 8760 hours. Basic Metabolic Panel: Recent Labs  Lab 10/06/22 1555  10/07/22 0520  NA 131* 135  K 4.2 4.2  CL 97* 104  CO2 22 23  GLUCOSE 105* 87  BUN <5* 6*  CREATININE 0.64 0.59*  CALCIUM 8.1* 8.1*   Liver Function Tests: Recent Labs  Lab 10/06/22 1555  AST 75*  ALT 36  ALKPHOS 109  BILITOT 0.6  PROT 6.5  ALBUMIN 3.0*   No results for input(s): "LIPASE", "AMYLASE" in the last 168 hours. Recent Labs  Lab 10/06/22 1926  AMMONIA 33   CBC: Recent Labs  Lab 10/06/22 1555 10/07/22 0520  WBC 4.8 5.9  NEUTROABS 3.0  --   HGB 12.6* 11.3*  HCT 36.7* 33.1*  MCV 109.6* 107.1*  PLT 264 219   Cardiac Enzymes: No results for input(s): "CKTOTAL", "CKMB", "CKMBINDEX", "TROPONINI" in the last 168 hours. BNP: Invalid input(s): "POCBNP" CBG: Recent Labs  Lab 10/08/22 1219 10/08/22 1644 10/08/22 2007 10/09/22 0745 10/09/22 1148  GLUCAP 107* 95 144* 90 100*   D-Dimer No results for input(s): "DDIMER" in the last 72 hours. Hgb A1c Recent Labs    10/07/22 0520  HGBA1C 5.0   Lipid Profile No results for input(s): "CHOL", "HDL", "LDLCALC", "TRIG", "CHOLHDL", "LDLDIRECT" in the last 72 hours. Thyroid function studies Recent Labs    10/06/22 1550  TSH 0.857   Anemia work up Recent Labs    10/06/22 1550 10/06/22 2054  VITAMINB12  --  253  FOLATE 9.5  --    Urinalysis    Component Value Date/Time   COLORURINE YELLOW (A) 10/07/2022 0040   APPEARANCEUR HAZY (A) 10/07/2022 0040   LABSPEC 1.019 10/07/2022 0040   PHURINE 5.0 10/07/2022 0040   GLUCOSEU NEGATIVE 10/07/2022 0040   HGBUR NEGATIVE 10/07/2022 0040   BILIRUBINUR NEGATIVE 10/07/2022 0040   KETONESUR NEGATIVE 10/07/2022 0040   PROTEINUR NEGATIVE 10/07/2022 0040   NITRITE POSITIVE (A) 10/07/2022 0040   LEUKOCYTESUR LARGE (A) 10/07/2022 0040   Sepsis Labs Recent Labs  Lab 10/06/22 1555 10/07/22 0520  WBC 4.8 5.9   Microbiology Recent Results (  from the past 240 hour(s))  Culture, blood (Routine X 2) w Reflex to ID Panel     Status: None (Preliminary result)    Collection Time: 10/06/22  8:54 PM   Specimen: BLOOD LEFT ARM  Result Value Ref Range Status   Specimen Description BLOOD LEFT ARM  Final   Special Requests   Final    BOTTLES DRAWN AEROBIC AND ANAEROBIC Blood Culture results may not be optimal due to an excessive volume of blood received in culture bottles   Culture   Final    NO GROWTH 3 DAYS Performed at [facility], [address] [place], [place] [address]    Report Status PENDING  Incomplete  Culture, blood (Routine X 2) w Reflex to ID Panel     Status: None (Preliminary result)   Collection Time: 10/06/22  8:59 PM   Specimen: BLOOD RIGHT HAND  Result Value Ref Range Status   Specimen Description BLOOD RIGHT HAND  Final   Special Requests   Final    BOTTLES DRAWN AEROBIC AND ANAEROBIC Blood Culture adequate volume   Culture   Final    NO GROWTH 3 DAYS Performed at [facility], [address] [place], [place] [address]    Report Status PENDING  Incomplete  Urine Culture (for pregnant, neutropenic or urologic patients or patients with an indwelling urinary catheter)     Status: Abnormal   Collection Time: 10/07/22 12:40 AM   Specimen: Urine, Random  Result Value Ref Range Status   Specimen Description   Final    URINE, RANDOM Performed at [facility], [address], [place], [place] [address]    Special Requests   Final    NONE Performed at [facility], [address] [place], [place] [address]    Culture MULTIPLE SPECIES PRESENT, SUGGEST RECOLLECTION (A)  Final   Report Status 10/08/2022 FINAL  Final   Imaging US ARTERIAL ABI (SCREENING LOWER EXTREMITY)  Result Date: 10/07/2022 CLINICAL DATA:  Diabetic foot ulcer. Prior bilateral toe amputation. Current smoker. Diabetic. EXAM: NONINVASIVE PHYSIOLOGIC VASCULAR STUDY OF BILATERAL LOWER EXTREMITIES TECHNIQUE: Evaluation of both lower extremities were performed at rest, including calculation of ankle-brachial indices with  single level pressure measurements and doppler recording. COMPARISON:  None available. FINDINGS: Right ABI:  1.03 Left ABI:  1.10 Right Lower Extremity: Monophasic waveform seen in the dorsalis pedis artery. Posterior tibial artery is multiphasic. Left Lower Extremity:  Normal arterial waveforms at the ankle. 1.0-1.4 Normal IMPRESSION: No definitive evidence of significant lower extremity arterial occlusive disease. Electronically Signed   By: Miachel Roux M.D.   On: 10/07/2022 12:01   MR FOOT RIGHT W WO CONTRAST  Result Date: 10/07/2022 CLINICAL DATA:  Prior amputation. Diabetic foot ulcer evaluate for osteomyelitis EXAM: MRI OF THE RIGHT FOREFOOT WITHOUT AND WITH CONTRAST TECHNIQUE: Multiplanar, multisequence MR imaging of the right foot was performed before and after the administration of intravenous contrast. CONTRAST:  58m GADAVIST GADOBUTROL 1 MMOL/ML IV SOLN COMPARISON:  None Available. FINDINGS: Bones/Joint/Cartilage No acute fracture or dislocation. Prior amputation of the second metatarsal head and phalanx. Prior amputation of the distal third of the third metatarsal and proximal phalanx. Normal alignment. No joint effusion. No marrow signal abnormality. No bone destruction or periosteal reaction. Hallux valgus. Moderate osteoarthritis of the first MTP joint. Ligaments Collateral ligaments are intact.  Lisfranc ligament is intact. Muscles and Tendons Flexor, peroneal and extensor compartment tendons are intact. Diffuse muscle atrophy. Soft tissue No fluid collection  or hematoma. No soft tissue mass. Diffuse soft tissue edema along the dorsal aspect of the foot with a soft tissue wound along the dorsal medial midfoot and associated knee enhancement concerning for cellulitis. IMPRESSION: 1. Diffuse soft tissue edema along the dorsal aspect of the foot with a soft tissue wound along the dorsal medial midfoot and associated knee enhancement concerning for cellulitis. No drainable fluid collection to  suggest an abscess. 2. No osteomyelitis of the right foot. Electronically Signed   By: Kathreen Devoid M.D.   On: 10/07/2022 09:07   US ABDOMEN LIMITED RUQ (LIVER/GB)  Result Date: 10/06/2022 CLINICAL DATA:  Abdomen pain abnormal LFT EXAM: ULTRASOUND ABDOMEN LIMITED RIGHT UPPER QUADRANT COMPARISON:  None Available. FINDINGS: Gallbladder: Contracted gallbladder. No shadowing stones. Upper normal wall thickness. Negative sonographic Murphy Common bile duct: Diameter: 3.4 mm Liver: Echogenic liver parenchyma. No focal hepatic abnormality. Portal vein is patent on color Doppler imaging with normal direction of blood flow towards the liver. Other: None. IMPRESSION: Negative for gallstones or biliary dilatation. Echogenic liver parenchyma consistent with hepatic steatosis. Electronically Signed   By: Donavan Foil M.D.   On: 10/06/2022 20:53   CT Head Wo Contrast  Result Date: 10/06/2022 CLINICAL DATA:  Delirium, memory loss EXAM: CT HEAD WITHOUT CONTRAST TECHNIQUE: Contiguous axial images were obtained from the base of the skull through the vertex without intravenous contrast. RADIATION DOSE REDUCTION: This exam was performed according to the departmental dose-optimization program which includes automated exposure control, adjustment of the mA and/or kV according to patient size and/or use of iterative reconstruction technique. COMPARISON:  11/28/2021 FINDINGS: Brain: No evidence of acute infarction, hemorrhage, extra-axial collection, ventriculomegaly, or mass effect. Tiny old left basal ganglia lacunar infarct. Generalized cerebral atrophy. Periventricular white matter low attenuation likely secondary to microangiopathy. Vascular: Cerebrovascular atherosclerotic calcifications are noted. Skull: Negative for fracture or focal lesion. Sinuses/Orbits: Visualized portions of the orbits are unremarkable. Visualized portions of the paranasal sinuses are unremarkable. Visualized portions of the mastoid air cells are  unremarkable. Other: None. IMPRESSION: 1. No acute intracranial findings. 2. Chronic small vessel ischemic changes. Electronically Signed   By: Kathreen Devoid M.D.   On: 10/06/2022 18:21   DG Chest Port 1 View  Result Date: 10/06/2022 CLINICAL DATA:  Weakness. EXAM: PORTABLE CHEST 1 VIEW COMPARISON:  11/27/2021 chest radiograph FINDINGS: The cardiomediastinal silhouette is unremarkable. There is no evidence of focal airspace disease, pulmonary edema, suspicious pulmonary nodule/mass, pleural effusion, or pneumothorax. No acute bony abnormalities are identified. IMPRESSION: No active disease. Electronically Signed   By: Margarette Canada M.D.   On: 10/06/2022 17:54      Time coordinating discharge: over 30 minutes  SIGNED:  Emeterio Reeve DO [facility]

## 2022-10-09 NOTE — Progress Notes (Signed)
Physical Therapy Treatment Patient Details Name: [name] [name] MRN: [id number] DOB: October 03, 1960 Today's Date: 10/09/2022   History of Present Illness Pt is a 62 year old malepresents emergency department for chief concerns of altered mental status from family for 2 weeks, admitted with UTI, Diabetic foot ulcer ([place]) , Cellulitis R foot; PMH significant for chronic alcohol use, history of chronic osteomyelitis, tobacco use, history of cocaine use,    PT Comments    Pt is making good progress towards goals and just received breakfast tray. Pt agreeable to sitting in recliner. Assisted with post op shoe donned and new gown placed. Uses RW for short distance over to recliner with safe technique. Will continue to progress as able.   Recommendations for follow up therapy are one component of a multi-disciplinary discharge planning process, led by the attending physician.  Recommendations may be updated based on patient status, additional functional criteria and insurance authorization.  Follow Up Recommendations  Home health PT     Assistance Recommended at Discharge PRN  Patient can return home with the following A little help with walking and/or transfers;Help with stairs or ramp for entrance;Assist for transportation   Equipment Recommendations  None recommended by PT    Recommendations for Other Services       Precautions / Restrictions Precautions Precautions: Fall Restrictions Weight Bearing Restrictions: Yes RLE Weight Bearing: Weight bearing as tolerated Other Position/Activity Restrictions: in surgical shoe per podiatry     Mobility  Bed Mobility Overal bed mobility: Modified Independent Bed Mobility: Supine to Sit     Supine to sit: Modified independent (Device/Increase time)     General bed mobility comments: safe technique with ease of mobility    Transfers Overall transfer level: Needs assistance Equipment used: Rolling walker (2 wheels) Transfers: Sit to/from  Stand Sit to Stand: Supervision           General transfer comment: post op shoe donned and pt able to stand from low bed. Once standing, upright posture with RW.    Ambulation/Gait Ambulation/Gait assistance: Supervision Gait Distance (Feet): 5 Feet Assistive device: Rolling walker (2 wheels) Gait Pattern/deviations: Step-through pattern       General Gait Details: ambulated short distance from bed->chair with RW. Safe technique, deferred further ambulation secondary to pt wanting to eat breakfast   Stairs             Wheelchair Mobility    Modified Rankin (Stroke Patients Only)       Balance Overall balance assessment: Needs assistance Sitting-balance support: Feet supported Sitting balance-Leahy Scale: Good     Standing balance support: Bilateral upper extremity supported, During functional activity, Reliant on assistive device for balance Standing balance-Leahy Scale: Fair                              Cognition Arousal/Alertness: Awake/alert Behavior During Therapy: Flat affect Overall Cognitive Status: Within Functional Limits for tasks assessed                                          Exercises      General Comments        Pertinent Vitals/Pain Pain Assessment Pain Assessment: No/denies pain    Home Living  Prior Function            PT Goals (current goals can now be found in the care plan section) Acute Rehab PT Goals Patient Stated Goal: to return home PT Goal Formulation: With patient Time For Goal Achievement: 10/15/22 Potential to Achieve Goals: Good Progress towards PT goals: Progressing toward goals    Frequency    Min 2X/week      PT Plan Current plan remains appropriate    Co-evaluation              AM-PAC PT "6 Clicks" Mobility   Outcome Measure  Help needed turning from your back to your side while in a flat bed without using bedrails?:  None Help needed moving from lying on your back to sitting on the side of a flat bed without using bedrails?: None Help needed moving to and from a bed to a chair (including a wheelchair)?: A Little Help needed standing up from a chair using your arms (e.g., wheelchair or bedside chair)?: A Little Help needed to walk in hospital room?: A Little Help needed climbing 3-5 steps with a railing? : A Little 6 Click Score: 20    End of Session   Activity Tolerance: Patient tolerated treatment well Patient left: in chair;with chair alarm set Nurse Communication: Mobility status PT Visit Diagnosis: Other abnormalities of gait and mobility (R26.89);Muscle weakness (generalized) (M62.81);Difficulty in walking, not elsewhere classified (R26.2);Unsteadiness on feet (R26.81)     Time: [id number] PT Time Calculation (min) (ACUTE ONLY): 9 min  Charges:  $Gait Training: 8-22 mins                     Greggory Stallion, PT, DPT, GCS [phone]    [name] 10/09/2022, 9:42 AM

## 2022-10-10 LAB — VITAMIN B1: Vitamin B1 (Thiamine): 120.3 nmol/L (ref 66.5–200.0)

## 2022-10-11 LAB — CULTURE, BLOOD (ROUTINE X 2)
Culture: NO GROWTH
Culture: NO GROWTH
Special Requests: ADEQUATE

## 2022-10-21 ENCOUNTER — Inpatient Hospital Stay
Admission: EM | Admit: 2022-10-21 | Discharge: 2022-10-24 | DRG: 638 | Disposition: A | Discharge status: Skilled Nursing Facility | Payer: Medicare Other | Attending: Student | Admitting: Student

## 2022-10-21 ENCOUNTER — Emergency Department: Payer: Medicare Other

## 2022-10-21 ENCOUNTER — Encounter: Payer: Self-pay | Admitting: Internal Medicine

## 2022-10-21 ENCOUNTER — Other Ambulatory Visit: Payer: Self-pay

## 2022-10-21 DIAGNOSIS — R030 Elevated blood-pressure reading, without diagnosis of hypertension: Secondary | ICD-10-CM | POA: Diagnosis not present

## 2022-10-21 DIAGNOSIS — L03115 Cellulitis of right lower limb: Secondary | ICD-10-CM | POA: Insufficient documentation

## 2022-10-21 DIAGNOSIS — E872 Acidosis, unspecified: Secondary | ICD-10-CM | POA: Diagnosis present

## 2022-10-21 DIAGNOSIS — Z720 Tobacco use: Secondary | ICD-10-CM | POA: Diagnosis present

## 2022-10-21 DIAGNOSIS — Z89421 Acquired absence of other right toe(s): Secondary | ICD-10-CM

## 2022-10-21 DIAGNOSIS — E538 Deficiency of other specified B group vitamins: Secondary | ICD-10-CM | POA: Diagnosis present

## 2022-10-21 DIAGNOSIS — D7589 Other specified diseases of blood and blood-forming organs: Secondary | ICD-10-CM | POA: Diagnosis present

## 2022-10-21 DIAGNOSIS — Z823 Family history of stroke: Secondary | ICD-10-CM

## 2022-10-21 DIAGNOSIS — L97512 Non-pressure chronic ulcer of other part of right foot with fat layer exposed: Secondary | ICD-10-CM | POA: Diagnosis not present

## 2022-10-21 DIAGNOSIS — Z91199 Patient's noncompliance with other medical treatment and regimen due to unspecified reason: Secondary | ICD-10-CM | POA: Diagnosis not present

## 2022-10-21 DIAGNOSIS — Z6828 Body mass index (BMI) 28.0-28.9, adult: Secondary | ICD-10-CM | POA: Diagnosis not present

## 2022-10-21 DIAGNOSIS — E11621 Type 2 diabetes mellitus with foot ulcer: Secondary | ICD-10-CM | POA: Diagnosis not present

## 2022-10-21 DIAGNOSIS — F10139 Alcohol abuse with withdrawal, unspecified: Secondary | ICD-10-CM | POA: Diagnosis not present

## 2022-10-21 DIAGNOSIS — I739 Peripheral vascular disease, unspecified: Secondary | ICD-10-CM | POA: Diagnosis present

## 2022-10-21 DIAGNOSIS — R251 Tremor, unspecified: Secondary | ICD-10-CM | POA: Diagnosis not present

## 2022-10-21 DIAGNOSIS — E1151 Type 2 diabetes mellitus with diabetic peripheral angiopathy without gangrene: Secondary | ICD-10-CM | POA: Diagnosis present

## 2022-10-21 DIAGNOSIS — F10129 Alcohol abuse with intoxication, unspecified: Secondary | ICD-10-CM | POA: Diagnosis present

## 2022-10-21 DIAGNOSIS — D638 Anemia in other chronic diseases classified elsewhere: Secondary | ICD-10-CM | POA: Diagnosis present

## 2022-10-21 DIAGNOSIS — Z833 Family history of diabetes mellitus: Secondary | ICD-10-CM | POA: Diagnosis not present

## 2022-10-21 DIAGNOSIS — L97504 Non-pressure chronic ulcer of other part of unspecified foot with necrosis of bone: Secondary | ICD-10-CM | POA: Diagnosis not present

## 2022-10-21 DIAGNOSIS — F17210 Nicotine dependence, cigarettes, uncomplicated: Secondary | ICD-10-CM | POA: Diagnosis present

## 2022-10-21 DIAGNOSIS — L97519 Non-pressure chronic ulcer of other part of right foot with unspecified severity: Secondary | ICD-10-CM | POA: Diagnosis present

## 2022-10-21 DIAGNOSIS — Z89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: Secondary | ICD-10-CM

## 2022-10-21 DIAGNOSIS — R4689 Other symptoms and signs involving appearance and behavior: Secondary | ICD-10-CM | POA: Diagnosis not present

## 2022-10-21 DIAGNOSIS — F1010 Alcohol abuse, uncomplicated: Secondary | ICD-10-CM | POA: Diagnosis not present

## 2022-10-21 DIAGNOSIS — Z1152 Encounter for screening for COVID-19: Secondary | ICD-10-CM

## 2022-10-21 DIAGNOSIS — Y906 Blood alcohol level of 120-199 mg/100 ml: Secondary | ICD-10-CM | POA: Diagnosis present

## 2022-10-21 DIAGNOSIS — R000 Tachycardia, unspecified: Secondary | ICD-10-CM | POA: Diagnosis not present

## 2022-10-21 DIAGNOSIS — L089 Local infection of the skin and subcutaneous tissue, unspecified: Principal | ICD-10-CM

## 2022-10-21 DIAGNOSIS — L97412 Non-pressure chronic ulcer of right heel and midfoot with fat layer exposed: Secondary | ICD-10-CM | POA: Diagnosis not present

## 2022-10-21 DIAGNOSIS — R5381 Other malaise: Secondary | ICD-10-CM | POA: Diagnosis not present

## 2022-10-21 LAB — BASIC METABOLIC PANEL
Anion gap: 10 (ref 5–15)
BUN: <5 mg/dL — ABNORMAL LOW (ref 8–23)
CO2: 23 mmol/L (ref 22–32)
Calcium: 8.8 mg/dL — ABNORMAL LOW (ref 8.9–10.3)
Chloride: 103 mmol/L (ref 98–111)
Creatinine, Ser: 0.56 mg/dL — ABNORMAL LOW (ref 0.61–1.24)
GFR, Estimated: >60 mL/min (ref >60)
Glucose, Bld: 78 mg/dL (ref 70–99)
Potassium: 3.6 mmol/L (ref 3.5–5.1)
Sodium: 136 mmol/L (ref 135–145)

## 2022-10-21 LAB — URINALYSIS, ROUTINE W REFLEX MICROSCOPIC
Bilirubin Urine: NEGATIVE
Glucose, UA: NEGATIVE mg/dL
Hgb urine dipstick: NEGATIVE
Ketones, ur: NEGATIVE mg/dL
Leukocytes,Ua: NEGATIVE
Nitrite: NEGATIVE
Protein, ur: NEGATIVE mg/dL
Specific Gravity, Urine: 1.003 — ABNORMAL LOW (ref 1.005–1.030)
pH: 5 (ref 5.0–8.0)

## 2022-10-21 LAB — HEPATIC FUNCTION PANEL
ALT: 24 U/L (ref 0–44)
AST: 41 U/L (ref 15–41)
Albumin: 3.1 g/dL — ABNORMAL LOW (ref 3.5–5.0)
Alkaline Phosphatase: 77 U/L (ref 38–126)
Bilirubin, Direct: 0.2 mg/dL (ref 0.0–0.2)
Indirect Bilirubin: 0.4 mg/dL (ref 0.3–0.9)
Total Bilirubin: 0.6 mg/dL (ref 0.3–1.2)
Total Protein: 6.4 g/dL — ABNORMAL LOW (ref 6.5–8.1)

## 2022-10-21 LAB — CBC
HCT: 41 % (ref 39.0–52.0)
HCT: 34.8 % — ABNORMAL LOW (ref 39.0–52.0)
Hemoglobin: 13.3 g/dL (ref 13.0–17.0)
Hemoglobin: 11.9 g/dL — ABNORMAL LOW (ref 13.0–17.0)
MCH: 35.4 pg — ABNORMAL HIGH (ref 26.0–34.0)
MCH: 36.8 pg — ABNORMAL HIGH (ref 26.0–34.0)
MCHC: 32.4 g/dL (ref 30.0–36.0)
MCHC: 34.2 g/dL (ref 30.0–36.0)
MCV: 109 fL — ABNORMAL HIGH (ref 80.0–100.0)
MCV: 107.7 fL — ABNORMAL HIGH (ref 80.0–100.0)
Platelets: 310 10*3/uL (ref 150–400)
Platelets: 279 10*3/uL (ref 150–400)
RBC: 3.76 MIL/uL — ABNORMAL LOW (ref 4.22–5.81)
RBC: 3.23 MIL/uL — ABNORMAL LOW (ref 4.22–5.81)
RDW: 13.2 % (ref 11.5–15.5)
RDW: 13.1 % (ref 11.5–15.5)
WBC: 3.6 10*3/uL — ABNORMAL LOW (ref 4.0–10.5)
WBC: 4.5 10*3/uL (ref 4.0–10.5)
nRBC: 0 % (ref 0.0–0.2)
nRBC: 0 % (ref 0.0–0.2)

## 2022-10-21 LAB — URINE DRUG SCREEN, QUALITATIVE (ARMC ONLY)
Amphetamines, Ur Screen: NOT DETECTED
Barbiturates, Ur Screen: NOT DETECTED
Benzodiazepine, Ur Scrn: NOT DETECTED
Cannabinoid 50 Ng, Ur ~~LOC~~: NOT DETECTED
Cocaine Metabolite,Ur ~~LOC~~: NOT DETECTED
MDMA (Ecstasy)Ur Screen: NOT DETECTED
Methadone Scn, Ur: NOT DETECTED
Opiate, Ur Screen: NOT DETECTED
Phencyclidine (PCP) Ur S: NOT DETECTED
Tricyclic, Ur Screen: NOT DETECTED

## 2022-10-21 LAB — RESP PANEL BY RT-PCR (RSV, FLU A&B, COVID)  RVPGX2
Influenza A by PCR: NEGATIVE
Influenza B by PCR: NEGATIVE
Resp Syncytial Virus by PCR: NEGATIVE
SARS Coronavirus 2 by RT PCR: NEGATIVE

## 2022-10-21 LAB — TROPONIN I (HIGH SENSITIVITY)
Troponin I (High Sensitivity): 6 ng/L (ref <18)
Troponin I (High Sensitivity): 7 ng/L (ref <18)

## 2022-10-21 LAB — MAGNESIUM: Magnesium: 1.9 mg/dL (ref 1.7–2.4)

## 2022-10-21 LAB — LACTIC ACID, PLASMA
Lactic Acid, Venous: 3 mmol/L (ref 0.5–1.9)
Lactic Acid, Venous: 3 mmol/L (ref 0.5–1.9)

## 2022-10-21 LAB — ETHANOL: Alcohol, Ethyl (B): 161 mg/dL — ABNORMAL HIGH (ref <10)

## 2022-10-21 LAB — CREATININE, SERUM
Creatinine, Ser: 0.61 mg/dL (ref 0.61–1.24)
GFR, Estimated: >60 mL/min (ref >60)

## 2022-10-21 LAB — LIPASE, BLOOD: Lipase: 41 U/L (ref 11–51)

## 2022-10-21 MED ORDER — LACTATED RINGERS IV SOLN: INTRAVENOUS | Status: DC

## 2022-10-21 MED ORDER — LORAZEPAM 1 MG PO TABS
1.0000 mg | ORAL_TABLET | ORAL | Status: DC | PRN
  Administered 2022-10-22: 1 mg via ORAL
  Filled 2022-10-21: qty 1

## 2022-10-21 MED ORDER — ONDANSETRON HCL 4 MG PO TABS: 4.0000 mg | ORAL_TABLET | Freq: Four times a day (QID) | ORAL | Status: DC | PRN

## 2022-10-21 MED ORDER — THIAMINE HCL 100 MG/ML IJ SOLN
100.0000 mg | Freq: Every day | INTRAMUSCULAR | Status: DC
  Filled 2022-10-21: qty 2

## 2022-10-21 MED ORDER — THIAMINE MONONITRATE 100 MG PO TABS
100.0000 mg | ORAL_TABLET | Freq: Every day | ORAL | Status: DC
  Administered 2022-10-21 – 2022-10-24 (×4): 100 mg via ORAL
  Filled 2022-10-21 (×4): qty 1

## 2022-10-21 MED ORDER — MORPHINE SULFATE (PF) 2 MG/ML IV SOLN: 2.0000 mg | INTRAVENOUS | Status: DC | PRN

## 2022-10-21 MED ORDER — VANCOMYCIN HCL 1500 MG/300ML IV SOLN
1500.0000 mg | Freq: Once | INTRAVENOUS | Status: AC
  Administered 2022-10-21: 1500 mg via INTRAVENOUS
  Filled 2022-10-21 (×2): qty 300

## 2022-10-21 MED ORDER — ENOXAPARIN SODIUM 40 MG/0.4ML IJ SOSY
40.0000 mg | PREFILLED_SYRINGE | INTRAMUSCULAR | Status: DC
  Administered 2022-10-21 – 2022-10-23 (×3): 40 mg via SUBCUTANEOUS
  Filled 2022-10-21 (×3): qty 0.4

## 2022-10-21 MED ORDER — ADULT MULTIVITAMIN W/MINERALS CH
1.0000 | ORAL_TABLET | Freq: Every day | ORAL | Status: DC
  Administered 2022-10-21 – 2022-10-24 (×4): 1 via ORAL
  Filled 2022-10-21 (×4): qty 1

## 2022-10-21 MED ORDER — FOLIC ACID 1 MG PO TABS
1.0000 mg | ORAL_TABLET | Freq: Every day | ORAL | Status: DC
  Administered 2022-10-21 – 2022-10-24 (×4): 1 mg via ORAL
  Filled 2022-10-21 (×4): qty 1

## 2022-10-21 MED ORDER — VANCOMYCIN HCL IN DEXTROSE 1-5 GM/200ML-% IV SOLN
1000.0000 mg | Freq: Once | INTRAVENOUS | Status: AC
  Administered 2022-10-22: 1000 mg via INTRAVENOUS
  Filled 2022-10-21: qty 200

## 2022-10-21 MED ORDER — SODIUM CHLORIDE 0.9 % IV BOLUS
1000.0000 mL | Freq: Once | INTRAVENOUS | Status: AC
  Administered 2022-10-21: 1000 mL via INTRAVENOUS

## 2022-10-21 MED ORDER — ONDANSETRON HCL 4 MG/2ML IJ SOLN: 4.0000 mg | Freq: Four times a day (QID) | INTRAMUSCULAR | Status: DC | PRN

## 2022-10-21 NOTE — ED Triage Notes (Signed)
Pt states he had some dizziness this morning.  Pt states he has not been eating but he had a 40 ounce today. Denies any chest pain or sob.

## 2022-10-21 NOTE — ED Notes (Signed)
First nurse note:  BIB [facility] from home for weakness x 2 weeks. Denies any major medical HX and complications. CAOx4 for EMS. Pt urinated and defecated on himself 180/102 - with no HX Has not been to the doctor in years 117HR No TEMP by EMS 99% on RA.  BGL 89

## 2022-10-21 NOTE — ED Notes (Signed)
Pt asking to go smoke. Informed pt this is not possible.

## 2022-10-21 NOTE — ED Notes (Signed)
Notified Dr Jacelyn Grip about lactate of 3.0.

## 2022-10-21 NOTE — H&P (Signed)
History and Physical    Patient: [name] PLOUGH [id number] DOB: November 07, 1960 DOA: 10/21/2022 DOS: the patient was seen and examined on 10/21/2022 PCP: Pcp, No  Patient coming from: Home  Chief Complaint:  Chief Complaint  Patient presents with   Dizziness   HPI: [name] [name] is a 62 y.o. male with medical history significant of Diet controlled Diabetes, PVD, medication non-compliance, Anemia of chronic disease, Previous amputation of left great toe and tobacco and alcohol abuse here with right foot cellulitis and possible gangrene of right 2nd toe. Patient has not been compliant with treatment.  Patient being admitted with cellulitis for further treatment.  No fever or chills.  Initial eval patient showed sepsis picture.  X-ray of the foot shows no new cortical conditions but second and third digit amputations. Review of Systems: As mentioned in the history of present illness. All other systems reviewed and are negative. Past Medical History:  Diagnosis Date   Anemia of chronic disease    Diabetes mellitus without complication ([place])    Tobacco use disorder    Toe gangrene [facility])    Past Surgical History:  Procedure Laterality Date   ACHILLES TENDON SURGERY Right 09/22/2016   Procedure: ACHILLES TENDON LENGTHENING;  Surgeon: Samara Deist, DPM;  Location: [facility] ORS;  Service: Podiatry;  Laterality: Right;   AMPUTATION Right 01/14/2021   Procedure: AMPUTATION RAY;  Surgeon: Criselda Peaches, DPM;  Location: [facility];  Service: Podiatry;  Laterality: Right;   AMPUTATION Left 07/01/2021   Procedure: AMPUTATION RAY;  Surgeon: Edrick Kins, DPM;  Location: [facility];  Service: Podiatry;  Laterality: Left;   AMPUTATION TOE Left 07/12/2015   Procedure: AMPUTATION TOE;  Surgeon: Samara Deist, DPM;  Location: [facility];  Service: Podiatry;  Laterality: Left;   AMPUTATION TOE Right 09/22/2016   Procedure: AMPUTATION TOE;  Surgeon: Samara Deist, DPM;  Location: [facility];  Service: Podiatry;   Laterality: Right;   BONE BIOPSY Left 07/01/2021   Procedure: BONE BIOPSY;  Surgeon: Edrick Kins, DPM;  Location: [facility];  Service: Podiatry;  Laterality: Left;   INCISION AND DRAINAGE OF WOUND Bilateral 01/14/2021   Procedure: IRRIGATION AND DEBRIDEMENT WOUND;  Surgeon: Criselda Peaches, DPM;  Location: [facility];  Service: Podiatry;  Laterality: Bilateral;   IRRIGATION AND DEBRIDEMENT FOOT Left 07/12/2015   Procedure: IRRIGATION AND DEBRIDEMENT FOOT;  Surgeon: Samara Deist, DPM;  Location: [facility];  Service: Podiatry;  Laterality: Left;   IRRIGATION AND DEBRIDEMENT FOOT Left 09/06/2015   Procedure: IRRIGATION AND DEBRIDEMENT FOOT;  Surgeon: Albertine Patricia, DPM;  Location: [facility];  Service: Podiatry;  Laterality: Left;   Social History:  reports that he has been smoking cigarettes. He has been smoking an average of .5 packs per day. He has never used smokeless tobacco. He reports current alcohol use of about 4.0 standard drinks of alcohol per week. He reports current drug use.  No Known Allergies  Family History  Problem Relation Age of Onset   CVA Mother    CVA Brother    Diabetes Mellitus II Father     Prior to Admission medications   Medication Sig Start Date End Date Taking? Authorizing Provider  folic acid (FOLVITE) 1 MG tablet Take 1 tablet (1 mg total) by mouth daily. 10/09/22   Emeterio Reeve, DO  Multiple Vitamin (MULTIVITAMIN WITH MINERALS) TABS tablet Take 1 tablet by mouth daily. 10/09/22   Emeterio Reeve, DO  Silver (AQUACEL AG FOAM) [phone] PADS Apply to foot wound as  directed 1-2 times per day, cover with tape or wrap with ACE wrap 10/09/22   Emeterio Reeve, DO  thiamine (VITAMIN B-1) 100 MG tablet Take 1 tablet (100 mg total) by mouth daily. 10/09/22   Emeterio Reeve, DO    Physical Exam: Vitals:   10/21/22 1930 10/21/22 2000 10/21/22 2029 10/21/22 2058  BP: 138/89 (!) 166/81    Pulse: (!) 110 (!) 105    Resp:  20    Temp:   98 F (36.7 C)    TempSrc:      SpO2: 98% 97%    Weight:    104.7 kg  Height:    '6\' 4"'$  (1.93 m)   Constitutional: Morbidly obese, acutely ill looking, NAD, calm, comfortable Eyes: PERRL, lids and conjunctivae normal ENMT: Mucous membranes are moist. Posterior pharynx clear of any exudate or lesions.Normal dentition.  Neck: normal, supple, no masses, no thyromegaly Respiratory: clear to auscultation bilaterally, no wheezing, no crackles. Normal respiratory effort. No accessory muscle use.  Cardiovascular: Sinus tachycardia, no murmurs / rubs / gallops. No extremity edema. 2+ pedal pulses. No carotid bruits.  Abdomen: no tenderness, no masses palpated. No hepatosplenomegaly. Bowel sounds positive.  Musculoskeletal: Right foot swollen, tender, warm, missing second and third toes from previous amputations  skin: no rashes, lesions, ulcers. No induration Neurologic: CN 2-12 grossly intact. Sensation intact, DTR normal. Strength 5/5 in all 4.  Psychiatric: Normal judgment and insight. Alert and oriented x 3. Normal mood  Data Reviewed:  Chemistry showed creatinine of 0.56, lactic acid 3.0, white count 4.5 hemoglobin 11.9 and platelets 279, acute viral screen negative.  Urine drug screen is negative.  Head CT without contrast is negative.  Chest x-ray showed no acute findings.  X-ray of the right foot showed redemonstration of second and third digit amputation similar to prior with no new lesion.  Assessment and Plan:  #1 infected right foot ulcer: Patient with history of diabetic ulcer.  His A1c last time was 5.1.  It appears more of peripheral vascular disease and vascular ulceration.  At this point we will admit the patient.  Initiate IV antibiotics.  Get MRI of the foot and depending on results may get podiatry consult.  Blood cultures.  #2 peripheral vascular disease: Continue supportive care.  Patient may need vascular studies including ABIs  #3 history of diabetes: Last A1c was only 5.0 indicating full  control.  Not on any medications.  Initiate recheck of lactate.  #4 alcohol abuse: Initiate CIWA protocol.  Alcohol level more than 100.  #5 tobacco abuse: Tobacco cessation counseling.  Nicotine patch if needed.  #6 morbid obesity: Dietary counseling      Advance Care Planning:   Code Status: Full Code   Consults: None but podiatry consult in the morning  Family Communication: No family at bedside  Severity of Illness: The appropriate patient status for this patient is INPATIENT. Inpatient status is judged to be reasonable and necessary in order to provide the required intensity of service to ensure the patient's safety. The patient's presenting symptoms, physical exam findings, and initial radiographic and laboratory data in the context of their chronic comorbidities is felt to place them at high risk for further clinical deterioration. Furthermore, it is not anticipated that the patient will be medically stable for discharge from the hospital within 2 midnights of admission.   * I certify that at the point of admission it is my clinical judgment that the patient will require inpatient hospital care spanning beyond 2  midnights from the point of admission due to high intensity of service, high risk for further deterioration and high frequency of surveillance required.*  AuthorBarbette Merino, MD 10/21/2022 9:02 PM  For on call review [email or web address].

## 2022-10-21 NOTE — ED Notes (Signed)
Lab attempted to draw blood in triage, unsuccessful. IV team consult entered.

## 2022-10-21 NOTE — ED Provider Notes (Signed)
[facility] Provider Note    None    (approximate)   History   Dizziness   HPI  [name] [name] is a 62 y.o. male   Past medical history of right foot osteomyelitis, diabetic foot ulcer, hyponatremia, alcohol use, hepatic steatosis, tobacco use, anemia of chronic disease who presents to the emergency department with 1 week of progressive fatigue and weakness as well as increasing right foot pain.  He denies fever or chills, denies chest pain, respiratory-GI/GU complaints.  He states that he was discharged recently from the hospital and has been living with his father and felt better for the first few days but progressively worsening since then.  He denies any falls but is unsure.  He does drink daily and his last drink was a 40 ounce earlier today.  He denies drug use.   External Medical Documents Reviewed: Discharge summary from 10/09/2022 when he was admitted for altered mental status and found to have mild hyponatremia, urinary tract infection, infected diabetic foot ulcer, met sepsis criteria was discharged on 2/12      Physical Exam   Triage Vital Signs: ED Triage Vitals  Enc Vitals Group     BP 10/21/22 1547 137/83     Pulse Rate 10/21/22 1547 92     Resp 10/21/22 1547 18     Temp 10/21/22 1547 (!) 97.5 F (36.4 C)     Temp Source 10/21/22 1547 Oral     SpO2 10/21/22 1547 97 %     Weight --      Height --      Head Circumference --      Peak Flow --      Pain Score 10/21/22 1548 0     Pain Loc --      Pain Edu? --      Excl. in [place]? --     Most recent vital signs: Vitals:   10/21/22 1900 10/21/22 1930  BP: (!) 154/92 138/89  Pulse: 96 (!) 110  Resp:    Temp:    SpO2: 99% 98%    General: Awake, no distress.  CV:  Good peripheral perfusion.  Resp:  Normal effort.  Abd:  No distention.  Other:  Wake alert oriented cooperative with normal hemodynamics, no fever, lungs clear, abdomen soft and nontender skin appears warm  well-perfused and he does have a foot ulceration to the medial anterior right foot with toe amputation no crepitus or pain out of proportion, with some surrounding cellulitic changes and necrotic looking second toe.  The other foot appears well no ulcerations or signs of infection.  No signs of obvious trauma.  He is moving all extremities, sensation intact, no facial asymmetry or dysarthria.   ED Results / Procedures / Treatments   Labs (all labs ordered are listed, but only abnormal results are displayed) Labs Reviewed  BASIC METABOLIC PANEL - Abnormal; Notable for the following components:      Result Value   BUN <5 (*)    Creatinine, Ser 0.56 (*)    Calcium 8.8 (*)    All other components within normal limits  CBC - Abnormal; Notable for the following components:   WBC 3.6 (*)    RBC 3.76 (*)    MCV 109.0 (*)    MCH 35.4 (*)    All other components within normal limits  URINALYSIS, ROUTINE W REFLEX MICROSCOPIC - Abnormal; Notable for the following components:   Specific Gravity, Urine 1.003 (*)  All other components within normal limits  ETHANOL - Abnormal; Notable for the following components:   Alcohol, Ethyl (B) 161 (*)    All other components within normal limits  HEPATIC FUNCTION PANEL - Abnormal; Notable for the following components:   Total Protein 6.4 (*)    Albumin 3.1 (*)    All other components within normal limits  LACTIC ACID, PLASMA - Abnormal; Notable for the following components:   Lactic Acid, Venous 3.0 (*)    All other components within normal limits  RESP PANEL BY RT-PCR (RSV, FLU A&B, COVID)  RVPGX2  LIPASE, BLOOD  MAGNESIUM  URINE DRUG SCREEN, QUALITATIVE ([facility] ONLY)  LACTIC ACID, PLASMA  CBG MONITORING, ED  TROPONIN I (HIGH SENSITIVITY)  TROPONIN I (HIGH SENSITIVITY)     I ordered and reviewed the above labs they are notable for white blood cell count is low at 3.6 compared to 5.9 during last hospitalization in earlier February 2024, his  hemoglobin is 13.3, which is baseline and macrocytic 109 which is baseline  EKG  ED ECG REPORT I, Lucillie Garfinkel, the attending physician, personally viewed and interpreted this ECG.   Date: 10/21/2022  EKG Time: 1553  Rate: 97  Rhythm: nsr  Axis: nl  Intervals:none  ST&T Change: No acute ischemic changes    RADIOLOGY I independently reviewed and interpreted x-ray of the foot and see no obvious erosive changes to the bone or subcutaneous gas   PROCEDURES:  Critical Care performed: No  Procedures   MEDICATIONS ORDERED IN ED: Medications  LORazepam (ATIVAN) tablet 1-4 mg (has no administration in time range)  thiamine (VITAMIN B1) tablet 100 mg (100 mg Oral Given 10/21/22 1807)    Or  thiamine (VITAMIN B1) injection 100 mg ( Intravenous See Alternative [id number])  folic acid (FOLVITE) tablet 1 mg (1 mg Oral Given 10/21/22 1807)  multivitamin with minerals tablet 1 tablet (1 tablet Oral Given 10/21/22 1807)  sodium chloride 0.9 % bolus 1,000 mL (1,000 mLs Intravenous New Bag/Given 10/21/22 1915)    External physician / consultants:  I spoke with hospitalist for admission regarding care plan for this patient.   IMPRESSION / MDM / ASSESSMENT AND PLAN / ED COURSE  I reviewed the triage vital signs and the nursing notes.                                Patient's presentation is most consistent with acute presentation with potential threat to life or bodily function.  Differential diagnosis includes, but is not limited to, infection including foot infection/osteomyelitis given his foot pain, urinary tract infection, respiratory infection, electrolyte disturbance, alcohol intoxication, micronutrient deficiency, intracranial bleeding   The patient is on the cardiac monitor to evaluate for evidence of arrhythmia and/or significant heart rate changes.  MDM: Patient with generalized weakness and fatigue since leaving the hospital with urinary tract infection and foot ulceration  osteomyelitis.  Worsening foot pain as well.  Check foot x-ray, infectious labs including basic labs, lactic acid, check urinalysis, viral swabs and chest x-ray for infectious workup.  He is unsure of falls and is a daily drinker will check a CT scan of his head for intracranial bleeding.  Patient with some infectious changes today right foot but no signs of osteomyelitis on x-ray, hemodynamics appropriate and reassuring aside from some mild tachycardia, given IV fluids, no fever but he does have a lactic acidosis of 3.0.  I put him  on IV vancomycin for cellulitis coverage and will admit him.        FINAL CLINICAL IMPRESSION(S) / ED DIAGNOSES   Final diagnoses:  Right foot infection     Rx / DC Orders   ED Discharge Orders     None        Note:  This document was prepared using Dragon voice recognition software and may include unintentional dictation errors.    Lucillie Garfinkel, MD 10/21/22 [phone]

## 2022-10-21 NOTE — ED Notes (Signed)
U/S IV nurse at bedside in triage middle to get IV access and labs then pt will go to room 16.

## 2022-10-21 NOTE — Consult Note (Signed)
PHARMACY -  BRIEF ANTIBIOTIC NOTE   Pharmacy has received consult(s) for vancomycin from an ED provider.  The patient's profile has been reviewed for ht/wt/allergies/indication/available labs.    One time order(s) placed for vancomycin '2500mg'$  IV x1 (based on recent weight from 10/09/2022 of 109 kg)   Further antibiotics/pharmacy consults should be ordered by admitting physician if indicated.                       Thank you, Darrick Penna 10/21/2022  7:53 PM

## 2022-10-22 ENCOUNTER — Inpatient Hospital Stay: Payer: Medicare Other

## 2022-10-22 DIAGNOSIS — L97412 Non-pressure chronic ulcer of right heel and midfoot with fat layer exposed: Secondary | ICD-10-CM

## 2022-10-22 DIAGNOSIS — L03115 Cellulitis of right lower limb: Secondary | ICD-10-CM | POA: Diagnosis not present

## 2022-10-22 DIAGNOSIS — F1010 Alcohol abuse, uncomplicated: Secondary | ICD-10-CM | POA: Diagnosis not present

## 2022-10-22 DIAGNOSIS — E11621 Type 2 diabetes mellitus with foot ulcer: Secondary | ICD-10-CM | POA: Diagnosis not present

## 2022-10-22 DIAGNOSIS — L97512 Non-pressure chronic ulcer of other part of right foot with fat layer exposed: Secondary | ICD-10-CM | POA: Diagnosis not present

## 2022-10-22 DIAGNOSIS — E872 Acidosis, unspecified: Secondary | ICD-10-CM

## 2022-10-22 DIAGNOSIS — D7589 Other specified diseases of blood and blood-forming organs: Secondary | ICD-10-CM

## 2022-10-22 LAB — CBC
HCT: 33.8 % — ABNORMAL LOW (ref 39.0–52.0)
Hemoglobin: 11.5 g/dL — ABNORMAL LOW (ref 13.0–17.0)
MCH: 36.5 pg — ABNORMAL HIGH (ref 26.0–34.0)
MCHC: 34 g/dL (ref 30.0–36.0)
MCV: 107.3 fL — ABNORMAL HIGH (ref 80.0–100.0)
Platelets: 266 10*3/uL (ref 150–400)
RBC: 3.15 MIL/uL — ABNORMAL LOW (ref 4.22–5.81)
RDW: 13.2 % (ref 11.5–15.5)
WBC: 4.4 10*3/uL (ref 4.0–10.5)
nRBC: 0 % (ref 0.0–0.2)

## 2022-10-22 LAB — COMPREHENSIVE METABOLIC PANEL
ALT: 21 U/L (ref 0–44)
AST: 31 U/L (ref 15–41)
Albumin: 2.9 g/dL — ABNORMAL LOW (ref 3.5–5.0)
Alkaline Phosphatase: 72 U/L (ref 38–126)
Anion gap: 9 (ref 5–15)
BUN: <5 mg/dL — ABNORMAL LOW (ref 8–23)
CO2: 22 mmol/L (ref 22–32)
Calcium: 8.6 mg/dL — ABNORMAL LOW (ref 8.9–10.3)
Chloride: 104 mmol/L (ref 98–111)
Creatinine, Ser: 0.64 mg/dL (ref 0.61–1.24)
GFR, Estimated: >60 mL/min (ref >60)
Glucose, Bld: 110 mg/dL — ABNORMAL HIGH (ref 70–99)
Potassium: 3.6 mmol/L (ref 3.5–5.1)
Sodium: 135 mmol/L (ref 135–145)
Total Bilirubin: 1 mg/dL (ref 0.3–1.2)
Total Protein: 5.8 g/dL — ABNORMAL LOW (ref 6.5–8.1)

## 2022-10-22 LAB — LIPID PANEL
Cholesterol: 180 mg/dL (ref 0–200)
HDL: 98 mg/dL (ref >40)
LDL Cholesterol: 69 mg/dL (ref 0–99)
Total CHOL/HDL Ratio: 1.8 RATIO
Triglycerides: 65 mg/dL (ref <150)
VLDL: 13 mg/dL (ref 0–40)

## 2022-10-22 LAB — LACTIC ACID, PLASMA
Lactic Acid, Venous: 1.3 mmol/L (ref 0.5–1.9)
Lactic Acid, Venous: 3.4 mmol/L (ref 0.5–1.9)

## 2022-10-22 MED ORDER — GADOBUTROL 1 MMOL/ML IV SOLN
10.0000 mL | Freq: Once | INTRAVENOUS | Status: AC | PRN
  Administered 2022-10-22: 10 mL via INTRAVENOUS

## 2022-10-22 MED ORDER — CEFAZOLIN SODIUM-DEXTROSE 2-4 GM/100ML-% IV SOLN
2.0000 g | Freq: Three times a day (TID) | INTRAVENOUS | Status: DC
  Administered 2022-10-22 – 2022-10-24 (×6): 2 g via INTRAVENOUS
  Filled 2022-10-22 (×7): qty 100

## 2022-10-22 MED ORDER — CLONIDINE HCL 0.1 MG PO TABS
0.1000 mg | ORAL_TABLET | Freq: Two times a day (BID) | ORAL | Status: DC
  Administered 2022-10-22 – 2022-10-24 (×4): 0.1 mg via ORAL
  Filled 2022-10-22 (×4): qty 1

## 2022-10-22 NOTE — Progress Notes (Signed)
PROGRESS NOTE  [name] [name] [id number] DOB: 1961-04-18   PCP: Pcp, No  Patient is from: Home.  Lives alone.  Uses cane at baseline.  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   In ED, stable vitals.  CMP and CBC without significant finding other than mild leukopenia with macrocytosis.  Lactic acid elevated to 3.0.  Troponin negative x 2. X-ray of the foot shows no new cortical conditions but second and third digit amputations.  Alcohol level 161.  Patient was given IV vancomycin.  MRI right foot ordered.   Subjective: Seen and examined earlier this morning.  No major events overnight or this morning.  No complaints.  No further pain in his right foot.  He denies fever or chills.  Reports smoking 4 to 5 cigarettes a day.  Reports drinking 3-4 beers a day.  Objective: Vitals:   10/21/22 2029 10/21/22 2058 10/21/22 2153 10/22/22 0734  BP:   138/73 (!) 154/95  Pulse:   (!) 108 (!) 106  Resp:   18 15  Temp: 98 F (36.7 C)  98.5 F (36.9 C) 97.7 F (36.5 C)  TempSrc:      SpO2:   99% 98%  Weight:  104.7 kg    Height:  '6\' 4"'$  (1.93 m)      Examination:  GENERAL: No apparent distress.  Nontoxic. HEENT: MMM.  Vision and hearing grossly intact.  NECK: Supple.  No apparent JVD.  RESP:  No IWOB.  Fair aeration bilaterally. CVS:  RRR. Heart sounds normal.  ABD/GI/GU: BS+. Abd soft, NTND.  MSK/EXT:  Moves extremities. S/p left great toe and right second toe amputation.  Not able to palpate DP SKIN: Ulceration over dorsal aspect of right foot.  No apparent drainage or surrounding  erythema. NEURO: Awake, alert and oriented appropriately.  No apparent focal neuro deficit. PSYCH: Calm. Normal affect.   Procedures:  None  Microbiology summarized: [id number], influenza and RSV PCR nonreactive.  Assessment and plan: Principal Problem:   Diabetic foot ulcer ([place]) Active Problems:   Tobacco abuse   Peripheral vascular disease ([place])   Alcohol abuse   Cellulitis of right lower extremity  Right foot pain/cellulitis/diabetic foot ulcer: Presents with right foot pain.  Has no fever or leukocytosis.  Lactic acidosis likely from alcohol.  X-ray and MRI without abscess or osteomyelitis but concerning for cellulitis.  He has history of PVD.  Not able to palpate DP pulses.  LDL 69.  Not diabetic.  A1c 5.0%. -Start IV Ancef for cellulitis -ABI -Wound care consult -PT/OT eval  Peripheral vascular disease: Not able to palpate DP pulses. -ABI  Alcohol abuse: Reports drinking 3-4 beers a day.  Alcohol level 161 on admission.  No withdrawal symptoms. -Encouraged alcohol cessation/moderation -Serial with as needed Ativan. -Folic acid, thiamine and multivitamin -Consult TOC  Tobacco use disorder: Reports smoking for the 5 cigarettes a day. -Encouraged smoking cessation.  Lactic acidosis: Likely due to alcohol.  Resolved.  Macrocytosis: -Check anemia panel    Body mass index is 28.1 kg/m.          DVT prophylaxis:  enoxaparin (LOVENOX) injection 40 mg Start: 10/21/22 2215  Code Status: Full code Family Communication: None at bedside Level of care: Med-Surg Status is: Inpatient Remains inpatient appropriate because: Right foot ulcer/possible PVD   Final disposition: TBD Consultants:  None  35 minutes with more than 50% spent in reviewing records, counseling patient/family and coordinating care.   Sch Meds:  Scheduled Meds:  enoxaparin (LOVENOX) injection  40 mg Subcutaneous [id number]   folic acid  1 mg Oral Daily   multivitamin with minerals  1 tablet  Oral Daily   thiamine  100 mg Oral Daily   Or   thiamine  100 mg Intravenous Daily   Continuous Infusions:   ceFAZolin (ANCEF) IV     PRN Meds:.LORazepam, morphine injection, ondansetron **OR** ondansetron (ZOFRAN) IV  Antimicrobials: Anti-infectives (From admission, onward)    Start     Dose/Rate Route Frequency Ordered Stop   10/22/22 1400  ceFAZolin (ANCEF) IVPB 2g/100 mL premix        2 g 200 mL/hr over 30 Minutes Intravenous Every 8 hours 10/22/22 1021 10/29/22 1359   10/21/22 2000  vancomycin (VANCOREADY) IVPB 1500 mg/300 mL       See Hyperspace for full Linked Orders Report.   1,500 mg 150 mL/hr over 120 Minutes Intravenous  Once 10/21/22 1954 10/22/22 0035   10/21/22 2000  vancomycin (VANCOCIN) IVPB 1000 mg/200 mL premix       See Hyperspace for full Linked Orders Report.   1,000 mg 200 mL/hr over 60 Minutes Intravenous  Once 10/21/22 1954 10/22/22 0257        I have personally reviewed the following labs and images: CBC: Recent Labs  Lab 10/21/22 1700 10/21/22 2157 10/22/22 0427  WBC 3.6* 4.5 4.4  HGB 13.3 11.9* 11.5*  HCT 41.0 34.8* 33.8*  MCV 109.0* 107.7* 107.3*  PLT 310 279 266   BMP &GFR Recent Labs  Lab 10/21/22 1700 10/21/22 2157 10/22/22 0427  NA 136  --  135  K 3.6  --  3.6  CL 103  --  104  CO2 23  --  22  GLUCOSE 78  --  110*  BUN <5*  --  <5*  CREATININE 0.56* 0.61 0.64  CALCIUM 8.8*  --  8.6*  MG 1.9  --   --    Estimated Creatinine Clearance: 124 mL/min (by C-G formula based on SCr of 0.64 mg/dL). Liver & Pancreas: Recent Labs  Lab 10/21/22 1700 10/22/22 0427  AST 41 31  ALT 24 21  ALKPHOS 77 72  BILITOT 0.6 1.0  PROT 6.4* 5.8*  ALBUMIN 3.1* 2.9*   Recent Labs  Lab 10/21/22 1700  LIPASE 41   No results for input(s): "AMMONIA" in the last 168 hours. Diabetic: No results for input(s): "HGBA1C" in the last 72 hours. No results for input(s): "GLUCAP" in the last 168 hours. Cardiac Enzymes: No results for input(s):  "CKTOTAL", "CKMB", "CKMBINDEX", "TROPONINI" in the last 168 hours. No results for input(s): "PROBNP" in the last 8760 hours. Coagulation Profile: No results for input(s): "INR", "PROTIME" in the last 168 hours. Thyroid Function Tests: No results for input(s): "TSH", "T4TOTAL", "FREET4", "T3FREE", "THYROIDAB" in the last 72 hours. Lipid Profile: Recent Labs    10/22/22 0718  CHOL 180  HDL 98  LDLCALC 69  TRIG 65  CHOLHDL 1.8   Anemia Panel: No results for input(s): "VITAMINB12", "FOLATE", "FERRITIN", "TIBC", "IRON", "RETICCTPCT" in the last 72 hours. Urine analysis:    Component Value Date/Time  COLORURINE YELLOW 10/21/2022 [place] 10/21/2022 1551   LABSPEC 1.003 (L) 10/21/2022 1551   PHURINE 5.0 10/21/2022 1551   GLUCOSEU NEGATIVE 10/21/2022 1551   HGBUR NEGATIVE 10/21/2022 [place] 10/21/2022 [place] 10/21/2022 1551   PROTEINUR NEGATIVE 10/21/2022 1551   NITRITE NEGATIVE 10/21/2022 [place] 10/21/2022 1551   Sepsis Labs: Invalid input(s): "PROCALCITONIN", "LACTICIDVEN"  Microbiology: Recent Results (from the past 240 hour(s))  Resp panel by RT-PCR (RSV, Flu A&B, Covid) Anterior Nasal Swab     Status: None   Collection Time: 10/21/22  6:10 PM   Specimen: Anterior Nasal Swab  Result Value Ref Range Status   SARS Coronavirus 2 by RT PCR NEGATIVE NEGATIVE Final    Comment: (NOTE) SARS-CoV-2 target nucleic acids are NOT DETECTED.  The SARS-CoV-2 RNA is generally detectable in upper respiratory specimens during the acute phase of infection. The lowest concentration of SARS-CoV-2 viral copies this assay can detect is 138 copies/mL. A negative result does not preclude SARS-Cov-2 infection and should not be used as the sole basis for treatment or other patient management decisions. A negative result may occur with  improper specimen collection/handling, submission of specimen other than  nasopharyngeal swab, presence of viral mutation(s) within the areas targeted by this assay, and inadequate number of viral copies(<138 copies/mL). A negative result must be combined with clinical observations, patient history, and epidemiological information. The expected result is Negative.  Fact Sheet for Patients:  EntrepreneurPulse.com.au  Fact Sheet for Healthcare Providers:  IncredibleEmployment.be  This test is no t yet approved or cleared by the [place] FDA and  has been authorized for detection and/or diagnosis of SARS-CoV-2 by FDA under an Emergency Use Authorization (EUA). This EUA will remain  in effect (meaning this test can be used) for the duration of the COVID-19 declaration under Section 564(b)(1) of the Act, 21 U.S.C.section 360bbb-3(b)(1), unless the authorization is terminated  or revoked sooner.       Influenza A by PCR NEGATIVE NEGATIVE Final   Influenza B by PCR NEGATIVE NEGATIVE Final    Comment: (NOTE) The Xpert Xpress SARS-CoV-2/FLU/RSV plus assay is intended as an aid in the diagnosis of influenza from Nasopharyngeal swab specimens and should not be used as a sole basis for treatment. Nasal washings and aspirates are unacceptable for Xpert Xpress SARS-CoV-2/FLU/RSV testing.  Fact Sheet for Patients: EntrepreneurPulse.com.au  Fact Sheet for Healthcare Providers: IncredibleEmployment.be  This test is not yet approved or cleared by the [place] FDA and has been authorized for detection and/or diagnosis of SARS-CoV-2 by FDA under an Emergency Use Authorization (EUA). This EUA will remain in effect (meaning this test can be used) for the duration of the COVID-19 declaration under Section 564(b)(1) of the Act, 21 U.S.C. section 360bbb-3(b)(1), unless the authorization is terminated or revoked.     Resp Syncytial Virus by PCR NEGATIVE NEGATIVE Final    Comment:  (NOTE) Fact Sheet for Patients: EntrepreneurPulse.com.au  Fact Sheet for Healthcare Providers: IncredibleEmployment.be  This test is not yet approved or cleared by the [place] FDA and has been authorized for detection and/or diagnosis of SARS-CoV-2 by FDA under an Emergency Use Authorization (EUA). This EUA will remain in effect (meaning this test can be used) for the duration of the COVID-19 declaration under Section 564(b)(1) of the Act, 21 U.S.C. section 360bbb-3(b)(1), unless the authorization is terminated or revoked.  Performed at [facility], [place], [place]  [address]     Radiology Studies: MR FOOT RIGHT W WO CONTRAST  Result Date: 10/22/2022 CLINICAL DATA:  Foot swelling, diabetic, osteomyelitis suspected, no prior imaging EXAM: MRI OF THE RIGHT FOREFOOT WITHOUT AND WITH CONTRAST TECHNIQUE: Multiplanar, multisequence MR imaging of the right forefoot was performed before and after the administration of intravenous contrast. CONTRAST:  20m GADAVIST GADOBUTROL 1 MMOL/ML IV SOLN COMPARISON:  X-ray 10/21/2022, MRI 10/06/2022 FINDINGS: Technical Note: Despite efforts by the technologist and patient, motion artifact is present on today's exam and could not be eliminated. This reduces exam sensitivity and specificity. Bones/Joint/Cartilage Prior second ray amputation at the level of the second metatarsal neck. Prior amputation of the third metatarsal head and neck and third toe proximal phalanx. Remaining osseous structures are intact without fracture or dislocation. Within the limitations of this motion degraded exam, there is no abnormal bone marrow edema or marrow replacement to suggest osteomyelitis. No sizable joint effusion to suggest septic arthritis. Similar bony alignment with hallux valgus deformity and degenerative changes. Ligaments Grossly intact Lisfranc ligament. Muscles and Tendons Chronic denervation changes  of the foot musculature. No tenosynovitis. Soft tissues Dorsal subcutaneous edema with enhancement of the forefoot. No organized or drainable fluid collections. IMPRESSION: 1. Motion degraded exam. 2. Dorsal subcutaneous edema with enhancement of the forefoot suggesting cellulitis. No organized or drainable fluid collections. 3. No evidence of osteomyelitis or septic arthritis. Electronically Signed   By: NDavina PokeD.O.   On: 10/22/2022 09:42   DG Chest Port 1 View  Result Date: 10/21/2022 CLINICAL DATA:  Weakness. EXAM: PORTABLE CHEST 1 VIEW COMPARISON:  Chest radiographs 10/06/2022, 11/27/2021 FINDINGS: Cardiac silhouette and mediastinal contours are within normal limits. Mild calcification within the aortic arch. Mild left greater right basilar horizontal linear scarring is unchanged. No pleural effusion or pneumothorax. No acute skeletal abnormality. IMPRESSION: 1. No active disease. Mild bibasilar scarring, unchanged from prior. 2. Aortic atherosclerosis. Electronically Signed   By: RYvonne KendallM.D.   On: 10/21/2022 18:22   DG Foot Complete Right  Result Date: 10/21/2022 CLINICAL DATA:  Ulcer.  Evaluate for osteomyelitis. EXAM: RIGHT FOOT COMPLETE - 3+ VIEW COMPARISON:  Right foot radiographs 06/29/2021 FINDINGS: Postsurgical changes are again seen of amputation of the second and third digits to the distal metatarsal shafts. There is again some bone remaining just distal and dorsal to the postsurgical third metatarsal amputation site, likely a portion of the middle phalanx and the distal phalanx of the third toe. This is unchanged. Mild hallux valgus. High-grade lateral angulation of the distal phalanx of the great toe and toenail, unchanged. Moderate plantar calcaneal heel spur. No radiographic evidence of significant soft tissue ulcer is seen. No new cortical erosion is seen. IMPRESSION: Redemonstration of second and third digit amputations, similar to prior. No new cortical erosion is seen.  Electronically Signed   By: RYvonne KendallM.D.   On: 10/21/2022 18:20   CT Head Wo Contrast  Result Date: 10/21/2022 CLINICAL DATA:  Dizziness. EXAM: CT HEAD WITHOUT CONTRAST TECHNIQUE: Contiguous axial images were obtained from the base of the skull through the vertex without intravenous contrast. RADIATION DOSE REDUCTION: This exam was performed according to the departmental dose-optimization program which includes automated exposure control, adjustment of the mA and/or kV according to patient size and/or use of iterative reconstruction technique. COMPARISON:  October 06, 2022 FINDINGS: Brain: There is mild cerebral atrophy with widening of the extra-axial spaces and ventricular dilatation. There are areas of decreased attenuation within the white matter tracts  of the supratentorial brain, consistent with microvascular disease changes. Vascular: There is marked severity calcification of the bilateral cavernous carotid arteries. Skull: Normal. Negative for fracture or focal lesion. Sinuses/Orbits: Chronic and/or postoperative changes are seen along the medial walls of the bilateral maxillary sinuses. Other: None. IMPRESSION: 1. No acute intracranial abnormality. 2. Generalized cerebral atrophy with chronic white matter small vessel ischemic changes. Electronically Signed   By: Virgina Norfolk M.D.   On: 10/21/2022 17:51      Jquan Egelston T. Lititz  If 7PM-7AM, please contact night-coverage [email or web address] 10/22/2022, 11:18 AM

## 2022-10-22 NOTE — Evaluation (Signed)
Physical Therapy Evaluation Patient Details Name: [name] [name] MRN: [id number] DOB: Jul 15, 1961 Today's Date: 10/22/2022  History of Present Illness  Pt is a 62 y/o M admitted on 10/21/22 after presenting with c/o R foot pain, fatigue & weakness. RLE concerning for cellulitis. PMH: PVD, medication noncompliance, anemia of chronic disease, L great toe & R 2nd toe amputation, tobacco & alcohol use disorder  Clinical Impression  Pt seen for PT evaluation with pt agreeable, received in bathroom. Co-tx with OT 2/2 unsure of pt's willingness to participate in 2 sessions. Pt reports after last admission he returned home alone to his father's house (he has passed away) & daughter was not assisting like he thought she would. Pt also notes he has not showered since Tuesday of last week. Pt without RLE post-op shoe with him. Pt is able to complete STS with min assist from low toilet with grab bar & ambulate to recliner with RW & CGA but does push RW out in front of him. PT educated pt on importance of optimizing his health & caring for RLE; pt noted to have dark/black skin on area near R toes. Recommend STR upon d/c to maximize independence with mobility & reduce fall risk prior to return home alone. Also requested order from MD for new post op shoe.        Recommendations for follow up therapy are one component of a multi-disciplinary discharge planning process, led by the attending physician.  Recommendations may be updated based on patient status, additional functional criteria and insurance authorization.  Follow Up Recommendations Skilled nursing-short term rehab (<3 hours/day) Can patient physically be transported by private vehicle: Yes    Assistance Recommended at Discharge Intermittent Supervision/Assistance  Patient can return home with the following  A little help with walking and/or transfers;A little help with bathing/dressing/bathroom;Assistance with cooking/housework;Assist for  transportation;Help with stairs or ramp for entrance    Equipment Recommendations None recommended by PT  Recommendations for Other Services       Functional Status Assessment Patient has had a recent decline in their functional status and demonstrates the ability to make significant improvements in function in a reasonable and predictable amount of time.     Precautions / Restrictions Precautions Precautions: Fall Restrictions Weight Bearing Restrictions: No RLE Weight Bearing: Weight bearing as tolerated Other Position/Activity Restrictions: in surgical shoe per podiatry      Mobility  Bed Mobility               General bed mobility comments: not tested, pt received in bathroom, left sitting in recliner    Transfers Overall transfer level: Needs assistance Equipment used: Rolling walker (2 wheels) Transfers: Sit to/from Stand Sit to Stand: Min assist           General transfer comment: STS from low toilet with min assist & grab bar    Ambulation/Gait Ambulation/Gait assistance: Min guard Gait Distance (Feet): 20 Feet Assistive device: Rolling walker (2 wheels) Gait Pattern/deviations: Decreased step length - right, Decreased step length - left, Decreased stride length, Decreased dorsiflexion - left, Decreased dorsiflexion - right Gait velocity: decreased     General Gait Details: Pt pushes RW out in front of him with trunk flexed. BLE genu varus.  Stairs            Wheelchair Mobility    Modified Rankin (Stroke Patients Only)       Balance Overall balance assessment: Needs assistance Sitting-balance support: Feet supported Sitting balance-Leahy Scale: Good  Standing balance support: Bilateral upper extremity supported, During functional activity, Reliant on assistive device for balance Standing balance-Leahy Scale: Fair                               Pertinent Vitals/Pain Pain Assessment Pain Assessment: No/denies pain     Home Living Family/patient expects to be discharged to:: Private residence Living Arrangements:  (brother) Available Help at Discharge: Family;Available PRN/intermittently Type of Home: House Home Access: Stairs to enter   Entrance Stairs-Number of Steps: 3     Home Equipment: Conservation officer, nature (2 wheels);Cane - single point Additional Comments: Information obtained from previous chart entry.    Prior Function Prior Level of Function : Independent/Modified Independent             Mobility Comments: Pt was ambulatory with RW at d/c from last admission.       Hand Dominance        Extremity/Trunk Assessment   Upper Extremity Assessment Upper Extremity Assessment: Overall WFL for tasks assessed    Lower Extremity Assessment Lower Extremity Assessment: Overall WFL for tasks assessed (Pt with black area noted near toes of R foot.)       Communication   Communication: No difficulties  Cognition Arousal/Alertness: Awake/alert Behavior During Therapy: Flat affect Overall Cognitive Status: Within Functional Limits for tasks assessed Area of Impairment: Memory, Following commands, Safety/judgement, Awareness, Problem solving, Orientation, Attention                 Orientation Level: Disoriented to, Time (disoriented to month & year)   Memory: Decreased short-term memory Following Commands: Follows one step commands with increased time Safety/Judgement: Decreased awareness of safety, Decreased awareness of deficits Awareness: Anticipatory, Emergent Problem Solving: Slow processing, Requires verbal cues, Requires tactile cues General Comments: Pt presents with decreased safety awareness & decreased overall awareness. Reports he has not showered since Tuesday of last week, returned home alone (father's house) & daughter did not assist after most recent hospital d/c.        General Comments General comments (skin integrity, edema, etc.): HR 112-114 bpm on toilet &  in recliner at end of session, OT reports pt is diaphoretic.    Exercises     Assessment/Plan    PT Assessment Patient needs continued PT services  PT Problem List Decreased strength;Decreased balance;Decreased mobility;Decreased activity tolerance;Decreased knowledge of use of DME;Decreased safety awareness;Decreased skin integrity       PT Treatment Interventions DME instruction;Therapeutic exercise;Balance training;Gait training;Neuromuscular re-education;Stair training;Functional mobility training;Therapeutic activities;Patient/family education    PT Goals (Current goals can be found in the Care Plan section)  Acute Rehab PT Goals Patient Stated Goal: none stated PT Goal Formulation: With patient Time For Goal Achievement: 11/05/22 Potential to Achieve Goals: Good    Frequency Min 2X/week     Co-evaluation PT/OT/SLP Co-Evaluation/Treatment: Yes Reason for Co-Treatment: Necessary to address cognition/behavior during functional activity PT goals addressed during session: Mobility/safety with mobility;Balance;Proper use of DME         AM-PAC PT "6 Clicks" Mobility  Outcome Measure Help needed turning from your back to your side while in a flat bed without using bedrails?: None Help needed moving from lying on your back to sitting on the side of a flat bed without using bedrails?: None Help needed moving to and from a bed to a chair (including a wheelchair)?: A Little Help needed standing up from a chair using your arms (e.g., wheelchair  or bedside chair)?: A Little Help needed to walk in hospital room?: A Little Help needed climbing 3-5 steps with a railing? : A Little 6 Click Score: 20    End of Session   Activity Tolerance: Patient tolerated treatment well (limited 2/2 not having RLE post op shoe) Patient left: in chair;with chair alarm set;with call bell/phone within reach Nurse Communication: Mobility status PT Visit Diagnosis: Other abnormalities of gait and  mobility (R26.89);Muscle weakness (generalized) (M62.81);Difficulty in walking, not elsewhere classified (R26.2);Unsteadiness on feet (R26.81)    Time: [id number] PT Time Calculation (min) (ACUTE ONLY): 13 min   Charges:   PT Evaluation $PT Eval Low Complexity: In[place], PT, DPT 10/22/22, 2:56 PM   Waunita Schooner 10/22/2022, 2:54 PM

## 2022-10-22 NOTE — Consult Note (Signed)
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 Seen by this Probation officer and Dr. Marilynn Rail (Podiatric Medicine) earlier this month during a previous admission. Please see notes from those encounters.See photodocumentation of wound provided by EDP this morning. Wound type:Full thickness neuropathic Pressure Injury POA: N/A Measurement:1cm x 1.5cm with depth unable to be determined due to the presence of nonviable tissue in wound bed. Wound bed:80% red, 20% yellow Drainage (amount, consistency, odor) moderate light yellow consistent with autolysis of nonviable slough Periwound:intact Dressing procedure/placement/frequency: I will continue the POC recommended by Dr. Amalia Hailey during last admission I.e., daily cleanse followed by Aquacel Ag+ Advantage dressing secured with silicone foam.  A sacral foam for prevention of pressure injury in that area is recommended.  [place] nursing team will not follow, but will remain available to this patient, the nursing and medical teams.  Please re-consult if needed.  Thank you for inviting [place] to participate in this patient's Plan of Care.  Maudie Flakes, MSN, RN, CNS, [place], Serita Grammes, [employer], [employer] phone:  [phone]

## 2022-10-22 NOTE — Evaluation (Signed)
Occupational Therapy Evaluation Patient Details Name: [name] [name] MRN: [id number] DOB: Oct 29, 1960 Today's Date: 10/22/2022   History of Present Illness Pt is a 62 y/o M admitted on 10/21/22 after presenting with c/o R foot pain, fatigue & weakness. RLE concerning for cellulitis. 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 Co tx completed with PT on this date- pt is noted to be in bathroom having BM after uncontrolled bowel movement which is covering his bed linens.Per Podiatry note from last admission, RLE WBAT in post op shoe- pt reports he has shoe but then upon further investigation pt has been wearing his boots and no post op shoe since last admission. Pt is alert, oriented to self, place, grossly to situation (poor awareness of deficits overall), not oriented to date. Pt appears that he has not bathed in some time, reports he has not showered since last week. Pt is a questionable historian. Of note, pt participated in SLUMS assessment last admission and scored a 15/30  with deficits noted in delayed recall,  Numeric calculation and registration, and Executive function plus extrapolation. Processing and safety deficits noted during functional activities.  Pt presents with deficits in cognition, safety awareness, activity tolerance, balance affecting safe and optimal ADL completion. Would recommend discharge to STR to address functional deficits at this time. OT will continue to follow acutely.      Recommendations for follow up therapy are one component of a multi-disciplinary discharge planning process, led by the attending physician.  Recommendations may be updated based on patient status, additional functional criteria and insurance authorization.   Follow Up Recommendations  Skilled nursing-short term rehab (<3  hours/day)     Assistance Recommended at Discharge Frequent or constant Supervision/Assistance  Patient can return home with the following A little help with walking and/or transfers;A little help with bathing/dressing/bathroom;Direct supervision/assist for financial management;Direct supervision/assist for medications management;Help with stairs or ramp for entrance;Assistance with cooking/housework    Functional Status Assessment  Patient has had a recent decline in their functional status and demonstrates the ability to make significant improvements in function in a reasonable and predictable amount of time.  Equipment Recommendations  BSC/3in1;Other (comment) (2WW)    Recommendations for Other Services       Precautions / Restrictions Precautions Precautions: Fall Restrictions Weight Bearing Restrictions: No RLE Weight Bearing: Weight bearing as tolerated Other Position/Activity Restrictions: in surgical shoe per podiatry last admission      Mobility Bed Mobility               General bed mobility comments: NT pt in bathroom at start of session, in recliner post session    Transfers Overall transfer level: Needs assistance Equipment used: Rolling walker (2 wheels) Transfers: Sit to/from Stand Sit to Stand: Min assist                  Balance Overall balance assessment: Needs assistance Sitting-balance support: Feet supported Sitting balance-Leahy Scale: Good     Standing balance support: Bilateral upper extremity supported, During functional activity, Reliant on assistive device for balance Standing balance-Leahy Scale: Fair                             ADL either performed or assessed with clinical judgement   ADL Overall ADL's :  Needs assistance/impaired     Grooming: Wash/dry hands;Sitting;Set up       Lower Body Bathing: Maximal assistance   Upper Body Dressing : Supervision/safety;Sitting   Lower Body Dressing:  Supervision/safety;Set up;Sitting/lateral leans Lower Body Dressing Details (indicate cue type and reason): donn/doff socks Toilet Transfer: Min guard;Minimal assistance;Rolling walker (2 wheels);Ambulation;Regular Toilet   Toileting- Clothing Manipulation and Hygiene: Maximal assistance;Sit to/from stand Toileting - Clothing Manipulation Details (indicate cue type and reason): for thoroughness after uncontrolled BM     Functional mobility during ADLs: Min guard;Rolling walker (2 wheels)       Vision Patient Visual Report: No change from baseline       Perception     Praxis      Pertinent Vitals/Pain Pain Assessment Pain Assessment: No/denies pain     Hand Dominance     Extremity/Trunk Assessment Upper Extremity Assessment Upper Extremity Assessment: Overall WFL for tasks assessed   Lower Extremity Assessment Lower Extremity Assessment: Defer to PT evaluation       Communication Communication Communication: No difficulties   Cognition Arousal/Alertness: Awake/alert Behavior During Therapy: Flat affect Overall Cognitive Status: No family/caregiver present to determine baseline cognitive functioning Area of Impairment: Memory, Following commands, Safety/judgement, Awareness, Problem solving, Orientation, Attention                 Orientation Level: Disoriented to, Time, Situation Current Attention Level: Selective Memory: Decreased short-term memory Following Commands: Follows one step commands with increased time Safety/Judgement: Decreased awareness of safety, Decreased awareness of deficits Awareness: Emergent Problem Solving: Slow processing, Requires verbal cues, Requires tactile cues       General Comments  HR 112-120 at rest, pt appear diaphoretic; Pt is amb in room wtih post op shoe, reports he does not have shoe and has been wearing his boots; Pt with noted break down/black areas on dorsal surface of R foot    Exercises Other Exercises Other  Exercises: edu re: role of OT, role of rehab, discharge recommendations, home safety, falls prevention   Shoulder Instructions      Home Living Family/patient expects to be discharged to:: Private residence Living Arrangements: Other (Comment) (pt reports he lives with his brother) Available Help at Discharge: Family;Available PRN/intermittently Type of Home: House Home Access: Stairs to enter Entrance Stairs-Number of Steps: 3   Home Layout: One level               Home Equipment: Conservation officer, nature (2 wheels);Cane - single point   Additional Comments: information from previous chart entry however pt is a poor historian and per conversation wtih TOC brother reports patient lives alone, home set up will need to be confirmed      Prior Functioning/Environment Prior Level of Function : Independent/Modified Independent             Mobility Comments: amb with RW ADLs Comments: pt reports MOD I in ADL/IADL, questionable if pt has been managing appropriately prior to admission- pt with significant body odor, dirt on his body, poor performance during ADL task completion        OT Problem List: Decreased strength;Decreased activity tolerance;Impaired balance (sitting and/or standing);Decreased cognition;Decreased knowledge of precautions;Decreased knowledge of use of DME or AE      OT Treatment/Interventions: Self-care/ADL training;Patient/family education;Therapeutic exercise;Balance training;Energy conservation;Therapeutic activities;DME and/or AE instruction;Cognitive remediation/compensation    OT Goals(Current goals can be found in the care plan section) Acute Rehab OT Goals Patient Stated Goal: go home OT Goal Formulation: With patient Time For Goal  Achievement: 11/05/22 Potential to Achieve Goals: Fair ADL Goals Pt Will Perform Grooming: with modified independence;standing Pt Will Perform Lower Body Dressing: with modified independence Pt Will Transfer to Toilet: with  modified independence;ambulating Pt Will Perform Toileting - Clothing Manipulation and hygiene: with modified independence;sit to/from stand  OT Frequency: Min 2X/week    Co-evaluation PT/OT/SLP Co-Evaluation/Treatment: Yes Reason for Co-Treatment: Necessary to address cognition/behavior during functional activity PT goals addressed during session: Mobility/safety with mobility;Balance;Proper use of DME OT goals addressed during session: ADL's and self-care;Proper use of Adaptive equipment and DME      AM-PAC OT "6 Clicks" Daily Activity     Outcome Measure Help from another person eating meals?: None Help from another person taking care of personal grooming?: A Little Help from another person toileting, which includes using toliet, bedpan, or urinal?: A Little Help from another person bathing (including washing, rinsing, drying)?: A Lot Help from another person to put on and taking off regular upper body clothing?: None Help from another person to put on and taking off regular lower body clothing?: A Little 6 Click Score: 19   End of Session Equipment Utilized During Treatment: Rolling walker (2 wheels) Nurse Communication: Mobility status  Activity Tolerance: Patient tolerated treatment well Patient left: in chair;with call bell/phone within reach;with chair alarm set (with instructions to use post op shoe for back to bed)  OT Visit Diagnosis: Unsteadiness on feet (R26.81);Muscle weakness (generalized) (M62.81);Other abnormalities of gait and mobility (R26.89)                Time: [id number] OT Time Calculation (min): 11 min Charges:  OT General Charges $OT Visit: 1 Visit OT Evaluation $OT Eval Low Complexity: 1 Low  Shanon Payor, OTD OTR/L  10/22/22, 3:53 PM

## 2022-10-22 NOTE — Progress Notes (Signed)
APS report made with [place]. Its unknown at this time if the report will be screened in or out

## 2022-10-22 NOTE — Progress Notes (Signed)
CSW spoke with patient who stated he does not want any substance abuse resources. Patient stated he is not interested in being sober. CSW did ask patient about showering and he stated he has access to take a shower but hasn't. CSW spoke with patients brother [name], [phone] who stated that his brother will tell hospital staff he lives with him but that isn't true. Patients brother said patient has a nice house but he pees and poops on the floor. [name] [name] stated the floor is rotted due to the feces and urine. Patients brother stated he does this when he gets drunk. Patients brother states he gets drunk on the hour and also will pee in the seat of his car and then drive around in it. The brother told CSW that patient hasn't showered in 10 years. CSW asked if patient has a phobia or past trauma and patients brother stated he believes so. Patients brother stated he has running water but chooses not to shower. [name] [name] told CSW that anything we recommend patient will not do. [name] [name] stated he tried to get his brother to go to the doctor about his feet but he refuses.   CSW did inform [name] [name] she would make an APS report but not sure if they will take the report. [name] [name] told CSW to make sure his information is on the report. CSW left a message with [facility] non-emergency to have APS contact CSW.

## 2022-10-23 DIAGNOSIS — R5381 Other malaise: Secondary | ICD-10-CM | POA: Insufficient documentation

## 2022-10-23 DIAGNOSIS — F1010 Alcohol abuse, uncomplicated: Secondary | ICD-10-CM | POA: Diagnosis not present

## 2022-10-23 DIAGNOSIS — L03115 Cellulitis of right lower limb: Secondary | ICD-10-CM | POA: Diagnosis not present

## 2022-10-23 DIAGNOSIS — L97512 Non-pressure chronic ulcer of other part of right foot with fat layer exposed: Secondary | ICD-10-CM | POA: Diagnosis not present

## 2022-10-23 DIAGNOSIS — E11621 Type 2 diabetes mellitus with foot ulcer: Secondary | ICD-10-CM | POA: Diagnosis not present

## 2022-10-23 DIAGNOSIS — R4689 Other symptoms and signs involving appearance and behavior: Secondary | ICD-10-CM | POA: Insufficient documentation

## 2022-10-23 LAB — IRON AND TIBC
Iron: 57 ug/dL (ref 45–182)
Saturation Ratios: 22 % (ref 17.9–39.5)
TIBC: 259 ug/dL (ref 250–450)
UIBC: 202 ug/dL

## 2022-10-23 LAB — CBC
HCT: 34.9 % — ABNORMAL LOW (ref 39.0–52.0)
Hemoglobin: 11.6 g/dL — ABNORMAL LOW (ref 13.0–17.0)
MCH: 35.7 pg — ABNORMAL HIGH (ref 26.0–34.0)
MCHC: 33.2 g/dL (ref 30.0–36.0)
MCV: 107.4 fL — ABNORMAL HIGH (ref 80.0–100.0)
Platelets: 246 10*3/uL (ref 150–400)
RBC: 3.25 MIL/uL — ABNORMAL LOW (ref 4.22–5.81)
RDW: 12.9 % (ref 11.5–15.5)
WBC: 4.4 10*3/uL (ref 4.0–10.5)
nRBC: 0 % (ref 0.0–0.2)

## 2022-10-23 LAB — RENAL FUNCTION PANEL
Albumin: 3.1 g/dL — ABNORMAL LOW (ref 3.5–5.0)
Anion gap: 8 (ref 5–15)
BUN: 5 mg/dL — ABNORMAL LOW (ref 8–23)
CO2: 24 mmol/L (ref 22–32)
Calcium: 8.8 mg/dL — ABNORMAL LOW (ref 8.9–10.3)
Chloride: 105 mmol/L (ref 98–111)
Creatinine, Ser: 0.69 mg/dL (ref 0.61–1.24)
GFR, Estimated: >60 mL/min (ref >60)
Glucose, Bld: 104 mg/dL — ABNORMAL HIGH (ref 70–99)
Phosphorus: 4.2 mg/dL (ref 2.5–4.6)
Potassium: 3.7 mmol/L (ref 3.5–5.1)
Sodium: 137 mmol/L (ref 135–145)

## 2022-10-23 LAB — HEMOGLOBIN A1C
Hgb A1c MFr Bld: 5.1 % (ref 4.8–5.6)
Mean Plasma Glucose: 100 mg/dL

## 2022-10-23 LAB — MAGNESIUM: Magnesium: 2.1 mg/dL (ref 1.7–2.4)

## 2022-10-23 LAB — RETICULOCYTES
Immature Retic Fract: 24.1 % — ABNORMAL HIGH (ref 2.3–15.9)
RBC.: 3.23 MIL/uL — ABNORMAL LOW (ref 4.22–5.81)
Retic Count, Absolute: 95.3 10*3/uL (ref 19.0–186.0)
Retic Ct Pct: 3 % (ref 0.4–3.1)

## 2022-10-23 LAB — FOLATE: Folate: 9.5 ng/mL (ref >5.9)

## 2022-10-23 LAB — VITAMIN B12: Vitamin B-12: 175 pg/mL — ABNORMAL LOW (ref 180–914)

## 2022-10-23 LAB — FERRITIN: Ferritin: 172 ng/mL (ref 24–336)

## 2022-10-23 LAB — LACTIC ACID, PLASMA: Lactic Acid, Venous: 1.2 mmol/L (ref 0.5–1.9)

## 2022-10-23 NOTE — Plan of Care (Signed)

## 2022-10-23 NOTE — TOC Progression Note (Signed)
Transition of Care [facility]) - Progression Note    Patient Details  Name: [name] [name] MRN: [id number] Date of Birth: 1961/05/22  Transition of Care [facility]) CM/SW [place], RN Phone Number: 10/23/2022, 10:42 AM  Clinical Narrative:    Spoke with the patient and explained the Bedsearch process to go to STR, he is agreeable, will review bed offers once obtained    Expected Discharge Plan: Skilled Nursing Facility Barriers to Discharge: SNF Pending bed offer  Expected Discharge Plan and Services   Discharge Planning Services: CM Consult   Living arrangements for the past 2 months: Single Family Home                                       Social Determinants of Health (SDOH) Interventions SDOH Screenings   Food Insecurity: No Food Insecurity (10/21/2022)  Recent Concern: [place] Present (10/07/2022)  Housing: Low Risk  (10/21/2022)  Transportation Needs: No Transportation Needs (10/21/2022)  Utilities: Not At Risk (10/21/2022)  Tobacco Use: High Risk (10/21/2022)    Readmission Risk Interventions     No data to display

## 2022-10-23 NOTE — Progress Notes (Signed)
PROGRESS NOTE  [name] [name] [id number] DOB: 08/18/1961   PCP: Pcp, No  Patient is from: Home.  Lives alone.  Uses cane at baseline.  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   In ED, stable vitals.  CMP and CBC without significant finding other than mild leukopenia with macrocytosis.  Lactic acid elevated to 3.0.  Troponin negative x 2. X-ray of the foot shows no new cortical conditions but second and third digit amputations.  Alcohol level 161.  Patient was given IV vancomycin.  MRI right foot negative for osteomyelitis but suggests this.  LE arterial duplex without significant PAD.  Antibiotic de-escalated to IV Ancef.  Therapy recommended SNF.   Subjective: Seen and examined earlier this morning.  No major events overnight of this morning.  No complaints.  Objective: Vitals:   10/22/22 1607 10/22/22 2348 10/23/22 0835 10/23/22 0900  BP: (!) 154/82 (!) 147/82 (!) 134/101 139/81  Pulse: (!) 104 (!) 109 92   Resp:  18 16   Temp:  98 F (36.7 C) 98.4 F (36.9 C)   TempSrc:   Oral   SpO2: 100% 99% 100%   Weight:      Height:        Examination:  GENERAL: No apparent distress.  Nontoxic. HEENT: MMM.  Vision and hearing grossly intact.  NECK: Supple.  No apparent JVD.  RESP:  No IWOB.  Fair aeration bilaterally. CVS:  RRR. Heart sounds normal.  ABD/GI/GU: BS+. Abd soft, NTND.  MSK/EXT:   Moves extremities. S/p left great toe and right second toe amputation.  Not able to palpate DP SKIN: Ulceration over dorsal aspect of right foot.  No purulent drainage or surrounding  erythema. NEURO: Awake and alert. Oriented appropriately.  No apparent focal neuro deficit. PSYCH: Calm. Normal affect.  Procedures:  None  Microbiology summarized: [id number], influenza and RSV PCR nonreactive.  Assessment and plan: Principal Problem:   Diabetic foot ulcer ([place]) Active Problems:   Tobacco abuse   Peripheral vascular disease ([place])   Alcohol abuse   Cellulitis of right lower extremity   Self neglect   Physical deconditioning  Right foot pain/cellulitis/diabetic foot ulcer: Presents with right foot pain.  Has no fever or leukocytosis.  Lactic acidosis likely from alcohol.  X-ray and MRI without abscess or osteomyelitis but concerning for cellulitis.  He has history of PVD.  Not able to palpate DP pulses.  LDL 69.  Not diabetic.  A1c 5.0%. -Start IV Ancef for cellulitis -ABI -Wound care consult -PT/OT eval  Peripheral vascular disease: LE arterial duplex negative for significant PAD.  Alcohol intoxication/withdrawal: Reports drinking 3-4 beers a day.  Alcohol level 161 on admission.  Has some tremors and tachycardia yesterday. -Encouraged alcohol cessation/moderation -Continue CIWA with as needed Ativan -Continue low-dose clonidine -Folic acid, thiamine and multivitamin -TOC consulted.  Tobacco use disorder: Reports smoking for the 5 cigarettes a day. -Encouraged smoking cessation.  Lactic acidosis: Likely due to alcohol.  Resolved.  Macrocytosis: -Check anemia panel  Self-neglect: Lives alone.  Brother concerned about patient drinking and not taking care of himself.  Reportedly not  showering and urinating and defecating on himself when he drinks. -TOC consulted  Physical deconditioning -Therapy recommended SNF    Body mass index is 28.1 kg/m.          DVT prophylaxis:  enoxaparin (LOVENOX) injection 40 mg Start: 10/21/22 2215  Code Status: Full code Family Communication: None at bedside Level of care: Med-Surg Status is: Inpatient Remains  inpatient appropriate because: Right foot ulcer/cellulitis   Final disposition: SNF Consultants:  None  35 minutes with more than 50% spent in reviewing records, counseling patient/family and coordinating care.   Sch Meds:  Scheduled Meds:  cloNIDine  0.1 mg Oral BID   enoxaparin (LOVENOX) injection  40 mg Subcutaneous [id number]   folic acid  1 mg Oral Daily   multivitamin with minerals  1 tablet Oral Daily   thiamine  100 mg Oral Daily   Or   thiamine  100 mg Intravenous Daily   Continuous Infusions:   ceFAZolin (ANCEF) IV 2 g (10/23/22 0607)   PRN Meds:.LORazepam, morphine injection, ondansetron **OR** ondansetron (ZOFRAN) IV  Antimicrobials: Anti-infectives (From admission, onward)    Start     Dose/Rate Route Frequency Ordered Stop   10/22/22 1400  ceFAZolin (ANCEF) IVPB 2g/100 mL premix        2 g 200 mL/hr over 30 Minutes Intravenous Every 8 hours 10/22/22 1021 10/29/22 1359   10/21/22 2000  vancomycin (VANCOREADY) IVPB 1500 mg/300 mL       See Hyperspace for full Linked Orders Report.   1,500 mg 150 mL/hr over 120 Minutes Intravenous  Once 10/21/22 1954 10/22/22 0035   10/21/22 2000  vancomycin (VANCOCIN) IVPB 1000 mg/200 mL premix       See Hyperspace for full Linked Orders Report.   1,000 mg 200 mL/hr over 60 Minutes Intravenous  Once 10/21/22 1954 10/22/22 0257        I have personally reviewed the following labs and images: CBC: Recent Labs  Lab 10/21/22 1700 10/21/22 2157 10/22/22 0427 10/23/22 0511  WBC 3.6* 4.5 4.4 4.4  HGB 13.3 11.9* 11.5* 11.6*  HCT 41.0 34.8* 33.8* 34.9*  MCV 109.0* 107.7* 107.3* 107.4*  PLT 310 279 266 246   BMP &GFR Recent Labs  Lab 10/21/22 1700 10/21/22 2157 10/22/22 0427 10/23/22 0511  NA 136  --  135 137  K 3.6  --  3.6 3.7  CL 103  --  104 105  CO2 23  --  22 24  GLUCOSE 78  --  110* 104*  BUN <5*  --  <5* 5*  CREATININE 0.56* 0.61 0.64 0.69  CALCIUM 8.8*  --  8.6* 8.8*  MG 1.9  --   --  2.1  PHOS  --   --    --  4.2   Estimated Creatinine Clearance: 124 mL/min (by C-G formula based on SCr of 0.69 mg/dL). Liver & Pancreas: Recent Labs  Lab 10/21/22 1700 10/22/22 0427 10/23/22 0511  AST 41 31  --   ALT 24 21  --   ALKPHOS 77 72  --   BILITOT 0.6 1.0  --   PROT 6.4* 5.8*  --   ALBUMIN 3.1* 2.9* 3.1*   Recent Labs  Lab 10/21/22 1700  LIPASE 41   No results for input(s): "AMMONIA" in the last 168 hours. Diabetic: Recent Labs    10/21/22 2157  HGBA1C 5.1   No results for input(s): "GLUCAP" in the last 168 hours. Cardiac Enzymes: No results for input(s): "CKTOTAL", "CKMB", "CKMBINDEX", "TROPONINI"  in the last 168 hours. No results for input(s): "PROBNP" in the last 8760 hours. Coagulation Profile: No results for input(s): "INR", "PROTIME" in the last 168 hours. Thyroid Function Tests: No results for input(s): "TSH", "T4TOTAL", "FREET4", "T3FREE", "THYROIDAB" in the last 72 hours. Lipid Profile: Recent Labs    10/22/22 0718  CHOL 180  HDL 98  LDLCALC 69  TRIG 65  CHOLHDL 1.8   Anemia Panel: Recent Labs    10/23/22 0511  VITAMINB12 175*  FOLATE 9.5  FERRITIN 172  TIBC 259  IRON 57  RETICCTPCT 3.0   Urine analysis:    Component Value Date/Time   COLORURINE YELLOW 10/21/2022 1551   APPEARANCEUR CLEAR 10/21/2022 1551   LABSPEC 1.003 (L) 10/21/2022 1551   PHURINE 5.0 10/21/2022 1551   GLUCOSEU NEGATIVE 10/21/2022 1551   HGBUR NEGATIVE 10/21/2022 1551   BILIRUBINUR NEGATIVE 10/21/2022 1551   KETONESUR NEGATIVE 10/21/2022 1551   PROTEINUR NEGATIVE 10/21/2022 1551   NITRITE NEGATIVE 10/21/2022 1551   LEUKOCYTESUR NEGATIVE 10/21/2022 1551   Sepsis Labs: Invalid input(s): "PROCALCITONIN", "LACTICIDVEN"  Microbiology: Recent Results (from the past 240 hour(s))  Resp panel by RT-PCR (RSV, Flu A&B, Covid) Anterior Nasal Swab     Status: None   Collection Time: 10/21/22  6:10 PM   Specimen: Anterior Nasal Swab  Result Value Ref Range Status   SARS Coronavirus 2  by RT PCR NEGATIVE NEGATIVE Final    Comment: (NOTE) SARS-CoV-2 target nucleic acids are NOT DETECTED.  The SARS-CoV-2 RNA is generally detectable in upper respiratory specimens during the acute phase of infection. The lowest concentration of SARS-CoV-2 viral copies this assay can detect is 138 copies/mL. A negative result does not preclude SARS-Cov-2 infection and should not be used as the sole basis for treatment or other patient management decisions. A negative result may occur with  improper specimen collection/handling, submission of specimen other than nasopharyngeal swab, presence of viral mutation(s) within the areas targeted by this assay, and inadequate number of viral copies(<138 copies/mL). A negative result must be combined with clinical observations, patient history, and epidemiological information. The expected result is Negative.  Fact Sheet for Patients:  EntrepreneurPulse.com.au  Fact Sheet for Healthcare Providers:  IncredibleEmployment.be  This test is no t yet approved or cleared by the [place] FDA and  has been authorized for detection and/or diagnosis of SARS-CoV-2 by FDA under an Emergency Use Authorization (EUA). This EUA will remain  in effect (meaning this test can be used) for the duration of the COVID-19 declaration under Section 564(b)(1) of the Act, 21 U.S.C.section 360bbb-3(b)(1), unless the authorization is terminated  or revoked sooner.       Influenza A by PCR NEGATIVE NEGATIVE Final   Influenza B by PCR NEGATIVE NEGATIVE Final    Comment: (NOTE) The Xpert Xpress SARS-CoV-2/FLU/RSV plus assay is intended as an aid in the diagnosis of influenza from Nasopharyngeal swab specimens and should not be used as a sole basis for treatment. Nasal washings and aspirates are unacceptable for Xpert Xpress SARS-CoV-2/FLU/RSV testing.  Fact Sheet for Patients: EntrepreneurPulse.com.au  Fact  Sheet for Healthcare Providers: IncredibleEmployment.be  This test is not yet approved or cleared by the [place] FDA and has been authorized for detection and/or diagnosis of SARS-CoV-2 by FDA under an Emergency Use Authorization (EUA). This EUA will remain in effect (meaning this test can be used) for the duration of the COVID-19 declaration under Section 564(b)(1) of the Act, 21 U.S.C. section 360bbb-3(b)(1), unless the authorization is terminated or  revoked.     Resp Syncytial Virus by PCR NEGATIVE NEGATIVE Final    Comment: (NOTE) Fact Sheet for Patients: EntrepreneurPulse.com.au  Fact Sheet for Healthcare Providers: IncredibleEmployment.be  This test is not yet approved or cleared by the [place] FDA and has been authorized for detection and/or diagnosis of SARS-CoV-2 by FDA under an Emergency Use Authorization (EUA). This EUA will remain in effect (meaning this test can be used) for the duration of the COVID-19 declaration under Section 564(b)(1) of the Act, 21 U.S.C. section 360bbb-3(b)(1), unless the authorization is terminated or revoked.  Performed at [facility], [address], [place], [place] [address]     Radiology Studies: No results found.    Alano Blasco T. [place]  If 7PM-7AM, please contact night-coverage [email or web address] 10/23/2022, 12:41 PM

## 2022-10-23 NOTE — NC FL2 (Signed)
[place] LEVEL OF CARE FORM     IDENTIFICATION  Patient Name: [name] [name] Birthdate: [birth year] Sex: male Admission Date (Current Location): 10/21/2022  [facility] and [place] Number:  Engineering geologist and Address:  [facility], [address] [place], [place] [address]      Provider Number: [id number]  Attending Physician Name and Address:  Mercy Riding, MD  Relative Name and Phone Number:  [name], Brother [phone]    Current Level of Care: Hospital Recommended Level of Care: [place] Prior Approval Number:    Date Approved/Denied:   PASRR Number: [id number]  Discharge Plan: SNF    Current Diagnoses: Patient Active Problem List   Diagnosis Date Noted   Cellulitis of right lower extremity 10/22/2022   Hepatic steatosis 10/07/2022   Altered mental status 10/06/2022   Hyponatremia 10/06/2022   Alcohol abuse 10/06/2022   Sepsis ([place]) 06/29/2021   Acute osteomyelitis of metatarsal bone of left foot ([place])    Diabetic infection of left foot ([place])    Diabetic foot ulcer ([place]) 01/11/2021   Tobacco use disorder    Peripheral vascular disease ([place])    Acute osteomyelitis of toe of right foot ([place]) 09/19/2016   Infection, [place] 09/08/2015   Anemia 09/08/2015   Cocaine abuse ([place]) 09/08/2015   Osteomyelitis ([place]) 09/03/2015   Hypokalemia 07/14/2015   Leukocytosis 07/14/2015   Anemia of chronic disease 07/14/2015   Tobacco abuse 07/14/2015   SIRS (systemic inflammatory response syndrome) ([place]) 07/11/2015   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  Wound bed:80% red, 20% yellow)                       Personal Care Assistance Level of Assistance  Bathing, Feeding, Dressing Bathing Assistance: Limited assistance Feeding assistance: Independent Dressing Assistance: Limited assistance     Functional Limitations Info             SPECIAL CARE FACTORS FREQUENCY                       Contractures Contractures Info: Not present    Additional Factors Info  Code Status, Allergies Code Status Info: Full code Allergies Info: NKDA           Current Medications (10/23/2022):  This is the current hospital active medication list Current Facility-Administered Medications  Medication Dose Route Frequency Provider Last Rate Last Admin   ceFAZolin (ANCEF) IVPB 2g/100 mL premix  2 g Intravenous Q8H Gonfa, Taye T, MD 200 mL/hr at 10/23/22 0607 2 g at 10/23/22 [id number]   cloNIDine (CATAPRES) tablet 0.1 mg  0.1 mg Oral BID Wendee Beavers T, MD   0.1 mg at 10/23/22 1014   enoxaparin (LOVENOX) injection 40 mg  40 mg Subcutaneous Q24H Gala Romney L, MD   40 mg at [id number]   folic acid (FOLVITE) tablet 1 mg  1 mg Oral Daily Elwyn Reach, MD   1 mg at 10/23/22  1014   LORazepam (ATIVAN) tablet 1-4 mg  1-4 mg Oral Q1H PRN Elwyn Reach, MD   1 mg at 10/22/22 1519   morphine (PF) 2 MG/ML injection 2 mg  2 mg Intravenous Q2H PRN Elwyn Reach, MD       multivitamin with minerals tablet 1 tablet  1 tablet Oral Daily Elwyn Reach, MD   1 tablet at 10/23/22 1014   ondansetron (ZOFRAN) tablet 4 mg  4 mg Oral Q6H PRN Elwyn Reach, MD       Or   ondansetron (ZOFRAN) injection 4 mg  4 mg Intravenous Q6H PRN Elwyn Reach, MD       thiamine (VITAMIN B1) tablet 100 mg  100 mg Oral Daily Gala Romney L, MD   100 mg at 10/23/22 1014   Or   thiamine (VITAMIN B1) injection 100 mg  100 mg Intravenous Daily Elwyn Reach, MD         Discharge Medications: Please see discharge summary for a list of discharge medications.  Relevant Imaging Results:  Relevant Lab Results:   Additional Information [id number]  Conception Oms, RN

## 2022-10-23 NOTE — Progress Notes (Signed)
Physical Therapy Treatment Patient Details Name: [name] [name] MRN: [id number] DOB: 04-29-1961 Today's Date: 10/23/2022   History of Present Illness Pt is a 62 y/o M admitted on 10/21/22 after presenting with c/o R foot pain, fatigue & weakness. RLE concerning for cellulitis. 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 Pt followed all commands well and reported no adverse symptoms during the session with SpO2 and HR WNL on room air.  Pt required extra time and effort with functional tasks along with cues for sequencing but required no physical assistance during the session.  Pt was generally steady with standing activities but with mod lean on the RW for support and stated that he did not feel steady enough to attempt standing without RW.  Pt remains at an elevated risk for falls and further functional decline and would not be safe to return to his prior living situation where he has no assistance available.  Pt will benefit from PT services in a SNF setting upon discharge to safely address deficits listed in patient problem list for decreased caregiver assistance and eventual return to PLOF.     Recommendations for follow up therapy are one component of a multi-disciplinary discharge planning process, led by the attending physician.  Recommendations may be updated based on patient status, additional functional criteria and insurance authorization.  Follow Up Recommendations  Skilled nursing-short term rehab (<3 hours/day) Can patient physically be transported by private vehicle: Yes   Assistance Recommended at Discharge Intermittent Supervision/Assistance  Patient can return home with the following A little help with walking and/or transfers;A little help with  bathing/dressing/bathroom;Assistance with cooking/housework;Assist for transportation;Help with stairs or ramp for entrance   Equipment Recommendations  None recommended by PT    Recommendations for Other Services       Precautions / Restrictions Precautions Precautions: Fall Restrictions Weight Bearing Restrictions: No RUE Weight Bearing: Weight bearing as tolerated Other Position/Activity Restrictions: in surgical shoe per podiatry last admission     Mobility  Bed Mobility Overal bed mobility: Modified Independent             General bed mobility comments: Min extra time and effort only    Transfers Overall transfer level: Needs assistance Equipment used: Rolling walker (2 wheels) Transfers: Sit to/from Stand Sit to Stand: Min guard, From elevated surface           General transfer comment: Extra time, effort, and EOB elevated to come to standing    Ambulation/Gait Ambulation/Gait assistance: Min guard Gait Distance (Feet): 20 Feet Assistive device: Rolling walker (2 wheels) Gait Pattern/deviations: Decreased step length - right, Decreased step length - left, Trunk flexed, Step-through pattern Gait velocity: decreased     General Gait Details: Slow cadence with short B step length and max verbal cues for amb closer to the RW with upright posture   Stairs             Wheelchair Mobility    Modified Rankin (Stroke Patients Only)       Balance Overall balance assessment: Needs assistance Sitting-balance support: Feet supported Sitting balance-Leahy Scale: Good     Standing balance support: Bilateral upper extremity supported, During functional activity, Reliant on assistive device for balance Standing balance-Leahy Scale: Fair  Cognition Arousal/Alertness: Awake/alert Behavior During Therapy: Flat affect Overall Cognitive Status: No family/caregiver present to determine baseline cognitive  functioning                                          Exercises Total Joint Exercises Ankle Circles/Pumps: AROM, Strengthening, Both, 10 reps, 5 reps Quad Sets: Strengthening, Both, 5 reps, 10 reps Gluteal Sets: Strengthening, Both, 5 reps, 10 reps Hip ABduction/ADduction: Strengthening, Both, 10 reps Straight Leg Raises: Strengthening, Both, 10 reps Long Arc Quad: Strengthening, Both, 10 reps Knee Flexion: Strengthening, Both, 10 reps    General Comments        Pertinent Vitals/Pain Pain Assessment Pain Assessment: No/denies pain    Home Living                          Prior Function            PT Goals (current goals can now be found in the care plan section) Progress towards PT goals: Progressing toward goals    Frequency    Min 2X/week      PT Plan Current plan remains appropriate    Co-evaluation              AM-PAC PT "6 Clicks" Mobility   Outcome Measure  Help needed turning from your back to your side while in a flat bed without using bedrails?: None Help needed moving from lying on your back to sitting on the side of a flat bed without using bedrails?: None Help needed moving to and from a bed to a chair (including a wheelchair)?: A Little Help needed standing up from a chair using your arms (e.g., wheelchair or bedside chair)?: A Little Help needed to walk in hospital room?: A Little Help needed climbing 3-5 steps with a railing? : A Little 6 Click Score: 20    End of Session Equipment Utilized During Treatment: Gait belt Activity Tolerance: Patient tolerated treatment well Patient left: in bed;with call bell/phone within reach;with bed alarm set;Other (comment) (Pt declined up in chair) Nurse Communication: Mobility status PT Visit Diagnosis: Other abnormalities of gait and mobility (R26.89);Muscle weakness (generalized) (M62.81);Difficulty in walking, not elsewhere classified (R26.2);Unsteadiness on feet  (R26.81)     Time: [id number] PT Time Calculation (min) (ACUTE ONLY): 23 min  Charges:  $Gait Training: 8-22 mins $Therapeutic Exercise: 8-22 mins                    D. Scott Mitsue Peery PT, DPT 10/23/22, 5:21 PM

## 2022-10-24 DIAGNOSIS — I739 Peripheral vascular disease, unspecified: Secondary | ICD-10-CM | POA: Diagnosis not present

## 2022-10-24 DIAGNOSIS — L03115 Cellulitis of right lower limb: Secondary | ICD-10-CM | POA: Diagnosis not present

## 2022-10-24 DIAGNOSIS — E11621 Type 2 diabetes mellitus with foot ulcer: Secondary | ICD-10-CM | POA: Diagnosis not present

## 2022-10-24 DIAGNOSIS — F1010 Alcohol abuse, uncomplicated: Secondary | ICD-10-CM | POA: Diagnosis not present

## 2022-10-24 MED ORDER — CEFADROXIL 500 MG PO CAPS: 1000.0000 mg | ORAL_CAPSULE | Freq: Two times a day (BID) | ORAL | 0 refills | Status: AC

## 2022-10-24 MED ORDER — CARVEDILOL 6.25 MG PO TABS: 6.2500 mg | ORAL_TABLET | Freq: Two times a day (BID) | ORAL | 11 refills | Status: AC

## 2022-10-24 MED ORDER — CYANOCOBALAMIN 1000 MCG/ML IJ SOLN
1000.0000 ug | Freq: Once | INTRAMUSCULAR | Status: AC
  Administered 2022-10-24: 1000 ug via INTRAMUSCULAR
  Filled 2022-10-24: qty 1

## 2022-10-24 MED ORDER — VITAMIN B-12 1000 MCG PO TABS: 1000.0000 ug | ORAL_TABLET | Freq: Every day | ORAL | Status: AC

## 2022-10-24 NOTE — Discharge Summary (Signed)
Physician Discharge Summary  [name] [name] [id number] DOB: 09/27/1960 DOA: 10/21/2022  PCP: Pcp, No  Admit date: 10/21/2022 Discharge date: 10/24/2022 Admitted From: Home Disposition: SNF Recommendations for Outpatient Follow-up:  Check CMP and CBC in 1 week Please follow up on the following pending results: None   Discharge Condition: Stable CODE STATUS: Full code  Contact information for after-discharge care     Destination     [facility] .   Service: Skilled Nursing Contact information: 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    In ED, stable vitals.  CMP and CBC without significant finding other than mild leukopenia with macrocytosis.  Lactic acid elevated to 3.0.  Troponin negative x 2. X-ray of the foot shows no new cortical conditions but second and third digit amputations.  Alcohol level 161.  Patient was given IV vancomycin.  MRI right foot negative for osteomyelitis but suggested cellulitis.  LE arterial duplex without significant PAD.  Antibiotic de-escalated to IV Ancef.  He is discharged on p.o. cefadroxil 1000 mg twice daily for 7 more days.  Wound care instruction as below.  Therapy recommended SNF.  In regards to alcohol abuse, he did not have significant withdrawal symptoms other than some tremor and tachycardia earlier during hospitalization.  He did not require Ativan for almost 48 hours.  Continue multivitamin, folic acid and thiamine.  Encourage alcohol cessation.  See individual problem list below for more.   Problems addressed during this  hospitalization Principal Problem:   Diabetic foot ulcer ([place]) Active Problems:   Tobacco abuse   Peripheral vascular disease ([place])   Alcohol abuse   Cellulitis of right lower extremity   Self neglect   Physical deconditioning   Right foot pain/cellulitis/diabetic foot ulcer: Presents with right foot pain.  Has no fever or leukocytosis.  Lactic acidosis likely from alcohol, and resolved.[place] and MRI without abscess or osteomyelitis but concerning for cellulitis.  He has history of PVD but ABI without significant finding.  LDL 69.  Not diabetic.  A1c 5.0%. -Vancomycin 2/24>> IV Ancef 2/25>> p.o. cefadroxil 2/27>>> for 7 more days. -Wound care as below. -Continue PT/OT   Peripheral vascular disease: LE arterial duplex negative for significant PAD.   Alcohol intoxication/withdrawal: Reports drinking 3-4 beers a day.  Alcohol level 161 on admission.  Has some tremors and tachycardia earlier during hospitalization.  Did not require IV Ativan for almost 48 hours -Encouraged alcohol cessation/moderation -Continue folic acid, thiamine and multivitamin  Sinus tachycardia/elevated blood pressure -Coreg 6.25 mg twice daily   Tobacco use disorder: Reports smoking for the 5 cigarettes a day. -Encouraged smoking cessation.   Lactic acidosis: Likely due to alcohol.  Resolved.   Macrocytosis/vitamin B12 deficiency: B12 low at 172.  Likely due to alcohol. -Vitamin B12 injection 1000 mcg followed by oral vitamin B12.  Self-neglect: Lives alone.  Brother concerned about patient drinking and not taking care of himself.  Reportedly not showering and urinating and defecating on himself when he drinks. -TOC consulted   Physical deconditioning -Therapy recommended SNF  Vital signs Vitals:   10/23/22 1456 10/23/22 2121 10/24/22 0919 10/24/22 1010  BP: (!) 158/87 134/75 130/78 134/78  Pulse: (!) 109 (!) 101 94 91  Temp: 98.3 F (36.8 C) 98.4 F (36.9 C) 98.5 F (36.9 C) 98.1 F  (36.7 C)  Resp: '16 16 15 16  '$ Height:      Weight:      SpO2: 100% 98% 98% 100%  TempSrc:    Oral  BMI (Calculated):         Discharge exam  GENERAL: No apparent distress.  Nontoxic. HEENT: MMM.  Vision and hearing grossly intact.  NECK: Supple.  No apparent JVD.  RESP:  No IWOB.  Fair aeration bilaterally. CVS:  RRR. Heart sounds normal.  ABD/GI/GU: BS+. Abd soft, NTND.  MSK/EXT:  Moves extremities.  S/p left great toe and right second toe amputation.  Faint DP pulses. SKIN: Ulceration over dorsal aspect of right foot.  No drainage or surrounding erythema. NEURO: Awake and alert. Oriented appropriately.  No apparent focal neuro deficit. PSYCH: Calm. Normal affect.   Discharge Instructions Discharge Instructions     Diet - low sodium heart healthy   Complete by: As directed    Discharge wound care:   Complete by: As directed    Wound care  Daily      Comments: Wound care to right anterior foot: Cleanse with soap and waster, rinse and dry, particularly between digits. Cover lesion with size appropriate piece of Aquacel Ag+ Advantage Kellie Simmering # [id number]), top with dry gauze 2x2 and secure with silicone foam dressing., Change Aquacel daily.   Increase activity slowly   Complete by: As directed       Allergies as of 10/24/2022   No Known Allergies      Medication List     TAKE these medications    Aquacel Ag Foam 8"X8" Pads Apply to foot wound as directed 1-2 times per day, cover with tape or wrap with ACE wrap   carvedilol 6.25 MG tablet Commonly known as: Coreg Take 1 tablet (6.25 mg total) by mouth 2 (two) times daily.   cefadroxil 500 MG capsule Commonly known as: DURICEF Take 2 capsules (1,000 mg total) by mouth 2 (two) times daily for 7 days.   cyanocobalamin 1000 MCG tablet Commonly known as: VITAMIN B12 Take 1 tablet (1,000 mcg total) by mouth daily.   folic acid 1 MG tablet Commonly known as: FOLVITE Take 1 tablet (1 mg total) by mouth daily.    multivitamin with minerals Tabs tablet Take 1 tablet by mouth daily.   thiamine 100 MG tablet Commonly known as: VITAMIN B1 Take 1 tablet (100 mg total) by mouth daily.               Discharge Care Instructions  (From admission, onward)           Start     Ordered   10/24/22 0000  Discharge wound care:       Comments: Wound care  Daily      Comments: Wound care to right anterior foot: Cleanse with soap and waster, rinse and dry, particularly between digits. Cover lesion with size appropriate piece of Aquacel Ag+ Advantage Kellie Simmering # [phone]), top with dry gauze 2x2 and secure with silicone foam dressing., Change Aquacel daily.   10/24/22 1007            Consultations: None  Procedures/Studies:   US ARTERIAL LOWER EXTREMITY DUPLEX BILATERAL  Result Date: 10/22/2022 CLINICAL DATA:  Diabetic foot ulcer EXAM: BILATERAL LOWER EXTREMITY ARTERIAL DUPLEX SCAN TECHNIQUE: Gray-scale sonography as well as color Doppler and duplex ultrasound was performed to evaluate the arteries of both lower extremities including the common, superficial and profunda femoral arteries, popliteal artery and calf arteries. COMPARISON:  None available FINDINGS: Right Lower Extremity Inflow: Normal common femoral arterial waveforms and velocities. No evidence of inflow (aortoiliac) disease. Outflow: Normal profunda femoral, superficial femoral and popliteal arterial waveforms and velocities. No focal elevation of the PSV to suggest stenosis. Runoff: Normal posterior and anterior tibial arterial waveforms and velocities. Vessels are patent to the ankle. Left Lower Extremity Inflow: Normal common femoral arterial waveforms and velocities. No evidence of inflow (aortoiliac) disease. Outflow: Normal profunda femoral, superficial femoral and popliteal arterial waveforms and velocities. No focal elevation of the PSV to suggest stenosis. Runoff: Normal posterior and anterior tibial arterial waveforms and  velocities. Vessels are patent to the ankle. IMPRESSION: No evidence of significant lower extremity arterial occlusive disease. Electronically Signed   By: Miachel Roux M.D.   On: 10/22/2022 14:33   MR FOOT RIGHT W WO CONTRAST  Result Date: 10/22/2022 CLINICAL DATA:  Foot swelling, diabetic, osteomyelitis suspected, no prior imaging EXAM: MRI OF THE RIGHT FOREFOOT WITHOUT AND WITH CONTRAST TECHNIQUE: Multiplanar, multisequence MR imaging of the right forefoot was performed before and after the administration of intravenous contrast. CONTRAST:  20m GADAVIST GADOBUTROL 1 MMOL/ML IV SOLN COMPARISON:  X-ray 10/21/2022, MRI 10/06/2022 FINDINGS: Technical Note: Despite efforts by the technologist and patient, motion artifact is present on today's exam and could not be eliminated. This reduces exam sensitivity and specificity. Bones/Joint/Cartilage Prior second ray amputation at the level of the second metatarsal neck. Prior amputation of the third metatarsal head and neck and third toe proximal phalanx. Remaining osseous structures are intact without fracture or dislocation. Within the limitations of this motion degraded exam, there is no abnormal bone marrow edema or marrow replacement to suggest osteomyelitis. No sizable joint effusion to suggest septic arthritis. Similar bony alignment with hallux valgus deformity and degenerative changes. Ligaments Grossly intact Lisfranc ligament. Muscles and Tendons Chronic denervation changes of the foot musculature. No tenosynovitis. Soft tissues Dorsal subcutaneous edema with enhancement of the forefoot. No organized or drainable fluid collections. IMPRESSION: 1. Motion degraded exam. 2. Dorsal subcutaneous edema with enhancement of the forefoot suggesting cellulitis. No organized or drainable fluid collections. 3. No evidence of osteomyelitis or septic arthritis. Electronically Signed   By: NDavina PokeD.O.   On: 10/22/2022 09:42   DG Chest Port 1 View  Result  Date: 10/21/2022 CLINICAL DATA:  Weakness. EXAM: PORTABLE CHEST 1 VIEW COMPARISON:  Chest radiographs 10/06/2022, 11/27/2021 FINDINGS: Cardiac silhouette and mediastinal contours are within normal limits. Mild calcification within the aortic arch. Mild left greater right basilar horizontal linear scarring is unchanged. No pleural effusion or pneumothorax. No acute skeletal abnormality. IMPRESSION: 1. No active disease. Mild bibasilar scarring, unchanged from prior. 2. Aortic atherosclerosis. Electronically Signed   By: RYvonne KendallM.D.   On: 10/21/2022 18:22   DG Foot Complete Right  Result Date: 10/21/2022 CLINICAL DATA:  Ulcer.  Evaluate for osteomyelitis. EXAM: RIGHT FOOT COMPLETE - 3+ VIEW COMPARISON:  Right foot radiographs 06/29/2021 FINDINGS: Postsurgical changes are again seen of amputation of the second and third digits to the distal metatarsal shafts. There is again some bone remaining just distal and dorsal to the postsurgical third metatarsal amputation site, likely a portion of the middle phalanx and the distal phalanx of the third  toe. This is unchanged. Mild hallux valgus. High-grade lateral angulation of the distal phalanx of the great toe and toenail, unchanged. Moderate plantar calcaneal heel spur. No radiographic evidence of significant soft tissue ulcer is seen. No new cortical erosion is seen. IMPRESSION: Redemonstration of second and third digit amputations, similar to prior. No new cortical erosion is seen. Electronically Signed   By: Yvonne Kendall M.D.   On: 10/21/2022 18:20   CT Head Wo Contrast  Result Date: 10/21/2022 CLINICAL DATA:  Dizziness. EXAM: CT HEAD WITHOUT CONTRAST TECHNIQUE: Contiguous axial images were obtained from the base of the skull through the vertex without intravenous contrast. RADIATION DOSE REDUCTION: This exam was performed according to the departmental dose-optimization program which includes automated exposure control, adjustment of the mA and/or kV  according to patient size and/or use of iterative reconstruction technique. COMPARISON:  October 06, 2022 FINDINGS: Brain: There is mild cerebral atrophy with widening of the extra-axial spaces and ventricular dilatation. There are areas of decreased attenuation within the white matter tracts of the supratentorial brain, consistent with microvascular disease changes. Vascular: There is marked severity calcification of the bilateral cavernous carotid arteries. Skull: Normal. Negative for fracture or focal lesion. Sinuses/Orbits: Chronic and/or postoperative changes are seen along the medial walls of the bilateral maxillary sinuses. Other: None. IMPRESSION: 1. No acute intracranial abnormality. 2. Generalized cerebral atrophy with chronic white matter small vessel ischemic changes. Electronically Signed   By: Virgina Norfolk M.D.   On: 10/21/2022 17:51   US ARTERIAL ABI (SCREENING LOWER EXTREMITY)  Result Date: 10/07/2022 CLINICAL DATA:  Diabetic foot ulcer. Prior bilateral toe amputation. Current smoker. Diabetic. EXAM: NONINVASIVE PHYSIOLOGIC VASCULAR STUDY OF BILATERAL LOWER EXTREMITIES TECHNIQUE: Evaluation of both lower extremities were performed at rest, including calculation of ankle-brachial indices with single level pressure measurements and doppler recording. COMPARISON:  None available. FINDINGS: Right ABI:  1.03 Left ABI:  1.10 Right Lower Extremity: Monophasic waveform seen in the dorsalis pedis artery. Posterior tibial artery is multiphasic. Left Lower Extremity:  Normal arterial waveforms at the ankle. 1.0-1.4 Normal IMPRESSION: No definitive evidence of significant lower extremity arterial occlusive disease. Electronically Signed   By: Miachel Roux M.D.   On: 10/07/2022 12:01   MR FOOT RIGHT W WO CONTRAST  Result Date: 10/07/2022 CLINICAL DATA:  Prior amputation. Diabetic foot ulcer evaluate for osteomyelitis EXAM: MRI OF THE RIGHT FOREFOOT WITHOUT AND WITH CONTRAST TECHNIQUE: Multiplanar,  multisequence MR imaging of the right foot was performed before and after the administration of intravenous contrast. CONTRAST:  63m GADAVIST GADOBUTROL 1 MMOL/ML IV SOLN COMPARISON:  None Available. FINDINGS: Bones/Joint/Cartilage No acute fracture or dislocation. Prior amputation of the second metatarsal head and phalanx. Prior amputation of the distal third of the third metatarsal and proximal phalanx. Normal alignment. No joint effusion. No marrow signal abnormality. No bone destruction or periosteal reaction. Hallux valgus. Moderate osteoarthritis of the first MTP joint. Ligaments Collateral ligaments are intact.  Lisfranc ligament is intact. Muscles and Tendons Flexor, peroneal and extensor compartment tendons are intact. Diffuse muscle atrophy. Soft tissue No fluid collection or hematoma. No soft tissue mass. Diffuse soft tissue edema along the dorsal aspect of the foot with a soft tissue wound along the dorsal medial midfoot and associated knee enhancement concerning for cellulitis. IMPRESSION: 1. Diffuse soft tissue edema along the dorsal aspect of the foot with a soft tissue wound along the dorsal medial midfoot and associated knee enhancement concerning for cellulitis. No drainable fluid collection to suggest an abscess. 2.  No osteomyelitis of the right foot. Electronically Signed   By: Kathreen Devoid M.D.   On: 10/07/2022 09:07   US ABDOMEN LIMITED RUQ (LIVER/GB)  Result Date: 10/06/2022 CLINICAL DATA:  Abdomen pain abnormal LFT EXAM: ULTRASOUND ABDOMEN LIMITED RIGHT UPPER QUADRANT COMPARISON:  None Available. FINDINGS: Gallbladder: Contracted gallbladder. No shadowing stones. Upper normal wall thickness. Negative sonographic Murphy Common bile duct: Diameter: 3.4 mm Liver: Echogenic liver parenchyma. No focal hepatic abnormality. Portal vein is patent on color Doppler imaging with normal direction of blood flow towards the liver. Other: None. IMPRESSION: Negative for gallstones or biliary dilatation.  Echogenic liver parenchyma consistent with hepatic steatosis. Electronically Signed   By: Donavan Foil M.D.   On: 10/06/2022 20:53   CT Head Wo Contrast  Result Date: 10/06/2022 CLINICAL DATA:  Delirium, memory loss EXAM: CT HEAD WITHOUT CONTRAST TECHNIQUE: Contiguous axial images were obtained from the base of the skull through the vertex without intravenous contrast. RADIATION DOSE REDUCTION: This exam was performed according to the departmental dose-optimization program which includes automated exposure control, adjustment of the mA and/or kV according to patient size and/or use of iterative reconstruction technique. COMPARISON:  11/28/2021 FINDINGS: Brain: No evidence of acute infarction, hemorrhage, extra-axial collection, ventriculomegaly, or mass effect. Tiny old left basal ganglia lacunar infarct. Generalized cerebral atrophy. Periventricular white matter low attenuation likely secondary to microangiopathy. Vascular: Cerebrovascular atherosclerotic calcifications are noted. Skull: Negative for fracture or focal lesion. Sinuses/Orbits: Visualized portions of the orbits are unremarkable. Visualized portions of the paranasal sinuses are unremarkable. Visualized portions of the mastoid air cells are unremarkable. Other: None. IMPRESSION: 1. No acute intracranial findings. 2. Chronic small vessel ischemic changes. Electronically Signed   By: Kathreen Devoid M.D.   On: 10/06/2022 18:21   DG Chest Port 1 View  Result Date: 10/06/2022 CLINICAL DATA:  Weakness. EXAM: PORTABLE CHEST 1 VIEW COMPARISON:  11/27/2021 chest radiograph FINDINGS: The cardiomediastinal silhouette is unremarkable. There is no evidence of focal airspace disease, pulmonary edema, suspicious pulmonary nodule/mass, pleural effusion, or pneumothorax. No acute bony abnormalities are identified. IMPRESSION: No active disease. Electronically Signed   By: Margarette Canada M.D.   On: 10/06/2022 17:54       The results of significant diagnostics from  this hospitalization (including imaging, microbiology, ancillary and laboratory) are listed below for reference.     Microbiology: Recent Results (from the past 240 hour(s))  Resp panel by RT-PCR (RSV, Flu A&B, Covid) Anterior Nasal Swab     Status: None   Collection Time: 10/21/22  6:10 PM   Specimen: Anterior Nasal Swab  Result Value Ref Range Status   SARS Coronavirus 2 by RT PCR NEGATIVE NEGATIVE Final    Comment: (NOTE) SARS-CoV-2 target nucleic acids are NOT DETECTED.  The SARS-CoV-2 RNA is generally detectable in upper respiratory specimens during the acute phase of infection. The lowest concentration of SARS-CoV-2 viral copies this assay can detect is 138 copies/mL. A negative result does not preclude SARS-Cov-2 infection and should not be used as the sole basis for treatment or other patient management decisions. A negative result may occur with  improper specimen collection/handling, submission of specimen other than nasopharyngeal swab, presence of viral mutation(s) within the areas targeted by this assay, and inadequate number of viral copies(<138 copies/mL). A negative result must be combined with clinical observations, patient history, and epidemiological information. The expected result is Negative.  Fact Sheet for Patients:  EntrepreneurPulse.com.au  Fact Sheet for Healthcare Providers:  IncredibleEmployment.be  This test is no  t yet approved or cleared by the [place] and  has been authorized for detection and/or diagnosis of SARS-CoV-2 by FDA under an Emergency Use Authorization (EUA). This EUA will remain  in effect (meaning this test can be used) for the duration of the COVID-19 declaration under Section 564(b)(1) of the Act, 21 U.S.C.section 360bbb-3(b)(1), unless the authorization is terminated  or revoked sooner.       Influenza A by PCR NEGATIVE NEGATIVE Final   Influenza B by PCR NEGATIVE NEGATIVE Final     Comment: (NOTE) The Xpert Xpress SARS-CoV-2/FLU/RSV plus assay is intended as an aid in the diagnosis of influenza from Nasopharyngeal swab specimens and should not be used as a sole basis for treatment. Nasal washings and aspirates are unacceptable for Xpert Xpress SARS-CoV-2/FLU/RSV testing.  Fact Sheet for Patients: EntrepreneurPulse.com.au  Fact Sheet for Healthcare Providers: IncredibleEmployment.be  This test is not yet approved or cleared by the [place] FDA and has been authorized for detection and/or diagnosis of SARS-CoV-2 by FDA under an Emergency Use Authorization (EUA). This EUA will remain in effect (meaning this test can be used) for the duration of the COVID-19 declaration under Section 564(b)(1) of the Act, 21 U.S.C. section 360bbb-3(b)(1), unless the authorization is terminated or revoked.     Resp Syncytial Virus by PCR NEGATIVE NEGATIVE Final    Comment: (NOTE) Fact Sheet for Patients: EntrepreneurPulse.com.au  Fact Sheet for Healthcare Providers: IncredibleEmployment.be  This test is not yet approved or cleared by the [place] FDA and has been authorized for detection and/or diagnosis of SARS-CoV-2 by FDA under an Emergency Use Authorization (EUA). This EUA will remain in effect (meaning this test can be used) for the duration of the COVID-19 declaration under Section 564(b)(1) of the Act, 21 U.S.C. section 360bbb-3(b)(1), unless the authorization is terminated or revoked.  Performed at [facility], [address] [place], [place] [address]      Labs:  CBC: Recent Labs  Lab 10/21/22 1700 10/21/22 2157 10/22/22 0427 10/23/22 0511  WBC 3.6* 4.5 4.4 4.4  HGB 13.3 11.9* 11.5* 11.6*  HCT 41.0 34.8* 33.8* 34.9*  MCV 109.0* 107.7* 107.3* 107.4*  PLT 310 279 266 246   BMP &GFR Recent Labs  Lab 10/21/22 1700 10/21/22 2157 10/22/22 0427 10/23/22 0511   NA 136  --  135 137  K 3.6  --  3.6 3.7  CL 103  --  104 105  CO2 23  --  22 24  GLUCOSE 78  --  110* 104*  BUN <5*  --  <5* 5*  CREATININE 0.56* 0.61 0.64 0.69  CALCIUM 8.8*  --  8.6* 8.8*  MG 1.9  --   --  2.1  PHOS  --   --   --  4.2   Estimated Creatinine Clearance: 124 mL/min (by C-G formula based on SCr of 0.69 mg/dL). Liver & Pancreas: Recent Labs  Lab 10/21/22 1700 10/22/22 0427 10/23/22 0511  AST 41 31  --   ALT 24 21  --   ALKPHOS 77 72  --   BILITOT 0.6 1.0  --   PROT 6.4* 5.8*  --   ALBUMIN 3.1* 2.9* 3.1*   Recent Labs  Lab 10/21/22 1700  LIPASE 41   No results for input(s): "AMMONIA" in the last 168 hours. Diabetic: Recent Labs    10/21/22 2157  HGBA1C 5.1   No results for input(s): "GLUCAP" in the last 168 hours. Cardiac Enzymes: No results for input(s): "  CKTOTAL", "CKMB", "CKMBINDEX", "TROPONINI" in the last 168 hours. No results for input(s): "PROBNP" in the last 8760 hours. Coagulation Profile: No results for input(s): "INR", "PROTIME" in the last 168 hours. Thyroid Function Tests: No results for input(s): "TSH", "T4TOTAL", "FREET4", "T3FREE", "THYROIDAB" in the last 72 hours. Lipid Profile: Recent Labs    10/22/22 0718  CHOL 180  HDL 98  LDLCALC 69  TRIG 65  CHOLHDL 1.8   Anemia Panel: Recent Labs    10/23/22 0511  VITAMINB12 175*  FOLATE 9.5  FERRITIN 172  TIBC 259  IRON 57  RETICCTPCT 3.0   Urine analysis:    Component Value Date/Time   COLORURINE YELLOW 10/21/2022 1551   APPEARANCEUR CLEAR 10/21/2022 1551   LABSPEC 1.003 (L) 10/21/2022 1551   PHURINE 5.0 10/21/2022 1551   GLUCOSEU NEGATIVE 10/21/2022 1551   HGBUR NEGATIVE 10/21/2022 1551   BILIRUBINUR NEGATIVE 10/21/2022 1551   KETONESUR NEGATIVE 10/21/2022 1551   PROTEINUR NEGATIVE 10/21/2022 1551   NITRITE NEGATIVE 10/21/2022 1551   LEUKOCYTESUR NEGATIVE 10/21/2022 1551   Sepsis Labs: Invalid input(s): "PROCALCITONIN", "LACTICIDVEN"   SIGNED:  Mercy Riding, MD  [facility] 10/24/2022, 10:17 AM

## 2022-10-24 NOTE — Plan of Care (Signed)
Patient discharged per MD orders at this time.All dc instructions, education and medications reviewed with the patient.Pt expressed understanding and will comply with dc instructions.f/u appointments was also communicated to the patient.no verbal c/o or any ssx of distress.Pt was discharged to the [facility] for STR per order.report was called to staff nurse Ms.Debra before transport.Pt was transported by brother in a privately owned vehicle.

## 2022-10-24 NOTE — TOC Progression Note (Signed)
Transition of Care [facility]) - Progression Note    Patient Details  Name: [name] [name] MRN: [id number] Date of Birth: 08-Feb-1961  Transition of Care [facility]) CM/SW [place], RN Phone Number: 10/24/2022, 9:19 AM  Clinical Narrative:     Met with the patient to review the bed offers, he chose [facility] in [place], He stated that his car is here and he will need his brother to come get it, with his permission I called his brother [name] [name] and he stated that the car has actually been impounded, he will transport the patient o [place] to [facility] for STR, he will go to room E8, he requested that the patient have a bath first   Expected Discharge Plan: [place] Barriers to Discharge: SNF Pending bed offer  Expected Discharge Plan and Services   Discharge Planning Services: CM Consult   Living arrangements for the past 2 months: Single Family Home Expected Discharge Date: 10/24/22                                     Social Determinants of Health (SDOH) Interventions SDOH Screenings   Food Insecurity: No Food Insecurity (10/21/2022)  Recent Concern: Food Insecurity - Food Insecurity Present (10/07/2022)  Housing: Low Risk  (10/21/2022)  Transportation Needs: No Transportation Needs (10/21/2022)  Utilities: Not At Risk (10/21/2022)  Tobacco Use: High Risk (10/21/2022)    Readmission Risk Interventions     No data to display

## 2022-10-24 NOTE — Progress Notes (Signed)
   10/24/22 0900  Mobility  Activity Ambulated with assistance in hallway;Stood at bedside;Transferred from chair to bed;Dangled on edge of bed  Level of Assistance Standby assist, set-up cues, supervision of patient - no hands on  Assistive Device Front wheel walker  Distance Ambulated (ft) 140 ft  RUE Weight Bearing WBAT  Activity Response Tolerated well  Mobility Referral Yes  $Mobility charge 1 Mobility   Pt resting in bed on RA upon entry. Pt STS and ambulates to hallway around NS with AD. Pt returned to recliner and left with needs in reach. Nursing student present in room at [place].   Loma Sender Mobility Specialist 10/24/22, 9:09 AM

## 2022-10-24 NOTE — Care Management Important Message (Signed)
Important Message  Patient Details  Name: [name] [name] MRN: [id number] Date of Birth: 03-May-1961   Medicare Important Message Given:  Yes  Obtained signature on initial IM and will send to Health Information Management to be scanned into his medical record.  Juliann Pulse A Shalandra Leu 10/24/2022, 10:50 AM

## 2022-10-24 NOTE — Plan of Care (Signed)
  Problem: Education: Goal: Knowledge of General Education information will improve Description: Including pain rating scale, medication(s)/side effects and non-pharmacologic comfort measures Outcome: Progressing   Problem: Health Behavior/Discharge Planning: Goal: Ability to manage health-related needs will improve Outcome: Progressing   Problem: Clinical Measurements: Goal: Ability to maintain clinical measurements within normal limits will improve Outcome: Progressing Goal: Diagnostic test results will improve Outcome: Progressing Goal: Cardiovascular complication will be avoided Outcome: Progressing   Problem: Activity: Goal: Risk for activity intolerance will decrease Outcome: Progressing   Problem: Nutrition: Goal: Adequate nutrition will be maintained Outcome: Progressing   Problem: Elimination: Goal: Will not experience complications related to bowel motility Outcome: Progressing Goal: Will not experience complications related to urinary retention Outcome: Progressing   Problem: Pain Managment: Goal: General experience of comfort will improve Outcome: Progressing   Problem: Safety: Goal: Ability to remain free from injury will improve Outcome: Progressing   

## 2022-10-26 ENCOUNTER — Other Ambulatory Visit
Admission: RE | Admit: 2022-10-26 | Discharge: 2022-10-26 | Disposition: A | Discharge status: Home / Self Care | Payer: No Typology Code available for payment source | Source: Ambulatory Visit | Attending: Physician Assistant | Admitting: Physician Assistant

## 2022-10-26 ENCOUNTER — Encounter: Payer: Medicare Other | Attending: Physician Assistant | Admitting: Physician Assistant

## 2022-10-26 ENCOUNTER — Other Ambulatory Visit: Payer: Self-pay | Admitting: Physician Assistant

## 2022-10-26 DIAGNOSIS — L089 Local infection of the skin and subcutaneous tissue, unspecified: Secondary | ICD-10-CM | POA: Diagnosis present

## 2022-10-26 DIAGNOSIS — M14672 Charcot's joint, left ankle and foot: Secondary | ICD-10-CM | POA: Diagnosis not present

## 2022-10-26 DIAGNOSIS — L97524 Non-pressure chronic ulcer of other part of left foot with necrosis of bone: Secondary | ICD-10-CM | POA: Diagnosis not present

## 2022-10-26 DIAGNOSIS — I87323 Chronic venous hypertension (idiopathic) with inflammation of bilateral lower extremity: Secondary | ICD-10-CM | POA: Diagnosis not present

## 2022-10-26 DIAGNOSIS — L97512 Non-pressure chronic ulcer of other part of right foot with fat layer exposed: Secondary | ICD-10-CM | POA: Insufficient documentation

## 2022-10-26 DIAGNOSIS — F17218 Nicotine dependence, cigarettes, with other nicotine-induced disorders: Secondary | ICD-10-CM | POA: Insufficient documentation

## 2022-10-26 DIAGNOSIS — E11628 Type 2 diabetes mellitus with other skin complications: Secondary | ICD-10-CM | POA: Diagnosis present

## 2022-10-26 DIAGNOSIS — Z89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: Secondary | ICD-10-CM | POA: Diagnosis not present

## 2022-10-26 DIAGNOSIS — E11621 Type 2 diabetes mellitus with foot ulcer: Secondary | ICD-10-CM | POA: Diagnosis not present

## 2022-10-30 LAB — AEROBIC CULTURE W GRAM STAIN (SUPERFICIAL SPECIMEN)

## 2022-10-30 LAB — SURGICAL PATHOLOGY

## 2022-10-30 NOTE — Progress Notes (Signed)
FINNEUS, RASOR ([id number]) [id number] Page 1 of 11 Visit Report for 10/26/2022 Allergy List Details Patient Name: Date of Service: [name] [name] [name], [name] [name] [name] 10/26/2022 10:30 A M Medical Record Number: [id number] Patient Account Number: [id number] Date of Birth/Sex: Treating RN: November 16, 1960 (62 y.o. Male) Rosalio Loud Primary Care Yailen Zemaitis: SYSTEM, PCP Other Clinician: Referring Stewart Sasaki: Treating Machelle Raybon/Extender: Kerin Perna Weeks in Treatment: 0 Allergies Active Allergies No Known Allergies Allergy Notes Electronic Signature(s) Signed: 10/27/2022 2:53:23 PM By: Rosalio Loud MSN RN CNS WTA Entered By: Rosalio Loud on 10/26/2022 11:33:41 -------------------------------------------------------------------------------- Arrival Information Details Patient Name: Date of Service: [name] [name] [name] RNEY [name] [name] 10/26/2022 10:30 A M Medical Record Number: [id number] Patient Account Number: [id number] Date of Birth/Sex: Treating RN: 1960-11-03 (62 y.o. Male) Rosalio Loud Primary Care Fayette Gasner: SYSTEM, PCP Other Clinician: Referring Maurica Omura: Treating Irys Nigh/Extender: Audry Pili in Treatment: 0 Visit Information Patient Arrived: Wheel Chair Arrival Time: 10:16 Accompanied By: self Transfer Assistance: None Patient Identification Verified: Yes Secondary Verification Process Completed: Yes Patient Requires Transmission-Based Precautions: No Patient Has Alerts: Yes Patient Alerts: Diabetes Type 2 R ABI 1.03 [name] [name] Electronic Signature(s) Signed: 10/27/2022 2:53:23 PM By: Rosalio Loud MSN RN CNS [name], [name] ([id number]) [id number] Page 2 of 11 Entered By: Rosalio Loud on 10/26/2022 11:13:03 -------------------------------------------------------------------------------- Clinic Level of Care Assessment Details Patient Name: Date of Service: [name] [name] [name], [name] [name] [name] 10/26/2022 10:30 A M Medical Record Number:  [id number] Patient Account Number: [id number] Date of Birth/Sex: Treating RN: 1960/11/26 (62 y.o. Male) Rosalio Loud Primary Care Tahjir Silveria: SYSTEM, PCP Other Clinician: Referring Jerry Haugen: Treating Ravan Schlemmer/Extender: Audry Pili in Treatment: 0 Clinic Level of Care Assessment Items TOOL 1 Quantity Score '[]'$  - 0 Use when EandM and Procedure is performed on INITIAL visit ASSESSMENTS - Nursing Assessment / Reassessment '[]'$  - 0 General Physical Exam (combine w/ comprehensive assessment (listed just below) when performed on new pt. evals) '[]'$  - 0 Comprehensive Assessment (HX, ROS, Risk Assessments, Wounds Hx, etc.) ASSESSMENTS - Wound and Skin Assessment / Reassessment '[]'$  - 0 Dermatologic / Skin Assessment (not related to wound area) ASSESSMENTS - Ostomy and/or Continence Assessment and Care '[]'$  - 0 Incontinence Assessment and Management '[]'$  - 0 Ostomy Care Assessment and Management (repouching, etc.) PROCESS - Coordination of Care '[]'$  - 0 Simple Patient / Family Education for ongoing care '[]'$  - 0 Complex (extensive) Patient / Family Education for ongoing care '[]'$  - 0 Staff obtains Programmer, systems, Records, T Results / Process Orders est '[]'$  - 0 Staff telephones HHA, Nursing Homes / Clarify orders / etc '[]'$  - 0 Routine Transfer to another Facility (non-emergent condition) '[]'$  - 0 Routine Hospital Admission (non-emergent condition) '[]'$  - 0 New Admissions / Biomedical engineer / Ordering NPWT Apligraf, etc. , '[]'$  - 0 Emergency Hospital Admission (emergent condition) PROCESS - Special Needs '[]'$  - 0 Pediatric / Minor Patient Management '[]'$  - 0 Isolation Patient Management '[]'$  - 0 Hearing / Language / Visual special needs '[]'$  - 0 Assessment of Community assistance (transportation, D/C planning, etc.) '[]'$  - 0 Additional assistance / Altered mentation '[]'$  - 0 Support Surface(s) Assessment (bed, cushion, seat, etc.) INTERVENTIONS - Miscellaneous '[]'$  - 0 External ear  exam '[]'$  - 0 Patient Transfer (multiple staff / Civil Service fast streamer / Similar devices) '[]'$  - 0 Simple Staple / Suture removal (25 or less) '[]'$  - 0 Complex Staple / Suture removal (26 or more) [name] [name] [name], [name] [name] [name] ([id number]) [id number] Page 3 of 11 '[]'$  - 0 Hypo/Hyperglycemic Management (do not check if  billed separately) '[]'$  - 0 Ankle / Brachial Index (ABI) - do not check if billed separately Has the patient been seen at the hospital within the last three years: Yes Total Score: 0 Level Of Care: ____ Electronic Signature(s) Signed: 10/27/2022 2:53:23 PM By: Rosalio Loud MSN RN CNS WTA Entered By: Rosalio Loud on 10/26/2022 11:40:00 -------------------------------------------------------------------------------- Encounter Discharge Information Details Patient Name: Date of Service: [name] [name] [name] RNEY [name] [name] 10/26/2022 10:30 A M Medical Record Number: [id number] Patient Account Number: [id number] Date of Birth/Sex: Treating RN: Jul 23, 1961 (62 y.o. Male) Rosalio Loud Primary Care Teniqua Marron: SYSTEM, PCP Other Clinician: Referring Samon Dishner: Treating Darcel Frane/Extender: Audry Pili in Treatment: 0 Encounter Discharge Information Items Post Procedure Vitals Discharge Condition: Stable Temperature (F): 98.1 Ambulatory Status: Wheelchair Pulse (bpm): 90 Discharge Destination: Home Respiratory Rate (breaths/min): 16 Transportation: Other Blood Pressure (mmHg): 132/85 Accompanied By: self Schedule Follow-up Appointment: Yes Clinical Summary of Care: Electronic Signature(s) Signed: 10/27/2022 2:53:23 PM By: Rosalio Loud MSN RN CNS WTA Entered By: Rosalio Loud on 10/26/2022 11:44:51 -------------------------------------------------------------------------------- Lower Extremity Assessment Details Patient Name: Date of Service: [name] [name] [name] 10/26/2022 10:30 A M Medical Record Number: [id number] Patient Account Number: [id number] Date of Birth/Sex: Treating  RN: [birth year] (62 y.o. Male) Rosalio Loud Primary Care Keshonda Monsour: SYSTEM, PCP Other Clinician: Referring Morgon Pamer: Treating Tenia Goh/Extender: Kerin Perna Weeks in Treatment: 0 Edema Assessment Assessed: [Left: No] [Right: No] [Left: Edema] [Right: :] W[LeftTobie Lords [id number] [Right: [id number] Page 4 of 11] Calf Left: Right: Point of Measurement: 38 cm From Medial Instep 37.3 cm 35.7 cm Ankle Left: Right: Point of Measurement: 15 cm From Medial Instep 24 cm 23 cm Knee To Floor Left: Right: From Medial Instep 49 cm Vascular Assessment Pulses: Dorsalis Pedis Palpable: [Left:No] [Right:No] Doppler Audible: [Left:Yes] [Right:Yes] Posterior Tibial Palpable: [Left:No] [Right:No] Doppler Audible: [Left:Yes] [Right:Yes] Popliteal Palpable: [Left:No Yes] [Right:No Yes] Electronic Signature(s) Signed: 10/27/2022 2:53:23 PM By: Rosalio Loud MSN RN CNS WTA Entered By: Rosalio Loud on 10/26/2022 11:33:38 -------------------------------------------------------------------------------- Multi Wound Chart Details Patient Name: Date of Service: [name] [name] [name] RNEY [name] [name] 10/26/2022 10:30 A M Medical Record Number: [id number] Patient Account Number: [id number] Date of Birth/Sex: Treating RN: 07/26/1961 (62 y.o. Male) Rosalio Loud Primary Care Doyel Mulkern: SYSTEM, PCP Other Clinician: Referring Otie Headlee: Treating Welles Walthall/Extender: Audry Pili in Treatment: 0 Vital Signs Height(in): 76 Pulse(bpm): 90 Weight(lbs): 232 Blood Pressure(mmHg): 132/85 Body Mass Index(BMI): 28.2 Temperature(F): 98.1 Respiratory Rate(breaths/min): 16 [1:Photos:] [N/A:N/A [id number] Page 5 of 11] Left, Anterior T Second oe Right, Dorsal Foot N/A Wound Location: Pressure Injury Footwear Injury N/A Wounding Event: Diabetic Wound/Ulcer of the Lower Diabetic Wound/Ulcer of the Lower N/A Primary  Etiology: Extremity Extremity Peripheral Venous Disease, Type II Peripheral Venous Disease, Type II N/A Comorbid History: Diabetes, Osteomyelitis Diabetes, Osteomyelitis 10/12/2022 10/12/2022 N/A Date Acquired: 0 0 N/A Weeks of Treatment: Open Open N/A Wound Status: No No N/A Wound Recurrence: Yes No N/A Pending A mputation on Presentation: 1.1x1.1x0.5 1.8x1.7x0.1 N/A Measurements [name] [name] x W x D (cm) 0.95 2.403 N/A A (cm) : rea 0.475 0.24 N/A Volume (cm) : 12 Starting Position 1 (o'clock): 10 Ending Position 1 (o'clock): 0.3 Maximum Distance 1 (cm): Yes No N/A Undermining: Grade 2 Grade 1 N/A Classification: Medium Medium N/A Exudate A mount: Serosanguineous Serosanguineous N/A Exudate Type: red, brown red, brown N/A Exudate Color: Medium (34-66%) Small (1-33%) N/A Granulation A mount: Red, Pink Red, Pink N/A Granulation Quality: Small (1-33%) Medium (34-66%) N/A Necrotic A mount: Joint: Yes Fat Layer (Subcutaneous  Tissue): Yes N/A Exposed Structures: Bone: Yes Fascia: No Fascia: No Tendon: No Fat Layer (Subcutaneous Tissue): No Muscle: No Tendon: No Joint: No Muscle: No Bone: No N/A Small (1-33%) N/A Epithelialization: Debridement - Excisional Debridement - Excisional N/A Debridement: Bone, Subcutaneous, Slough Subcutaneous, Slough N/A Tissue Debrided: Skin/Subcutaneous Skin/Subcutaneous Tissue N/A Level: Tissue/Muscle/Bone 0.44 3.06 N/A Debridement A (sq cm): rea Curette Curette N/A Instrument: Moderate Minimum N/A Bleeding: Pressure Pressure N/A Hemostasis A chieved: Debridement Treatment Response: Procedure was tolerated well Procedure was tolerated well N/A Post Debridement Measurements [name] [name] x 0.4x1.1x0.6 1.8x1.7x0.2 N/A W x D (cm) 0.207 0.481 N/A Post Debridement Volume: (cm) Debridement Debridement N/A Procedures Performed: Treatment Notes Electronic Signature(s) Signed: 10/27/2022 2:53:23 PM By: Rosalio Loud MSN RN CNS WTA Entered  By: Rosalio Loud on 10/26/2022 11:35:44 -------------------------------------------------------------------------------- Multi-Disciplinary Care Plan Details Patient Name: Date of Service: [name] [name] [name] RNEY [name] [name] 10/26/2022 10:30 A M Medical Record Number: [id number] Patient Account Number: [id number] Date of Birth/Sex: Treating RN: 07-21-1961 (62 y.o. Male) Rosalio Loud Primary Care Kwamane Whack: SYSTEM, PCP Other Clinician: Edwyna [name] [name] ([id number]) [id number] Page 6 of 11 Referring Peola Joynt: Treating Mallisa Alameda/Extender: Audry Pili in Treatment: 0 Active Inactive Necrotic Tissue Nursing Diagnoses: Impaired tissue integrity related to necrotic/devitalized tissue Knowledge deficit related to management of necrotic/devitalized tissue Goals: Necrotic/devitalized tissue will be minimized in the wound bed Date Initiated: 10/26/2022 Target Resolution Date: 11/24/2022 Goal Status: Active Patient/caregiver will verbalize understanding of reason and process for debridement of necrotic tissue Date Initiated: 10/26/2022 Target Resolution Date: 11/24/2022 Goal Status: Active Interventions: Assess patient pain level pre-, during and post procedure and prior to discharge Provide education on necrotic tissue and debridement process Treatment Activities: Excisional debridement : 10/26/2022 T ordered outside of clinic : 10/26/2022 est Notes: Orientation to the Wound Care Program Nursing Diagnoses: Knowledge deficit related to the wound healing center program Goals: Patient/caregiver will verbalize understanding of the [place] Date Initiated: 10/26/2022 Target Resolution Date: 11/04/2022 Goal Status: Active Interventions: Provide education on orientation to the wound center Notes: Wound/Skin Impairment Nursing Diagnoses: Impaired tissue integrity Knowledge deficit related to smoking impact on wound healing Knowledge deficit  related to ulceration/compromised skin integrity Goals: Patient will demonstrate a reduced rate of smoking or cessation of smoking Date Initiated: 10/26/2022 Target Resolution Date: 11/24/2022 Goal Status: Active Patient will have a decrease in wound volume by X% from date: (specify in notes) Date Initiated: 10/26/2022 Target Resolution Date: 11/24/2022 Goal Status: Active Patient/caregiver will verbalize understanding of skin care regimen Date Initiated: 10/26/2022 Target Resolution Date: 11/24/2022 Goal Status: Active Ulcer/skin breakdown will have a volume reduction of 30% by week 4 Date Initiated: 10/26/2022 Target Resolution Date: 11/24/2022 Goal Status: Active Ulcer/skin breakdown will have a volume reduction of 50% by week 8 Date Initiated: 10/26/2022 Target Resolution Date: 12/25/2022 Goal Status: Active Ulcer/skin breakdown will have a volume reduction of 80% by week 12 Date Initiated: 10/26/2022 Target Resolution Date: 01/24/2023 Goal Status: Active Ulcer/skin breakdown will heal within 14 weeks Date Initiated: 10/26/2022 Target Resolution Date: 02/07/2023 Goal Status: Active Interventions: CORNELLIUS, LIMPERT ([id number]) [id number] Page 7 of 11 Assess patient/caregiver ability to obtain necessary supplies Assess patient/caregiver ability to perform ulcer/skin care regimen upon admission and as needed Assess ulceration(s) every visit Provide education on smoking Provide education on ulcer and skin care Treatment Activities: Skin care regimen initiated : 10/26/2022 Smoking cessation education : 10/26/2022 Notes: Electronic Signature(s) Signed: 10/27/2022 2:53:23 PM By: Rosalio Loud MSN RN CNS WTA Entered By: Rosalio Loud on  10/26/2022 11:43:20 -------------------------------------------------------------------------------- Pain Assessment Details Patient Name: Date of Service: [name] [name] [name], [name] [name] [name] 10/26/2022 10:30 A M Medical Record Number: [id number] Patient  Account Number: [id number] Date of Birth/Sex: Treating RN: 07-22-1961 (62 y.o. Male) Rosalio Loud Primary Care Helton Oleson: SYSTEM, PCP Other Clinician: Referring Mariabella Nilsen: Treating Jaremy Nosal/Extender: Kerin Perna Weeks in Treatment: 0 Active Problems Location of Pain Severity and Description of Pain Patient Has Paino No Site Locations Pain Management and Medication Current Pain Management: Electronic Signature(s) Signed: 10/27/2022 2:53:23 PM By: Rosalio Loud MSN RN CNS WTA Entered By: Rosalio Loud on 10/26/2022 10:21:39 [name] [name] [name] ([id number]) [id number] Page 8 of 11 -------------------------------------------------------------------------------- Patient/Caregiver Education Details Patient Name: Date of Service: [name] [name] [name], [name] [name] [name] 2/29/2024andnbsp10:30 [place] Record Number: [id number] Patient Account Number: [id number] Date of Birth/Gender: Treating RN: [birth year] (62 y.o. Male) Rosalio Loud Primary Care Physician: SYSTEM, PCP Other Clinician: Referring Physician: Treating Physician/Extender: Audry Pili in Treatment: 0 Education Assessment Education Provided To: Patient Education Topics Provided Wound Debridement: Handouts: Wound Debridement Methods: Explain/Verbal Responses: State content correctly Wound/Skin Impairment: Handouts: Caring for Your Ulcer Methods: Explain/Verbal Responses: State content correctly Electronic Signature(s) Signed: 10/27/2022 2:53:23 PM By: Rosalio Loud MSN RN CNS WTA Entered By: Rosalio Loud on 10/26/2022 11:43:36 -------------------------------------------------------------------------------- Wound Assessment Details Patient Name: Date of Service: [name] [name] [name], [place] [name] [name] 10/26/2022 10:30 A M Medical Record Number: [id number] Patient Account Number: [id number] Date of Birth/Sex: Treating RN: 10-29-1960 (62 y.o. Male) Rosalio Loud Primary Care Negan Grudzien: SYSTEM, PCP Other  Clinician: Referring Ailea Rhatigan: Treating Anshu Wehner/Extender: Kerin Perna Weeks in Treatment: 0 Wound Status Wound Number: 1 Primary Etiology: Diabetic Wound/Ulcer of the Lower Extremity Wound Location: Left, Anterior T Second oe Wound Status: Open Wounding Event: Pressure Injury Comorbid Peripheral Venous Disease, Type II Diabetes, History: Osteomyelitis Date Acquired: 10/12/2022 Weeks Of Treatment: 0 Clustered Wound: No Pending Amputation On Presentation [name] [name] [name], [name] [name] [name] ([id number]) [id number] Page 9 of 11 Photos Wound Measurements Length: (cm) 1.1 Width: (cm) 1.1 Depth: (cm) 0.5 Area: (cm) 0.95 Volume: (cm) 0.475 % Reduction in Area: % Reduction in Volume: Tunneling: No Undermining: Yes Starting Position (o'clock): 12 Ending Position (o'clock): 10 Maximum Distance: (cm) 0.3 Wound Description Classification: Grade 2 Exudate Amount: Medium Exudate Type: Serosanguineous Exudate Color: red, brown Foul Odor After Cleansing: No Slough/Fibrino Yes Wound Bed Granulation Amount: Medium (34-66%) Exposed Structure Granulation Quality: Red, Pink Fascia Exposed: No Necrotic Amount: Small (1-33%) Fat Layer (Subcutaneous Tissue) Exposed: No Necrotic Quality: Adherent Slough Tendon Exposed: No Muscle Exposed: No Joint Exposed: Yes Bone Exposed: Yes Treatment Notes Wound #1 (Toe Second) Wound Laterality: Left, Anterior Cleanser Soap and Water Discharge Instruction: Gently cleanse wound with antibacterial soap, rinse and pat dry prior to dressing wounds Peri-Wound Care Topical Primary Dressing Prisma 4.34 (in) Discharge Instruction: Moisten w/normal saline or sterile water; Cover wound as directed. Do not remove from wound bed. Secondary Dressing Gauze Discharge Instruction: As directed: dry, moistened with saline or moistened with Dakins Solution Secured With Cameron H Soft Cloth Surgical T ape ape, 2x2  (in/yd) Compression Wrap Compression Stockings Add-Ons Electronic Signature(s) Signed: 10/27/2022 2:53:23 PM By: Rosalio Loud MSN RN CNS WTA Entered By: Rosalio Loud on 10/26/2022 11:33:28 [name] [name] [name] ([id number]) [id number] Page 10 of 11 -------------------------------------------------------------------------------- Wound Assessment Details Patient Name: Date of Service: [name] [name] [name], [name] [name] [name] 10/26/2022 10:30 A M Medical Record Number: [id number] Patient Account Number: [id number] Date of Birth/Sex: Treating RN: 1961/03/26 (62 y.o. Male) Rosalio Loud Primary Care Bethaney Oshana: SYSTEM,  PCP Other Clinician: Referring Christyl Osentoski: Treating Corie Vavra/Extender: Audry Pili in Treatment: 0 Wound Status Wound Number: 2 Primary Etiology: Diabetic Wound/Ulcer of the Lower Extremity Wound Location: Right, Dorsal Foot Wound Status: Open Wounding Event: Footwear Injury Comorbid Peripheral Venous Disease, Type II Diabetes, History: Osteomyelitis Date Acquired: 10/12/2022 Weeks Of Treatment: 0 Clustered Wound: No Photos Wound Measurements Length: (cm) 1.8 Width: (cm) 1.7 Depth: (cm) 0.1 Area: (cm) 2.403 Volume: (cm) 0.24 % Reduction in Area: % Reduction in Volume: Epithelialization: Small (1-33%) Tunneling: No Undermining: No Wound Description Classification: Grade 1 Exudate Amount: Medium Exudate Type: Serosanguineous Exudate Color: red, brown Foul Odor After Cleansing: No Slough/Fibrino Yes Wound Bed Granulation Amount: Small (1-33%) Exposed Structure Granulation Quality: Red, Pink Fascia Exposed: No Necrotic Amount: Medium (34-66%) Fat Layer (Subcutaneous Tissue) Exposed: Yes Necrotic Quality: Adherent Slough Tendon Exposed: No Muscle Exposed: No Joint Exposed: No Bone Exposed: No Treatment Notes Wound #2 (Foot) Wound Laterality: Dorsal, Right Cleanser Soap and Water Discharge Instruction: Gently cleanse wound with  antibacterial soap, rinse and pat dry prior to dressing wounds Peri-Wound Care [name] [name] [name], [name] [name] [name] ([id number]) [id number] Page 11 of 11 Topical Primary Dressing Prisma 4.34 (in) Discharge Instruction: Moisten w/normal saline or sterile water; Cover wound as directed. Do not remove from wound bed. Secondary Dressing Gauze Discharge Instruction: As directed: dry, moistened with saline or moistened with Dakins Solution Secured With [place] H Soft Cloth Surgical T ape ape, 2x2 (in/yd) Compression Wrap Compression Stockings Add-Ons Electronic Signature(s) Signed: 10/27/2022 2:53:23 PM By: Rosalio Loud MSN RN CNS WTA Entered By: Rosalio Loud on 10/26/2022 11:15:00 -------------------------------------------------------------------------------- Vitals Details Patient Name: Date of Service: [name] [name] [name] RNEY [name] [name] 10/26/2022 10:30 A M Medical Record Number: [id number] Patient Account Number: [id number] Date of Birth/Sex: Treating RN: 10/23/1960 (62 y.o. Male) Rosalio Loud Primary Care Ozella Comins: SYSTEM, PCP Other Clinician: Referring Dalila Arca: Treating Gilda Abboud/Extender: Audry Pili in Treatment: 0 Vital Signs Time Taken: 10:21 Temperature (F): 98.1 Height (in): 76 Pulse (bpm): 90 Source: Stated Respiratory Rate (breaths/min): 16 Weight (lbs): 232 Blood Pressure (mmHg): 132/85 Source: Stated Reference Range: 80 - 120 mg / dl Body Mass Index (BMI): 28.2 Electronic Signature(s) Signed: 10/27/2022 2:53:23 PM By: Rosalio Loud MSN RN CNS WTA Entered By: Rosalio Loud on 10/26/2022 10:24:02

## 2022-10-30 NOTE — Progress Notes (Signed)
ODAS, BLASKE ([id number]) [phone] [id number] Page 1 of 5 Visit Report for 10/26/2022 Abuse Risk Screen Details Patient Name: Date of Service: [name], [name] 10/26/2022 10:30 A M Medical Record Number: [id number] Patient Account Number: [id number] Date of Birth/Sex: Treating RN: 11-11-1960 (62 y.o. Male) Rosalio Loud Primary Care Jaxon Flatt: SYSTEM, PCP Other Clinician: Referring Rhodes Calvert: Treating Jaziya Obarr/Extender: Kerin Perna Weeks in Treatment: 0 Abuse Risk Screen Items Answer Electronic Signature(s) Signed: 10/27/2022 2:53:23 PM By: Rosalio Loud MSN RN CNS WTA Entered By: Rosalio Loud on 10/26/2022 11:15:39 -------------------------------------------------------------------------------- Activities of Daily Living Details Patient Name: Date of Service: [name], [name] [name] 10/26/2022 10:30 A M Medical Record Number: [id number] Patient Account Number: [id number] Date of Birth/Sex: Treating RN: 1960/11/27 (62 y.o. Male) Rosalio Loud Primary Care Tony Friscia: SYSTEM, PCP Other Clinician: Referring Jazmine Heckman: Treating Raychell Holcomb/Extender: Audry Pili in Treatment: 0 Activities of Daily Living Items Answer Activities of Daily Living (Please select one for each item) Drive Automobile Completely Able T Medications ake Completely Able Use T elephone Completely Able Care for Appearance Completely Able Use T oilet Completely Able Bath / Shower Completely Able Dress Self Completely Able Feed Self Completely Able Walk Completely Able Get In / Out Bed Completely Able Housework Completely Able Prepare Meals Completely [place] for Self Completely ETHRIDGE, FITTRO ([id number]) [phone] [id number] Page 2 of 5 Electronic Signature(s) Signed: 10/27/2022 2:53:23 PM By: Rosalio Loud MSN RN CNS WTA Entered By: Rosalio Loud on 10/26/2022  11:15:45 -------------------------------------------------------------------------------- Education Screening Details Patient Name: Date of Service: [name], [name] 10/26/2022 10:30 A M Medical Record Number: [id number] Patient Account Number: [id number] Date of Birth/Sex: Treating RN: 11/16/1960 (62 y.o. Male) Rosalio Loud Primary Care Chance Munter: SYSTEM, PCP Other Clinician: Referring Bentleigh Waren: Treating Talin Rozeboom/Extender: Audry Pili in Treatment: 0 Learning Preferences/Education Level/Primary Language Learning Preference: Explanation Highest Education Level: High School Preferred Language: English Cognitive Barrier Language Barrier: No Translator Needed: No Memory Deficit: No Emotional Barrier: No Cultural/Religious Beliefs Affecting Medical Care: No Physical Barrier Impaired Vision: No Impaired Hearing: No Decreased Hand dexterity: No Knowledge/Comprehension Knowledge Level: High Comprehension Level: High Ability to understand written instructions: High Ability to understand verbal instructions: High Motivation Anxiety Level: Calm Cooperation: Cooperative Education Importance: Acknowledges Need Interest in Health Problems: Asks Questions Perception: Coherent Willingness to Engage in Self-Management High Activities: Readiness to Engage in Self-Management High Activities: Electronic Signature(s) Signed: 10/27/2022 2:53:23 PM By: Rosalio Loud MSN RN CNS WTA Entered By: Rosalio Loud on 10/26/2022 11:15:49 Edwyna Perfect ([id number]) [id number] Page 3 of 5 -------------------------------------------------------------------------------- Fall Risk Assessment Details Patient Name: Date of Service: [name], [name] 10/26/2022 10:30 A M Medical Record Number: [id number] Patient Account Number: [id number] Date of Birth/Sex: Treating RN: 12-Dec-1960 (62 y.o. Male) Rosalio Loud Primary Care Jla Reynolds: SYSTEM, PCP Other  Clinician: Referring Joan Herschberger: Treating Wandra Babin/Extender: Audry Pili in Treatment: 0 Fall Risk Assessment Items Have you had 2 or more falls in the last 12 monthso 0 No Have you had any fall that resulted in injury in the last 12 monthso 0 No FALLS RISK SCREEN History of falling - immediate or within 3 months 0 No Secondary diagnosis (Do you have 2 or more medical diagnoseso) 0 No Ambulatory aid None/bed rest/wheelchair/nurse 0 No Crutches/cane/walker 0 No Furniture 0 No Intravenous therapy Access/Saline/Heparin Lock 0 No Gait/Transferring Normal/ bed rest/ wheelchair 0 Yes Weak (short steps with or without shuffle, stooped but able to lift head while  walking, may seek 0 No support from furniture) Impaired (short steps with shuffle, may have difficulty arising from chair, head down, impaired 0 No balance) Mental Status Oriented to own ability 0 Yes Electronic Signature(s) Signed: 10/27/2022 2:53:23 PM By: Rosalio Loud MSN RN CNS WTA Entered By: Rosalio Loud on 10/26/2022 11:15:52 -------------------------------------------------------------------------------- Foot Assessment Details Patient Name: Date of Service: [name] [name] RNEY L. 10/26/2022 10:30 A M Medical Record Number: [id number] Patient Account Number: [id number] Date of Birth/Sex: Treating RN: Aug 16, 1961 (62 y.o. Male) Rosalio Loud Primary Care Britteney Ayotte: SYSTEM, PCP Other Clinician: Referring Rabecka Brendel: Treating Danniel Grenz/Extender: Audry Pili in Treatment: 0 Foot Assessment Items Site Locations [place] ([id number]) [phone] [id number] Page 4 of 5 + = Sensation present, - = Sensation absent, C = Callus, U = Ulcer R = Redness, W = Warmth, M = Maceration, PU = Pre-ulcerative lesion F = Fissure, S = Swelling, D = Dryness Assessment Right: Left: Other Deformity: Yes No Prior Foot Ulcer: Yes No Prior Amputation: Yes No Charcot Joint: Yes  No Ambulatory Status: Ambulatory With Help Assistance Device: Cane GaitAdult nurse) Signed: 10/27/2022 2:53:23 PM By: Rosalio Loud MSN RN CNS WTA Entered By: Rosalio Loud on 10/26/2022 11:16:01 -------------------------------------------------------------------------------- Nutrition Risk Screening Details Patient Name: Date of Service: [name], [name] 10/26/2022 10:30 A M Medical Record Number: [id number] Patient Account Number: [id number] Date of Birth/Sex: Treating RN: [birth year] (62 y.o. Male) Rosalio Loud Primary Care Unnamed Hino: SYSTEM, PCP Other Clinician: Referring Sharita Bienaime: Treating Lankford Gutzmer/Extender: Kerin Perna Weeks in Treatment: 0 Height (in): 76 Weight (lbs): 232 Body Mass Index (BMI): 28.2 Nutrition Risk Screening Items Score Screening NUTRITION RISK SCREEN: I have an illness or condition that made me change the kind and/or amount of food I eat 0 No I eat fewer than two meals per day 0 No I eat few fruits and vegetables, or milk products 0 No I have three or more drinks of beer, liquor or wine almost every day 0 No I have tooth or mouth problems that make it hard for me to eat 0 No JOHM, ADAMIAN L ([id number]) [id number] Page 5 of 5 I don't always have enough money to buy the food I need 0 No I eat alone most of the time 0 No I take three or more different prescribed or over-the-counter drugs a day 0 No Without wanting to, I have lost or gained 10 pounds in the last six months 0 No I am not always physically able to shop, cook and/or feed myself 0 No Nutrition Protocols Good Risk Protocol 0 No interventions needed Moderate Risk Protocol High Risk Proctocol Risk Level: Good Risk Score: 0 Electronic Signature(s) Signed: 10/27/2022 2:53:23 PM By: Rosalio Loud MSN RN CNS WTA Entered By: Rosalio Loud on 10/26/2022 11:15:57

## 2022-10-30 NOTE — Progress Notes (Signed)
LATAURUS, BRATLAND ([id number]) [id number] Page 1 of 11 Visit Report for 10/26/2022 Chief Complaint Document Details Patient Name: Date of Service: [name] [name], [name] [name] 10/26/2022 10:30 A M Medical Record Number: [id number] Patient Account Number: [id number] Date of Birth/Sex: Treating RN: 07/09/1961 (62 y.o. Male) [name] [name] Primary Care Provider: SYSTEM, PCP Other Clinician: Referring Provider: Treating Provider/Extender: [name] [name] in Treatment: 0 Information Obtained from: Patient Chief Complaint Bilateral Foot Ulcers Electronic Signature(s) Signed: 10/26/2022 11:11:57 AM By: Worthy Keeler PA-C Entered By: Worthy [name] on 10/26/2022 11:11:56 -------------------------------------------------------------------------------- Debridement Details Patient Name: Date of Service: [name] [name] 10/26/2022 10:30 A M Medical Record Number: [id number] Patient Account Number: [id number] Date of Birth/Sex: Treating RN: [birth year] (62 y.o. Male) [name] [name] Primary Care Provider: SYSTEM, PCP Other Clinician: Referring Provider: Treating Provider/Extender: [name] [name] in Treatment: 0 Debridement Performed for Assessment: Wound #2 Right,Dorsal Foot Performed By: Physician [name] [name], PA-C Debridement Type: Debridement Severity of Tissue Pre Debridement: Fat layer exposed Level of Consciousness (Pre-procedure): Awake and Alert Pre-procedure Verification/Time Out No Taken: Start Time: 11:20 T Area Debrided ([name] x W): otal 1.8 (cm) x 1.7 (cm) = 3.06 (cm) Tissue and other material debrided: Viable, Non-Viable, Slough, Subcutaneous, Slough Level: Skin/Subcutaneous Tissue Debridement Description: Excisional Instrument: Curette Bleeding: Minimum Hemostasis Achieved: Pressure Response to Treatment: Procedure was tolerated well Level of Consciousness (Post- Awake and Alert procedure): [name] [name], [name] [name]  ([id number]) [id number] Page 2 of 11 Post Debridement Measurements of Total Wound Length: (cm) 1.8 Width: (cm) 1.7 Depth: (cm) 0.2 Volume: (cm) 0.481 Character of Wound/Ulcer Post Debridement: Stable Severity of Tissue Post Debridement: Fat layer exposed Post Procedure Diagnosis Same as Pre-procedure Electronic Signature(s) Signed: 10/26/2022 5:15:32 PM By: Worthy Keeler PA-C Signed: 10/27/2022 2:53:23 PM By: [name] Loud MSN RN CNS WTA Entered By: [name] [name] on 10/26/2022 11:21:34 -------------------------------------------------------------------------------- Debridement Details Patient Name: Date of Service: [name] [name] 10/26/2022 10:30 A M Medical Record Number: [id number] Patient Account Number: [id number] Date of Birth/Sex: Treating RN: October 25, 1960 (62 y.o. Male) [name] [name] Primary Care Provider: SYSTEM, PCP Other Clinician: Referring Provider: Treating Provider/Extender: [name] [name] in Treatment: 0 Debridement Performed for Assessment: Wound #1 Left,Anterior T Second oe Performed By: Physician [name] [name], PA-C Debridement Type: Debridement Severity of Tissue Pre Debridement: Fat layer exposed Level of Consciousness (Pre-procedure): Awake and Alert Pre-procedure Verification/Time Out No Taken: Start Time: 11:20 T Area Debrided ([name] x W): otal 0.4 (cm) x 1.1 (cm) = 0.44 (cm) Tissue and other material debrided: Viable, Non-Viable, Bone, Slough, Subcutaneous, Slough Level: Skin/Subcutaneous Tissue/Muscle/Bone Debridement Description: Excisional Instrument: Curette Specimen: Tissue [name] Number of Specimens T aken: 2 Bleeding: Moderate Hemostasis Achieved: Pressure Response to Treatment: Procedure was tolerated well Level of Consciousness (Post- Awake and Alert procedure): Post Debridement Measurements of Total Wound Length: (cm) 0.4 Width: (cm) 1.1 Depth: (cm) 0.6 Volume: (cm) 0.207 Character of  Wound/Ulcer Post Debridement: Stable Severity of Tissue Post Debridement: Necrosis of bone Post Procedure Diagnosis Same as Pre-procedure Electronic Signature(s) Signed: 10/26/2022 5:15:32 PM By: Worthy Keeler PA-C Signed: 10/27/2022 2:53:23 PM By: [name] Loud MSN RN CNS [name] [name], [name] [name] ([id number]) [id number] Page 3 of 11 Entered By: [name] [name] on 10/26/2022 11:30:42 -------------------------------------------------------------------------------- HPI Details Patient Name: Date of Service: [name] [name], [name] [name] 10/26/2022 10:30 A M Medical Record Number: [id number] Patient Account Number: [id number] Date of Birth/Sex: Treating RN: 1961/04/18 (62 y.o. Male) [name] [name] Primary Care Provider: SYSTEM, PCP Other Clinician:  Referring Provider: Treating Provider/Extender: [name] [name] in Treatment: 0 History of Present Illness HPI Description: 10-26-2022 upon evaluation today patient presents for initial inspection here in our clinic concerning issues that he has been having with wounds over the right dorsal foot and left distal toe on the dorsal surface more so. With that being said he has had an MRI of the right foot which showed no signs of osteomyelitis I see no x-rays nor MRI of the left foot at all at this point. Nonetheless the left foot actually is the one that appears there could be evidence of infection in the toe that is the 1 I am most concerned about. He is also going to require debridement of both locations I discussed that with him today. The patient's MRI was actually on 10-22-2022 which showed cellulitis of the right foot but no osteomyelitis. Has been on cefadroxil which was prescribed on 10-25-2022. With that being said it is appearing to me that the patient likely has a infection in regard to the second toe left foot where his other wound is and this is the one that has been most concerned. He actually has bone exposed I am  going to perform debridement I want to obtain samples as well to send for pathology and [name] Patient has a past medical history which is significant for diabetes mellitus type 2 with his most recent hemoglobin A1c being 5.1. 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 He also has amputation of the right second toe. The patient also has significant past medical history for chronic venous hypertension. Patient's ABIs were performed on 10-07-2022 showed a right ABI of 1.03 in the left ABI of 1.10. Electronic Signature(s) Signed: 10/26/2022 5:13:25 PM By: Worthy Keeler PA-C Previous Signature: 10/26/2022 5:10:05 PM Version By: Worthy Keeler PA-C Entered By: Worthy [name] on 10/26/2022 17:13:25 -------------------------------------------------------------------------------- Physical Exam Details Patient Name: Date of Service: [name] [name], [name] [name] RNEY [name] 10/26/2022 10:30 A M Medical Record Number: [id number] Patient Account Number: [id number] Date of Birth/Sex: Treating RN: 06-01-1961 (62 y.o. Male) [name] [name] Primary Care Provider: SYSTEM, PCP Other Clinician: Referring Provider: Treating Provider/Extender: [name] [name] in Treatment: 0 Constitutional sitting or standing blood pressure is within target range for patient.. pulse regular and within target range for patient.Marland Kitchen respirations regular, non-labored and within target range for patient.Marland Kitchen temperature within target range for patient.. Well-nourished and well-hydrated in no acute distress. Eyes conjunctiva clear no eyelid edema noted. pupils equal round and reactive to light and accommodation. MARCOANTONIO, GESSLER ([id number]) [id number] Page 4 of 11 Ears, Nose, Mouth, and Throat no gross abnormality of ear auricles or external auditory canals. normal hearing noted during conversation. mucus membranes moist. Respiratory normal breathing  without difficulty. Cardiovascular 2+ dorsalis pedis/posterior tibialis pulses. no clubbing, cyanosis, significant edema, <3 sec cap refill. Musculoskeletal normal gait and posture. no significant deformity or arthritic changes, no loss or range of motion, no clubbing. Psychiatric this patient is able to make decisions and demonstrates good insight into disease process. Alert and Oriented x 3. pleasant and cooperative. Notes Upon inspection patient's wounds did require sharp debridement I was able to clear away the slough and biofilm from the surface of the wound on the right foot he tolerated that today without complication and postdebridement the wound bed seems to be significantly improved which is great news. I am extremely pleased in that regard.  In regard to the left foot he did require sharp debridement here in regard to the second toe. He tolerated that debridement without complication. With that being said unfortunately this is where he had some bone exposed and I did subsequently take a sample of the bone both for pathology as well to sample the bone for [name] Waiting to see what this shows and make any adjustments as necessary and the treatment plan. Electronic Signature(s) Signed: 10/26/2022 5:10:49 PM By: Worthy Keeler PA-C Entered By: Worthy [name] on 10/26/2022 17:10:49 -------------------------------------------------------------------------------- Physician Orders Details Patient Name: Date of Service: [name] [name], [name] [name] 10/26/2022 10:30 A M Medical Record Number: [id number] Patient Account Number: [id number] Date of Birth/Sex: Treating RN: 1961-03-11 (62 y.o. Male) [name] [name] Primary Care Provider: SYSTEM, PCP Other Clinician: Referring Provider: Treating Provider/Extender: [name] [name] in Treatment: 0 Verbal / Phone Orders: No Diagnosis Coding ICD-10 Coding Code Description E11.621 Type 2 diabetes mellitus with foot ulcer L97.522  Non-pressure chronic ulcer of other part of left foot with fat layer exposed L97.512 Non-pressure chronic ulcer of other part of right foot with fat layer exposed M14.672 Charcot's joint, left ankle and foot 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 I87.323 Chronic venous hypertension (idiopathic) with inflammation of bilateral lower extremity Follow-up Appointments Return Appointment in 1 week. Bathing/ Shower/ Hygiene May shower; gently cleanse wound with antibacterial soap, rinse and pat dry prior to dressing wounds Additional Orders / Instructions Decrease/Stop Smoking Wound Treatment Wound #1 - T Second oe Wound Laterality: Left, Anterior Cleanser: Soap and Water 3 x Per Week/30 Days Discharge Instructions: Gently cleanse wound with antibacterial soap, rinse and pat dry prior to dressing wounds [name] [name], [name] [name] ([id number]) [id number] Page 5 of 11 Prim Dressing: Prisma 4.34 (in) 3 x Per Week/30 Days ary Discharge Instructions: Moisten w/normal saline or sterile water; Cover wound as directed. Do not remove from wound bed. Secondary Dressing: Gauze 3 x Per Week/30 Days Discharge Instructions: As directed: dry, moistened with saline or moistened with Dakins Solution Secured With: Medipore T - 82M Medipore H Soft Cloth Surgical T ape ape, 2x2 (in/yd) 3 x Per Week/30 Days Wound #2 - Foot Wound Laterality: Dorsal, Right Cleanser: Soap and Water 3 x Per Week/30 Days Discharge Instructions: Gently cleanse wound with antibacterial soap, rinse and pat dry prior to dressing wounds Prim Dressing: Prisma 4.34 (in) 3 x Per Week/30 Days ary Discharge Instructions: Moisten w/normal saline or sterile water; Cover wound as directed. Do not remove from wound bed. Secondary Dressing: Zetuvit Plus 4x4 (in/in) 3 x Per Week/30 Days Laboratory Bacteria identified in Tissue by Biopsy [name] (MICRO) [name] [name] Bacteria  identified in Wound by [name] (MICRO) [name] Code: [phone] Convenience Name: Wound [name] routine Radiology X-ray, foot Electronic Signature(s) Signed: 10/26/2022 5:15:32 PM By: Worthy Keeler PA-C Signed: 10/27/2022 2:53:23 PM By: [name] Loud MSN RN CNS WTA Entered By: [name] [name] on 10/26/2022 11:58:01 -------------------------------------------------------------------------------- Problem List Details Patient Name: Date of Service: Rend[place], [place] 10/26/2022 10:30 A M Medical Record Number: [id number] Patient Account Number: [id number] Date of Birth/Sex: Treating RN: [birth year] (62 y.o. Male) [name] [name] Primary Care Provider: SYSTEM, PCP Other Clinician: Referring Provider: Treating Provider/Extender: [name] [name] in Treatment: 0 Active Problems ICD-10 Encounter Code Description Active Date MDM Diagnosis E11.621 Type 2 diabetes mellitus with foot ulcer 10/26/2022 No Yes L97.524 Non-pressure chronic ulcer of other part of left foot with necrosis of bone 10/26/2022 No  ALTO, MESMER ([id number]) [id number] Page 6 of 11 L97.512 Non-pressure chronic ulcer of other part of right foot with fat layer exposed 10/26/2022 No Yes M14.672 Charcot's joint, left ankle and foot 10/26/2022 No Yes 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 10/26/2022 No Yes I87.323 Chronic venous hypertension (idiopathic) with inflammation of bilateral lower 10/26/2022 No Yes extremity F17.218 Nicotine dependence, cigarettes, with other nicotine-induced disorders 10/26/2022 No Yes Inactive Problems Resolved Problems Electronic Signature(s) Signed: 10/26/2022 5:14:55 PM By: Worthy Keeler PA-C Previous Signature: 10/26/2022 5:11:45 PM Version By: Worthy Keeler PA-C Previous Signature: 10/26/2022 11:11:44 AM Version By: Worthy Keeler PA-C Entered By: Worthy [name] on 10/26/2022  17:14:55 -------------------------------------------------------------------------------- Progress Note Details Patient Name: Date of Service: [name] [name] 10/26/2022 10:30 A M Medical Record Number: [id number] Patient Account Number: [id number] Date of Birth/Sex: Treating RN: Oct 06, 1960 (62 y.o. Male) [name] [name] Primary Care Provider: SYSTEM, PCP Other Clinician: Referring Provider: Treating Provider/Extender: [name] [name] in Treatment: 0 Subjective Chief Complaint Information obtained from Patient Bilateral Foot Ulcers History of Present Illness (HPI) 10-26-2022 upon evaluation today patient presents for initial inspection here in our clinic concerning issues that he has been having with wounds over the right dorsal foot and left distal toe on the dorsal surface more so. With that being said he has had an MRI of the right foot which showed no signs of osteomyelitis I see no x-rays nor MRI of the left foot at all at this point. Nonetheless the left foot actually is the one that appears there could be evidence of infection in the toe that is the 1 I am most concerned about. He is also going to require debridement of both locations I discussed that with him today. The patient's MRI was actually on 10-22-2022 which showed cellulitis of the right foot but no osteomyelitis. Has been on cefadroxil which was prescribed on 10-25-2022. With that being said it is appearing to me that the patient likely has a infection in regard to the second toe left foot where his other wound is and this is the one that has been most concerned. He actually has bone exposed I am going to perform debridement I want to obtain samples as well to send for pathology and [name] Patient has a past medical history which is significant for diabetes mellitus type 2 with his most recent hemoglobin A1c being 5.1. He also has a history of Charcot foot of the left foot as well as an amputation of the  left great toe. He also has amputation of the right second toe. The patient also has significant past medical history for chronic venous hypertension. Patient's ABIs were performed on 10-07-2022 showed a right ABI of 1.03 in the left ABI of 1.10. ASHOK, PLESHA ([id number]) [id number] Page 7 of 11 Patient History Information obtained from Patient. Allergies No Known Allergies Social History Current every day smoker - 8 cig day, Marital Status - Single, Alcohol Use - Moderate, Drug Use - No History, Caffeine Use - Rarely. Medical History Cardiovascular Patient has history of Peripheral Venous Disease Endocrine Patient has history of Type II Diabetes Denies history of Type I Diabetes Musculoskeletal Patient has history of Osteomyelitis Review of Systems (ROS) Constitutional Symptoms (General Health) Denies complaints or symptoms of Fatigue, Fever, Chills, Marked Weight Change. Eyes Denies complaints or symptoms of Dry Eyes, Vision Changes, Glasses / Contacts. Ear/Nose/Mouth/Throat Denies complaints or symptoms of Difficult clearing ears, Sinusitis. Hematologic/Lymphatic Denies complaints or symptoms of  Bleeding / Clotting Disorders, Human Immunodeficiency Virus. Respiratory Denies complaints or symptoms of Chronic or frequent coughs, Shortness of Breath. Cardiovascular Denies complaints or symptoms of Chest pain, LE edema. Genitourinary Denies complaints or symptoms of Kidney failure/ Dialysis, Incontinence/dribbling. Immunological Denies complaints or symptoms of Hives, Itching. Integumentary (Skin) Complains or has symptoms of Wounds. Neurologic Denies complaints or symptoms of Numbness/parasthesias, Focal/Weakness. Psychiatric Denies complaints or symptoms of Anxiety, Claustrophobia. Objective Constitutional sitting or standing blood pressure is within target range for patient.. pulse regular and within target range for patient.Marland Kitchen respirations  regular, non-labored and within target range for patient.Marland Kitchen temperature within target range for patient.. Well-nourished and well-hydrated in no acute distress. Vitals Time Taken: 10:21 AM, Height: 76 in, Source: Stated, Weight: 232 lbs, Source: Stated, BMI: 28.2, Temperature: 98.1 F, Pulse: 90 bpm, Respiratory Rate: 16 breaths/min, Blood Pressure: 132/85 mmHg. Eyes conjunctiva clear no eyelid edema noted. pupils equal round and reactive to light and accommodation. Ears, Nose, Mouth, and Throat no gross abnormality of ear auricles or external auditory canals. normal hearing noted during conversation. mucus membranes moist. Respiratory normal breathing without difficulty. Cardiovascular 2+ dorsalis pedis/posterior tibialis pulses. no clubbing, cyanosis, significant edema, Musculoskeletal normal gait and posture. no significant deformity or arthritic changes, no loss or range of motion, no clubbing. Psychiatric this patient is able to make decisions and demonstrates good insight into disease process. Alert and Oriented x 3. pleasant and cooperative. General Notes: Upon inspection patient's wounds did require sharp debridement I was able to clear away the slough and biofilm from the surface of the wound on the right foot he tolerated that today without complication and postdebridement the wound bed seems to be significantly improved which is great news. I am extremely pleased in that regard. In regard to the left foot he did require sharp debridement here in regard to the second toe. He tolerated that debridement without complication. With that being said unfortunately this is where he had some bone exposed and I did subsequently take a sample of the bone both for pathology as well to sample the bone for [name] Waiting to see what this shows and make any adjustments as necessary and the treatment plan. Integumentary (Hair, Skin) Wound #1 status is Open. Original cause of wound was Pressure  Injury. The date acquired was: 10/12/2022. The wound is located on the [place] T oe Second. The wound measures 1.1cm length x 1.1cm width x 0.5cm depth; 0.95cm^2 area and 0.475cm^3 volume. There is bone and joint exposed. There is no tunneling noted, however, there is undermining starting at 12:00 and ending at 10:00 with a maximum distance of 0.3cm. There is a medium amount of serosanguineous drainage noted. There is medium (34-66%) red, pink granulation within the wound bed. There is a small (1-33%) amount of necrotic tissue within the wound bed including Adherent Slough. ARIB, ULCH ([id number]) [id number] Page 8 of 11 Wound #2 status is Open. Original cause of wound was Footwear Injury. The date acquired was: 10/12/2022. The wound is located on the Right,Dorsal Foot. The wound measures 1.8cm length x 1.7cm width x 0.1cm depth; 2.403cm^2 area and 0.24cm^3 volume. There is Fat Layer (Subcutaneous Tissue) exposed. There is no tunneling or undermining noted. There is a medium amount of serosanguineous drainage noted. There is small (1-33%) red, pink granulation within the wound bed. There is a medium (34-66%) amount of necrotic tissue within the wound bed including Adherent Slough. 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 There was a Excisional  Skin/Subcutaneous Tissue/Muscle/Bone Debridement with a total area of 0.44 sq cm performed by [name] [name], PA-C. With the following instrument(s): Curette to remove Viable and Non-Viable tissue/material. Material removed includes Bone,Subcutaneous Tissue, and Slough. 2 specimens were taken by a Tissue [name] and sent to the lab per facility protocol.A Moderate amount of bleeding was controlled with Pressure. The procedure was tolerated well. Post Debridement Measurements: 0.4cm length x 1.1cm width x 0.6cm depth; 0.207cm^3 volume. Character of Wound/Ulcer Post Debridement is stable. Severity of Tissue Post Debridement is: Necrosis of bone. Post procedure Diagnosis Wound #1: Same as Pre-Procedure Wound #2 Pre-procedure diagnosis of Wound #2 is a Diabetic Wound/Ulcer of the Lower Extremity located on the Right,Dorsal Foot .Severity of Tissue Pre Debridement is: Fat layer exposed. There was a Excisional Skin/Subcutaneous Tissue Debridement with a total area of 3.06 sq cm performed by [name] [name], PA-C. With the following instrument(s): Curette to remove Viable and Non-Viable tissue/material. Material removed includes Subcutaneous Tissue and Slough and. No specimens were taken.A Minimum amount of bleeding was controlled with Pressure. The procedure was tolerated well. Post Debridement Measurements: 1.8cm length x 1.7cm width x 0.2cm depth; 0.481cm^3 volume. Character of Wound/Ulcer Post Debridement is stable. Severity of Tissue Post Debridement is: Fat layer exposed. Post procedure Diagnosis Wound #2: Same as Pre-Procedure Plan Follow-up Appointments: Return Appointment in 1 week. Bathing/ Shower/ Hygiene: May shower; gently cleanse wound with antibacterial soap, rinse and pat dry prior to dressing wounds Additional Orders / Instructions: Decrease/Stop Smoking Laboratory ordered were: Biopsy specimen [name], Wound [name] routine Radiology ordered were: X-ray, foot WOUND #1: - T  Second oe Wound Laterality: Left, Anterior Cleanser: Soap and Water 3 x Per Week/30 Days Discharge Instructions: Gently cleanse wound with antibacterial soap, rinse and pat dry prior to dressing wounds Prim Dressing: Prisma 4.34 (in) 3 x Per Week/30 Days ary Discharge Instructions: Moisten w/normal saline or sterile water; Cover wound as directed. Do not remove from wound bed. Secondary Dressing: Gauze 3 x Per Week/30 Days Discharge Instructions: As directed: dry, moistened with saline or moistened with Dakins Solution Secured With: Medipore T - 59M Medipore H Soft Cloth Surgical T ape ape, 2x2 (in/yd) 3 x Per Week/30 Days WOUND #2: - Foot Wound Laterality: Dorsal, Right Cleanser: Soap and Water 3 x Per Week/30 Days Discharge Instructions: Gently cleanse wound with antibacterial soap, rinse and pat dry prior to dressing wounds Prim Dressing: Prisma 4.34 (in) 3 x Per Week/30 Days ary Discharge Instructions: Moisten w/normal saline or sterile water; Cover wound as directed. Do not remove from wound bed. Secondary Dressing: Zetuvit Plus 4x4 (in/in) 3 x Per Week/30 Days 1. I would recommend currently that the patient should continue to utilize regular dressing changes the facility can definitely handle this. With that being said we are using Prisma for both the toe as well as the top of the foot we need to try to keep this from drying out in regard to the toe. 2. I am also  can recommend that we should use a bordered foam dressing on the top of the foot and for the toe we will use the roll gauze to secure in place. 3. The patient also is a current smoker and we had a discussion with him about smoking cessation. I think that this is going to be something that is definitely ALEXANDRU, SARDUY ([id number]) [id number] Page 9 of 11 necessary especially in light of everything that were seen I think that this is going to affect his ability to heal which is something that definitely  he needs to be aware of and needs to be hopefully proactive and conscious up as far as trying to stop smoking. Both the nurse Jocelyn Lamer and I spoke with patient today for about 5 minutes in this regard. 4. I am going to send out the pathology of the second toe bone as well as the [name] on the bone as well in order to evaluate for any signs of active infection. The patient is in agreement with this plan and we will subsequently see how things stand at follow-up next week but again at this point we definitely need to get the x-ray done on the left foot as well as these lab studies back. Obviously the concern is osteomyelitis of the left second toe. We will see patient back for reevaluation in 1 week here in the clinic. If anything worsens or changes patient will contact our office for additional recommendations. Electronic Signature(s) Signed: 10/26/2022 5:15:06 PM By: Worthy Keeler PA-C Previous Signature: 10/26/2022 5:13:42 PM Version By: Worthy Keeler PA-C Entered By: Worthy [name] on 10/26/2022 17:15:06 -------------------------------------------------------------------------------- ROS/PFSH Details Patient Name: Date of Service: [name] [name] 10/26/2022 10:30 A M Medical Record Number: [id number] Patient Account Number: [id number] Date of Birth/Sex: Treating RN: 07-07-1961 (62 y.o. Male) [name] [name] Primary Care Provider: SYSTEM, PCP Other Clinician: Referring Provider: Treating Provider/Extender: [name] [name] in Treatment: 0 Information Obtained From Patient Constitutional Symptoms (General Health) Complaints and Symptoms: Negative for: Fatigue; Fever; Chills; Marked Weight Change Eyes Complaints and Symptoms: Negative for: Dry Eyes; Vision Changes; Glasses / Contacts Ear/Nose/Mouth/Throat Complaints and Symptoms: Negative for: Difficult clearing ears; Sinusitis Hematologic/Lymphatic Complaints and Symptoms: Negative for: Bleeding / Clotting  Disorders; Human Immunodeficiency Virus Respiratory Complaints and Symptoms: Negative for: Chronic or frequent coughs; Shortness of Breath Cardiovascular Complaints and Symptoms: Negative for: Chest pain; LE edema Medical History: Positive for: Peripheral Venous Disease Genitourinary Complaints and Symptoms: AJ, HENSHAW ([id number]) [id number] Page 10 of 11 Negative for: Kidney failure/ Dialysis; Incontinence/dribbling Immunological Complaints and Symptoms: Negative for: Hives; Itching Integumentary (Skin) Complaints and Symptoms: Positive for: Wounds Neurologic Complaints and Symptoms: Negative for: Numbness/parasthesias; Focal/Weakness Psychiatric Complaints and Symptoms: Negative for: Anxiety; Claustrophobia Endocrine Medical History: Positive for: Type II Diabetes Negative for: Type I Diabetes Musculoskeletal Medical History: Positive for: Osteomyelitis Oncologic Immunizations Pneumococcal Vaccine: Received Pneumococcal Vaccination: No Implantable Devices None Family and Social History Current every day smoker - 8 cig day; Marital Status - Single; Alcohol Use: Moderate; Drug Use: No History; Caffeine Use: Rarely Electronic Signature(s) Signed: 10/26/2022 5:15:32 PM By: Worthy Keeler PA-C Signed: 10/27/2022 2:53:23 PM By: [name] Loud MSN RN CNS WTA Entered By: [name] [name] on 10/26/2022 11:15:35 -------------------------------------------------------------------------------- SuperBill Details Patient Name: Date of Service: [name] Harps RNEY Carlean Jews 10/26/2022 Medical Record Number: [id number] Patient Account Number: [id number] Date of Birth/Sex: Treating RN: Mar 01, 1961 (62 y.o. 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 I87.323 Chronic venous hypertension (idiopathic) with inflammation of bilateral lower extremity F17.218 Nicotine dependence, cigarettes, with other nicotine-induced disorders Facility Procedures : CPT4 Code: [id number] Description: [id number] - DEB SUBQ TISSUE 20 SQ CM/< ICD-10 Diagnosis Description L97.512 Non-pressure chronic ulcer of other part of right foot with fat layer exposed Modifier: Quantity: 1 : CPT4 Code: [id number] Description: [id number] - DEB BONE 20 SQ CM/< ICD-10 Diagnosis Description L97.524 Non-pressure chronic ulcer of other part of left foot with necrosis of bone Modifier: Quantity: 1 : CPT4 Code: [id number] Description: 99406-SMOKING CESSATION 3-10MINS ICD-10 Diagnosis Description F17.218 Nicotine dependence, cigarettes, with other nicotine-induced disorders Modifier: Quantity: 1 Physician Procedures : CPT4: Description Modifier Code [id number] - WC PHYS LEVEL 4 - NEW PT 25 ICD-10 Diagnosis Description E11.621 Type 2 diabetes mellitus with foot ulcer L97.512 Non-pressure chronic ulcer of other part of right foot with fat layer exposed F17.218  Nicotine dependence, cigarettes, with other nicotine-induced disorders Quantity: 1 : CPT4: [id number] 11042 - WC PHYS SUBQ TISS 20 SQ CM ICD-10 Diagnosis Description L97.512 Non-pressure chronic ulcer of other part of right foot with fat layer exposed Quantity: 1 : [id number] Debridement; bone (includes epidermis, dermis, subQ tissue, muscle and/or fascia, if performed) 1st 20 sqcm or less ICD-10 Diagnosis Description L97.524 Non-pressure chronic ulcer of other part of left foot with necrosis of  bone Quantity: 1 : CPT4: 99406 99406- SMOKING CESSATION 3-10 MINS ICD-10 Diagnosis Description F17.218 Nicotine dependence, cigarettes, with other nicotine-induced disorders Quantity: 1 Electronic Signature(s) Signed: 10/26/2022 5:15:23 PM By: Worthy Keeler PA-C Previous Signature: 10/26/2022 5:14:32 PM Version By: Worthy Keeler PA-C Entered By: Worthy [name] on 10/26/2022 17:15:22

## 2022-11-02 ENCOUNTER — Encounter: Payer: Medicare Other | Attending: Internal Medicine | Admitting: Internal Medicine

## 2022-11-02 DIAGNOSIS — I87323 Chronic venous hypertension (idiopathic) with inflammation of bilateral lower extremity: Secondary | ICD-10-CM | POA: Insufficient documentation

## 2022-11-02 DIAGNOSIS — F17218 Nicotine dependence, cigarettes, with other nicotine-induced disorders: Secondary | ICD-10-CM | POA: Insufficient documentation

## 2022-11-02 DIAGNOSIS — M14672 Charcot's joint, left ankle and foot: Secondary | ICD-10-CM | POA: Insufficient documentation

## 2022-11-02 DIAGNOSIS — E11621 Type 2 diabetes mellitus with foot ulcer: Secondary | ICD-10-CM | POA: Diagnosis present

## 2022-11-02 DIAGNOSIS — Z89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: Secondary | ICD-10-CM | POA: Diagnosis not present

## 2022-11-02 DIAGNOSIS — L97524 Non-pressure chronic ulcer of other part of left foot with necrosis of bone: Secondary | ICD-10-CM | POA: Insufficient documentation

## 2022-11-02 DIAGNOSIS — L97512 Non-pressure chronic ulcer of other part of right foot with fat layer exposed: Secondary | ICD-10-CM | POA: Diagnosis not present

## 2022-11-04 NOTE — Progress Notes (Signed)
IRENEO, SICKEL ([id number]) [id number] Page 1 of 6 Visit Report for 11/02/2022 HPI Details Patient Name: Date of Service: [name] [name], [name] [name] 11/02/2022 1:15 PM Medical Record Number: [id number] Patient Account Number: [id number] Date of Birth/Sex: Treating RN: [birth year] (62 y.o. [name] Primary Care Provider: SYSTEM, PCP Other Clinician: Referring Provider: Treating Provider/Extender: RO BSO N, MICHA EL Katheren Shams Weeks in Treatment: 1 History of Present Illness HPI Description: 10-26-2022 upon evaluation today patient presents for initial inspection here in our clinic concerning issues that he has been having with wounds over the right dorsal foot and left distal toe on the dorsal surface more so. With that being said he has had an MRI of the right foot which showed no signs of osteomyelitis I see no x-rays nor MRI of the left foot at all at this point. Nonetheless the left foot actually is the one that appears there could be evidence of infection in the toe that is the 1 I am most concerned about. He is also going to require debridement of both locations I discussed that with him today. The patient's MRI was actually on 10-22-2022 which showed cellulitis of the right foot but no osteomyelitis. Has been on cefadroxil which was prescribed on 10-25-2022. With that being said it is appearing to me that the patient likely has a infection in regard to the second toe left foot where his other wound is and this is the one that has been most concerned. He actually has bone exposed I am going to perform debridement I want to obtain samples as well to send for pathology and culture. Patient has a past medical history which is significant for diabetes mellitus type 2 with his most recent hemoglobin A1c being 5.1. 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 He also has amputation of the right second toe. The  patient also has significant past medical history for chronic venous hypertension. Patient's ABIs were performed on 10-07-2022 showed a right ABI of 1.03 in the left ABI of 1.10. 3/7; this was a patient who is new to our clinic last week. He has a wound on his left second toe anteriorly and the right dorsal foot. Earlier this week I received the culture report and organism responsible for the infection. I was not even sure how did identify this. The bone culture from last week showed osteomyelitis on the left second toe.. The area on the right dorsal foot looks a lot better no evidence of infection Electronic Signature(s) Signed: 11/02/2022 4:44:08 PM By: Linton Ham MD Entered By: Linton Ham on 11/02/2022 15:41:25 -------------------------------------------------------------------------------- Physical Exam Details Patient Name: Date of Service: [name] Harps RNEY [name] 11/02/2022 1:15 PM Medical Record Number: [id number] Patient Account Number: [id number] Date of Birth/Sex: Treating RN: 08-01-1961 (62 y.o. [name] Primary Care Provider: SYSTEM, PCP Other Clinician: Referring Provider: Treating Provider/Extender: RO BSO N, MICHA EL Katheren Shams Weeks in Treatment: 1 Constitutional Patient is hypertensive.Marland Kitchen Heart rate irregular.Marland Kitchen Respiratory effort pronounced and rate above normal.. Patient is febrile today.. . [name] [name], [name] [name] ([id number]) [id number] Page 2 of 6 Notes Wound exam; no debridement was required in either area. There is no evidence of active infection but still exposed bone in the second toe Electronic Signature(s) Signed: 11/02/2022 4:44:08 PM By: Linton Ham MD Entered By: Linton Ham on 11/02/2022 15:42:04 -------------------------------------------------------------------------------- Physician Orders Details Patient Name: Date of Service: [name] [name], [name] [name]  11/02/2022 1:15 PM Medical Record Number: [id number] Patient Account  Number: [id number] Date of Birth/Sex: Treating RN: 12-18-1960 (62 y.o. [name] Primary Care Provider: SYSTEM, PCP Other Clinician: Referring Provider: Treating Provider/Extender: RO BSO N, MICHA EL Katheren Shams Weeks in Treatment: 1 Verbal / Phone Orders: No Diagnosis Coding Follow-up Appointments ppointment in 2 weeks. - T antibiotic given 11/02/22 ake Return A Bathing/ Shower/ Hygiene May shower; gently cleanse wound with antibacterial soap, rinse and pat dry prior to dressing wounds Additional Orders / Instructions Decrease/Stop Smoking Other: Administrator (Kim--Fax # [phone]) Wound Treatment Wound #1 - T Second oe Wound Laterality: Left, Anterior Cleanser: Soap and Water 3 x Per Week/30 Days Discharge Instructions: Gently cleanse wound with antibacterial soap, rinse and pat dry prior to dressing wounds Prim Dressing: Prisma 4.34 (in) 3 x Per Week/30 Days ary Discharge Instructions: Moisten w/normal saline or sterile water; Cover wound as directed. Do not remove from wound bed. Secondary Dressing: Gauze 3 x Per Week/30 Days Discharge Instructions: As directed: dry, moistened with saline or moistened with Dakins Solution Secured With: Medipore T - 12M Medipore H Soft Cloth Surgical T ape ape, 2x2 (in/yd) 3 x Per Week/30 Days Wound #2 - Foot Wound Laterality: Dorsal, Right Cleanser: Soap and Water 3 x Per Week/30 Days Discharge Instructions: Gently cleanse wound with antibacterial soap, rinse and pat dry prior to dressing wounds Prim Dressing: Prisma 4.34 (in) 3 x Per Week/30 Days ary Discharge Instructions: Moisten w/normal saline or sterile water; Cover wound as directed. Do not remove from wound bed. Secondary Dressing: Zetuvit Plus 4x4 (in/in) 3 x Per Week/30 Days Electronic Signature(s) Signed: 11/03/2022 12:47:02 PM By: Rosalio Loud MSN RN CNS WTA Signed: 11/07/2022 5:35:34 PM By: Linton Ham MD Previous Signature: 11/02/2022 4:44:08 PM Version  By: Linton Ham MD Previous Signature: 11/02/2022 5:13:55 PM Version By: Rosalio Loud MSN RN CNS WTA Entered By: Rosalio Loud on 11/03/2022 11:35:13 [name] [name] ([id number]) [id number] Page 3 of 6 -------------------------------------------------------------------------------- Problem List Details Patient Name: Date of Service: [name] [name], [name] [name] 11/02/2022 1:15 PM Medical Record Number: [id number] Patient Account Number: [id number] Date of Birth/Sex: Treating RN: Jan 28, 1961 (62 y.o. 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 10/26/2022 No Yes I87.323 Chronic venous hypertension (idiopathic) with inflammation of bilateral lower 10/26/2022 No Yes extremity F17.218 Nicotine dependence, cigarettes, with other nicotine-induced disorders 10/26/2022 No Yes Inactive Problems Resolved Problems Electronic Signature(s) Signed: 11/02/2022 4:44:08 PM By: Linton Ham MD Entered By: Linton Ham on 11/02/2022 15:38:24 [name] [name] ([id number]) [id number] Page 4 of 6 -------------------------------------------------------------------------------- Progress Note Details Patient Name: Date of Service: [name] [name], [name] [name] 11/02/2022 1:15 PM Medical Record Number: [id number] Patient Account Number: [id number] Date of Birth/Sex: Treating RN: 1960/11/02 (62 y.o. [name] Primary Care Provider: SYSTEM, PCP Other Clinician: Referring Provider: Treating Provider/Extender: RO BSO N, MICHA EL Katheren Shams Weeks in Treatment: 1 Subjective History of Present Illness (HPI) 10-26-2022 upon evaluation today patient presents  for initial inspection here in our clinic concerning issues that he has been having with wounds over the right dorsal foot and left distal toe on the dorsal surface more so. With that being said he has had an MRI of the right foot which showed no signs of osteomyelitis I see no x-rays nor MRI of the left foot at all at this point. Nonetheless the left foot actually is the one that appears there could be evidence of infection in the toe that is the 1 I am most concerned about. He is also going to require debridement of both locations I discussed that with him today. The patient's MRI was actually on 10-22-2022 which showed cellulitis of the right foot but no osteomyelitis. Has been on cefadroxil which was prescribed on 10-25-2022. With that being said it is appearing to me that the patient likely has a infection in regard to the second toe left foot where his other wound is and this is the one that has been most concerned. He actually has bone exposed I am going to perform debridement I want to obtain samples as well to send for pathology and culture. Patient has a past medical history which is significant for diabetes mellitus type 2 with his most recent hemoglobin A1c being 5.1. 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 He also has amputation of the right second toe. The patient also has significant past medical history for chronic venous hypertension. Patient's ABIs were performed on 10-07-2022 showed a right ABI of 1.03 in the left ABI of 1.10. 3/7; this was a patient who is new to our clinic last week. He has a wound on his left second toe anteriorly and the right dorsal  foot. Earlier this week I received the culture report and organism responsible for the infection. I was not even sure how did identify this. The bone culture from last week showed osteomyelitis on the left second toe.. The area on the right dorsal foot looks a lot better no evidence of infection Objective Constitutional Patient is hypertensive.Marland Kitchen Heart rate irregular.Marland Kitchen Respiratory effort pronounced and rate above normal.. Patient is febrile today.. Vitals Time Taken: 2:47 PM, Height: 76 in, Weight: 232 lbs, BMI: 28.2, Temperature: 98.0 F, Pulse: 84 bpm, Respiratory Rate: 16 breaths/min, Blood Pressure: 137/87 mmHg. General Notes: Wound exam; no debridement was required in either area. There is no evidence of active infection but still exposed bone in the second toe Integumentary (Hair, Skin) Wound #1 status is Open. Original cause of wound was Pressure Injury. The date acquired was: 10/12/2022. The wound has been in treatment 1 weeks. The wound is located on the [place] T Second. The wound measures 1.1cm length x 1cm width x 0.3cm depth; 0.864cm^2 area and 0.259cm^3 volume. oe There is bone and joint exposed. There is undermining starting at 3:00 and ending at 4:00 with a maximum distance of 0.7cm. There is a medium amount of serosanguineous drainage noted. There is medium (34-66%) red, pink granulation within the wound bed. There is a small (1-33%) amount of necrotic tissue within the wound bed including Adherent Slough. Wound #2 status is Open. Original cause of wound was Footwear Injury. The date acquired was: 10/12/2022. The wound has been in treatment 1 weeks. The wound is located on the Right,Dorsal Foot. The wound measures 0.4cm length x 1.2cm width x 0.1cm depth; 0.377cm^2 area and 0.038cm^3 volume. There is Fat Layer (Subcutaneous Tissue)  exposed. There is a medium amount of serosanguineous drainage noted. There is small (1-33%) red, pink granulation within the wound bed. There is a  medium (34-66%) amount of necrotic tissue within the wound bed including Adherent Slough. 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 - T antibiotic given 11/02/22 ake Bathing/ Shower/ Hygiene: May shower; gently cleanse wound with antibacterial soap, rinse and pat dry prior to dressing wounds Additional Orders / Instructions: Decrease/Stop Smoking WOUND #1: - T Second Wound Laterality: Left, Anterior oe Cleanser: Soap and Water 3 x Per Week/30 Days Discharge Instructions: Gently cleanse wound with antibacterial soap, rinse and pat dry prior to dressing wounds Prim Dressing: Prisma 4.34 (in) 3 x Per Week/30 Days ary Discharge Instructions: Moisten w/normal saline or sterile water; Cover wound as directed. Do not remove from wound bed. Secondary Dressing: Gauze 3 x Per Week/30 Days Discharge Instructions: As directed: dry, moistened with saline or moistened with Dakins Solution Secured With: Medipore T - 40M Medipore H Soft Cloth Surgical T ape ape, 2x2 (in/yd) 3 x Per Week/30 Days WOUND #2: - Foot Wound Laterality: Dorsal, Right Cleanser: Soap and Water 3 x Per Week/30 Days Discharge Instructions: Gently cleanse wound with antibacterial soap, rinse and pat dry prior to dressing wounds Prim Dressing: Prisma 4.34 (in) 3 x Per Week/30 Days ary Discharge Instructions: Moisten w/normal saline or sterile water; Cover wound as directed. Do not  remove from wound bed. Secondary Dressing: Zetuvit Plus 4x4 (in/in) 3 x Per Week/30 Days 1. #1 we used Prisma on the second toe tape. 2. We are also using Prisma to the right dorsal foot which I might otherwise not do except the patient is living in an assisted living. 3. I gave him 2 weeks of Septra. We will monitor him at that point and represcribe antibiotics as necessary. Electronic Signature(s) Signed: 11/02/2022 4:44:08 PM By: Linton Ham MD Entered By: Linton Ham on 11/02/2022 15:43:18 -------------------------------------------------------------------------------- SuperBill Details Patient Name: Date of Service: [name] Harps RNEY [name] 11/02/2022 Medical Record Number: [id number] Patient Account Number: [id number] Date of Birth/Sex: Treating RN: 05-10-1961 (62 y.o. 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 [name] [name], [name] [name] ([id number]) [id number] Page 6 of 6 [phone] Chronic venous hypertension (idiopathic) with inflammation of bilateral lower extremity F17.218 Nicotine dependence, cigarettes, with other nicotine-induced disorders Facility Procedures : CPT4 Code: [id number] Description: 99213 - WOUND CARE VISIT-LEV 3 EST PT Modifier: Quantity: 1 Physician Procedures : CPT4 Code Description Modifier [id number] - WC PHYS LEVEL 3 - EST PT ICD-10 Diagnosis Description E11.621 Type 2 diabetes mellitus with foot ulcer L97.524 Non-pressure chronic ulcer of other part of  left  foot with necrosis of bone L97.512  Non-pressure chronic ulcer of other part of right foot with fat layer exposed Quantity: 1 Electronic Signature(s) Signed: 11/02/2022 4:44:08 PM By: Linton Ham MD Entered By: Linton Ham on 11/02/2022 15:43:43

## 2022-11-04 NOTE — Progress Notes (Signed)
SHRIRAM, JIN ([id number]) [id number] Page 1 of 9 Visit Report for 11/02/2022 Arrival Information Details Patient Name: Date of Service: [name] [name], [name] [name] 11/02/2022 1:15 PM Medical Record Number: [id number] Patient Account Number: [id number] Date of Birth/Sex: Treating RN: Jun 23, 1961 (62 y.o. [name] [name] Primary Care [name] [name]: SYSTEM, PCP Other Clinician: Referring [name] [name]: Treating [name] [name]/Extender: RO BSO N, [name] [name] in Treatment: 1 Visit Information History Since Last Visit Added or deleted any medications: No Patient Arrived: Wheel Chair Has Dressing in Place as Prescribed: Yes Arrival Time: 13:40 Pain Present Now: No Accompanied By: self Transfer Assistance: None Patient Requires Transmission-Based Precautions: No Patient Has Alerts: Yes Patient Alerts: Diabetes Type 2 R ABI 1.03 [name] Electronic Signature(s) Signed: 11/02/2022 5:13:55 PM By: Rosalio Loud MSN RN CNS WTA Entered By: Rosalio Loud on 11/02/2022 13:40:24 -------------------------------------------------------------------------------- Clinic Level of Care Assessment Details Patient Name: Date of Service: [name] [name], [name] [name] 11/02/2022 1:15 PM Medical Record Number: [id number] Patient Account Number: [id number] Date of Birth/Sex: Treating RN: [birth year] (62 y.o. [name] [name] Primary Care [name] [name]: SYSTEM, PCP Other Clinician: Referring [name] [name]: Treating [name] [name]/Extender: RO BSO N, [name] EL [name] [name] in Treatment: 1 Clinic Level of Care Assessment Items TOOL 4 Quantity Score X- 1 0 Use when only an EandM is performed on FOLLOW-UP visit ASSESSMENTS - Nursing Assessment / Reassessment X- 1 10 Reassessment of Co-morbidities (includes updates in patient status) X- 1 5 Reassessment of Adherence to Treatment Plan ASSESSMENTS - Wound and Skin A ssessment / Reassessment '[]'$  - 0 Simple Wound Assessment / Reassessment - one wound X- 2  5 Complex Wound Assessment / Reassessment - multiple wounds [name] [name], [name] [name] ([id number]) [id number] Page 2 of 9 '[]'$  - 0 Dermatologic / Skin Assessment (not related to wound area) ASSESSMENTS - Focused Assessment '[]'$  - 0 Circumferential Edema Measurements - multi extremities '[]'$  - 0 Nutritional Assessment / Counseling / Intervention '[]'$  - 0 Lower Extremity Assessment (monofilament, tuning fork, pulses) '[]'$  - 0 Peripheral Arterial Disease Assessment (using hand held doppler) ASSESSMENTS - Ostomy and/or Continence Assessment and Care '[]'$  - 0 Incontinence Assessment and Management '[]'$  - 0 Ostomy Care Assessment and Management (repouching, etc.) PROCESS - Coordination of Care '[]'$  - 0 Simple Patient / Family Education for ongoing care '[]'$  - 0 Complex (extensive) Patient / Family Education for ongoing care X- 1 10 Staff obtains Programmer, systems, Records, T Results / Process Orders est '[]'$  - 0 Staff telephones HHA, Nursing Homes / Clarify orders / etc '[]'$  - 0 Routine Transfer to another Facility (non-emergent condition) '[]'$  - 0 Routine Hospital Admission (non-emergent condition) '[]'$  - 0 New Admissions / Biomedical engineer / Ordering NPWT Apligraf, etc. , '[]'$  - 0 Emergency Hospital Admission (emergent condition) X- 1 10 Simple Discharge Coordination '[]'$  - 0 Complex (extensive) Discharge Coordination PROCESS - Special Needs '[]'$  - 0 Pediatric / Minor Patient Management '[]'$  - 0 Isolation Patient Management '[]'$  - 0 Hearing / Language / Visual special needs '[]'$  - 0 Assessment of Community assistance (transportation, D/C planning, etc.) '[]'$  - 0 Additional assistance / Altered mentation '[]'$  - 0 Support Surface(s) Assessment (bed, cushion, seat, etc.) INTERVENTIONS - Wound Cleansing / Measurement '[]'$  - 0 Simple Wound Cleansing - one wound X- 2 5 Complex Wound Cleansing - multiple wounds '[]'$  - 0 Wound Imaging (photographs - any number of wounds) '[]'$  - 0 Wound Tracing  (instead of photographs) '[]'$  - 0 Simple Wound Measurement - one wound X- 2 5 Complex Wound Measurement -  multiple wounds INTERVENTIONS - Wound Dressings X - Small Wound Dressing one or multiple wounds 2 10 '[]'$  - 0 Medium Wound Dressing one or multiple wounds '[]'$  - 0 Large Wound Dressing one or multiple wounds '[]'$  - 0 Application of Medications - topical '[]'$  - 0 Application of Medications - injection INTERVENTIONS - Miscellaneous '[]'$  - 0 External ear exam '[]'$  - 0 Specimen Collection (cultures, biopsies, blood, body fluids, etc.) '[]'$  - 0 Specimen(s) / Culture(s) sent or taken to Lab for analysis '[]'$  - 0 Patient Transfer (multiple staff / Stormy Fabian / Similar devices) [name] [name] ([id number]) [id number] Page 3 of 9 '[]'$  - 0 Simple Staple / Suture removal (25 or less) '[]'$  - 0 Complex Staple / Suture removal (26 or more) '[]'$  - 0 Hypo / Hyperglycemic Management (close monitor of Blood Glucose) '[]'$  - 0 Ankle / Brachial Index (ABI) - do not check if billed separately X- 1 5 Vital Signs Has the patient been seen at the hospital within the last three years: Yes Total Score: 90 Level Of Care: New/Established - Level 3 Electronic Signature(s) Signed: 11/02/2022 5:13:55 PM By: Rosalio Loud MSN RN CNS WTA Entered By: Rosalio Loud on 11/02/2022 14:48:47 -------------------------------------------------------------------------------- Encounter Discharge Information Details Patient Name: Date of Service: [name] [name] 11/02/2022 1:15 PM Medical Record Number: [id number] Patient Account Number: [id number] Date of Birth/Sex: Treating RN: 07-Mar-1961 (62 y.o. [name] [name] Primary Care [name] [name]: SYSTEM, PCP Other Clinician: Referring [name] [name]: Treating [name] [name]/Extender: RO BSO N, [name] [name] in Treatment: 1 Encounter Discharge Information Items Discharge Condition: Stable Ambulatory Status: Wheelchair Discharge Destination:  Home Transportation: Private Auto Accompanied By: self Schedule Follow-up Appointment: Yes Clinical Summary of Care: Electronic Signature(s) Signed: 11/02/2022 5:13:55 PM By: Rosalio Loud MSN RN CNS WTA Entered By: Rosalio Loud on 11/02/2022 14:49:51 -------------------------------------------------------------------------------- Lower Extremity Assessment Details Patient Name: Date of Service: [name] [name] 11/02/2022 1:15 PM Medical Record Number: [id number] Patient Account Number: [id number] Date of Birth/Sex: Treating RN: 04-16-1961 (62 y.o. [name] [name] Primary Care Shresta Risden: SYSTEM, PCP Other Clinician: Referring Garnett Rekowski: Treating Summerlyn Fickel/Extender: RO BSO N, [name] [name] in Treatment: 1 [name] [name] ([id number]) [id number] Page 4 of 9 Edema Assessment Assessed: [Left: Yes] [Right: Yes] [Left: Edema] [Right: :] Calf Left: Right: Point of Measurement: 38 cm From Medial Instep 37 cm 35.7 cm Ankle Left: Right: Point of Measurement: 15 cm From Medial Instep 24 cm 24 cm Vascular Assessment Pulses: Dorsalis Pedis Palpable: [Left:Yes] [Right:Yes] Electronic Signature(s) Signed: 11/02/2022 5:13:55 PM By: Rosalio Loud MSN RN CNS WTA Entered By: Rosalio Loud on 11/02/2022 14:02:27 -------------------------------------------------------------------------------- Multi Wound Chart Details Patient Name: Date of Service: [name] [name] 11/02/2022 1:15 PM Medical Record Number: [id number] Patient Account Number: [id number] Date of Birth/Sex: Treating RN: Dec 06, 1960 (62 y.o. [name] [name] Primary Care Chasty Randal: SYSTEM, PCP Other Clinician: Referring Ostin Mathey: Treating Marcelino Campos/Extender: RO BSO N, [name] EL [name] [name] in Treatment: 1 Vital Signs Height(in): 76 Pulse(bpm): 84 Weight(lbs): 232 Blood Pressure(mmHg): 137/87 Body Mass Index(BMI): 28.2 Temperature(F): 98.0 Respiratory Rate(breaths/min):  16 [1:Photos:] [N/A:N/A] Left, Anterior T Second oe Right, Dorsal Foot N/A Wound Location: Pressure Injury Footwear Injury N/A Wounding Event: Diabetic Wound/Ulcer of the Lower Diabetic Wound/Ulcer of the Lower N/A Primary Etiology: Extremity Extremity Peripheral Venous Disease, Type II Peripheral Venous Disease, Type II N/A Comorbid History: Diabetes, Osteomyelitis Diabetes, Osteomyelitis 10/12/2022 10/12/2022 N/A Date Acquired: 1 1 N/A [name] of Treatment: Open Open N/A Wound Status: Linde Gillis  [name] ([id number]) [id number] Page 5 of 9 No No N/A Wound Recurrence: Yes No N/A Pending A mputation on Presentation: 1.1x1x0.3 0.4x1.2x0.1 N/A Measurements [name] x W x D (cm) 0.864 0.377 N/A A (cm) : rea 0.259 0.038 N/A Volume (cm) : 9.10% 84.30% N/A % Reduction in A rea: 45.50% 84.20% N/A % Reduction in Volume: 3 Starting Position 1 (o'clock): 4 Ending Position 1 (o'clock): 0.7 Maximum Distance 1 (cm): Yes N/A N/A Undermining: Grade 2 Grade 1 N/A Classification: Medium Medium N/A Exudate A mount: Serosanguineous Serosanguineous N/A Exudate Type: red, brown red, brown N/A Exudate Color: Medium (34-66%) Small (1-33%) N/A Granulation A mount: Red, Pink Red, Pink N/A Granulation Quality: Small (1-33%) Medium (34-66%) N/A Necrotic A mount: Joint: Yes Fat Layer (Subcutaneous Tissue): Yes N/A Exposed Structures: Bone: Yes Fascia: No Fascia: No Tendon: No Fat Layer (Subcutaneous Tissue): No Muscle: No Tendon: No Joint: No Muscle: No Bone: No N/A Small (1-33%) N/A Epithelialization: Treatment Notes Electronic Signature(s) Signed: 11/02/2022 5:13:55 PM By: Rosalio Loud MSN RN CNS WTA Entered By: Rosalio Loud on 11/02/2022 14:47:04 -------------------------------------------------------------------------------- Pain Assessment Details Patient Name: Date of Service: [name] [name] 11/02/2022 1:15 PM Medical Record Number:  [id number] Patient Account Number: [id number] Date of Birth/Sex: Treating RN: [birth year] (62 y.o. [name] [name] Primary Care Arney Mayabb: SYSTEM, PCP Other Clinician: Referring Dianne Bady: Treating Peyton Rossner/Extender: RO BSO N, [name] EL [name] [name] in Treatment: 1 Active Problems Location of Pain Severity and Description of Pain Patient Has Paino No Site Locations [place], [place] ([id number]) [id number] Page 6 of 9 Pain Management and Medication Current Pain Management: Electronic Signature(s) Signed: 11/02/2022 5:13:55 PM By: Rosalio Loud MSN RN CNS WTA Entered By: Rosalio Loud on 11/02/2022 13:48:58 -------------------------------------------------------------------------------- Patient/Caregiver Education Details Patient Name: Date of Service: [name] [name] 3/7/2024andnbsp1:15 PM Medical Record Number: [id number] Patient Account Number: [id number] Date of Birth/Gender: Treating RN: 24-Nov-1960 (62 y.o. [name] [name] Primary Care Physician: SYSTEM, PCP Other Clinician: Referring Physician: Treating Physician/Extender: RO BSO N, [name] [name] in Treatment: 1 Education Assessment Education Provided To: Patient Education Topics Provided Wound/Skin Impairment: Handouts: Caring for Your Ulcer Methods: Explain/Verbal Responses: State content correctly Electronic Signature(s) Signed: 11/02/2022 5:13:55 PM By: Rosalio Loud MSN RN CNS WTA Entered By: Rosalio Loud on 11/02/2022 14:49:07 -------------------------------------------------------------------------------- Wound Assessment Details Patient Name: Date of Service: [name] [name] 11/02/2022 1:15 PM Medical Record Number: [id number] Patient Account Number: [id number] Date of Birth/Sex: Treating RN: [birth year] (62 y.o. [name] [name] Primary Care Keagan Brislin: SYSTEM, PCP Other Clinician: Referring Traves Majchrzak: Treating Sadiyah Kangas/Extender: RO BSO N, [name] EL [name] [name] in Treatment: 1 Wound Status [name] [name], [name] [name] ([id number]) [id number] Page 7 of 9 Wound Number: 1 Primary Etiology: Diabetic Wound/Ulcer of the Lower Extremity Wound Location: Left, Anterior T Second oe Wound Status: Open Wounding Event: Pressure Injury Comorbid Peripheral Venous Disease, Type II Diabetes, History: Osteomyelitis Date Acquired: 10/12/2022 [name] Of Treatment: 1 Clustered Wound: No Pending Amputation On Presentation Photos Wound Measurements Length: (cm) 1.1 Width: (cm) 1 Depth: (cm) 0.3 Area: (cm) 0.864 Volume: (cm) 0.259 % Reduction in Area: 9.1% % Reduction in Volume: 45.5% Undermining: Yes Starting Position (o'clock): 3 Ending Position (o'clock): 4 Maximum Distance: (cm) 0.7 Wound Description Classification: Grade 2 Exudate Amount: Medium Exudate Type: Serosanguineous Exudate Color: red, brown Foul Odor After Cleansing: No Slough/Fibrino Yes Wound Bed Granulation Amount: Medium (34-66%) Exposed Structure Granulation Quality: Red, Pink Fascia Exposed: No Necrotic Amount: Small (1-33%) Fat Layer (Subcutaneous Tissue) Exposed:  No Necrotic Quality: Adherent Slough Tendon Exposed: No Muscle Exposed: No Joint Exposed: Yes Bone Exposed: Yes Treatment Notes Wound #1 (Toe Second) Wound Laterality: Left, Anterior Cleanser Soap and Water Discharge Instruction: Gently cleanse wound with antibacterial soap, rinse and pat dry prior to dressing wounds Peri-Wound Care Topical Primary Dressing Prisma 4.34 (in) Discharge Instruction: Moisten w/normal saline or sterile water; Cover wound as directed. Do not remove from wound bed. Secondary Dressing Gauze Discharge Instruction: As directed: dry, moistened with saline or moistened with Dakins Solution Secured With Bradley H Soft Cloth Surgical T ape ape, 2x2 (in/yd) Compression Wrap Compression Stockings Add-Ons [name] [name], [name] [name] ([id number])  [id number] Page 8 of 9 Electronic Signature(s) Signed: 11/02/2022 5:13:55 PM By: Rosalio Loud MSN RN CNS WTA Entered By: Rosalio Loud on 11/02/2022 14:03:29 -------------------------------------------------------------------------------- Wound Assessment Details Patient Name: Date of Service: [name] [name] 11/02/2022 1:15 PM Medical Record Number: [id number] Patient Account Number: [id number] Date of Birth/Sex: Treating RN: 08/29/1960 (62 y.o. [name] [name] Primary Care Roselynn Whitacre: SYSTEM, PCP Other Clinician: Referring Tamrah Victorino: Treating Gudelia Eugene/Extender: RO BSO N, [name] EL [name] [name] in Treatment: 1 Wound Status Wound Number: 2 Primary Etiology: Diabetic Wound/Ulcer of the Lower Extremity Wound Location: Right, Dorsal Foot Wound Status: Open Wounding Event: Footwear Injury Comorbid Peripheral Venous Disease, Type II Diabetes, History: Osteomyelitis Date Acquired: 10/12/2022 [name] Of Treatment: 1 Clustered Wound: No Photos Wound Measurements Length: (cm) 0.4 Width: (cm) 1.2 Depth: (cm) 0.1 Area: (cm) 0.377 Volume: (cm) 0.038 % Reduction in Area: 84.3% % Reduction in Volume: 84.2% Epithelialization: Small (1-33%) Wound Description Classification: Grade 1 Exudate Amount: Medium Exudate Type: Serosanguineous Exudate Color: red, brown Foul Odor After Cleansing: No Slough/Fibrino Yes Wound Bed Granulation Amount: Small (1-33%) Exposed Structure Granulation Quality: Red, Pink Fascia Exposed: No Necrotic Amount: Medium (34-66%) Fat Layer (Subcutaneous Tissue) Exposed: Yes Necrotic Quality: Adherent Slough Tendon Exposed: No Muscle Exposed: No Joint Exposed: No Bone Exposed: No Treatment Notes [name] [name], [name] [name] ([id number]) [id number] Page 9 of 9 Wound #2 (Foot) Wound Laterality: Dorsal, Right Cleanser Soap and Water Discharge Instruction: Gently cleanse wound with antibacterial soap, rinse and pat  dry prior to dressing wounds Peri-Wound Care Topical Primary Dressing Prisma 4.34 (in) Discharge Instruction: Moisten w/normal saline or sterile water; Cover wound as directed. Do not remove from wound bed. Secondary Dressing Zetuvit Plus 4x4 (in/in) Secured With Compression Wrap Compression Stockings Add-Ons Electronic Signature(s) Signed: 11/02/2022 5:13:55 PM By: Rosalio Loud MSN RN CNS WTA Entered By: Rosalio Loud on 11/02/2022 14:01:47 -------------------------------------------------------------------------------- Vitals Details Patient Name: Date of Service: [name] [name] 11/02/2022 1:15 PM Medical Record Number: [id number] Patient Account Number: [id number] Date of Birth/Sex: Treating RN: Aug 21, 1961 (62 y.o. [name] [name] Primary Care Babbie Dondlinger: SYSTEM, PCP Other Clinician: Referring Delmon Andrada: Treating Celestia Duva/Extender: RO BSO N, [name] EL [name] [name] in Treatment: 1 Vital Signs Time Taken: 14:47 Temperature (F): 98.0 Height (in): 76 Pulse (bpm): 84 Weight (lbs): 232 Respiratory Rate (breaths/min): 16 Body Mass Index (BMI): 28.2 Blood Pressure (mmHg): 137/87 Reference Range: 80 - 120 mg / dl Electronic Signature(s) Signed: 11/02/2022 5:13:55 PM By: Rosalio Loud MSN RN CNS WTA Entered By: Rosalio Loud on 11/02/2022 13:48:45

## 2022-11-16 ENCOUNTER — Ambulatory Visit: Payer: Medicaid Other | Admitting: Physician Assistant

## 2022-11-23 ENCOUNTER — Ambulatory Visit: Payer: Medicaid Other | Admitting: Physician Assistant

## 2022-11-24 ENCOUNTER — Encounter: Payer: Medicare Other | Admitting: Physician Assistant

## 2022-11-24 ENCOUNTER — Ambulatory Visit
Admission: RE | Admit: 2022-11-24 | Discharge: 2022-11-24 | Disposition: A | Discharge status: Home / Self Care | Payer: Medicare Other | Source: Ambulatory Visit | Attending: Physician Assistant | Admitting: Physician Assistant

## 2022-11-24 ENCOUNTER — Other Ambulatory Visit: Payer: Self-pay | Admitting: Physician Assistant

## 2022-11-24 DIAGNOSIS — S81802A Unspecified open wound, left lower leg, initial encounter: Secondary | ICD-10-CM | POA: Insufficient documentation

## 2022-11-24 DIAGNOSIS — E11621 Type 2 diabetes mellitus with foot ulcer: Secondary | ICD-10-CM | POA: Diagnosis not present

## 2022-11-24 NOTE — Progress Notes (Addendum)
EBAN, BONKOWSKI ([id number]) [id number] Page 1 of 9 Visit Report for 11/24/2022 Arrival Information Details Patient Name: Date of Service: [name] [name], [name] [name] 11/24/2022 8:45 A M Medical Record Number: [id number] Patient Account Number: [id number] Date of Birth/Sex: Treating RN: 28-Mar-1961 (62 y.o. [name] [name]) Carlene Coria Primary Care Tyteanna Ost: SYSTEM, PCP Other Clinician: Referring Tasmine Hipwell: Treating Mitchel Delduca/Extender: Audry Pili in Treatment: 4 Visit Information History Since Last Visit Added or deleted any medications: No Patient Arrived: Ambulatory Any new allergies or adverse reactions: No Arrival Time: 08:59 Had a fall or experienced change in No Accompanied By: self activities of daily living that may affect Transfer Assistance: None risk of falls: Patient Identification Verified: Yes Signs or symptoms of abuse/neglect since last visito No Secondary Verification Process Completed: Yes Hospitalized since last visit: No Patient Requires Transmission-Based Precautions: No Implantable device outside of the clinic excluding No Patient Has Alerts: Yes cellular tissue based products placed in the center Patient Alerts: Diabetes Type 2 since last visit: R ABI 1.03 L 1.10 Has Dressing in Place as Prescribed: Yes Pain Present Now: No Electronic Signature(s) Signed: 11/24/2022 1:09:18 PM By: Carlene Coria RN Entered By: Carlene Coria on 11/24/2022 09:00:19 -------------------------------------------------------------------------------- Clinic Level of Care Assessment Details Patient Name: Date of Service: [name] [name] 11/24/2022 8:45 A M Medical Record Number: [id number] Patient Account Number: [id number] Date of Birth/Sex: Treating RN: 11-08-1960 (62 y.o. [name] [name]) Carlene Coria Primary Care Sharday Michl: SYSTEM, PCP Other Clinician: Referring Suad Autrey: Treating Trini Soldo/Extender: Audry Pili in Treatment: 4 Clinic Level  of Care Assessment Items TOOL 4 Quantity Score X- 1 0 Use when only an EandM is performed on FOLLOW-UP visit ASSESSMENTS - Nursing Assessment / Reassessment X- 1 10 Reassessment of Co-morbidities (includes updates in patient status) X- 1 5 Reassessment of Adherence to Treatment Plan ASSESSMENTS - Wound and Skin A ssessment / Reassessment []  - 0 Simple Wound Assessment / Reassessment - one wound X- 2 5 Complex Wound Assessment / Reassessment - multiple wounds []  - 0 Dermatologic / Skin Assessment (not related to wound area) ASSESSMENTS - Focused Assessment []  - 0 Circumferential Edema Measurements - multi extremities []  - 0 Nutritional Assessment / Counseling / Intervention []  - 0 Lower Extremity Assessment (monofilament, tuning fork, pulses) []  - 0 Peripheral Arterial Disease Assessment (using hand held doppler) ASSESSMENTS - Ostomy and/or Continence Assessment and Care []  - 0 Incontinence Assessment and Management []  - 0 Ostomy Care Assessment and Management (repouching, etc.) PROCESS - Coordination of Care X - Simple Patient / Family Education for ongoing care 1 15 BLANDON, WATRY ([id number]) [id number] Page 2 of 9 []  - 0 Complex (extensive) Patient / Family Education for ongoing care []  - 0 Staff obtains Programmer, systems, Records, T Results / Process Orders est []  - 0 Staff telephones HHA, Nursing Homes / Clarify orders / etc []  - 0 Routine Transfer to another Facility (non-emergent condition) []  - 0 Routine Hospital Admission (non-emergent condition) []  - 0 New Admissions / Biomedical engineer / Ordering NPWT Apligraf, etc. , []  - 0 Emergency Hospital Admission (emergent condition) X- 1 10 Simple Discharge Coordination []  - 0 Complex (extensive) Discharge Coordination PROCESS - Special Needs []  - 0 Pediatric / Minor Patient Management []  - 0 Isolation Patient Management []  - 0 Hearing / Language / Visual special needs []  -  0 Assessment of Community assistance (transportation, D/C planning, etc.) []  - 0 Additional assistance / Altered mentation []  - 0 Support Surface(s) Assessment (bed, cushion, seat, etc.) INTERVENTIONS -  Wound Cleansing / Measurement []  - 0 Simple Wound Cleansing - one wound X- 2 5 Complex Wound Cleansing - multiple wounds X- 1 5 Wound Imaging (photographs - any number of wounds) []  - 0 Wound Tracing (instead of photographs) []  - 0 Simple Wound Measurement - one wound X- 2 5 Complex Wound Measurement - multiple wounds INTERVENTIONS - Wound Dressings X - Small Wound Dressing one or multiple wounds 2 10 []  - 0 Medium Wound Dressing one or multiple wounds []  - 0 Large Wound Dressing one or multiple wounds []  - 0 Application of Medications - topical []  - 0 Application of Medications - injection INTERVENTIONS - Miscellaneous []  - 0 External ear exam []  - 0 Specimen Collection (cultures, biopsies, blood, body fluids, etc.) []  - 0 Specimen(s) / Culture(s) sent or taken to Lab for analysis []  - 0 Patient Transfer (multiple staff / Civil Service fast streamer / Similar devices) []  - 0 Simple Staple / Suture removal (25 or less) []  - 0 Complex Staple / Suture removal (26 or more) []  - 0 Hypo / Hyperglycemic Management (close monitor of Blood Glucose) []  - 0 Ankle / Brachial Index (ABI) - do not check if billed separately []  - 0 Vital Signs Has the patient been seen at the hospital within the last three years: Yes Total Score: 95 Level Of Care: New/Established - Level 3 Electronic Signature(s) Signed: 11/24/2022 1:09:18 PM By: Carlene Coria RN Entered By: Carlene Coria on 11/24/2022 09:37:08 Edwyna Perfect ([id number]) [id number] Page 3 of 9 -------------------------------------------------------------------------------- Encounter Discharge Information Details Patient Name: Date of Service: [name] [name] 11/24/2022 8:45 A M Medical Record Number:  [id number] Patient Account Number: [id number] Date of Birth/Sex: Treating RN: 1960/09/24 (62 y.o. [name] [name]) Carlene Coria Primary Care Annora Guderian: SYSTEM, PCP Other Clinician: Referring Dellas Guard: Treating Dystany Duffy/Extender: Audry Pili in Treatment: 4 Encounter Discharge Information Items Discharge Condition: Stable Ambulatory Status: Ambulatory Discharge Destination: Home Transportation: Private Auto Accompanied By: self Schedule Follow-up Appointment: Yes Clinical Summary of Care: Electronic Signature(s) Signed: 11/24/2022 9:37:50 AM By: Carlene Coria RN Entered By: Carlene Coria on 11/24/2022 09:37:49 -------------------------------------------------------------------------------- Lower Extremity Assessment Details Patient Name: Date of Service: FERRIS, VALEN 11/24/2022 8:45 A M Medical Record Number: [id number] Patient Account Number: [id number] Date of Birth/Sex: Treating RN: 03/31/1961 (62 y.o. [name] [name]) Carlene Coria Primary Care Hooria Gasparini: SYSTEM, PCP Other Clinician: Referring Jora Galluzzo: Treating Squire Withey/Extender: Kerin Perna Weeks in Treatment: 4 Vascular Assessment Pulses: Dorsalis Pedis Palpable: [Left:Yes] [Right:Yes] Electronic Signature(s) Signed: 11/24/2022 1:09:18 PM By: Carlene Coria RN Entered By: Carlene Coria on 11/24/2022 09:06:44 -------------------------------------------------------------------------------- Multi Wound Chart Details Patient Name: Date of Service: [name] 11/24/2022 8:45 A M Medical Record Number: [id number] Patient Account Number: [id number] Date of Birth/Sex: Treating RN: [birth year] (62 y.o. [name] [name]) Carlene Coria Primary Care Laquanta Hummel: SYSTEM, PCP Other Clinician: Referring Mckinley Olheiser: Treating Shauniece Kwan/Extender: Audry Pili in Treatment: 4 Vital Signs Height(in): 76 Pulse(bpm): 108 Weight(lbs): 232 Blood Pressure(mmHg): 146/90 Body Mass Index(BMI): 28.2 Temperature(F):  98.4 Respiratory Rate(breaths/min): 18 [1:Photos:] [N/A:N/A [id number] Page 4 of 9] Left, Anterior T Second oe Right, Dorsal Foot N/A Wound Location: Pressure Injury Footwear Injury N/A Wounding Event: Diabetic Wound/Ulcer of the Lower Diabetic Wound/Ulcer of the Lower N/A Primary Etiology: Extremity Extremity Peripheral Venous Disease, Type II Peripheral Venous Disease, Type II N/A Comorbid History: Diabetes, Osteomyelitis Diabetes, Osteomyelitis 10/12/2022 10/12/2022 N/A Date Acquired: 4 4 N/A Weeks of Treatment: Open Open N/A Wound Status: No No N/A Wound Recurrence: Yes No N/A  Pending A mputation on Presentation: 1.1x1x0.6 0.4x0.5x0.1 N/A Measurements L x W x D (cm) 0.864 0.157 N/A A (cm) : rea 0.518 0.016 N/A Volume (cm) : 9.10% 93.50% N/A % Reduction in A rea: -9.10% 93.30% N/A % Reduction in Volume: Grade 2 Grade 1 N/A Classification: Large Large N/A Exudate A mount: Serosanguineous Serosanguineous N/A Exudate Type: red, brown red, brown N/A Exudate Color: Medium (34-66%) Medium (34-66%) N/A Granulation A mount: Red, Pink Red, Pink N/A Granulation Quality: Medium (34-66%) Medium (34-66%) N/A Necrotic A mount: Joint: Yes Fat Layer (Subcutaneous Tissue): Yes N/A Exposed Structures: Bone: Yes Fascia: No Fascia: No Tendon: No Fat Layer (Subcutaneous Tissue): No Muscle: No Tendon: No Joint: No Muscle: No Bone: No None Small (1-33%) N/A Epithelialization: Treatment Notes Electronic Signature(s) Signed: 11/24/2022 1:09:18 PM By: Carlene Coria RN Entered By: Carlene Coria on 11/24/2022 09:06:48 -------------------------------------------------------------------------------- Multi-Disciplinary Care Plan Details Patient Name: Date of Service: Crafton, [place] L. 11/24/2022 8:45 A M Medical Record Number: [id number] Patient Account Number: [id number] Date of Birth/Sex: Treating RN: February 06, 1961 (62 y.o. [name] [name]) Carlene Coria Primary  Care Lamae Fosco: SYSTEM, PCP Other Clinician: Referring Habeeb Puertas: Treating Marcella Dunnaway/Extender: Audry Pili in Treatment: 4 Active Inactive Necrotic Tissue Nursing Diagnoses: Impaired tissue integrity related to necrotic/devitalized tissue Knowledge deficit related to management of necrotic/devitalized tissue Goals: Necrotic/devitalized tissue will be minimized in the wound bed Date Initiated: 10/26/2022 Target Resolution Date: 12/25/2022 Goal Status: Active Patient/caregiver will verbalize understanding of reason and process for debridement of necrotic tissue Date Initiated: 10/26/2022 Date Inactivated: 11/24/2022 Target Resolution Date: 11/24/2022 Goal Status: Met Interventions: Assess patient pain level pre-, during and post procedure and prior to discharge Provide education on necrotic tissue and debridement process MOUNIR, GORES ([id number]) [id number] Page 5 of 9 Treatment Activities: Excisional debridement : 10/26/2022 T ordered outside of clinic : 10/26/2022 est Notes: Wound/Skin Impairment Nursing Diagnoses: Impaired tissue integrity Knowledge deficit related to smoking impact on wound healing Knowledge deficit related to ulceration/compromised skin integrity Goals: Patient will demonstrate a reduced rate of smoking or cessation of smoking Date Initiated: 10/26/2022 Date Inactivated: 11/24/2022 Target Resolution Date: 11/24/2022 Goal Status: Met Patient will have a decrease in wound volume by X% from date: (specify in notes) Date Initiated: 10/26/2022 Target Resolution Date: 11/24/2022 Goal Status: Active Patient/caregiver will verbalize understanding of skin care regimen Date Initiated: 10/26/2022 Target Resolution Date: 12/25/2022 Goal Status: Active Ulcer/skin breakdown will have a volume reduction of 30% by week 4 Date Initiated: 10/26/2022 Date Inactivated: 11/24/2022 Target Resolution Date: 11/24/2022 Goal Status:  Unmet Unmet Reason: comoridities Ulcer/skin breakdown will have a volume reduction of 50% by week 8 Date Initiated: 10/26/2022 Target Resolution Date: 12/25/2022 Goal Status: Active Ulcer/skin breakdown will have a volume reduction of 80% by week 12 Date Initiated: 10/26/2022 Target Resolution Date: 01/24/2023 Goal Status: Active Ulcer/skin breakdown will heal within 14 weeks Date Initiated: 10/26/2022 Target Resolution Date: 02/07/2023 Goal Status: Active Interventions: Assess patient/caregiver ability to obtain necessary supplies Assess patient/caregiver ability to perform ulcer/skin care regimen upon admission and as needed Assess ulceration(s) every visit Provide education on smoking Provide education on ulcer and skin care Treatment Activities: Skin care regimen initiated : 10/26/2022 Smoking cessation education : 10/26/2022 Notes: Electronic Signature(s) Signed: 11/24/2022 1:09:18 PM By: Carlene Coria RN Entered By: Carlene Coria on 11/24/2022 09:08:01 -------------------------------------------------------------------------------- Pain Assessment Details Patient Name: Date of Service: JERRED, BASTONE RNEY Carlean Jews 11/24/2022 8:45 A M Medical Record Number: [id number] Patient Account Number: [id number] Date of Birth/Sex: Treating RN: 1961/03/17 (62 y.o. M)  Carlene Coria Primary Care Ivelisse Culverhouse: SYSTEM, PCP Other Clinician: Referring Krisi Azua: Treating Jodell Weitman/Extender: Audry Pili in Treatment: 4 Active Problems Location of Pain Severity and Description of Pain Patient Has Paino No Site Locations Kendrick L ([id number]) [id number] Page 6 of 9 Pain Management and Medication Current Pain Management: Electronic Signature(s) Signed: 11/24/2022 1:09:18 PM By: Carlene Coria RN Entered By: Carlene Coria on 11/24/2022 09:00:44 -------------------------------------------------------------------------------- Patient/Caregiver Education  Details Patient Name: Date of Service: Nehemiah Settle 3/29/2024andnbsp8:45 [place] Record Number: [id number] Patient Account Number: [id number] Date of Birth/Gender: Treating RN: 1961/07/29 (62 y.o. [name] [name]) Carlene Coria Primary Care Physician: SYSTEM, PCP Other Clinician: Referring Physician: Treating Physician/Extender: Audry Pili in Treatment: 4 Education Assessment Education Provided To: Patient Education Topics Provided Smoking and Wound Healing: Handouts: Smoking and Wound Healing Methods: Explain/Verbal Responses: State content correctly Wound Debridement: Handouts: Wound Debridement Methods: Explain/Verbal Responses: State content correctly Electronic Signature(s) Signed: 11/24/2022 1:09:18 PM By: Carlene Coria RN Entered By: Carlene Coria on 11/24/2022 09:08:29 -------------------------------------------------------------------------------- Wound Assessment Details Patient Name: Date of Service: [name] 11/24/2022 8:45 A M Medical Record Number: [id number] Patient Account Number: [id number] Date of Birth/Sex: Treating RN: 08-09-1961 (62 y.o. Oval Linsey Primary Care Tyaire Odem: SYSTEM, PCP Other Clinician: Referring Carolle Ishii: Treating Ranson Belluomini/Extender: Audry Pili in Treatment: 4 Wound Status Edwyna Perfect ([id number]) [id number] Page 7 of 9 Wound Number: 1 Primary Etiology: Diabetic Wound/Ulcer of the Lower Extremity Wound Location: Left, Anterior T Second oe Wound Status: Open Wounding Event: Pressure Injury Comorbid Peripheral Venous Disease, Type II Diabetes, History: Osteomyelitis Date Acquired: 10/12/2022 Weeks Of Treatment: 4 Clustered Wound: No Pending Amputation On Presentation Photos Wound Measurements Length: (cm) 1.1 Width: (cm) 1 Depth: (cm) 0.6 Area: (cm) 0.864 Volume: (cm) 0.518 % Reduction in Area: 9.1% % Reduction in Volume:  -9.1% Epithelialization: None Tunneling: No Undermining: No Wound Description Classification: Grade 2 Exudate Amount: Large Exudate Type: Serosanguineous Exudate Color: red, brown Foul Odor After Cleansing: No Slough/Fibrino Yes Wound Bed Granulation Amount: Medium (34-66%) Exposed Structure Granulation Quality: Red, Pink Fascia Exposed: No Necrotic Amount: Medium (34-66%) Fat Layer (Subcutaneous Tissue) Exposed: No Necrotic Quality: Adherent Slough Tendon Exposed: No Muscle Exposed: No Joint Exposed: Yes Bone Exposed: Yes Treatment Notes Wound #1 (Toe Second) Wound Laterality: Left, Anterior Cleanser Soap and Water Discharge Instruction: Gently cleanse wound with antibacterial soap, rinse and pat dry prior to dressing wounds Peri-Wound Care Topical Primary Dressing Prisma 4.34 (in) Discharge Instruction: Moisten w/normal saline or sterile water; Cover wound as directed. Do not remove from wound bed. Silvercel Small 2x2 (in/in) Discharge Instruction: Apply Silvercel Small 2x2 (in/in) as instructed Secondary Dressing Coverlet Latex-Free Fabric Adhesive Dressings Discharge Instruction: 1.5 x 2 Gauze Secured With Compression Wrap Compression Stockings Add-Ons YERAY, BOLDON ([id number]) [id number] Page 8 of 9 Electronic Signature(s) Signed: 11/24/2022 1:09:18 PM By: Carlene Coria RN Entered By: Carlene Coria on 11/24/2022 09:06:10 -------------------------------------------------------------------------------- Wound Assessment Details Patient Name: Date of Service: YISHAY, SCHIANO 11/24/2022 8:45 A M Medical Record Number: [id number] Patient Account Number: [id number] Date of Birth/Sex: Treating RN: December 06, 1960 (62 y.o. [name] [name]) Carlene Coria Primary Care Kambree Krauss: SYSTEM, PCP Other Clinician: Referring Allanah Mcfarland: Treating Christyan Reger/Extender: Audry Pili in Treatment: 4 Wound Status Wound Number: 2 Primary Etiology:  Diabetic Wound/Ulcer of the Lower Extremity Wound Location: Right, Dorsal Foot Wound Status: Open Wounding Event: Footwear Injury Comorbid Peripheral Venous Disease, Type II Diabetes, History: Osteomyelitis Date Acquired: 10/12/2022 Weeks Of Treatment:  4 Clustered Wound: No Photos Wound Measurements Length: (cm) 0.4 Width: (cm) 0.5 Depth: (cm) 0.1 Area: (cm) 0.157 Volume: (cm) 0.016 % Reduction in Area: 93.5% % Reduction in Volume: 93.3% Epithelialization: Small (1-33%) Tunneling: No Undermining: No Wound Description Classification: Grade 1 Exudate Amount: Large Exudate Type: Serosanguineous Exudate Color: red, brown Foul Odor After Cleansing: No Slough/Fibrino Yes Wound Bed Granulation Amount: Medium (34-66%) Exposed Structure Granulation Quality: Red, Pink Fascia Exposed: No Necrotic Amount: Medium (34-66%) Fat Layer (Subcutaneous Tissue) Exposed: Yes Necrotic Quality: Adherent Slough Tendon Exposed: No Muscle Exposed: No Joint Exposed: No Bone Exposed: No Treatment Notes Wound #2 (Foot) Wound Laterality: Dorsal, Right Cleanser Soap and Water Discharge Instruction: Gently cleanse wound with antibacterial soap, rinse and pat dry prior to dressing wounds Peri-Wound Care Topical Primary Dressing Silvercel Small 2x2 (in/in) Lodico, Shae L ([id number]) [id number] Page 9 of 9 Discharge Instruction: Apply Silvercel Small 2x2 (in/in) as instructed Secondary Dressing Coverlet Latex-Free Fabric Adhesive Dressings Discharge Instruction: 1.5 x 2 Secured With Compression Wrap Compression Stockings Add-Ons Electronic Signature(s) Signed: 11/24/2022 1:09:18 PM By: Carlene Coria RN Entered By: Carlene Coria on 11/24/2022 09:06:34 -------------------------------------------------------------------------------- Vitals Details Patient Name: Date of Service: [name] 11/24/2022 8:45 A M Medical Record Number: [id number] Patient Account  Number: [id number] Date of Birth/Sex: Treating RN: 1960/09/27 (62 y.o. [name] [name]) Carlene Coria Primary Care Jesenya Bowditch: SYSTEM, PCP Other Clinician: Referring Elizabeth Paulsen: Treating Alize Acy/Extender: Audry Pili in Treatment: 4 Vital Signs Time Taken: 09:00 Temperature (F): 98.4 Height (in): 76 Pulse (bpm): 108 Weight (lbs): 232 Respiratory Rate (breaths/min): 18 Body Mass Index (BMI): 28.2 Blood Pressure (mmHg): 146/90 Reference Range: 80 - 120 mg / dl Electronic Signature(s) Signed: 11/24/2022 1:09:18 PM By: Carlene Coria RN Entered By: Carlene Coria on 11/24/2022 09:00:35

## 2022-11-24 NOTE — Progress Notes (Addendum)
TEDD, BACHMANN ([id number]) [id number] Page 1 of 6 Visit Report for 11/24/2022 Chief Complaint Document Details Patient Name: Date of Service: [name] [name], [name] [name] 11/24/2022 8:45 A M Medical Record Number: [id number] Patient Account Number: [id number] Date of Birth/Sex: Treating RN: 1961-03-21 (62 y.o. [name] [name]) [name] [name] Primary Care Provider: SYSTEM, PCP Other Clinician: Referring Provider: Treating Provider/Extender: Audry Pili in Treatment: 4 Information Obtained from: Patient Chief Complaint Bilateral Foot Ulcers Electronic Signature(s) Signed: 11/24/2022 8:46:48 AM By: Worthy Keeler PA-C Entered By: Worthy Keeler on 11/24/2022 08:46:48 -------------------------------------------------------------------------------- HPI Details Patient Name: Date of Service: [name] Harps RNEY [name] 11/24/2022 8:45 A M Medical Record Number: [id number] Patient Account Number: [id number] Date of Birth/Sex: Treating RN: Jan 05, 1961 (62 y.o. [name] [name]) [name] [name] Primary Care Provider: SYSTEM, PCP Other Clinician: Referring Provider: Treating Provider/Extender: Audry Pili in Treatment: 4 History of Present Illness HPI Description: 10-26-2022 upon evaluation today patient presents for initial inspection here in our clinic concerning issues that he has been having with wounds over the right dorsal foot and left distal toe on the dorsal surface more so. With that being said he has had an MRI of the right foot which showed no signs of osteomyelitis I see no x-rays nor MRI of the left foot at all at this point. Nonetheless the left foot actually is the one that appears there could be evidence of infection in the toe that is the 1 I am most concerned about. He is also going to require debridement of both locations I discussed that with him today. The patient's MRI was actually on 10-22-2022 which showed cellulitis of the right foot but no  osteomyelitis. Has been on cefadroxil which was prescribed on 10-25-2022. With that being said it is appearing to me that the patient likely has a infection in regard to the second toe left foot where his other wound is and this is the one that has been most concerned. He actually has bone exposed I am going to perform debridement I want to obtain samples as well to send for pathology and culture. Patient has a past medical history which is significant for diabetes mellitus type 2 with his most recent hemoglobin A1c being 5.1. 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 He also has amputation of the right second toe. The patient also has significant past medical history for chronic venous hypertension. Patient's ABIs were performed on 10-07-2022 showed a right ABI of 1.03 in the left ABI of 1.10. 3/7; this was a patient who is new to our clinic last week. He has a wound on his left second toe anteriorly and the right dorsal foot. Earlier this week I received the culture report and organism responsible for the infection. I was not even sure how did identify this. The bone culture from last week showed osteomyelitis on the left second toe.. The area on the right dorsal foot looks a lot better no evidence of infection 11-24-2022 upon evaluation today patient appears to be doing poorly in regard to his left second toe although the first toe right foot actually looks better in my opinion. I did review his pathology and culture as well and it showed bone necrosis but no definitive osteomyelitis. The culture nonetheless did show with the bone there was bacteria isolated. He has been on the Bactrim although that was for 2 weeks from Dr. Dellia Nims this is the  first time I am seeing him since that point. Honestly I am not certain if we should continue that or if he needs something different I did actually order an x-ray unfortunately there was confusion and  in the end we did not end up getting the x-ray. This was on the left foot and interned it was presumed that this was done but what was actually sent which is the x-ray of the right foot which is not what we needed. He has had an x-ray and MRI of the right foot neither of the left foot. For that reason I would have to send him today for the left foot x-ray. Electronic Signature(s) Signed: 11/24/2022 11:58:34 AM By: Worthy Keeler PA-C Entered By: Worthy Keeler on 11/24/2022 11:58:33 Physical Exam Details -------------------------------------------------------------------------------- [name] [name] ([id number]) [id number] Page 2 of 6 Patient Name: Date of Service: [name] [name], [name] [name] 11/24/2022 8:45 A M Medical Record Number: [id number] Patient Account Number: [id number] Date of Birth/Sex: Treating RN: [birth year] (62 y.o. [name] [name]) [name] [name] Primary Care Provider: SYSTEM, PCP Other Clinician: Referring Provider: Treating Provider/Extender: Audry Pili in Treatment: 4 Constitutional Well-nourished and well-hydrated in no acute distress. Respiratory normal breathing without difficulty. Psychiatric this patient is able to make decisions and demonstrates good insight into disease process. Alert and Oriented x 3. pleasant and cooperative. Notes Upon inspection patient's wound bed actually showed signs of good granulation in the right great toe location. The left second toe however does not appear to be doing nearly as good in fact not even sure there is not a piece of bone completely separated from the bone exposed in the tip of the toe I am thinking that he may end up needing toe amputation but again I really need to see an x-ray to see what exactly is going on here. The patient is going to go for that x-ray today. Soon as I get that report back I will see what it says and then we will make discussion on plans of where to go next. Electronic  Signature(s) Signed: 11/24/2022 11:59:14 AM By: Worthy Keeler PA-C Entered By: Worthy Keeler on 11/24/2022 11:59:13 -------------------------------------------------------------------------------- Physician Orders Details Patient Name: Date of Service: [name] [name], [name] [name] 11/24/2022 8:45 A M Medical Record Number: [id number] Patient Account Number: [id number] Date of Birth/Sex: Treating RN: [birth year] (62 y.o. [name] [name]) [name] [name] Primary Care Provider: SYSTEM, PCP Other Clinician: Referring Provider: Treating Provider/Extender: Audry Pili in Treatment: 4 Verbal / Phone Orders: No Diagnosis Coding ICD-10 Coding Code Description E11.621 Type 2 diabetes mellitus with foot ulcer L97.524 Non-pressure chronic ulcer of other part of left foot with necrosis of bone L97.512 Non-pressure chronic ulcer of other part of right foot with fat layer exposed M14.672 Charcot's joint, left ankle and foot 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 I87.323 Chronic venous hypertension (idiopathic) with inflammation of bilateral lower extremity F17.218 Nicotine dependence, cigarettes, with other nicotine-induced disorders Follow-up Appointments Return Appointment in 1 week. Bathing/ Shower/ Hygiene May shower; gently cleanse wound with antibacterial soap, rinse and pat dry prior to dressing wounds Anesthetic (Use 'Patient Medications' Section for Anesthetic Order Entry) Lidocaine applied to wound bed Edema Control - Lymphedema / Segmental Compressive Device / Other Elevate, Exercise Daily and A void Standing for Long Periods of Time. Elevate legs to the level of the heart and pump ankles as often as possible Elevate leg(s) parallel to the floor when sitting. Additional Orders / Instructions Decrease/Stop Smoking Wound Treatment  Wound #1 - T Second oe Wound Laterality: Left, Anterior Cleanser: Soap and Water 3 x Per Week/30 Days Discharge Instructions: Gently cleanse wound with  antibacterial soap, rinse and pat dry prior to dressing wounds LANDY, BULGER ([id number]) [id number] Page 3 of 6 Prim Dressing: Silvercel Small 2x2 (in/in) 3 x Per Week/30 Days ary Discharge Instructions: Apply Silvercel Small 2x2 (in/in) as instructed Secondary Dressing: Gauze 3 x Per Week/30 Days Secured With: Medipore T - 51M Medipore H Soft Cloth Surgical T ape ape, 2x2 (in/yd) 3 x Per Week/30 Days Wound #2 - Foot Wound Laterality: Dorsal, Right Cleanser: Soap and Water 3 x Per Week/30 Days Discharge Instructions: Gently cleanse wound with antibacterial soap, rinse and pat dry prior to dressing wounds Prim Dressing: Silvercel Small 2x2 (in/in) 3 x Per Week/30 Days ary Discharge Instructions: Apply Silvercel Small 2x2 (in/in) as instructed Secondary Dressing: Coverlet Latex-Free Fabric Adhesive Dressings 3 x Per Week/30 Days Discharge Instructions: 1.5 x 2 Radiology X-ray, foot left - non healing wound with increased drainage - (ICD10 E11.621 - Type 2 diabetes mellitus with foot ulcer) Electronic Signature(s) Signed: 11/24/2022 1:09:18 PM By: [name] Coria RN Signed: 11/24/2022 2:14:36 PM By: Worthy Keeler PA-C Entered By: [name] [name] on 11/24/2022 09:43:30 -------------------------------------------------------------------------------- Problem List Details Patient Name: Date of Service: [name] Harps RNEY [name] 11/24/2022 8:45 A M Medical Record Number: [id number] Patient Account Number: [id number] Date of Birth/Sex: Treating RN: 1961-07-01 (62 y.o. [name] [name]) [name] [name] Primary Care Provider: SYSTEM, PCP Other Clinician: Referring Provider: Treating Provider/Extender: Audry Pili in Treatment: 4 Active Problems ICD-10 Encounter Code Description Active Date MDM Diagnosis E11.621 Type 2 diabetes mellitus with foot ulcer 10/26/2022 No Yes L97.524 Non-pressure chronic ulcer of other part of left foot with necrosis of bone 10/26/2022 No  Yes L97.512 Non-pressure chronic ulcer of other part of right foot with fat layer exposed 10/26/2022 No Yes M14.672 Charcot's joint, left ankle and foot 10/26/2022 No Yes 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 10/26/2022 No Yes I87.323 Chronic venous hypertension (idiopathic) with inflammation of bilateral lower 10/26/2022 No Yes extremity F17.218 Nicotine dependence, cigarettes, with other nicotine-induced disorders 10/26/2022 No Yes [name] [name], [name] [name] ([id number]) [id number] Page 4 of 6 Inactive Problems Resolved Problems Electronic Signature(s) Signed: 11/24/2022 8:46:45 AM By: Worthy Keeler PA-C Entered By: Worthy Keeler on 11/24/2022 08:46:45 -------------------------------------------------------------------------------- Progress Note Details Patient Name: Date of Service: [name] Harps RNEY [name] 11/24/2022 8:45 A M Medical Record Number: [id number] Patient Account Number: [id number] Date of Birth/Sex: Treating RN: 20-Apr-1961 (62 y.o. [name] [name]) [name] [name] Primary Care Provider: SYSTEM, PCP Other Clinician: Referring Provider: Treating Provider/Extender: Audry Pili in Treatment: 4 Subjective Chief Complaint Information obtained from Patient Bilateral Foot Ulcers History of Present Illness (HPI) 10-26-2022 upon evaluation today patient presents for initial inspection here in our clinic concerning issues that he has been having with wounds over the right dorsal foot and left distal toe on the dorsal surface more so. With that being said he has had an MRI of the right foot which showed no signs of osteomyelitis I see no x-rays nor MRI of the left foot at all at this point. Nonetheless the left foot actually is the one that appears there could be evidence of infection in the toe that is the 1 I am most concerned about. He is also going to require debridement of both locations I discussed that with him today. The patient's MRI was actually on  10-22-2022 which showed cellulitis of the  right foot but no osteomyelitis. Has been on cefadroxil which was prescribed on 10-25-2022. With that being said it is appearing to me that the patient likely has a infection in regard to the second toe left foot where his other wound is and this is the one that has been most concerned. He actually has bone exposed I am going to perform debridement I want to obtain samples as well to send for pathology and culture. Patient has a past medical history which is significant for diabetes mellitus type 2 with his most recent hemoglobin A1c being 5.1. 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 He also has amputation of the right second toe. The patient also has significant past medical history for chronic venous hypertension. Patient's ABIs were performed on 10-07-2022 showed a right ABI of 1.03 in the left ABI of 1.10. 3/7; this was a patient who is new to our clinic last week. He has a wound on his left second toe anteriorly and the right dorsal foot. Earlier this week I received the culture report and organism responsible for the infection. I was not even sure how did identify this. The bone culture from last week showed osteomyelitis on the left second toe.. The area on the right dorsal foot looks a lot better no evidence of infection 11-24-2022 upon evaluation today patient appears to be doing poorly in regard to his left second toe although the first toe right foot actually looks better in my opinion. I did review his pathology and culture as well and it showed bone necrosis but no definitive osteomyelitis. The culture nonetheless did show with the bone there was bacteria isolated. He has been on the Bactrim although that was for 2 weeks from Dr. Dellia Nims this is the first time I am seeing him since that point. Honestly I am not certain if we should continue that or if he needs something different I did  actually order an x-ray unfortunately there was confusion and in the end we did not end up getting the x-ray. This was on the left foot and interned it was presumed that this was done but what was actually sent which is the x-ray of the right foot which is not what we needed. He has had an x-ray and MRI of the right foot neither of the left foot. For that reason I would have to send him today for the left foot x-ray. Objective Constitutional Well-nourished and well-hydrated in no acute distress. Vitals Time Taken: 9:00 AM, Height: 76 in, Weight: 232 lbs, BMI: 28.2, Temperature: 98.4 F, Pulse: 108 bpm, Respiratory Rate: 18 breaths/min, Blood Pressure: 146/90 mmHg. Respiratory normal breathing without difficulty. Psychiatric this patient is able to make decisions and demonstrates good insight into disease process. Alert and Oriented x 3. pleasant and cooperative. General Notes: Upon inspection patient's wound bed actually showed signs of good granulation in the right great toe location. The left second toe however does not appear to be doing nearly as good in fact not even sure there is not a piece of bone completely separated from the bone exposed in the tip of the toe I am thinking that he may end up needing toe amputation but again I really need to see an x-ray to see what exactly is going on here. The patient is going to go for that x-ray today. Soon as I get that report back I will see what it  says and then we will make discussion on plans of where to go next. AZIZ, CHEAIRS ([id number]) [id number] Page 5 of 6 Integumentary (Hair, Skin) Wound #1 status is Open. Original cause of wound was Pressure Injury. The date acquired was: 10/12/2022. The wound has been in treatment 4 weeks. The wound is located on the Crozier T Second. The wound measures 1.1cm length x 1cm width x 0.6cm depth; 0.864cm^2 area and 0.518cm^3 volume. oe There is bone and joint exposed.  There is no tunneling or undermining noted. There is a large amount of serosanguineous drainage noted. There is medium (34- 66%) red, pink granulation within the wound bed. There is a medium (34-66%) amount of necrotic tissue within the wound bed including Adherent Slough. Wound #2 status is Open. Original cause of wound was Footwear Injury. The date acquired was: 10/12/2022. The wound has been in treatment 4 weeks. The wound is located on the Right,Dorsal Foot. The wound measures 0.4cm length x 0.5cm width x 0.1cm depth; 0.157cm^2 area and 0.016cm^3 volume. There is Fat Layer (Subcutaneous Tissue) exposed. There is no tunneling or undermining noted. There is a large amount of serosanguineous drainage noted. There is medium (34-66%) red, pink granulation within the wound bed. There is a medium (34-66%) amount of necrotic tissue within the wound bed including Adherent Slough. 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 Bathing/ Shower/ Hygiene: May shower; gently cleanse wound with antibacterial soap, rinse and pat dry prior to dressing wounds Anesthetic (Use 'Patient Medications' Section for Anesthetic Order Entry): Lidocaine applied to wound bed Edema Control - Lymphedema / Segmental Compressive Device / Other: Elevate, Exercise Daily and Avoid Standing for Long Periods of Time. Elevate legs to the level of the heart and pump ankles as often as possible Elevate leg(s) parallel to the floor when sitting. Additional Orders /  Instructions: Decrease/Stop Smoking Radiology ordered were: X-ray, foot left - non healing wound with increased drainage WOUND #1: - T Second Wound Laterality: Left, Anterior oe Cleanser: Soap and Water 3 x Per Week/30 Days Discharge Instructions: Gently cleanse wound with antibacterial soap, rinse and pat dry prior to dressing wounds Prim Dressing: Silvercel Small 2x2 (in/in) 3 x Per Week/30 Days ary Discharge Instructions: Apply Silvercel Small 2x2 (in/in) as instructed Secondary Dressing: Gauze 3 x Per Week/30 Days Secured With: Medipore T - 90M Medipore H Soft Cloth Surgical T ape ape, 2x2 (in/yd) 3 x Per Week/30 Days WOUND #2: - Foot Wound Laterality: Dorsal, Right Cleanser: Soap and Water 3 x Per Week/30 Days Discharge Instructions: Gently cleanse wound with antibacterial soap, rinse and pat dry prior to dressing wounds Prim Dressing: Silvercel Small 2x2 (in/in) 3 x Per Week/30 Days ary Discharge Instructions: Apply Silvercel Small 2x2 (in/in) as instructed Secondary Dressing: Coverlet Latex-Free Fabric Adhesive Dressings 3 x Per Week/30 Days Discharge Instructions: 1.5 x 2 1. Based on what I am seeing I do believe that the patient should continue to monitor for any signs of infection worsening such as redness or warmth spreading up his left foot especially the right toe is actually doing quite well. 2. He also needs  to be changed as 3 times per week right now it sounds like he has been getting it 1 time per week and he has not been coming in for his appointments which is not good either. 3. I am also can recommend that the patient should continue to utilize the silver alginate dressing to both wounds which I think should do the best for him at this point. We will see patient back for reevaluation in 1 week here in the clinic. If anything worsens or changes patient will contact our office for additional recommendations. Electronic Signature(s) Signed: 11/24/2022 12:00:26 PM By:  Worthy Keeler PA-C Entered By: Worthy Keeler on 11/24/2022 12:00:26 [name] [name] ([id number]) [id number] Page 6 of 6 -------------------------------------------------------------------------------- SuperBill Details Patient Name: Date of Service: SPARSH, KEAST 11/24/2022 Medical Record Number: [id number] Patient Account Number: [id number] Date of Birth/Sex: Treating RN: 15-Apr-1961 (62 y.o. [name] [name]) [name] [name] Primary Care Provider: SYSTEM, PCP Other Clinician: Referring Provider: Treating Provider/Extender: Audry Pili in Treatment: 4 Diagnosis Coding ICD-10 Codes Code Description E11.621 Type 2 diabetes mellitus with foot ulcer L97.524 Non-pressure chronic ulcer of other part of left foot with necrosis of bone L97.512 Non-pressure chronic ulcer of other part of right foot with fat layer exposed M14.672 Charcot's joint, left ankle and foot 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 I87.323 Chronic venous hypertension (idiopathic) with inflammation of bilateral lower extremity F17.218 Nicotine dependence, cigarettes, with other nicotine-induced disorders Facility Procedures : CPT4 Code: [id number] Description: 99213 - WOUND CARE VISIT-LEV 3 EST PT Modifier: Quantity: 1 Physician Procedures : CPT4 Code Description Modifier [id number] - WC PHYS LEVEL 4 - EST PT ICD-10 Diagnosis Description E11.621 Type 2 diabetes mellitus with foot ulcer L97.524 Non-pressure chronic ulcer of other part of left foot with necrosis of bone L97.512  Non-pressure chronic ulcer of other part of right foot with fat layer exposed M14.672 Charcot's joint, left ankle and foot Quantity: 1 Electronic Signature(s) Signed: 11/24/2022 12:00:42 PM By: Worthy Keeler PA-C Previous Signature: 11/24/2022 9:37:16 AM Version By: [name] Coria RN Entered By: Worthy Keeler on 11/24/2022 12:00:42

## 2022-11-28 ENCOUNTER — Other Ambulatory Visit: Payer: Self-pay | Admitting: Physician Assistant

## 2022-11-28 DIAGNOSIS — E11621 Type 2 diabetes mellitus with foot ulcer: Secondary | ICD-10-CM

## 2022-12-01 ENCOUNTER — Ambulatory Visit: Payer: Medicaid Other | Admitting: Physician Assistant

## 2022-12-01 ENCOUNTER — Ambulatory Visit
Admission: RE | Admit: 2022-12-01 | Discharge: 2022-12-01 | Disposition: A | Discharge status: Home / Self Care | Payer: Medicare Other | Source: Ambulatory Visit | Attending: Physician Assistant | Admitting: Physician Assistant

## 2022-12-01 DIAGNOSIS — E11621 Type 2 diabetes mellitus with foot ulcer: Secondary | ICD-10-CM | POA: Diagnosis present

## 2022-12-01 DIAGNOSIS — L97509 Non-pressure chronic ulcer of other part of unspecified foot with unspecified severity: Secondary | ICD-10-CM | POA: Diagnosis present

## 2022-12-01 MED ORDER — GADOBUTROL 1 MMOL/ML IV SOLN
10.0000 mL | Freq: Once | INTRAVENOUS | Status: AC | PRN
  Administered 2022-12-01: 10 mL via INTRAVENOUS

## 2022-12-07 ENCOUNTER — Encounter: Payer: Medicare Other | Attending: Physician Assistant | Admitting: Physician Assistant

## 2022-12-07 DIAGNOSIS — Z89421 Acquired absence of other right toe(s): Secondary | ICD-10-CM | POA: Insufficient documentation

## 2022-12-07 DIAGNOSIS — L97512 Non-pressure chronic ulcer of other part of right foot with fat layer exposed: Secondary | ICD-10-CM | POA: Diagnosis not present

## 2022-12-07 DIAGNOSIS — I87313 Chronic venous hypertension (idiopathic) with ulcer of bilateral lower extremity: Secondary | ICD-10-CM | POA: Diagnosis not present

## 2022-12-07 DIAGNOSIS — E11621 Type 2 diabetes mellitus with foot ulcer: Secondary | ICD-10-CM | POA: Diagnosis not present

## 2022-12-07 DIAGNOSIS — L97524 Non-pressure chronic ulcer of other part of left foot with necrosis of bone: Secondary | ICD-10-CM | POA: Insufficient documentation

## 2022-12-07 DIAGNOSIS — M86372 Chronic multifocal osteomyelitis, left ankle and foot: Secondary | ICD-10-CM | POA: Diagnosis present

## 2022-12-07 DIAGNOSIS — Z89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: Secondary | ICD-10-CM | POA: Insufficient documentation

## 2022-12-07 DIAGNOSIS — M14672 Charcot's joint, left ankle and foot: Secondary | ICD-10-CM | POA: Diagnosis not present

## 2022-12-07 DIAGNOSIS — F17218 Nicotine dependence, cigarettes, with other nicotine-induced disorders: Secondary | ICD-10-CM | POA: Diagnosis not present

## 2022-12-07 NOTE — Progress Notes (Addendum)
[name] [name] ([id number]) [id number] Page 1 of 8 Visit Report for 12/07/2022 Arrival Information Details Patient Name: Date of Service: [name] [name], [name] [name] [place] [name] [name] 12/07/2022 10:00 A M Medical Record Number: [id number] Patient Account Number: [id number] Date of Birth/Sex: Treating RN: Sep 10, 1960 (62 y.o. [name] [name] Primary Care Jeweline Reif: SYSTEM, PCP Other Clinician: Betha [name] Referring Catalia Massett: Treating Salli Bodin/Extender: Kaylyn Layer in Treatment: 6 Visit Information History Since Last Visit All ordered tests and consults were completed: No Patient Arrived: Ambulatory Added or deleted any medications: No Arrival Time: 10:14 Any new allergies or adverse reactions: No Transfer Assistance: None Had a fall or experienced change in No Patient Identification Verified: Yes activities of daily living that may affect Secondary Verification Process Completed: Yes risk of falls: Patient Requires Transmission-Based Precautions: No Signs or symptoms of abuse/neglect since last visito No Patient Has Alerts: Yes Hospitalized since last visit: No Patient Alerts: Diabetes Type 2 Implantable device outside of the clinic excluding No R ABI 1.03 [name] cellular tissue based products placed in the center since last visit: Has Dressing in Place as Prescribed: Yes Pain Present Now: No Electronic Signature(s) Signed: 12/07/2022 5:04:13 PM By: [name] [name] Entered By: [name] [name] on 12/07/2022 10:22:23 -------------------------------------------------------------------------------- Clinic Level of Care Assessment Details Patient Name: Date of Service: [name] [name], [name] [name] [name] [name] 12/07/2022 10:00 A M Medical Record Number: [id number] Patient Account Number: [id number] Date of Birth/Sex: Treating RN: [birth year] (62 y.o. [name] [name] Primary Care Shenouda Genova: SYSTEM, PCP Other Clinician: Betha [name] Referring Monti Jilek: Treating Ermelinda Eckert/Extender: Kaylyn Layer in Treatment: 6 Clinic Level of Care Assessment Items TOOL 1 Quantity Score  - 0 Use when EandM and Procedure is performed on INITIAL visit ASSESSMENTS - Nursing Assessment / Reassessment  - 0 General Physical Exam (combine w/ comprehensive assessment (listed just below) when performed on new pt. evals)  - 0 Comprehensive Assessment (HX, ROS, Risk Assessments, Wounds Hx, etc.) ASSESSMENTS - Wound and Skin Assessment / Reassessment  - 0 Dermatologic / Skin Assessment (not related to wound area) ASSESSMENTS - Ostomy and/or Continence Assessment and Care  - 0 Incontinence Assessment and Management  - 0 Ostomy Care Assessment and Management (repouching, etc.) PROCESS - Coordination of Care  - 0 Simple Patient / Family Education for ongoing care  - 0 Complex (extensive) Patient / Family Education for ongoing care  - 0 Staff obtains Chiropractor, Records, T Results / Process Orders est  - 0 Staff telephones HHA, Nursing Homes / Clarify orders / etc  - 0 Routine Transfer to another Facility (non-emergent condition)  - 0 Routine Hospital Admission (non-emergent condition) [name] [name], [name] [name] ([id number]) [id number] Page 2 of 8  - 0 New Admissions / Manufacturing engineer / Ordering NPWT Apligraf, etc. ,  - 0 Emergency Hospital Admission (emergent condition) PROCESS - Special Needs  - 0 Pediatric / Minor Patient Management  - 0 Isolation Patient Management  - 0 Hearing / Language / Visual special needs  - 0 Assessment of Community assistance (transportation, D/C planning, etc.)  - 0 Additional assistance / Altered mentation  - 0 Support Surface(s) Assessment (bed, cushion, seat, etc.) INTERVENTIONS - Miscellaneous  - 0 External ear exam  - 0 Patient Transfer (multiple staff / Nurse, adult / Similar devices)  - 0 Simple Staple / Suture removal (25 or less)  - 0 Complex  Staple / Suture removal (26 or more)  - 0 Hypo/Hyperglycemic Management (do not check if billed separately)  - 0 Ankle / Brachial Index (  ABI) - do not check if billed separately Has the patient been seen at the hospital within the last three years: Yes Total Score: 0 Level Of Care: ____ Electronic Signature(s) Signed: 12/07/2022 5:04:13 PM By: [name] [name] Entered By: [name] [name] on 12/07/2022 10:53:48 -------------------------------------------------------------------------------- Encounter Discharge Information Details Patient Name: Date of Service: [name] [name] [name] [name] 12/07/2022 10:00 A M Medical Record Number: [id number] Patient Account Number: [id number] Date of Birth/Sex: Treating RN: Mar 06, 1961 (62 y.o. [name] [name] Primary Care Yeraldine Forney: SYSTEM, PCP Other Clinician: Betha [name] Referring Demetre Monaco: Treating Evart Mcdonnell/Extender: Kaylyn Layer in Treatment: 6 Encounter Discharge Information Items Post Procedure Vitals Discharge Condition: Stable Temperature (F): 98.3 Ambulatory Status: Ambulatory Pulse (bpm): 96 Discharge Destination: Home Respiratory Rate (breaths/min): 18 Transportation: Private Auto Blood Pressure (mmHg): 131/87 Accompanied By: self Schedule Follow-up Appointment: Yes Clinical Summary of Care: Electronic Signature(s) Signed: 12/07/2022 5:04:13 PM By: [name] [name] Entered By: [name] [name] on 12/07/2022 11:27:27 -------------------------------------------------------------------------------- Lower Extremity Assessment Details Patient Name: Date of Service: [name] [name] [name] [name] 12/07/2022 10:00 A M Medical Record Number: [id number] Patient Account Number: [id number] Date of Birth/Sex: Treating RN: 06/24/1961 (62 y.o. [name] [name] Primary Care Semaja Lymon: SYSTEM, PCP Other Clinician: Betha [name] Referring Salina Stanfield: Treating Marrion Accomando/Extender: Kaylyn Layer in Treatment: [address] [name] [name], [name] [name] ([id number]) [id number] Page 3 of 8 Signed: 12/07/2022 5:04:13 PM By: [name] [name] Signed: 12/08/2022 12:36:06 PM By: Elliot Gurney, [name], RN, CWS, [name], [name] Entered By: [name] [name] on 12/07/2022 10:32:20 -------------------------------------------------------------------------------- Multi Wound Chart Details Patient Name: Date of Service: [name] [name] [name] [name] 12/07/2022 10:00 A M Medical Record Number: [id number] Patient Account Number: [id number] Date of Birth/Sex: Treating RN: 11/03/1960 (62 y.o. [name], [name] Primary Care Dandy Lazaro: SYSTEM, PCP Other Clinician: Betha [name] Referring Sahirah Rudell: Treating Jovane Foutz/Extender: Kaylyn Layer in Treatment: 6 Vital Signs Height(in): 76 Pulse(bpm): 96 Weight(lbs): 232 Blood Pressure(mmHg): 131/87 Body Mass Index(BMI): 28.2 Temperature(F): 98.3 Respiratory Rate(breaths/min): 18 [1:Photos:] [N/A:N/A] Left, Anterior T Second oe Right, Dorsal Foot N/A Wound Location: Pressure Injury Footwear Injury N/A Wounding Event: Diabetic Wound/Ulcer of the Lower Diabetic Wound/Ulcer of the Lower N/A Primary Etiology: Extremity Extremity Peripheral Venous Disease, Type II Peripheral Venous Disease, Type II N/A Comorbid History: Diabetes, Osteomyelitis Diabetes, Osteomyelitis 10/12/2022 10/12/2022 N/A Date Acquired: 6 6 N/A Weeks of Treatment: Open Open N/A Wound Status: No No N/A Wound Recurrence: Yes No N/A Pending A mputation on Presentation: 1.2x1.5x0.1 0.1x0.1x0.1 N/A Measurements [name] x W x D (cm) 1.414 0.008 N/A A (cm) : rea 0.141 0.001 N/A Volume (cm) : -48.80% 99.70% N/A % Reduction in A rea: 70.30% 99.60% N/A % Reduction in Volume: Grade 2 Grade 1 N/A Classification: Large Large N/A Exudate A mount: Serosanguineous Serosanguineous N/A Exudate Type: red, brown red, brown N/A Exudate Color: Medium (34-66%) Medium (34-66%) N/A Granulation A  mount: Red, Pink Red, Pink N/A Granulation Quality: Medium (34-66%) Medium (34-66%) N/A Necrotic A mount: Joint: Yes Fat Layer (Subcutaneous Tissue): Yes N/A Exposed Structures: Bone: Yes Fascia: No Fascia: No Tendon: No Fat Layer (Subcutaneous Tissue): No Muscle: No Tendon: No Joint: No Muscle: No Bone: No None Small (1-33%) N/A Epithelialization: Treatment Notes Electronic Signature(s) Signed: 12/07/2022 5:04:13 PM By: [name] [name] Entered By: [name] [name] on 12/07/2022 10:32:24 Multi-Disciplinary Care Plan Details -------------------------------------------------------------------------------- [name] [name] ([id number]) [id number] Page 4 of 8 Patient Name: Date of Service: [name] [name], [name] [name] 12/07/2022 10:00 A M Medical Record Number: [id number] Patient Account Number: [id number] Date of Birth/Sex: Treating RN:  May 02, 1961 (62 y.o. [name] [name] Primary Care Thoren Hosang: SYSTEM, PCP Other Clinician: Betha [name] Referring Amanda Steuart: Treating Selwyn Reason/Extender: Kaylyn Layer in Treatment: 6 Active Inactive Necrotic Tissue Nursing Diagnoses: Impaired tissue integrity related to necrotic/devitalized tissue Knowledge deficit related to management of necrotic/devitalized tissue Goals: Necrotic/devitalized tissue will be minimized in the wound bed Date Initiated: 10/26/2022 Target Resolution Date: 12/25/2022 Goal Status: Active Patient/caregiver will verbalize understanding of reason and process for debridement of necrotic tissue Date Initiated: 10/26/2022 Date Inactivated: 11/24/2022 Target Resolution Date: 11/24/2022 Goal Status: Met Interventions: Assess patient pain level pre-, during and post procedure and prior to discharge Provide education on necrotic tissue and debridement process Treatment Activities: Excisional debridement : 10/26/2022 T ordered outside of clinic : 10/26/2022 est Notes: Wound/Skin  Impairment Nursing Diagnoses: Impaired tissue integrity Knowledge deficit related to smoking impact on wound healing Knowledge deficit related to ulceration/compromised skin integrity Goals: Patient will demonstrate a reduced rate of smoking or cessation of smoking Date Initiated: 10/26/2022 Date Inactivated: 11/24/2022 Target Resolution Date: 11/24/2022 Goal Status: Met Patient will have a decrease in wound volume by X% from date: (specify in notes) Date Initiated: 10/26/2022 Target Resolution Date: 11/24/2022 Goal Status: Active Patient/caregiver will verbalize understanding of skin care regimen Date Initiated: 10/26/2022 Target Resolution Date: 12/25/2022 Goal Status: Active Ulcer/skin breakdown will have a volume reduction of 30% by week 4 Date Initiated: 10/26/2022 Date Inactivated: 11/24/2022 Target Resolution Date: 11/24/2022 Goal Status: Unmet Unmet Reason: comoridities Ulcer/skin breakdown will have a volume reduction of 50% by week 8 Date Initiated: 10/26/2022 Target Resolution Date: 12/25/2022 Goal Status: Active Ulcer/skin breakdown will have a volume reduction of 80% by week 12 Date Initiated: 10/26/2022 Target Resolution Date: 01/24/2023 Goal Status: Active Ulcer/skin breakdown will heal within 14 weeks Date Initiated: 10/26/2022 Target Resolution Date: 02/07/2023 Goal Status: Active Interventions: Assess patient/caregiver ability to obtain necessary supplies Assess patient/caregiver ability to perform ulcer/skin care regimen upon admission and as needed Assess ulceration(s) every visit Provide education on smoking Provide education on ulcer and skin care Treatment Activities: Skin care regimen initiated : 10/26/2022 Smoking cessation education : 10/26/2022 [name] [name], [name] [name] ([id number]) [id number] Page 5 of 8 Notes: Electronic Signature(s) Signed: 12/07/2022 5:04:13 PM By: [name] [name] Signed: 12/08/2022 12:36:06 PM By: Elliot Gurney, [name], RN, CWS, Kim  RN, [name] Entered By: [name] [name] on 12/07/2022 16:01:55 -------------------------------------------------------------------------------- Pain Assessment Details Patient Name: Date of Service: [name] [name] [name] [name] 12/07/2022 10:00 A M Medical Record Number: [id number] Patient Account Number: [id number] Date of Birth/Sex: Treating RN: 1961-06-07 (62 y.o. [name] [name] Primary Care Ry Moody: SYSTEM, PCP Other Clinician: Betha [name] Referring Hamsini Verrilli: Treating Averiana Clouatre/Extender: Kaylyn Layer in Treatment: 6 Active Problems Location of Pain Severity and Description of Pain Patient Has Paino No Site Locations Pain Management and Medication Current Pain Management: Electronic Signature(s) Signed: 12/07/2022 5:04:13 PM By: [name] [name] Signed: 12/08/2022 12:36:06 PM By: Elliot Gurney, [name], RN, CWS, [name], [name] Entered By: [name] [name] on 12/07/2022 10:29:02 -------------------------------------------------------------------------------- Patient/Caregiver Education Details Patient Name: Date of Service: [name] [name] 4/11/2024andnbsp10:00 A M Medical Record Number: [id number] Patient Account Number: [id number] Date of Birth/Gender: Treating RN: [birth year] (62 y.o. [name] [name] Primary Care Physician: SYSTEM, PCP Other Clinician: Betha [name] Referring Physician: Treating Physician/Extender: Kaylyn Layer in Treatment: 6 Education Assessment Education Provided To: Patient Education Topics Provided Hyperbaric Oxygenation: Handouts: Hyperbaric Oxygen Methods: Explain/Verbal BRENDT, DIBLE ([id number]) [id number] Page 6 of 8 Responses: State content correctly Electronic Signature(s) Signed: 12/07/2022 5:04:13 PM  By: [name] [name] Entered By: [name] [name] on 12/07/2022 11:25:21 -------------------------------------------------------------------------------- Wound Assessment Details Patient Name: Date of  Service: [name] [name], [name] [name] [name] [name] 12/07/2022 10:00 A M Medical Record Number: [id number] Patient Account Number: [id number] Date of Birth/Sex: Treating RN: 1961/06/23 (62 y.o. [name], [name] Primary Care Rashan Patient: SYSTEM, PCP Other Clinician: Betha [name] Referring Vania Rosero: Treating Holly Iannaccone/Extender: Kaylyn Layer in Treatment: 6 Wound Status Wound Number: 1 Primary Etiology: Diabetic Wound/Ulcer of the Lower Extremity Wound Location: Left, Anterior T Second oe Wound Status: Open Wounding Event: Pressure Injury Comorbid Peripheral Venous Disease, Type II Diabetes, History: Osteomyelitis Date Acquired: 10/12/2022 Weeks Of Treatment: 6 Clustered Wound: No Pending Amputation On Presentation Photos Wound Measurements Length: (cm) 1.2 Width: (cm) 1.5 Depth: (cm) 0.1 Area: (cm) 1.414 Volume: (cm) 0.141 % Reduction in Area: -48.8% % Reduction in Volume: 70.3% Epithelialization: None Wound Description Classification: Grade 3 Wagner Verification: MRI Exudate Amount: Large Exudate Type: Serosanguineous Exudate Color: red, brown Foul Odor After Cleansing: No Slough/Fibrino Yes Wound Bed Granulation Amount: Medium (34-66%) Exposed Structure Granulation Quality: Red, Pink Fascia Exposed: No Necrotic Amount: Medium (34-66%) Fat Layer (Subcutaneous Tissue) Exposed: No Necrotic Quality: Adherent Slough Tendon Exposed: No Muscle Exposed: No Joint Exposed: Yes Bone Exposed: Yes Treatment Notes Wound #1 (Toe Second) Wound Laterality: Left, Anterior Cleanser Soap and Water Discharge Instruction: Gently cleanse wound with antibacterial soap, rinse and pat dry prior to dressing wounds [name] [name], [name] [name] [id number] Page 7 of 8 Peri-Wound Care Topical Primary Dressing Silvercel Small 2x2 (in/in) Discharge Instruction: Apply Silvercel Small 2x2 (in/in) as instructed Secondary Dressing Gauze Secured With Medipore T - 46M  Medipore H Soft Cloth Surgical T ape ape, 2x2 (in/yd) Compression Wrap Compression Stockings Add-Ons Electronic Signature(s) Signed: 12/07/2022 5:13:20 PM By: [name] PA-C Signed: 12/08/2022 12:36:06 PM By: Elliot Gurney, [name], RN, CWS, [name], [name] Previous Signature: 12/07/2022 5:04:13 PM Version By: [name] [name] Entered By: [name] on 12/07/2022 17:13:19 -------------------------------------------------------------------------------- Wound Assessment Details Patient Name: Date of Service: [name] [name] [name] [name] 12/07/2022 10:00 A M Medical Record Number: [id number] Patient Account Number: [id number] Date of Birth/Sex: Treating RN: 11/13/1960 (62 y.o. [name] [name] Primary Care Eve Rey: SYSTEM, PCP Other Clinician: Betha [name] Referring Laketta Soderberg: Treating Michiah Masse/Extender: Kaylyn Layer in Treatment: 6 Wound Status Wound Number: 2 Primary Etiology: Diabetic Wound/Ulcer of the Lower Extremity Wound Location: Right, Dorsal Foot Wound Status: Healed - Epithelialized Wounding Event: Footwear Injury Comorbid Peripheral Venous Disease, Type II Diabetes, History: Osteomyelitis Date Acquired: 10/12/2022 Weeks Of Treatment: 6 Clustered Wound: No Photos Wound Measurements Length: (cm) Width: (cm) Depth: (cm) Area: (cm) Volume: (cm) 0 % Reduction in Area: 100% 0 % Reduction in Volume: 100% 0 Epithelialization: Large (67-100%) 0 0 Wound Description Classification: Grade 1 Exudate Amount: None Present Foul Odor After Cleansing: No Slough/Fibrino No Wound Bed Granulation Amount: None Present (0%) Exposed Structure Necrotic Amount: None Present (0%) Fascia Exposed: No Fat Layer (Subcutaneous Tissue) Exposed: Yes [name] [name], [name] [name] ([id number]) [id number] Page 8 of 8 Tendon Exposed: No Muscle Exposed: No Joint Exposed: No Bone Exposed: No Treatment Notes Wound #2 (Foot) Wound Laterality: Dorsal, Right Cleanser Peri-Wound  Care Topical Primary Dressing Secondary Dressing Secured With Compression Wrap Compression Stockings Add-Ons Electronic Signature(s) Signed: 12/07/2022 5:04:13 PM By: [name] [name] Signed: 12/08/2022 12:36:06 PM By: Elliot Gurney, [name], RN, CWS, [name], [name] Entered By: [name] [name] on 12/07/2022 10:45:14 -------------------------------------------------------------------------------- Vitals Details Patient Name: Date of Service: [name] [name], BA [name] [name] 12/07/2022 10:00 A M Medical Record Number:  [id number] Patient Account Number: [id number] Date of Birth/Sex: Treating RN: 1961/01/13 (62 y.o. [name] [name] Primary Care Royelle Hinchman: SYSTEM, PCP Other Clinician: Betha [name] Referring Zelpha Messing: Treating Ruqaya Strauss/Extender: Kaylyn Layer in Treatment: 6 Vital Signs Time Taken: 10:22 Temperature (F): 98.3 Height (in): 76 Pulse (bpm): 96 Weight (lbs): 232 Respiratory Rate (breaths/min): 18 Body Mass Index (BMI): 28.2 Blood Pressure (mmHg): 131/87 Reference Range: 80 - 120 mg / dl Electronic Signature(s) Signed: 12/07/2022 5:04:13 PM By: [name] [name] Entered By: [name] [name] on 12/07/2022 10:24:59

## 2022-12-07 NOTE — Progress Notes (Addendum)
RALSTON, VENUS ([id number]) [id number] Page 1 of 9 Visit Report for 12/07/2022 Chief Complaint Document Details Patient Name: Date of Service: ABDALLA, NARAMORE RNEY L. 12/07/2022 10:00 A M Medical Record Number: [id number] Patient Account Number: [id number] Date of Birth/Sex: Treating RN: 1960-11-03 (62 y.o. [name] Primary Care Provider: SYSTEM, PCP Other Clinician: Betha Loa Referring Provider: Treating Provider/Extender: Kaylyn Layer in Treatment: 6 Information Obtained from: Patient Chief Complaint Bilateral Foot Ulcers Electronic Signature(s) Signed: 12/07/2022 9:56:03 AM By: [name] PA-C Entered By: [name] on 12/07/2022 09:56:03 -------------------------------------------------------------------------------- Debridement Details Patient Name: Date of Service: [name] 12/07/2022 10:00 A M Medical Record Number: [id number] Patient Account Number: [id number] Date of Birth/Sex: Treating RN: January 30, 1961 (61 y.o. [name], [name] Primary Care Provider: SYSTEM, PCP Other Clinician: Betha Loa Referring Provider: Treating Provider/Extender: Kaylyn Layer in Treatment: 6 Debridement Performed for Assessment: Wound #1 Left,Anterior T Second oe Performed By: Physician [name], PA-C Debridement Type: Debridement Severity of Tissue Pre Debridement: Fat layer exposed Level of Consciousness (Pre-procedure): Awake and Alert Pre-procedure Verification/Time Out Yes - 10:41 Taken: Start Time: 10:41 T Area Debrided (L x W): otal 1.5 (cm) x 1.5 (cm) = 2.25 (cm) Tissue and other material debrided: Viable, Non-Viable, Callus, Slough, Subcutaneous, Slough Level: Skin/Subcutaneous Tissue Debridement Description: Excisional Instrument: Curette Bleeding: Minimum Hemostasis Achieved: Pressure Response to Treatment: Procedure was tolerated well Level of Consciousness (Post- Awake and  Alert procedure): Post Debridement Measurements of Total Wound Length: (cm) 1.2 Width: (cm) 1.2 Depth: (cm) 0.1 Volume: (cm) 0.113 Character of Wound/Ulcer Post Debridement: Stable Severity of Tissue Post Debridement: Fat layer exposed Post Procedure Diagnosis Same as Pre-procedure Electronic Signature(s) Signed: 12/07/2022 5:04:13 PM By: Betha Loa Signed: 12/07/2022 10:26:23 PM By: [name] PA-C Signed: 12/08/2022 12:36:06 PM By: Elliot Gurney, [name], RN, CWS, [name], [name] Entered By: Betha Loa on 12/07/2022 10:42:27 [name] ([id number]) [id number] Page 2 of 9 -------------------------------------------------------------------------------- HPI Details Patient Name: Date of Service: EARLEY, GROBE 12/07/2022 10:00 A M Medical Record Number: [id number] Patient Account Number: [id number] Date of Birth/Sex: Treating RN: May 18, 1961 (62 y.o. [name] Primary Care Provider: SYSTEM, PCP Other Clinician: Betha Loa Referring Provider: Treating Provider/Extender: Kaylyn Layer in Treatment: 6 History of Present Illness HPI Description: 10-26-2022 upon evaluation today patient presents for initial inspection here in our clinic concerning issues that he has been having with wounds over the right dorsal foot and left distal toe on the dorsal surface more so. With that being said he has had an MRI of the right foot which showed no signs of osteomyelitis I see no x-rays nor MRI of the left foot at all at this point. Nonetheless the left foot actually is the one that appears there could be evidence of infection in the toe that is the 1 I am most concerned about. He is also going to require debridement of both locations I discussed that with him today. The patient's MRI was actually on 10-22-2022 which showed cellulitis of the right foot but no osteomyelitis. Has been on cefadroxil which was prescribed on 10-25-2022. With that being  said it is appearing to me that the patient likely has a infection in regard to the second toe left foot where his other wound is and this is the one that has been most concerned. He actually has bone exposed I am going to perform debridement I want to obtain samples as well to send for pathology and  culture. Patient has a past medical history which is significant for diabetes mellitus type 2 with his most recent hemoglobin A1c being 5.1. 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 He also has amputation of the right second toe. The patient also has significant past medical history for chronic venous hypertension. Patient's ABIs were performed on 10-07-2022 showed a right ABI of 1.03 in the left ABI of 1.10. 3/7; this was a patient who is new to our clinic last week. He has a wound on his left second toe anteriorly and the right dorsal foot. Earlier this week I received the culture report and organism responsible for the infection. I was not even sure how did identify this. The bone culture from last week showed osteomyelitis on the left second toe.. The area on the right dorsal foot looks a lot better no evidence of infection 11-24-2022 upon evaluation today patient appears to be doing poorly in regard to his left second toe although the first toe right foot actually looks better in my opinion. I did review his pathology and culture as well and it showed bone necrosis but no definitive osteomyelitis. The culture nonetheless did show with the bone there was bacteria isolated. He has been on the Bactrim although that was for 2 weeks from Dr. Leanord Hawking this is the first time I am seeing him since that point. Honestly I am not certain if we should continue that or if he needs something different I did actually order an x-ray unfortunately there was confusion and in the end we did not end up getting the x-ray. This was on the left foot and interned it  was presumed that this was done but what was actually sent which is the x-ray of the right foot which is not what we needed. He has had an x-ray and MRI of the right foot neither of the left foot. For that reason I would have to send him today for the left foot x-ray. 12-06-2021 upon evaluation today patient appears to be doing about the same in regard to his toe ulcer. We have subsequently up to this point been treating him for infection. We do have confirmation based on what we see currently with the MRI that he still is continuing to have issues right now with the infection of his toes. There is actually 2 locations 1 that has a wound 1 that does not. With that being said he has been on the Bactrim since 12 February. I think that that has helped to some degree to keep this under control but not fully. He still is taking that at this point and I am actually can renew that today for him to continue the plan. He is in agreement with that plan. Nonetheless he is going to require some additional help to get this close I do believe that I believe for limb salvage it would benefit him to proceed with HBO therapy. We have discussed this previously and loose detail now in greater detail today. Electronic Signature(s) Signed: 12/07/2022 5:09:37 PM By: [name] PA-C Entered By: [name] on 12/07/2022 17:09:37 -------------------------------------------------------------------------------- Physical Exam Details Patient Name: Date of Service: KESEAN, SERVISS RNEY L. 12/07/2022 10:00 A M Medical Record Number: [id number] Patient Account Number: [id number] Date of Birth/Sex: Treating RN: February 27, 1961 (62 y.o. [name] Primary Care Provider: SYSTEM, PCP Other Clinician: Betha Loa Referring Provider: Treating Provider/Extender: Kaylyn Layer  in Treatment: 6 Constitutional Well-nourished and well-hydrated in no acute distress. Respiratory normal breathing without  difficulty. Psychiatric this patient is able to make decisions and demonstrates good insight into disease process. Alert and Oriented x 3. pleasant and cooperative. Notes Upon inspection patient's wound did require sharp debridement to clearway the necrotic debris he tolerated that today without complication and postdebridement wound bed actually appears to be doing much better but still it is not healing nearly as well as we thought it should with that being said I am going to go ahead and see about getting the approval for HBO therapy I would get all the paperwork together and we will submit this for approval. AJAX, SCHROLL ([id number]) [id number] Page 3 of 9 Electronic Signature(s) Signed: 12/07/2022 5:10:06 PM By: [name] PA-C Entered By: [name] on 12/07/2022 17:10:06 -------------------------------------------------------------------------------- Physician Orders Details Patient Name: Date of Service: [name] 12/07/2022 10:00 A M Medical Record Number: [id number] Patient Account Number: [id number] Date of Birth/Sex: Treating RN: 01-16-1961 (62 y.o. [name], [name] Primary Care Provider: SYSTEM, PCP Other Clinician: Betha Loa Referring Provider: Treating Provider/Extender: Kaylyn Layer in Treatment: 6 Verbal / Phone Orders: Yes Clinician: Huel Coventry Read Back and Verified: Yes Diagnosis Coding ICD-10 Coding Code Description E11.621 Type 2 diabetes mellitus with foot ulcer L97.524 Non-pressure chronic ulcer of other part of left foot with necrosis of bone L97.512 Non-pressure chronic ulcer of other part of right foot with fat layer exposed M14.672 Charcot's joint, left ankle and foot 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 I87.323 Chronic venous hypertension (idiopathic) with inflammation of bilateral lower extremity F17.218 Nicotine dependence, cigarettes, with other nicotine-induced  disorders Follow-up Appointments Return Appointment in 1 week. Bathing/ Shower/ Hygiene May shower; gently cleanse wound with antibacterial soap, rinse and pat dry prior to dressing wounds Anesthetic (Use 'Patient Medications' Section for Anesthetic Order Entry) Lidocaine applied to wound bed Edema Control - Lymphedema / Segmental Compressive Device / Other Elevate, Exercise Daily and A void Standing for Long Periods of Time. Elevate legs to the level of the heart and pump ankles as often as possible Elevate leg(s) parallel to the floor when sitting. Additional Orders / Instructions Decrease/Stop Smoking Hyperbaric Oxygen Therapy Wound #1 Left,Anterior T Second oe Evaluate for HBO Therapy Indication and location: - Wagner grade 3 left 2nd toe If appropriate for treatment, begin HBOT per protocol: 2.0 ATA for 90 Minutes without A Breaks ir One treatment per day (delivered Monday through Friday unless otherwise specified in Special Instructions below): Total # of Treatments: - 40 A ntihistamine 30 minutes prior to HBO Treatment, difficulty clearing ears. Finger stick Blood Glucose Pre- and Post- HBOT Treatment. Follow Hyperbaric Oxygen Glycemia Protocol Wound Treatment Wound #1 - T Second oe Wound Laterality: Left, Anterior Cleanser: Soap and Water 3 x Per Week/30 Days Discharge Instructions: Gently cleanse wound with antibacterial soap, rinse and pat dry prior to dressing wounds Prim Dressing: Silvercel Small 2x2 (in/in) 3 x Per Week/30 Days ary Discharge Instructions: Apply Silvercel Small 2x2 (in/in) as instructed Secondary Dressing: Gauze 3 x Per Week/30 Days Secured With: Medipore T - 62M Medipore H Soft Cloth Surgical T ape ape, 2x2 (in/yd) 3 x Per Week/30 Days Patient Medications llergies: No Known Allergies A KALONJI, ZURAWSKI L ([id number]) [id number] Page 4 of 9 Notifications Medication Indication Start End 12/07/2022 Bactrim DS DOSE 1 - oral  800 mg-160 mg tablet - 1 tablet oral twice a day x 30 days GLYCEMIA INTERVENTIONS PROTOCOL  PRE-HBO GLYCEMIA INTERVENTIONS ACTION INTERVENTION Obtain pre-HBO capillary blood glucose (ensure 1 physician order is in chart). A. Notify HBO physician and await physician orders. 2 If result is 70 mg/dl or below: B. If the result meets the hospital definition of a critical result, follow hospital policy. A. Give patient an 8 ounce Glucerna Shake, an 8 ounce Ensure, or 8 ounces of a Glucerna/Ensure equivalent dietary supplement*. B. Wait 30 minutes. If result is 71 mg/dl to 161 mg/dl: C. Retest patients capillary blood glucose (CBG). D. If result greater than or equal to 110 mg/dl, proceed with HBO. If result less than 110 mg/dl, notify HBO physician and consider holding HBO. If result is 131 mg/dl to 096 mg/dl: A. Proceed with HBO. A. Notify HBO physician and await physician orders. B. It is recommended to hold HBO and do If result is 250 mg/dl or greater: blood/urine ketone testing. C. If the result meets the hospital definition of a critical result, follow hospital policy. POST-HBO GLYCEMIA INTERVENTIONS ACTION INTERVENTION Obtain post HBO capillary blood glucose (ensure 1 physician order is in chart). A. Notify HBO physician and await physician orders. 2 If result is 70 mg/dl or below: B. If the result meets the hospital definition of a critical result, follow hospital policy. A. Give patient an 8 ounce Glucerna Shake, an 8 ounce Ensure, or 8 ounces of a Glucerna/Ensure equivalent dietary supplement*. B. Wait 15 minutes for symptoms of If result is 71 mg/dl to 045 mg/dl: hypoglycemia (i.e. nervousness, anxiety, sweating, chills, clamminess, irritability, confusion, tachycardia or dizziness). C. If patient asymptomatic, discharge patient. If patient symptomatic, repeat capillary blood glucose (CBG) and notify HBO physician. If result is 101 mg/dl to 409 mg/dl: A.  Discharge patient. A. Notify HBO physician and await physician orders. B. It is recommended to do blood/urine ketone If result is 250 mg/dl or greater: testing. C. If the result meets the hospital definition of a critical result, follow hospital policy. *Juice or candies are NOT equivalent products. If patient refuses the Glucerna or Ensure, please consult the hospital dietitian for an appropriate substitute. Electronic Signature(s) Signed: 12/07/2022 5:11:28 PM By: [name] PA-C Previous Signature: 12/07/2022 5:04:13 PM Version By: Betha Loa Entered By: [name] on 12/07/2022 17:11:28 -------------------------------------------------------------------------------- Problem List Details Patient Name: Date of Service: [name] 12/07/2022 10:00 A M Medical Record Number: [id number] Patient Account Number: [id number] Date of Birth/Sex: Treating RN: 08/27/1961 (62 y.o. [name] Primary Care Provider: SYSTEM, PCP Other Clinician: Betha Loa Referring Provider: Treating Provider/Extender: Kaylyn Layer in Treatment: 6 Active Problems ICD-10 REVAN, GENDRON ([id number]) [id number] Page 5 of 9 Encounter Code Description Active Date MDM Diagnosis M86.372 Chronic multifocal osteomyelitis, left ankle and foot 12/07/2022 No Yes E11.621 Type 2 diabetes mellitus with foot ulcer 10/26/2022 No Yes L97.524 Non-pressure chronic ulcer of other part of left foot with necrosis of bone 10/26/2022 No Yes L97.512 Non-pressure chronic ulcer of other part of right foot with fat layer exposed 10/26/2022 No Yes M14.672 Charcot's joint, left ankle and foot 10/26/2022 No Yes 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 10/26/2022 No Yes I87.323 Chronic venous hypertension (idiopathic) with inflammation of bilateral lower 10/26/2022 No Yes extremity F17.218 Nicotine dependence, cigarettes, with other nicotine-induced disorders 10/26/2022 No  Yes Inactive Problems Resolved Problems Electronic Signature(s) Signed: 12/07/2022 5:12:14 PM By: [name] PA-C Previous Signature: 12/07/2022 9:55:59 AM Version By: [name] PA-C Entered By: [name] on 12/07/2022 17:12:14 -------------------------------------------------------------------------------- Progress Note Details Patient Name: Date of  Service: [name] 12/07/2022 10:00 A M Medical Record Number: [id number] Patient Account Number: [id number] Date of Birth/Sex: Treating RN: 1960-12-14 (62 y.o. [name] Primary Care Provider: SYSTEM, PCP Other Clinician: Betha Loa Referring Provider: Treating Provider/Extender: Kaylyn Layer in Treatment: 6 Subjective Chief Complaint Information obtained from Patient Bilateral Foot Ulcers History of Present Illness (HPI) 10-26-2022 upon evaluation today patient presents for initial inspection here in our clinic concerning issues that he has been having with wounds over the right dorsal foot and left distal toe on the dorsal surface more so. With that being said he has had an MRI of the right foot which showed no signs of osteomyelitis I see no x-rays nor MRI of the left foot at all at this point. Nonetheless the left foot actually is the one that appears there could be evidence of infection in the toe that is the 1 I am most concerned about. He is also going to require debridement of both locations I discussed that with him today. The patient's MRI was actually on 10-22-2022 which showed cellulitis of the right foot but no osteomyelitis. Has been on cefadroxil which was prescribed on 10-25-2022. With that being said it is appearing to me that the patient likely has a infection in regard to the second toe left foot where his other wound is and this is the one that has been most concerned. He actually has bone exposed I am going to perform debridement I want to obtain samples as well to send for  pathology and culture. Patient has a past medical history which is significant for diabetes mellitus type 2 with his most recent hemoglobin A1c being 5.1. 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 He also has amputation of the right second toe. The patient also has significant past medical history for chronic venous hypertension. Patient's ABIs were performed on 10-07-2022 showed a right ABI of 1.03 in the left ABI of 1.10. FINBAR, NIPPERT ([id number]) [id number] Page 6 of 9 3/7; this was a patient who is new to our clinic last week. He has a wound on his left second toe anteriorly and the right dorsal foot. Earlier this week I received the culture report and organism responsible for the infection. I was not even sure how did identify this. The bone culture from last week showed osteomyelitis on the left second toe.. The area on the right dorsal foot looks a lot better no evidence of infection 11-24-2022 upon evaluation today patient appears to be doing poorly in regard to his left second toe although the first toe right foot actually looks better in my opinion. I did review his pathology and culture as well and it showed bone necrosis but no definitive osteomyelitis. The culture nonetheless did show with the bone there was bacteria isolated. He has been on the Bactrim although that was for 2 weeks from Dr. Leanord Hawking this is the first time I am seeing him since that point. Honestly I am not certain if we should continue that or if he needs something different I did actually order an x-ray unfortunately there was confusion and in the end we did not end up getting the x-ray. This was on the left foot and interned it was presumed that this was done but what was actually sent which is the x-ray of the right foot which is not what we needed. He has  had an x-ray and MRI of the right foot neither of the left foot. For that  reason I would have to send him today for the left foot x-ray. 12-06-2021 upon evaluation today patient appears to be doing about the same in regard to his toe ulcer. We have subsequently up to this point been treating him for infection. We do have confirmation based on what we see currently with the MRI that he still is continuing to have issues right now with the infection of his toes. There is actually 2 locations 1 that has a wound 1 that does not. With that being said he has been on the Bactrim since 12 February. I think that that has helped to some degree to keep this under control but not fully. He still is taking that at this point and I am actually can renew that today for him to continue the plan. He is in agreement with that plan. Nonetheless he is going to require some additional help to get this close I do believe that I believe for limb salvage it would benefit him to proceed with HBO therapy. We have discussed this previously and loose detail now in greater detail today. Objective Constitutional Well-nourished and well-hydrated in no acute distress. Vitals Time Taken: 10:22 AM, Height: 76 in, Weight: 232 lbs, BMI: 28.2, Temperature: 98.3 F, Pulse: 96 bpm, Respiratory Rate: 18 breaths/min, Blood Pressure: 131/87 mmHg. Respiratory normal breathing without difficulty. Psychiatric this patient is able to make decisions and demonstrates good insight into disease process. Alert and Oriented x 3. pleasant and cooperative. General Notes: Upon inspection patient's wound did require sharp debridement to clearway the necrotic debris he tolerated that today without complication and postdebridement wound bed actually appears to be doing much better but still it is not healing nearly as well as we thought it should with that being said I am going to go ahead and see about getting the approval for HBO therapy I would get all the paperwork together and we will submit this for  approval. Integumentary (Hair, Skin) Wound #1 status is Open. Original cause of wound was Pressure Injury. The date acquired was: 10/12/2022. The wound has been in treatment 6 weeks. The wound is located on the Left,Anterior T Second. The wound measures 1.2cm length x 1.5cm width x 0.1cm depth; 1.414cm^2 area and 0.141cm^3 volume. oe There is bone and joint exposed. There is a large amount of serosanguineous drainage noted. There is medium (34-66%) red, pink granulation within the wound bed. There is a medium (34-66%) amount of necrotic tissue within the wound bed including Adherent Slough. Wound #2 status is Healed - Epithelialized. Original cause of wound was Footwear Injury. The date acquired was: 10/12/2022. The wound has been in treatment 6 weeks. The wound is located on the Right,Dorsal Foot. The wound measures 0cm length x 0cm width x 0cm depth; 0cm^2 area and 0cm^3 volume. There is Fat Layer (Subcutaneous Tissue) exposed. There is a none present amount of drainage noted. There is no granulation within the wound bed. There is no necrotic tissue within the wound bed. 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 He also had a bilateral lower extremity arterial duplex scan which revealed no evidence of significant lower extremity arterial occlusive disease and that was on  10-22-2022. Nutritional management ELIOTT, AMPARAN ([id number]) [id number] Page 7 of 9 Patient has been advised to increase protein intake and seems to be improving in this regard. He tells me that his protein intake is significantly better than where he started which is great news. Glucose control Patient's hemoglobin A1c was 5.1 and this was on October 21, 2022 he seems to be doing excellent. Debridement wound bed management Patient has been undergoing debridements in the office as necessary to remove nonviable tissue and promote healthy healing. This is evaluated each week as I see him in order to ensure that he is having the best chance possible to continue to progress from the standpoint of new epithelial and granular tissue growth. These debridements include sharp debridement using a curette to clean away any nonviable tissue down to healthy tissue. Offloading Patient has been using an offloading shoe in order to protect the toe is much as possible and allow the optimal ability to heal this wound. Were also applied protective dressings in order to pad and try to protect the toe as well. Control of infection Patient was originally placed on Bactrim on 11-03-2022 upon record review of the online pharmacy database. This was again an outside provider prior to the patient coming to see me. This was refilled by myself on 12-07-2022. He has therefore been on medication continually since 11-03-2022. We also have a wound culture which was performed on 10-26-2022 which came back with a positive result for a Alcaligenes faecalis which is sensitive to Bactrim so he has been on the appropriate antibiotics. X-Ray results Patient an x-ray of the foot which was performed on 11-25-2022 which showed a positive finding for osteomyelitis likely of the second toe. MRI results Patient had an MRI performed of the left foot which showed osteomyelitis of the residual second toe middle phalanx  with adjacent soft tissue ulcer and cellulitis. There were also findings of marrow edema within the residual third toe middle phalanx which is favored to be early osteomyelitis with associated soft tissue swelling. Culture results Wound culture which was performed on 10-26-2022 which came back with a positive result for a Alcaligenes faecalis which is sensitive to Bactrim so he has been on the appropriate antibiotics. Labs reviewed Patient CBC revealed a slight anemia with a hemoglobin of 11.6 and hematocrit 34.9. With that being said his white blood cell count was 4.4 and I think even with this slight anemia he has the ability to heal this is not too low in that respect. This test was performed on 10-23-2022 also reviewed the pathology that was taken by myself of the bone and cartilage fragments which showed chronic inflammation and necrosis but not direct osteomyelitis and this was performed on 10-26-2022 I also reviewed the patient's EKG which revealed normal sinus rhythm and was a normal EKG. Lastly the patient did have a screening chest x-ray for hyperbarics which was negative for acute pulmonary disease and this was performed on 10-21-2022 Plan of care/Summary Based on the above information I believe this patient is a good candidate for hyperbaric oxygen therapy to prevent further amputation and achieve limb salvage. That is the ultimate goal here. With  that being said along the way we are getting continue with the antibiotic therapy as this is can be crucial to his healing process. Were also going to continue with regular weekly office visits in order to evaluate for the necessity and need for continued debridement as the wound heals along his trajectory. I do believe that the hyperbarics offers him the best chance at preventing further amputation and the patient is in agreement with proceeding as such. We will obviously continue to monitor for progress along the course of therapy and if there is  any need for a change in plan we will address that at that time. As it stands right now unfortunately the patient has failed conventional management and without more drastic intervention I think that there is a good chance that he is going to end up with further amputation which she is desperately wanting to avoid. Procedures Wound #1 Pre-procedure diagnosis of Wound #1 is a Diabetic Wound/Ulcer of the Lower Extremity located on the Left,Anterior T Second .Severity of Tissue Pre oe Debridement is: Fat layer exposed. There was a Excisional Skin/Subcutaneous Tissue Debridement with a total area of 2.25 sq cm performed by [name], PA-C. With the following instrument(s): Curette to remove Viable and Non-Viable tissue/material. Material removed includes Callus, Subcutaneous Tissue, and Slough. A time out was conducted at 10:41, prior to the start of the procedure. A Minimum amount of bleeding was controlled with Pressure. The procedure was tolerated well. Post Debridement Measurements: 1.2cm length x 1.2cm width x 0.1cm depth; 0.113cm^3 volume. Character of Wound/Ulcer Post Debridement is stable. Severity of Tissue Post Debridement is: Fat layer exposed. Post procedure Diagnosis Wound #1: Same as Pre-Procedure CODI, KERTZ ([id number]) [id number] Page 8 of 9 Plan Follow-up Appointments: Return Appointment in 1 week. Bathing/ Shower/ Hygiene: May shower; gently cleanse wound with antibacterial soap, rinse and pat dry prior to dressing wounds Anesthetic (Use 'Patient Medications' Section for Anesthetic Order Entry): Lidocaine applied to wound bed Edema Control - Lymphedema / Segmental Compressive Device / Other: Elevate, Exercise Daily and Avoid Standing for Long Periods of Time. Elevate legs to the level of the heart and pump ankles as often as possible Elevate leg(s) parallel to the floor when sitting. 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 Finger stick Blood Glucose Pre- and Post- HBOT Treatment. Follow Hyperbaric Oxygen Glycemia Protocol The following medication(s) was prescribed: Bactrim DS oral 800 mg-160 mg tablet 1 1 tablet oral twice a day x 30 days starting 12/07/2022 WOUND #1: - T Second Wound Laterality: Left, Anterior oe Cleanser: Soap and Water 3 x Per Week/30 Days Discharge Instructions: Gently cleanse wound with antibacterial soap, rinse and pat dry prior to dressing wounds Prim Dressing: Silvercel Small 2x2 (in/in) 3 x Per Week/30 Days ary Discharge Instructions: Apply Silvercel Small 2x2 (in/in) as instructed Secondary Dressing: Gauze 3 x Per Week/30 Days Secured With: Medipore T - 64M Medipore H Soft Cloth Surgical T ape ape, 2x2 (in/yd) 3 x Per Week/30 Days 1. I do believe the patient should go forward with HBO therapy has been on antibiotics since  February 12 that is something I am continuing today as well that is going to be Bactrim. 2. I am also can recommend that the patient should continue with appropriate good wound care weekly I think that is still going to be the way to go currently obviously. With that being said I do believe that he has obvious signs of osteomyelitis both on MRI as well as on pathology as well as finding positive culture findings that indicate there is infection as well. We need to continue to monitor and manage this I do believe ongoing through the course of HBO therapy. I am going to plan for the Bactrim for the next 2  months during the course of time when the I expect him to pretty much to be undergoing HBO therapy. We will see patient back for reevaluation in 1 week here in the clinic. If anything worsens or changes patient will contact our office for additional recommendations. Electronic Signature(s) Signed: 12/11/2022 1:09:43 PM By: [name] PA-C Previous Signature: 12/07/2022 5:13:33 PM Version By: [name] PA-C Previous Signature: 12/07/2022 5:12:34 PM Version By: [name] PA-C Entered By: [name] on 12/11/2022 13:09:43 -------------------------------------------------------------------------------- SuperBill Details Patient Name: Date of Service: [name] 12/07/2022 Medical Record Number: [id number] Patient Account Number: [id number] Date of Birth/Sex: Treating RN: Oct 15, 1960 (62 y.o. 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 I87.323 Chronic venous hypertension (idiopathic) with inflammation of bilateral lower extremity RIK, BERKELEY L ([id number]) [id number] Page 9 of 9 F17.218 Nicotine dependence, cigarettes, with other nicotine-induced disorders Facility Procedures : CPT4 Code: [id number] Description: 11042 - DEB SUBQ TISSUE 20 SQ CM/< ICD-10 Diagnosis Description L97.524 Non-pressure chronic ulcer of other part of left foot with necrosis of bone Modifier: Quantity: 1 Physician Procedures :  CPT4 Code Description Modifier [id number] 99214 - WC PHYS LEVEL 4 - EST PT 25 ICD-10 Diagnosis Description M86.372 Chronic multifocal osteomyelitis, left ankle and foot E11.621 Type 2 diabetes mellitus with foot ulcer L97.524 Non-pressure chronic ulcer of  other part of left foot with necrosis of bone L97.512 Non-pressure chronic ulcer of other part of right foot with fat layer exposed Quantity: 1 : [id number] 11042 - WC PHYS SUBQ TISS 20 SQ CM ICD-10 Diagnosis Description L97.524 Non-pressure chronic ulcer of other part of left foot with necrosis of bone Quantity: 1 Electronic Signature(s) Signed: 12/07/2022 5:12:57 PM By: [name] PA-C Entered By: [name] on 12/07/2022 17:12:56

## 2022-12-08 NOTE — Progress Notes (Signed)
[name], [name] ([id number]) [id number] Page 1 of 1 Visit Report for 12/07/2022 HBO Patient Questionnaire Details Patient Name: Date of Service: [name] [name], [name] RNEY L. 12/07/2022 10:00 A M Medical Record Number: [id number] Patient Account Number: [id number] Date of Birth/Sex: Treating RN: 1961/05/19 (62 y.o. [name] Primary Care Dicie Edelen: SYSTEM, PCP Other Clinician: Betha [name] Referring Zebediah Beezley: Treating Levan Aloia/Extender: Kaylyn Layer in Treatment: 6 HBO Patient Questionnaire Items Answer A "Yes" answers must be brought to the hyperbaric physician's attention. ny Breathing or Lung problemso No Currently use tobacco productso Yes Used tobacco products in the pasto Yes Heart problemso No Do you take water pills (diuretic)o No Diabeteso Yes On Diabetes pillo No On Insulino No Dialysiso No Eye problems like glaucomao No Ear problems or surgeryo No Sinus Problemso No Cancero No Confinement Anxiety (Claustrophobia- fear of confined places)o No Any medical implants/devices that are fully or partially implanted or attached to your bodyo No Pregnanto No Seizureso No Date of last: Chest x-ray: 10/21/2022 EKG: 10/23/2022 CBC: 10/23/2022 Electronic Signature(s) Signed: 12/07/2022 5:04:13 PM By: [name] [name] Signed: 12/08/2022 12:36:06 PM By: Elliot Gurney, [name], RN, CWS, [name], [name] Entered By: [name] [name] on 12/07/2022 10:53:42

## 2022-12-13 ENCOUNTER — Encounter (HOSPITAL_BASED_OUTPATIENT_CLINIC_OR_DEPARTMENT_OTHER): Payer: Medicare Other | Admitting: Internal Medicine

## 2022-12-13 DIAGNOSIS — E11621 Type 2 diabetes mellitus with foot ulcer: Secondary | ICD-10-CM | POA: Diagnosis not present

## 2022-12-13 DIAGNOSIS — M86372 Chronic multifocal osteomyelitis, left ankle and foot: Secondary | ICD-10-CM

## 2022-12-13 LAB — GLUCOSE, CAPILLARY
Glucose-Capillary: 91 mg/dL (ref 70–99)
Glucose-Capillary: 116 mg/dL — ABNORMAL HIGH (ref 70–99)
Glucose-Capillary: 157 mg/dL — ABNORMAL HIGH (ref 70–99)
Glucose-Capillary: 126 mg/dL — ABNORMAL HIGH (ref 70–99)

## 2022-12-14 ENCOUNTER — Ambulatory Visit: Payer: Medicaid Other | Admitting: Physician Assistant

## 2022-12-14 ENCOUNTER — Encounter: Payer: Medicare Other | Admitting: Physician Assistant

## 2022-12-14 LAB — GLUCOSE, CAPILLARY: Glucose-Capillary: 96 mg/dL (ref 70–99)

## 2022-12-15 ENCOUNTER — Encounter: Payer: Medicare Other | Admitting: Physician Assistant

## 2022-12-15 DIAGNOSIS — E11621 Type 2 diabetes mellitus with foot ulcer: Secondary | ICD-10-CM | POA: Diagnosis not present

## 2022-12-15 LAB — GLUCOSE, CAPILLARY: Glucose-Capillary: 162 mg/dL — ABNORMAL HIGH (ref 70–99)

## 2022-12-15 NOTE — Progress Notes (Addendum)
[name] [name] ([id number]) [id number] Page 1 of 7 Visit Report for 12/15/2022 Chief Complaint Document Details Patient Name: Date of Service: [name] [name], [name] RNEY L. 12/15/2022 10:00 A M Medical Record Number: [id number] Patient Account Number: [id number] Date of Birth/Sex: Treating RN: March 04, 1961 (62 y.o. [name] [name]) Yevonne Pax Primary Care Provider: SYSTEM, PCP Other Clinician: Referring Provider: Treating Provider/Extender: Kaylyn Layer in Treatment: 7 Information Obtained from: Patient Chief Complaint Bilateral Foot Ulcers Electronic Signature(s) Signed: 12/15/2022 10:02:22 AM By: [name] PA-C Entered By: [name] on 12/15/2022 10:02:22 -------------------------------------------------------------------------------- HPI Details Patient Name: Date of Service: [name] Sells RNEY L. 12/15/2022 10:00 A M Medical Record Number: [id number] Patient Account Number: [id number] Date of Birth/Sex: Treating RN: 27-Sep-1960 (62 y.o. [name] [name]) Yevonne Pax Primary Care Provider: SYSTEM, PCP Other Clinician: Referring Provider: Treating Provider/Extender: Kaylyn Layer in Treatment: 7 History of Present Illness HPI Description: 10-26-2022 upon evaluation today patient presents for initial inspection here in our clinic concerning issues that he has been having with wounds over the right dorsal foot and left distal toe on the dorsal surface more so. With that being said he has had an MRI of the right foot which showed no signs of osteomyelitis I see no x-rays nor MRI of the left foot at all at this point. Nonetheless the left foot actually is the one that appears there could be evidence of infection in the toe that is the 1 I am most concerned about. He is also going to require debridement of both locations I discussed that with him today. The patient's MRI was actually on 10-22-2022 which showed cellulitis of the right foot but no  osteomyelitis. Has been on cefadroxil which was prescribed on 10-25-2022. With that being said it is appearing to me that the patient likely has a infection in regard to the second toe left foot where his other wound is and this is the one that has been most concerned. He actually has bone exposed I am going to perform debridement I want to obtain samples as well to send for pathology and culture. Patient has a past medical history which is significant for diabetes mellitus type 2 with his most recent hemoglobin A1c being 5.1. 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 He also has amputation of the right second toe. The patient also has significant past medical history for chronic venous hypertension. Patient's ABIs were performed on 10-07-2022 showed a right ABI of 1.03 in the left ABI of 1.10. 3/7; this was a patient who is new to our clinic last week. He has a wound on his left second toe anteriorly and the right dorsal foot. Earlier this week I received the culture report and organism responsible for the infection. I was not even sure how did identify this. The bone culture from last week showed osteomyelitis on the left second toe.. The area on the right dorsal foot looks a lot better no evidence of infection 11-24-2022 upon evaluation today patient appears to be doing poorly in regard to his left second toe although the first toe right foot actually looks better in my opinion. I did review his pathology and culture as well and it showed bone necrosis but no definitive osteomyelitis. The culture nonetheless did show with the bone there was bacteria isolated. He has been on the Bactrim although that was for 2 weeks from Dr. Leanord Hawking this is the first time  I am seeing him since that point. Honestly I am not certain if we should continue that or if he needs something different I did actually order an x-ray unfortunately there was confusion and  in the end we did not end up getting the x-ray. This was on the left foot and interned it was presumed that this was done but what was actually sent which is the x-ray of the right foot which is not what we needed. He has had an x-ray and MRI of the right foot neither of the left foot. For that reason I would have to send him today for the left foot x-ray. 12-06-2021 upon evaluation today patient appears to be doing about the same in regard to his toe ulcer. We have subsequently up to this point been treating him for infection. We do have confirmation based on what we see currently with the MRI that he still is continuing to have issues right now with the infection of his toes. There is actually 2 locations 1 that has a wound 1 that does not. With that being said he has been on the Bactrim since 12 February. I think that that has helped to some degree to keep this under control but not fully. He still is taking that at this point and I am actually can renew that today for him to continue the plan. He is in agreement with that plan. Nonetheless he is going to require some additional help to get this close I do believe that I believe for limb salvage it would benefit him to proceed with HBO therapy. We have discussed this previously and loose detail now in greater detail today. 12-15-2022 upon evaluation today patient appears to be doing well currently in regard to his wound. He has been tolerating the dressing changes without complication. Fortunately there does not appear to be any signs of active infection systemically which is great news. Electronic Signature(s) [name] [name] ([id number]) [id number] Page 2 of 7 Signed: 12/15/2022 10:18:19 AM By: [name] PA-C Previous Signature: 12/15/2022 10:17:38 AM Version By: [name] PA-C Entered By: [name] on 12/15/2022 10:18:19 -------------------------------------------------------------------------------- Physical Exam  Details Patient Name: Date of Service: [name] Sells RNEY L. 12/15/2022 10:00 A M Medical Record Number: [id number] Patient Account Number: [id number] Date of Birth/Sex: Treating RN: 09/15/1960 (62 y.o. Melonie [place] Primary Care Provider: SYSTEM, PCP Other Clinician: Referring Provider: Treating Provider/Extender: Kaylyn Layer in Treatment: 7 Constitutional Well-nourished and well-hydrated in no acute distress. Respiratory normal breathing without difficulty. Psychiatric this patient is able to make decisions and demonstrates good insight into disease process. Alert and Oriented x 3. pleasant and cooperative. Notes Upon inspection patient appears to be doing well. He does not show any signs of systemic infection which is good he actually is doing well with his blood sugar today and I think he should be able to get an hyperbarics which is good we need to try to get this healed as quickly as possible. Electronic Signature(s) Signed: 12/15/2022 10:18:43 AM By: [name] PA-C Entered By: [name] on 12/15/2022 10:18:43 -------------------------------------------------------------------------------- Physician Orders Details Patient Name: Date of Service: [name] Sells RNEY L. 12/15/2022 10:00 A M Medical Record Number: [id number] Patient Account Number: [id number] Date of Birth/Sex: Treating RN: 1961/08/18 (62 y.o. Melonie [place] Primary Care Provider: SYSTEM, PCP Other Clinician: Referring Provider: Treating Provider/Extender: Kaylyn Layer in Treatment: 7 Verbal / Phone Orders: No Diagnosis Coding ICD-10 Coding Code Description M86.372 Chronic multifocal  osteomyelitis, left ankle and foot E11.621 Type 2 diabetes mellitus with foot ulcer L97.524 Non-pressure chronic ulcer of other part of left foot with necrosis of bone L97.512 Non-pressure chronic ulcer of other part of right foot with fat layer exposed M14.672 Charcot's joint, left  ankle and foot 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 I87.323 Chronic venous hypertension (idiopathic) with inflammation of bilateral lower extremity F17.218 Nicotine dependence, cigarettes, with other nicotine-induced disorders Follow-up Appointments Return Appointment in 1 week. Home Health [facility] for wound care. May utilize formulary equivalent dressing for wound treatment orders unless otherwise specified. Home Health Nurse may visit PRN to address patients wound care needs. Aldine Contes [phone] Scheduled days for dressing changes to be completed; exception, patient has scheduled wound care visit that day. - pt seen weekly in wound clinic for dressing change, patient seen daily for HBO treatments. PLEASE schedule visits for wound dressing changes accordingly Bathing/ Shower/ Hygiene May shower; gently cleanse wound with antibacterial soap, rinse and pat dry prior to dressing wounds Anesthetic (Use 'Patient Medications' Section for Anesthetic Order Entry) GARLAN, DREWES ([id number]) [id number] Page 3 of 7 Lidocaine applied to wound bed Edema Control - Lymphedema / Segmental Compressive Device / Other Elevate, Exercise Daily and A void Standing for Long Periods of Time. Elevate legs to the level of the heart and pump ankles as often as possible Elevate leg(s) parallel to the floor when sitting. Additional Orders / Instructions Decrease/Stop Smoking Hyperbaric Oxygen Therapy Wound #1 Left,Anterior T Second oe Indication and location: - Wagner grade 3 left 2nd toe 2.0 ATA for 90 Minutes without A Breaks ir One treatment per day (delivered Monday through Friday unless otherwise specified in Special Instructions below): Total # of Treatments: - 40 Antihistamine 30 minutes prior to HBO Treatment, difficulty clearing ears. Finger stick Blood Glucose Pre- and Post- HBOT Treatment. Follow Hyperbaric Oxygen [name] [name] Protocol Wound  Treatment Wound #1 - T Second oe Wound Laterality: Left, Anterior Cleanser: Soap and Water 3 x Per Week/30 Days Discharge Instructions: Gently cleanse wound with antibacterial soap, rinse and pat dry prior to dressing wounds Prim Dressing: Silvercel Small 2x2 (in/in) 3 x Per Week/30 Days ary Discharge Instructions: Apply Silvercel Small 2x2 (in/in) as instructed Secondary Dressing: Gauze 3 x Per Week/30 Days Secured With: Medipore T - 23M Medipore H Soft Cloth Surgical T ape ape, 2x2 (in/yd) 3 x Per Week/30 Days [name] [name] INTERVENTIONS PROTOCOL PRE-HBO [name] [name] INTERVENTIONS ACTION INTERVENTION Obtain pre-HBO capillary blood glucose (ensure 1 physician order is in chart). A. Notify HBO physician and await physician orders. 2 If result is 70 mg/dl or below: B. If the result meets the hospital definition of a critical result, follow hospital policy. A. Give patient an 8 ounce Glucerna Shake, an 8 ounce Ensure, or 8 ounces of a Glucerna/Ensure equivalent dietary supplement*. B. Wait 30 minutes. If result is 71 mg/dl to 253 mg/dl: C. Retest patients capillary blood glucose (CBG). D. If result greater than or equal to 110 mg/dl, proceed with HBO. If result less than 110 mg/dl, notify HBO physician and consider holding HBO. If result is 131 mg/dl to 664 mg/dl: A. Proceed with HBO. A. Notify HBO physician and await physician orders. B. It is recommended to hold HBO and do If result is 250 mg/dl or greater: blood/urine ketone testing. C. If the result meets the hospital definition of a critical result, follow hospital policy. POST-HBO [name] [name] INTERVENTIONS ACTION INTERVENTION Obtain post HBO capillary blood glucose (ensure 1 physician order is in  chart). A. Notify HBO physician and await physician orders. 2 If result is 70 mg/dl or below: B. If the result meets the hospital definition of a critical result, follow hospital policy. A. Give patient an 8 ounce Glucerna  Shake, an 8 ounce Ensure, or 8 ounces of a Glucerna/Ensure equivalent dietary supplement*. B. Wait 15 minutes for symptoms of If result is 71 mg/dl to 562 mg/dl: hypoglycemia (i.e. nervousness, anxiety, sweating, chills, clamminess, irritability, confusion, tachycardia or dizziness). C. If patient asymptomatic, discharge patient. If patient symptomatic, repeat capillary blood glucose (CBG) and notify HBO physician. If result is 101 mg/dl to 130 mg/dl: A. Discharge patient. A. Notify HBO physician and await physician orders. B. It is recommended to do blood/urine ketone If result is 250 mg/dl or greater: testing. [name] [name], [name] [name] ([id number]) [id number] Page 4 of 7 C. If the result meets the hospital definition of a critical result, follow hospital policy. *Juice or candies are NOT equivalent products. If patient refuses the Glucerna or Ensure, please consult the hospital dietitian for an appropriate substitute. Electronic Signature(s) Signed: 12/15/2022 11:42:53 AM By: Yevonne Pax RN Signed: 12/15/2022 1:44:50 PM By: [name] PA-C Entered By: Yevonne Pax on 12/15/2022 10:25:57 -------------------------------------------------------------------------------- Problem List Details Patient Name: Date of Service: [name] Sells RNEY L. 12/15/2022 10:00 A M Medical Record Number: [id number] Patient Account Number: [id number] Date of Birth/Sex: Treating RN: 1961-03-23 (62 y.o. [name] [name]) Yevonne Pax Primary Care Provider: SYSTEM, PCP Other Clinician: Referring Provider: Treating Provider/Extender: Kaylyn Layer in Treatment: 7 Active Problems ICD-10 Encounter Code Description Active Date MDM Diagnosis [phone] Chronic multifocal osteomyelitis, left ankle and foot 12/07/2022 No Yes E11.621 Type 2 diabetes mellitus with foot ulcer 10/26/2022 No Yes L97.524 Non-pressure chronic ulcer of other part of left foot with necrosis of bone 10/26/2022 No  Yes L97.512 Non-pressure chronic ulcer of other part of right foot with fat layer exposed 10/26/2022 No Yes M14.672 Charcot's joint, left ankle and foot 10/26/2022 No Yes 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 10/26/2022 No Yes I87.323 Chronic venous hypertension (idiopathic) with inflammation of bilateral lower 10/26/2022 No Yes extremity F17.218 Nicotine dependence, cigarettes, with other nicotine-induced disorders 10/26/2022 No Yes Inactive Problems Resolved Problems Electronic Signature(s) Signed: 12/15/2022 10:02:07 AM By: [name] PA-C Entered By: [name] on 12/15/2022 10:02:06 Progress Note Details -------------------------------------------------------------------------------- [name] [name] ([id number]) [id number] Page 5 of 7 Patient Name: Date of Service: [name] [name], [name] [name] 12/15/2022 10:00 A M Medical Record Number: [id number] Patient Account Number: [id number] Date of Birth/Sex: Treating RN: 1961-07-18 (62 y.o. [name] [name]) Yevonne Pax Primary Care Provider: SYSTEM, PCP Other Clinician: Referring Provider: Treating Provider/Extender: Kaylyn Layer in Treatment: 7 Subjective Chief Complaint Information obtained from Patient Bilateral Foot Ulcers History of Present Illness (HPI) 10-26-2022 upon evaluation today patient presents for initial inspection here in our clinic concerning issues that he has been having with wounds over the right dorsal foot and left distal toe on the dorsal surface more so. With that being said he has had an MRI of the right foot which showed no signs of osteomyelitis I see no x-rays nor MRI of the left foot at all at this point. Nonetheless the left foot actually is the one that appears there could be evidence of infection in the toe that is the 1 I am most concerned about. He is also going to require debridement of both locations I discussed that with him today. The patient's MRI was actually on  10-22-2022 which showed cellulitis of  the right foot but no osteomyelitis. Has been on cefadroxil which was prescribed on 10-25-2022. With that being said it is appearing to me that the patient likely has a infection in regard to the second toe left foot where his other wound is and this is the one that has been most concerned. He actually has bone exposed I am going to perform debridement I want to obtain samples as well to send for pathology and culture. Patient has a past medical history which is significant for diabetes mellitus type 2 with his most recent hemoglobin A1c being 5.1. 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 He also has amputation of the right second toe. The patient also has significant past medical history for chronic venous hypertension. Patient's ABIs were performed on 10-07-2022 showed a right ABI of 1.03 in the left ABI of 1.10. 3/7; this was a patient who is new to our clinic last week. He has a wound on his left second toe anteriorly and the right dorsal foot. Earlier this week I received the culture report and organism responsible for the infection. I was not even sure how did identify this. The bone culture from last week showed osteomyelitis on the left second toe.. The area on the right dorsal foot looks a lot better no evidence of infection 11-24-2022 upon evaluation today patient appears to be doing poorly in regard to his left second toe although the first toe right foot actually looks better in my opinion. I did review his pathology and culture as well and it showed bone necrosis but no definitive osteomyelitis. The culture nonetheless did show with the bone there was bacteria isolated. He has been on the Bactrim although that was for 2 weeks from Dr. Leanord Hawking this is the first time I am seeing him since that point. Honestly I am not certain if we should continue that or if he needs something different I did  actually order an x-ray unfortunately there was confusion and in the end we did not end up getting the x-ray. This was on the left foot and interned it was presumed that this was done but what was actually sent which is the x-ray of the right foot which is not what we needed. He has had an x-ray and MRI of the right foot neither of the left foot. For that reason I would have to send him today for the left foot x-ray. 12-06-2021 upon evaluation today patient appears to be doing about the same in regard to his toe ulcer. We have subsequently up to this point been treating him for infection. We do have confirmation based on what we see currently with the MRI that he still is continuing to have issues right now with the infection of his toes. There is actually 2 locations 1 that has a wound 1 that does not. With that being said he has been on the Bactrim since 12 February. I think that that has helped to some degree to keep this under control but not fully. He still is taking that at this point and I am actually can renew that today for him to continue the plan. He is in agreement with that plan. Nonetheless he is going to require some additional help to get this close I do believe that I believe for limb salvage it would benefit him to proceed with HBO therapy. We have discussed this previously and loose  detail now in greater detail today. 12-15-2022 upon evaluation today patient appears to be doing well currently in regard to his wound. He has been tolerating the dressing changes without complication. Fortunately there does not appear to be any signs of active infection systemically which is great news. Objective Constitutional Well-nourished and well-hydrated in no acute distress. Vitals Time Taken: 9:51 AM, Height: 76 in, Weight: 232 lbs, BMI: 28.2, Temperature: 97.8 F, Pulse: 102 bpm, Respiratory Rate: 18 breaths/min, Blood Pressure: 153/71 mmHg. Respiratory normal breathing without  difficulty. Psychiatric this patient is able to make decisions and demonstrates good insight into disease process. Alert and Oriented x 3. pleasant and cooperative. General Notes: Upon inspection patient appears to be doing well. He does not show any signs of systemic infection which is good he actually is doing well with his blood sugar today and I think he should be able to get an hyperbarics which is good we need to try to get this healed as quickly as possible. Integumentary (Hair, Skin) Wound #1 status is Open. Original cause of wound was Pressure Injury. The date acquired was: 10/12/2022. The wound has been in treatment 7 weeks. The wound is located on the Left,Anterior T Second. The wound measures 0.6cm length x 0.9cm width x 0.1cm depth; 0.424cm^2 area and 0.042cm^3 volume. oe There is Fat Layer (Subcutaneous Tissue) exposed. There is no tunneling or undermining noted. There is a medium amount of serosanguineous drainage noted. There is large (67-100%) red, pink granulation within the wound bed. There is no necrotic tissue within the wound bed. 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 Home Health: [facility] for wound care. May utilize formulary equivalent dressing for wound treatment orders unless otherwise specified. Home Health Nurse may visit PRN to address  patientoos wound care needs. Aldine Contes [phone] Scheduled days for dressing changes to be completed; exception, patient has scheduled wound care visit that day. - pt seen weekly in wound clinic for dressing change, patient seen daily for HBO treatments. PLEASE schedule visits for wound dressing changes accordingly Bathing/ Shower/ Hygiene: May shower; gently cleanse wound with antibacterial soap, rinse and pat dry prior to dressing wounds Anesthetic (Use 'Patient Medications' Section for Anesthetic Order Entry): Lidocaine applied to wound bed Edema Control - Lymphedema / Segmental Compressive Device / Other: Elevate, Exercise Daily and Avoid Standing for Long Periods of Time. Elevate legs to the level of the heart and pump ankles as often as possible Elevate leg(s) parallel to the floor when sitting. 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 Finger stick Blood Glucose Pre- and Post- HBOT Treatment. Follow Hyperbaric Oxygen [name] [name] Protocol WOUND #1: - T Second Wound Laterality: Left, Anterior  oe Cleanser: Soap and Water 3 x Per Week/30 Days Discharge Instructions: Gently cleanse wound with antibacterial soap, rinse and pat dry prior to dressing wounds Prim Dressing: Silvercel Small 2x2 (in/in) 3 x Per Week/30 Days ary Discharge Instructions: Apply Silvercel Small 2x2 (in/in) as instructed Secondary Dressing: Gauze 3 x Per Week/30 Days Secured With: Medipore T - 67M Medipore H Soft Cloth Surgical T ape ape, 2x2 (in/yd) 3 x Per Week/30 Days 1. I would recommend currently that we have the  patient continue to monitor for any signs of worsening of infection. Obviously right now we will get him into hyperbarics and he is just getting started with this but I am hopeful we will be able to get this wound healed. 2. I am going to suggest as well he continue with the silver cell this needs to be changed regularly working to get in touch with home health on that. We will see patient back for reevaluation in 1 week here in the clinic. If anything worsens or changes patient will contact our office for additional recommendations. Electronic Signature(s) Signed: 12/15/2022 10:27:19 AM By: [name] PA-C Previous Signature: 12/15/2022 10:23:41 AM Version By: [name] PA-C Previous Signature: 12/15/2022 10:19:03 AM Version By: [name] PA-C Entered By: [name] on 12/15/2022 10:27:19 -------------------------------------------------------------------------------- SuperBill Details Patient Name: Date of Service: [name] Sells RNEY L. 12/15/2022 Medical Record Number: [id number] Patient Account Number: [id number] Date of Birth/Sex: Treating RN: Jan 16, 1961 (62 y.o. 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.512 Non-pressure chronic ulcer of other part of right foot with fat layer exposed M14.672 Charcot's joint, left ankle and foot 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 I87.323 Chronic venous hypertension (idiopathic) with inflammation of bilateral lower extremity F17.218 Nicotine dependence, cigarettes, with other nicotine-induced  disorders Facility Procedures : CPT4 Code: 21308657 Description: 84696 - WOUND CARE VISIT-LEV 2 EST PT Modifier: Quantity: 1 Physician Procedures : CPT4 Code Description Modifier [id number] - WC PHYS LEVEL 3 - EST PT ICD-10 Diagnosis Description M86.372 Chronic multifocal osteomyelitis, left ankle and foot E11.621 Type 2 diabetes mellitus with foot ulcer L97.524 Non-pressure chronic ulcer of  other part of left foot with necrosis of bone L97.512 Non-pressure chronic ulcer of other part of right foot with fat layer exposed Quantity: 1 Electronic Signature(s) Signed: 12/15/2022 10:19:30 AM By: [name] PA-C Entered By: [name] on 12/15/2022 10:19:29

## 2022-12-15 NOTE — Progress Notes (Signed)
JENNINGS, [name] [name] ([id number]) [id number] Page 1 of 8 Visit Report for 12/15/2022 Arrival Information Details Patient Name: Date of Service: [name] [name], Cravens [place] RNEY L. 12/15/2022 10:00 A M Medical Record Number: [id number] Patient Account Number: [id number] Date of Birth/Sex: Treating RN: 1961/07/03 (62 y.o. [name]) Yevonne Pax Primary Care Berry Gallacher: SYSTEM, PCP Other Clinician: Referring Ashwath Lasch: Treating Olney Monier/Extender: Kaylyn Layer in Treatment: 7 Visit Information History Since Last Visit Added or deleted any medications: No Patient Arrived: Ambulatory Any new allergies or adverse reactions: No Arrival Time: 09:47 Had a fall or experienced change in No Accompanied By: self activities of daily living that may affect Transfer Assistance: None risk of falls: Patient Identification Verified: Yes Signs or symptoms of abuse/neglect since last visito No Secondary Verification Process Completed: Yes Hospitalized since last visit: No Patient Requires Transmission-Based Precautions: No Implantable device outside of the clinic excluding No Patient Has Alerts: Yes cellular tissue based products placed in the center Patient Alerts: Diabetes Type 2 since last visit: R ABI 1.03 L 1.10 Has Dressing in Place as Prescribed: Yes Pain Present Now: No Electronic Signature(s) Signed: 12/15/2022 11:42:53 AM By: Yevonne Pax RN Entered By: Yevonne Pax on 12/15/2022 09:51:36 -------------------------------------------------------------------------------- Clinic Level of Care Assessment Details Patient Name: Date of Service: [name] [name], [name] [name] 12/15/2022 10:00 A M Medical Record Number: [id number] Patient Account Number: [id number] Date of Birth/Sex: Treating RN: 07/20/1961 (62 y.o. [name]) Yevonne Pax Primary Care Verlin Uher: SYSTEM, PCP Other Clinician: Referring Nikcole Eischeid: Treating Tishawna Larouche/Extender: Kaylyn Layer in Treatment: 7 Clinic  Level of Care Assessment Items TOOL 4 Quantity Score X- 1 0 Use when only an EandM is performed on FOLLOW-UP visit ASSESSMENTS - Nursing Assessment / Reassessment X- 1 10 Reassessment of Co-morbidities (includes updates in patient status) X- 1 5 Reassessment of Adherence to Treatment Plan ASSESSMENTS - Wound and Skin A ssessment / Reassessment X - Simple Wound Assessment / Reassessment - one wound 1 5 []  - 0 Complex Wound Assessment / Reassessment - multiple wounds []  - 0 Dermatologic / Skin Assessment (not related to wound area) ASSESSMENTS - Focused Assessment []  - 0 Circumferential Edema Measurements - multi extremities []  - 0 Nutritional Assessment / Counseling / Intervention []  - 0 Lower Extremity Assessment (monofilament, tuning fork, pulses) []  - 0 Peripheral Arterial Disease Assessment (using hand held doppler) ASSESSMENTS - Ostomy and/or Continence Assessment and Care []  - 0 Incontinence Assessment and Management []  - 0 Ostomy Care Assessment and Management (repouching, etc.) PROCESS - Coordination of Care X - Simple Patient / Family Education for ongoing care 1 15 QUASHON, JESUS ([id number]) [id number] Page 2 of 8 []  - 0 Complex (extensive) Patient / Family Education for ongoing care []  - 0 Staff obtains Chiropractor, Records, T Results / Process Orders est []  - 0 Staff telephones HHA, Nursing Homes / Clarify orders / etc []  - 0 Routine Transfer to another Facility (non-emergent condition) []  - 0 Routine Hospital Admission (non-emergent condition) []  - 0 New Admissions / Manufacturing engineer / Ordering NPWT Apligraf, etc. , []  - 0 Emergency Hospital Admission (emergent condition) X- 1 10 Simple Discharge Coordination []  - 0 Complex (extensive) Discharge Coordination PROCESS - Special Needs []  - 0 Pediatric / Minor Patient Management []  - 0 Isolation Patient Management []  - 0 Hearing / Language / Visual special needs []  -  0 Assessment of Community assistance (transportation, D/C planning, etc.) []  - 0 Additional assistance / Altered mentation []  - 0 Support Surface(s) Assessment (bed, cushion, seat, etc.)  INTERVENTIONS - Wound Cleansing / Measurement X - Simple Wound Cleansing - one wound 1 5  - 0 Complex Wound Cleansing - multiple wounds X- 1 5 Wound Imaging (photographs - any number of wounds)  - 0 Wound Tracing (instead of photographs) X- 1 5 Simple Wound Measurement - one wound  - 0 Complex Wound Measurement - multiple wounds INTERVENTIONS - Wound Dressings X - Small Wound Dressing one or multiple wounds 1 10  - 0 Medium Wound Dressing one or multiple wounds  - 0 Large Wound Dressing one or multiple wounds  - 0 Application of Medications - topical  - 0 Application of Medications - injection INTERVENTIONS - Miscellaneous  - 0 External ear exam  - 0 Specimen Collection (cultures, biopsies, blood, body fluids, etc.)  - 0 Specimen(s) / Culture(s) sent or taken to Lab for analysis  - 0 Patient Transfer (multiple staff / Nurse, adult / Similar devices)  - 0 Simple Staple / Suture removal (25 or less)  - 0 Complex Staple / Suture removal (26 or more)  - 0 Hypo / Hyperglycemic Management (close monitor of Blood Glucose)  - 0 Ankle / Brachial Index (ABI) - do not check if billed separately X- 1 5 Vital Signs Has the patient been seen at the hospital within the last three years: Yes Total Score: 75 Level Of Care: New/Established - Level 2 Electronic Signature(s) Signed: 12/15/2022 11:42:53 AM By: Yevonne Pax RN Entered By: Yevonne Pax on 12/15/2022 10:12:22 [name] ([id number]) [id number] Page 3 of 8 -------------------------------------------------------------------------------- Encounter Discharge Information Details Patient Name: Date of Service: [name] [name], [name] [name] 12/15/2022 10:00 A M Medical Record Number:  [id number] Patient Account Number: [id number] Date of Birth/Sex: Treating RN: Jan 16, 1961 (62 y.o. [name]) Yevonne Pax Primary Care Christne Platts: SYSTEM, PCP Other Clinician: Referring Nychelle Cassata: Treating Jazzma Neidhardt/Extender: Kaylyn Layer in Treatment: 7 Encounter Discharge Information Items Discharge Condition: Stable Ambulatory Status: Ambulatory Discharge Destination: Home Transportation: Private Auto Accompanied By: self Schedule Follow-up Appointment: Yes Clinical Summary of Care: Electronic Signature(s) Signed: 12/15/2022 11:42:53 AM By: Yevonne Pax RN Entered By: Yevonne Pax on 12/15/2022 10:13:49 -------------------------------------------------------------------------------- Lower Extremity Assessment Details Patient Name: Date of Service: BROOKS, KINNAN RNEY L. 12/15/2022 10:00 A M Medical Record Number: [id number] Patient Account Number: [id number] Date of Birth/Sex: Treating RN: 03-03-1961 (62 y.o. [name]) Yevonne Pax Primary Care Preciliano Castell: SYSTEM, PCP Other Clinician: Referring Hiedi Touchton: Treating Aalijah Mims/Extender: Maryellen Pile Weeks in Treatment: 7 Vascular Assessment Pulses: Dorsalis Pedis Palpable: [Right:Yes] Electronic Signature(s) Signed: 12/15/2022 11:42:53 AM By: Yevonne Pax RN Entered By: Yevonne Pax on 12/15/2022 09:56:11 -------------------------------------------------------------------------------- Multi Wound Chart Details Patient Name: Date of Service: [name] 12/15/2022 10:00 A M Medical Record Number: [id number] Patient Account Number: [id number] Date of Birth/Sex: Treating RN: 1961/05/27 (62 y.o. [name]) Yevonne Pax Primary Care Duval Macleod: SYSTEM, PCP Other Clinician: Referring Exander Shaul: Treating Chanise Habeck/Extender: Kaylyn Layer in Treatment: 7 Vital Signs Height(in): 76 Pulse(bpm): 102 Weight(lbs): 232 Blood Pressure(mmHg): 153/71 Body Mass Index(BMI): 28.2 Temperature(F):  97.8 Respiratory Rate(breaths/min): 18 [1:Photos:] [N/A:N/A] Left, Anterior T Second oe N/A N/A Wound Location: Pressure Injury N/A N/A Wounding Event: Diabetic Wound/Ulcer of the Lower N/A N/A Primary Etiology: Extremity Peripheral Venous Disease, Type II N/A N/A Comorbid History: Diabetes, Osteomyelitis 10/12/2022 N/A N/A Date Acquired: 7 N/A N/A Weeks of Treatment: Open N/A N/A Wound Status: No N/A N/A Wound Recurrence: Yes N/A N/A Pending A mputation on Presentation: 0.5x0.7x0.1 N/A N/A Measurements L x W x D (cm) 0.275 N/A  N/A A (cm) : rea 0.027 N/A N/A Volume (cm) : 71.10% N/A N/A % Reduction in A rea: 94.30% N/A N/A % Reduction in Volume: Grade 3 N/A N/A Classification: Medium N/A N/A Exudate A mount: Serosanguineous N/A N/A Exudate Type: red, brown N/A N/A Exudate Color: Large (67-100%) N/A N/A Granulation A mount: Red, Pink N/A N/A Granulation Quality: None Present (0%) N/A N/A Necrotic A mount: Fat Layer (Subcutaneous Tissue): Yes N/A N/A Exposed Structures: Fascia: No Tendon: No Muscle: No Joint: No Bone: No None N/A N/A Epithelialization: Treatment Notes Electronic Signature(s) Signed: 12/15/2022 11:42:53 AM By: Yevonne Pax RN Entered By: Yevonne Pax on 12/15/2022 09:56:16 -------------------------------------------------------------------------------- Multi-Disciplinary Care Plan Details Patient Name: Date of Service: [name] 12/15/2022 10:00 A M Medical Record Number: [id number] Patient Account Number: [id number] Date of Birth/Sex: Treating RN: November 28, 1960 (62 y.o. [name]) Yevonne Pax Primary Care Drishti Pepperman: SYSTEM, PCP Other Clinician: Referring Givanni Staron: Treating Toby Ayad/Extender: Kaylyn Layer in Treatment: 7 Active Inactive Necrotic Tissue Nursing Diagnoses: Impaired tissue integrity related to necrotic/devitalized tissue Knowledge deficit related to management of necrotic/devitalized  tissue Goals: Necrotic/devitalized tissue will be minimized in the wound bed Date Initiated: 10/26/2022 Target Resolution Date: 12/25/2022 Goal Status: Active Patient/caregiver will verbalize understanding of reason and process for debridement of necrotic tissue Date Initiated: 10/26/2022 Date Inactivated: 11/24/2022 Target Resolution Date: 11/24/2022 Goal Status: Met Interventions: Assess patient pain level pre-, during and post procedure and prior to discharge Provide education on necrotic tissue and debridement process RANDY, CASTREJON ([id number]) [id number] Page 5 of 8 Treatment Activities: Excisional debridement : 10/26/2022 T ordered outside of clinic : 10/26/2022 est Notes: Wound/Skin Impairment Nursing Diagnoses: Impaired tissue integrity Knowledge deficit related to smoking impact on wound healing Knowledge deficit related to ulceration/compromised skin integrity Goals: Patient will demonstrate a reduced rate of smoking or cessation of smoking Date Initiated: 10/26/2022 Date Inactivated: 11/24/2022 Target Resolution Date: 11/24/2022 Goal Status: Met Patient will have a decrease in wound volume by X% from date: (specify in notes) Date Initiated: 10/26/2022 Target Resolution Date: 11/24/2022 Goal Status: Active Patient/caregiver will verbalize understanding of skin care regimen Date Initiated: 10/26/2022 Target Resolution Date: 12/25/2022 Goal Status: Active Ulcer/skin breakdown will have a volume reduction of 30% by week 4 Date Initiated: 10/26/2022 Date Inactivated: 11/24/2022 Target Resolution Date: 11/24/2022 Goal Status: Unmet Unmet Reason: comoridities Ulcer/skin breakdown will have a volume reduction of 50% by week 8 Date Initiated: 10/26/2022 Target Resolution Date: 12/25/2022 Goal Status: Active Ulcer/skin breakdown will have a volume reduction of 80% by week 12 Date Initiated: 10/26/2022 Target Resolution Date: 01/24/2023 Goal Status:  Active Ulcer/skin breakdown will heal within 14 weeks Date Initiated: 10/26/2022 Target Resolution Date: 02/07/2023 Goal Status: Active Interventions: Assess patient/caregiver ability to obtain necessary supplies Assess patient/caregiver ability to perform ulcer/skin care regimen upon admission and as needed Assess ulceration(s) every visit Provide education on smoking Provide education on ulcer and skin care Treatment Activities: Skin care regimen initiated : 10/26/2022 Smoking cessation education : 10/26/2022 Notes: Electronic Signature(s) Signed: 12/15/2022 11:42:53 AM By: Yevonne Pax RN Entered By: Yevonne Pax on 12/15/2022 09:56:46 -------------------------------------------------------------------------------- Pain Assessment Details Patient Name: Date of Service: [name] [name], [name] RNEY L. 12/15/2022 10:00 A M Medical Record Number: [id number] Patient Account Number: [id number] Date of Birth/Sex: Treating RN: 1961/03/27 (62 y.o. Melonie [place] Primary Care Fe Okubo: SYSTEM, PCP Other Clinician: Referring Goldie Tregoning: Treating Ottis Sarnowski/Extender: Kaylyn Layer in Treatment: 7 Active Problems Location of Pain Severity and Description of Pain Patient Has Paino No Site  Locations [place] ([id number]) [id number] Page 6 of 8 Pain Management and Medication Current Pain Management: Electronic Signature(s) Signed: 12/15/2022 11:42:53 AM By: Yevonne Pax RN Entered By: Yevonne Pax on 12/15/2022 09:52:50 -------------------------------------------------------------------------------- Patient/Caregiver Education Details Patient Name: Date of Service: Ashley Jacobs 4/19/2024andnbsp10:00 A M Medical Record Number: [id number] Patient Account Number: [id number] Date of Birth/Gender: Treating RN: 1961-04-11 (62 y.o. [name]) Yevonne Pax Primary Care Physician: SYSTEM, PCP Other Clinician: Referring Physician: Treating Physician/Extender:  Kaylyn Layer in Treatment: 7 Education Assessment Education Provided To: Patient Education Topics Provided Wound Debridement: Handouts: Wound Debridement Methods: Explain/Verbal Responses: State content correctly Electronic Signature(s) Signed: 12/15/2022 11:42:53 AM By: Yevonne Pax RN Entered By: Yevonne Pax on 12/15/2022 09:57:22 -------------------------------------------------------------------------------- Wound Assessment Details Patient Name: Date of Service: [name] 12/15/2022 10:00 A M Medical Record Number: [id number] Patient Account Number: [id number] Date of Birth/Sex: Treating RN: 1961-04-09 (62 y.o. [name]) Yevonne Pax Primary Care Nga Rabon: SYSTEM, PCP Other Clinician: Referring Daphna Lafuente: Treating Joella Saefong/Extender: Kaylyn Layer in Treatment: 7 Wound Status Wound Number: 1 Primary Etiology: Diabetic Wound/Ulcer of the Lower Extremity Wound Location: Left, Anterior T Second oe Wound Status: Open Wounding Event: Pressure Injury Comorbid Peripheral Venous Disease, Type II Diabetes, History: Osteomyelitis Date Acquired: 10/12/2022 Weeks Of Treatment: 7 KAMARIUS, BUCKBEE ([id number]) [id number] Page 7 of 8 Clustered Wound: No Pending Amputation On Presentation Photos Wound Measurements Length: (cm) 0.6 Width: (cm) 0.9 Depth: (cm) 0.1 Area: (cm) 0.424 Volume: (cm) 0.042 % Reduction in Area: 55.4% % Reduction in Volume: 91.2% Epithelialization: None Tunneling: No Undermining: No Wound Description Classification: Grade 3 Exudate Amount: Medium Exudate Type: Serosanguineous Exudate Color: red, brown Foul Odor After Cleansing: No Slough/Fibrino Yes Wound Bed Granulation Amount: Large (67-100%) Exposed Structure Granulation Quality: Red, Pink Fascia Exposed: No Necrotic Amount: None Present (0%) Fat Layer (Subcutaneous Tissue) Exposed: Yes Tendon Exposed: No Muscle Exposed:  No Joint Exposed: No Bone Exposed: No Treatment Notes Wound #1 (Toe Second) Wound Laterality: Left, Anterior Cleanser Soap and Water Discharge Instruction: Gently cleanse wound with antibacterial soap, rinse and pat dry prior to dressing wounds Peri-Wound Care Topical Primary Dressing Silvercel Small 2x2 (in/in) Discharge Instruction: Apply Silvercel Small 2x2 (in/in) as instructed Secondary Dressing Gauze Secured With Medipore T - 73M Medipore H Soft Cloth Surgical T ape ape, 2x2 (in/yd) Compression Wrap Compression Stockings Add-Ons Electronic Signature(s) Signed: 12/15/2022 11:42:53 AM By: Yevonne Pax RN Entered By: Yevonne Pax on 12/15/2022 10:12:59 Vitals Details -------------------------------------------------------------------------------- [name] ([id number]) [id number] Page 8 of 8 Patient Name: Date of Service: [name] [name], [name] RNEY L. 12/15/2022 10:00 A M Medical Record Number: [id number] Patient Account Number: [id number] Date of Birth/Sex: Treating RN: Jun 18, 1961 (62 y.o. [name]) Yevonne Pax Primary Care Jamice Carreno: SYSTEM, PCP Other Clinician: Referring Ausha Sieh: Treating Tillmon Kisling/Extender: Kaylyn Layer in Treatment: 7 Vital Signs Time Taken: 09:51 Temperature (F): 97.8 Height (in): 76 Pulse (bpm): 102 Weight (lbs): 232 Respiratory Rate (breaths/min): 18 Body Mass Index (BMI): 28.2 Blood Pressure (mmHg): 153/71 Reference Range: 80 - 120 mg / dl Electronic Signature(s) Signed: 12/15/2022 11:42:53 AM By: Yevonne Pax RN Entered By: Yevonne Pax on 12/15/2022 09:52:44

## 2022-12-18 ENCOUNTER — Encounter: Payer: Medicare Other | Admitting: Physician Assistant

## 2022-12-18 DIAGNOSIS — E11621 Type 2 diabetes mellitus with foot ulcer: Secondary | ICD-10-CM | POA: Diagnosis not present

## 2022-12-18 LAB — GLUCOSE, CAPILLARY
Glucose-Capillary: 105 mg/dL — ABNORMAL HIGH (ref 70–99)
Glucose-Capillary: 195 mg/dL — ABNORMAL HIGH (ref 70–99)
Glucose-Capillary: 102 mg/dL — ABNORMAL HIGH (ref 70–99)

## 2022-12-19 ENCOUNTER — Encounter: Payer: Medicare Other | Admitting: Physician Assistant

## 2022-12-19 DIAGNOSIS — E11621 Type 2 diabetes mellitus with foot ulcer: Secondary | ICD-10-CM | POA: Diagnosis not present

## 2022-12-19 LAB — GLUCOSE, CAPILLARY
Glucose-Capillary: 154 mg/dL — ABNORMAL HIGH (ref 70–99)
Glucose-Capillary: 117 mg/dL — ABNORMAL HIGH (ref 70–99)

## 2022-12-20 ENCOUNTER — Encounter (HOSPITAL_BASED_OUTPATIENT_CLINIC_OR_DEPARTMENT_OTHER): Payer: Medicare Other | Admitting: Internal Medicine

## 2022-12-20 DIAGNOSIS — L97524 Non-pressure chronic ulcer of other part of left foot with necrosis of bone: Secondary | ICD-10-CM | POA: Diagnosis not present

## 2022-12-20 DIAGNOSIS — M86372 Chronic multifocal osteomyelitis, left ankle and foot: Secondary | ICD-10-CM

## 2022-12-20 DIAGNOSIS — E11621 Type 2 diabetes mellitus with foot ulcer: Secondary | ICD-10-CM

## 2022-12-20 LAB — GLUCOSE, CAPILLARY
Glucose-Capillary: 145 mg/dL — ABNORMAL HIGH (ref 70–99)
Glucose-Capillary: 101 mg/dL — ABNORMAL HIGH (ref 70–99)

## 2022-12-20 NOTE — Progress Notes (Signed)
NEAMIAH, SCIARRA ([id number]) [id number] Page 1 of 2 Visit Report for 12/20/2022 HBO Details Patient Name: Date of Service: [name] [name], [name] [name] 12/20/2022 10:30 A M Medical Record Number: [id number] Patient Account Number: [id number] Date of Birth/Sex: Treating RN: Jan 06, 1961 (62 y.o. 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 HBO Treatment Details Treatment Number: 5 Patient Type: Outpatient Chamber Type: Monoplace Chamber Serial #: [id number] Treatment Protocol: 2.0 ATA with 90 minutes oxygen, and no air breaks Treatment Details Compression Rate Down: 1.5 psi / minute De-Compression Rate Up: 1.5 psi / minute Air breaks and breathing Decompress Decompress Compress Tx Pressure Begins Reached periods Begins Ends (leave unused spaces blank) Chamber Pressure (ATA 1 2 ------2 1 ) Clock Time (24 hr) 11:20 11:32 - - - - - - 13:03 13:14 Treatment Length: 114 (minutes) Treatment Segments: 4 Vital Signs Capillary Blood Glucose Reference Range: 80 - 120 mg / dl HBO Diabetic Blood Glucose Intervention Range: <131 mg/dl or >756 mg/dl Type: Time Vitals Blood Respiratory Capillary Blood Glucose Pulse Action Pulse: Temperature: Taken: Pressure: Rate: Glucose (mg/dl): Meter #: Oximetry (%) Taken: Pre 11:05 142/78 78 18 97.9 145 treatment initiated Post 13:27 138/78 80 18 98.3 101 discharged home Treatment Response Treatment Toleration: Well Treatment Completion Status: Treatment Completed without Adverse Event HBO Attestation I certify that I supervised this HBO treatment in accordance with Medicare guidelines. A trained  emergency response team is readily available per Yes hospital policies and procedures. Continue HBOT as ordered. Yes Electronic Signature(s) Signed: 12/20/2022 3:43:42 PM By: Geralyn Corwin DO Previous Signature: 12/20/2022 1:24:24 PM Version By: Elliot Gurney [name], RN, CWS, [name], [name] Previous Signature: 12/20/2022 1:13:36 PM Version By: Elliot Gurney [name], RN, CWS, [name], [name] Previous Signature: 12/20/2022 11:35:40 AM Version By: Elliot Gurney, [name], RN, CWS, [name], [name] Previous Signature: 12/20/2022 11:32:57 AM Version By: Elliot Gurney, [name], RN, CWS, [name], [name] Previous Signature: 12/20/2022 11:24:26 AM Version By: Elliot Gurney, [name], RN, CWS, [name], [name] Entered By: Geralyn Corwin on 12/20/2022 15:43:23 -------------------------------------------------------------------------------- HBO Safety Checklist Details Patient Name: Date of Service: [name] [name], [name] RNEY L. 12/20/2022 10:30 A M Medical Record Number: [id number] Patient Account Number: [id number] [name] [name], [name] [name] ([id number]) [id number] Page 2 of 2 Date of Birth/Sex: Treating RN: September 01, 1960 (62 y.o. [name], [name] Primary Care Tiffany Talarico: Other Clinician: SYSTEM, PCP Referring Starsha Morning: Treating Kenedi Cilia/Extender: Mallie Snooks in Treatment: 7 HBO Safety Checklist Items Safety Checklist Consent Form Signed Patient voided / foley secured and emptied When did you last eato am Last dose of injectable or oral agent am Ostomy pouch emptied and vented if applicable NA All implantable devices assessed, documented and approved NA Intravenous access site secured and place NA Valuables secured Linens and cotton and cotton/polyester blend (less than 51% polyester) Personal oil-based products / skin lotions / body lotions removed Wigs or hairpieces removed NA Smoking or tobacco materials removed NA Books / newspapers / magazines / loose paper removed NA Cologne, aftershave, perfume and deodorant removed NA Jewelry  removed (may wrap wedding band) NA Make-up removed NA Hair care products removed NA Battery operated devices (external) removed NA Heating patches and chemical warmers removed NA Titanium eyewear removed NA Nail polish cured greater than 10 hours NA Casting material cured greater than  10 hours NA Hearing aids removed NA Loose dentures or partials removed NA Prosthetics have been removed NA Patient demonstrates correct use of air break device (if applicable) Patient concerns have been addressed Patient grounding bracelet on and cord attached to chamber Specifics for Inpatients (complete in addition to above) Medication sheet sent with patient Intravenous medications needed or due during therapy sent with patient Drainage tubes (e.g. nasogastric tube or chest tube secured and vented) Endotracheal or Tracheotomy tube secured Cuff deflated of air and inflated with saline Airway suctioned Electronic Signature(s) Signed: 12/20/2022 11:23:54 AM By: Elliot Gurney, [name], RN, CWS, [name], [name] Entered By: Elliot Gurney, [name], RN, CWS, Kim on 12/20/2022 11:23:54

## 2022-12-21 ENCOUNTER — Encounter: Payer: Medicare Other | Admitting: Physician Assistant

## 2022-12-21 DIAGNOSIS — E11621 Type 2 diabetes mellitus with foot ulcer: Secondary | ICD-10-CM | POA: Diagnosis not present

## 2022-12-21 LAB — GLUCOSE, CAPILLARY
Glucose-Capillary: 152 mg/dL — ABNORMAL HIGH (ref 70–99)
Glucose-Capillary: 97 mg/dL (ref 70–99)
Glucose-Capillary: 130 mg/dL — ABNORMAL HIGH (ref 70–99)

## 2022-12-21 NOTE — Progress Notes (Signed)
EVERHETT, BOZARD ([id number]) [id number] Page 1 of 2 Visit Report for 12/20/2022 Arrival Information Details Patient Name: Date of Service: [name] [name], [name] [name] 12/20/2022 10:30 A M Medical Record Number: [id number] Patient Account Number: [id number] Date of Birth/Sex: Treating RN: Oct 10, 1960 (62 y.o. [name] [name] Primary Care Mirinda Monte: SYSTEM, PCP Other Clinician: Referring Sahaj Bona: Treating Braxston Quinter/Extender: Mallie Snooks in Treatment: 7 Visit Information History Since Last Visit Pain Present Now: No Patient Arrived: Ambulatory Arrival Time: 11:21 Accompanied By: self Transfer Assistance: None Patient Identification Verified: Yes Secondary Verification Process Completed: Yes Patient Requires Transmission-Based Precautions: No Patient Has Alerts: Yes Patient Alerts: Diabetes Type 2 R ABI 1.03 L 1.10 Electronic Signature(s) Signed: 12/20/2022 11:22:05 AM By: Elliot Gurney, [name], RN, CWS, [name], [name] Entered By: Elliot Gurney, [name], RN, CWS, Kim on 12/20/2022 11:22:04 -------------------------------------------------------------------------------- Encounter Discharge Information Details Patient Name: Date of Service: [name] Sells RNEY L. 12/20/2022 10:30 A M Medical Record Number: [id number] Patient Account Number: [id number] Date of Birth/Sex: Treating RN: 05-10-1961 (62 y.o. [name] [name] Primary Care Liddy Deam: SYSTEM, PCP Other Clinician: Referring Rosamond Andress: Treating Tynasia Mccaul/Extender: Mallie Snooks in Treatment: 7 Encounter Discharge Information Items Discharge Condition: Stable Ambulatory Status: Ambulatory Discharge Destination: Home Transportation: Private Auto Accompanied By: self Schedule Follow-up Appointment: Yes Clinical Summary of Care: Electronic Signature(s) Signed: 12/20/2022 1:25:53 PM By: Elliot Gurney, [name], RN, CWS, [name], [name] Entered By: Elliot Gurney, [name], RN, CWS, Kim on 12/20/2022  13:25:52 -------------------------------------------------------------------------------- Vitals Details Patient Name: Date of Service: [name] Sells RNEY L. 12/20/2022 10:30 A M Medical Record Number: [id number] Patient Account Number: [id number] Date of Birth/Sex: Treating RN: [name] [name] (62 y.o. [name] [name] Primary Care Gaylin Osoria: SYSTEM, PCP Other Clinician: Referring Justis Dupas: Treating Vendetta Pittinger/Extender: Mallie Snooks in Treatment: 7 Vital Signs Time Taken: 11:05 Temperature (F): 97.9 Height (in): 76 Pulse (bpm): 78 Weight (lbs): 232 Respiratory Rate (breaths/min): 18 Body Mass Index (BMI): 28.2 Blood Pressure (mmHg): 142/78 [name], [name] [id number] Page 2 of 2 Capillary Blood Glucose (mg/dl): 433 Reference Range: 80 - 120 mg / dl Electronic Signature(s) Signed: 12/20/2022 11:22:46 AM By: Elliot Gurney, [name], RN, CWS, [name], [name] Entered By: Elliot Gurney, [name], RN, CWS, Kim on 12/20/2022 11:22:45

## 2022-12-22 ENCOUNTER — Encounter: Payer: Medicare Other | Admitting: Physician Assistant

## 2022-12-22 DIAGNOSIS — E11621 Type 2 diabetes mellitus with foot ulcer: Secondary | ICD-10-CM | POA: Diagnosis not present

## 2022-12-22 LAB — GLUCOSE, CAPILLARY
Glucose-Capillary: 95 mg/dL (ref 70–99)
Glucose-Capillary: 104 mg/dL — ABNORMAL HIGH (ref 70–99)

## 2022-12-22 NOTE — Progress Notes (Addendum)
AUREL, NGUYEN ([id number]) [id number] Page 1 of 8 Visit Report for 12/21/2022 Chief Complaint Document Details Patient Name: Date of Service: [name] [name], [name] [name] 12/21/2022 9:45 A M Medical Record Number: [id number] Patient Account Number: [id number] Date of Birth/Sex: Treating RN: Jun 19, 1961 (62 y.o. Laymond Purser Primary Care Provider: SYSTEM, PCP Other Clinician: Angelina Pih Referring Provider: Treating Provider/Extender: Kaylyn Layer in Treatment: 8 Information Obtained from: Patient Chief Complaint Bilateral Foot Ulcers Electronic Signature(s) Signed: 12/21/2022 10:22:11 AM By: [name] PA-C Entered By: [name] on 12/21/2022 10:22:11 -------------------------------------------------------------------------------- Debridement Details Patient Name: Date of Service: [name] [name] RNEY L. 12/21/2022 9:45 A M Medical Record Number: [id number] Patient Account Number: [id number] Date of Birth/Sex: Treating RN: 1961-07-05 (62 y.o. Laymond Purser Primary Care Provider: SYSTEM, PCP Other Clinician: Angelina Pih Referring Provider: Treating Provider/Extender: Kaylyn Layer in Treatment: 8 Debridement Performed for Assessment: Wound #1 Left,Anterior T Second oe Performed By: Physician [name], PA-C Debridement Type: Debridement Severity of Tissue Pre Debridement: Fat layer exposed Level of Consciousness (Pre-procedure): Awake and Alert Pre-procedure Verification/Time Out Yes - 10:24 Taken: Pain Control: Lidocaine 4% T opical Solution Percent of Wound Bed Debrided: 100% T Area Debrided (cm): otal 0.42 Tissue and other material debrided: Viable, Non-Viable, Bone, Slough, Subcutaneous, Skin: Dermis , Skin: Epidermis, Slough Level: Skin/Subcutaneous Tissue/Muscle/Bone Debridement Description: Excisional Instrument: Curette Bleeding: Moderate Hemostasis Achieved: Pressure Response to  Treatment: Procedure was tolerated well Level of Consciousness (Post- Awake and Alert procedure): Post Debridement Measurements of Total Wound Length: (cm) 0.6 Width: (cm) 0.9 Depth: (cm) 0.3 Volume: (cm) 0.127 Character of Wound/Ulcer Post Debridement: Stable Severity of Tissue Post Debridement: Fat layer exposed Post Procedure Diagnosis Same as Pre-procedure Electronic Signature(s) Signed: 12/21/2022 4:36:23 PM By: Angelina Pih Signed: 12/21/2022 5:47:58 PM By: [name] PA-C Entered By: Angelina Pih on 12/21/2022 10:38:01 [name] ([id number]) [id number] Page 2 of 8 -------------------------------------------------------------------------------- HPI Details Patient Name: Date of Service: [name] [name], [name] [name] 12/21/2022 9:45 A M Medical Record Number: [id number] Patient Account Number: [id number] Date of Birth/Sex: Treating RN: 01-15-1961 (62 y.o. Laymond Purser Primary Care Provider: SYSTEM, PCP Other Clinician: Angelina Pih Referring Provider: Treating Provider/Extender: Kaylyn Layer in Treatment: 8 History of Present Illness HPI Description: 10-26-2022 upon evaluation today patient presents for initial inspection here in our clinic concerning issues that he has been having with wounds over the right dorsal foot and left distal toe on the dorsal surface more so. With that being said he has had an MRI of the right foot which showed no signs of osteomyelitis I see no x-rays nor MRI of the left foot at all at this point. Nonetheless the left foot actually is the one that appears there could be evidence of infection in the toe that is the 1 I am most concerned about. He is also going to require debridement of both locations I discussed that with him today. The patient's MRI was actually on 10-22-2022 which showed cellulitis of the right foot but no osteomyelitis. Has been on cefadroxil which was prescribed on  10-25-2022. With that being said it is appearing to me that the patient likely has a infection in regard to the second toe left foot where his other wound is and this is the one that has been most concerned. He actually has bone exposed I am going to perform debridement I want to obtain samples as well to send for pathology and culture. Patient has a past medical  history which is significant for diabetes mellitus type 2 with his most recent hemoglobin A1c being 5.1. 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 He also has amputation of the right second toe. The patient also has significant past medical history for chronic venous hypertension. Patient's ABIs were performed on 10-07-2022 showed a right ABI of 1.03 in the left ABI of 1.10. 3/7; this was a patient who is new to our clinic last week. He has a wound on his left second toe anteriorly and the right dorsal foot. Earlier this week I received the culture report and organism responsible for the infection. I was not even sure how did identify this. The bone culture from last week showed osteomyelitis on the left second toe.. The area on the right dorsal foot looks a lot better no evidence of infection 11-24-2022 upon evaluation today patient appears to be doing poorly in regard to his left second toe although the first toe right foot actually looks better in my opinion. I did review his pathology and culture as well and it showed bone necrosis but no definitive osteomyelitis. The culture nonetheless did show with the bone there was bacteria isolated. He has been on the Bactrim although that was for 2 weeks from Dr. Leanord Hawking this is the first time I am seeing him since that point. Honestly I am not certain if we should continue that or if he needs something different I did actually order an x-ray unfortunately there was confusion and in the end we did not end up getting the x-ray. This was on the  left foot and interned it was presumed that this was done but what was actually sent which is the x-ray of the right foot which is not what we needed. He has had an x-ray and MRI of the right foot neither of the left foot. For that reason I would have to send him today for the left foot x-ray. 12-06-2021 upon evaluation today patient appears to be doing about the same in regard to his toe ulcer. We have subsequently up to this point been treating him for infection. We do have confirmation based on what we see currently with the MRI that he still is continuing to have issues right now with the infection of his toes. There is actually 2 locations 1 that has a wound 1 that does not. With that being said he has been on the Bactrim since 12 February. I think that that has helped to some degree to keep this under control but not fully. He still is taking that at this point and I am actually can renew that today for him to continue the plan. He is in agreement with that plan. Nonetheless he is going to require some additional help to get this close I do believe that I believe for limb salvage it would benefit him to proceed with HBO therapy. We have discussed this previously and loose detail now in greater detail today. 12-15-2022 upon evaluation today patient appears to be doing well currently in regard to his wound. He has been tolerating the dressing changes without complication. Fortunately there does not appear to be any signs of active infection systemically which is great news. 12-21-2022 upon evaluation today patient appears to be doing well currently in regard to his toe ulcer though this is going require some debridement today. Fortunately I do not see any signs of active infection  locally nor systemically which is great news. Electronic Signature(s) Signed: 12/21/2022 5:26:55 PM By: [name] PA-C Entered By: [name] on 12/21/2022  17:26:55 -------------------------------------------------------------------------------- Physical Exam Details Patient Name: Date of Service: [name] [name], [name] [name] RNEY L. 12/21/2022 9:45 A M Medical Record Number: [id number] Patient Account Number: [id number] Date of Birth/Sex: Treating RN: April 05, 1961 (62 y.o. Laymond Purser Primary Care Provider: SYSTEM, PCP Other Clinician: Angelina Pih Referring Provider: Treating Provider/Extender: Kaylyn Layer in Treatment: 8 Constitutional Well-nourished and well-hydrated in no acute distress. Respiratory normal breathing without difficulty. Psychiatric this patient is able to make decisions and demonstrates good insight into disease process. Alert and Oriented x 3. pleasant and cooperative. KORTNEY, SCHOENFELDER ([id number]) [id number] Page 3 of 8 Notes Upon inspection patient's wound bed actually showed signs of good granulation and epithelization at this point. I do believe that he is making good progress between the hyperbarics, oral antibiotics, and good wound care and I think that he is showing improvement. I did not perform debridement today this included removal some necrotic bone which she tolerated without complication and postdebridement the wound bed is significantly improved which is great news. Electronic Signature(s) Signed: 12/21/2022 5:27:23 PM By: [name] PA-C Entered By: [name] on 12/21/2022 17:27:23 -------------------------------------------------------------------------------- Physician Orders Details Patient Name: Date of Service: [name] [name] RNEY L. 12/21/2022 9:45 A M Medical Record Number: [id number] Patient Account Number: [id number] Date of Birth/Sex: Treating RN: October 14, 1960 (62 y.o. Laymond Purser Primary Care Provider: SYSTEM, PCP Other Clinician: Angelina Pih Referring Provider: Treating Provider/Extender: Kaylyn Layer in Treatment:  8 Verbal / Phone Orders: No Diagnosis Coding ICD-10 Coding Code Description [phone] Chronic multifocal osteomyelitis, left ankle and foot E11.621 Type 2 diabetes mellitus with foot ulcer L97.524 Non-pressure chronic ulcer of other part of left foot with necrosis of bone L97.512 Non-pressure chronic ulcer of other part of right foot with fat layer exposed M14.672 Charcot's joint, left ankle and foot 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 I87.323 Chronic venous hypertension (idiopathic) with inflammation of bilateral lower extremity F17.218 Nicotine dependence, cigarettes, with other nicotine-induced disorders Follow-up Appointments Return Appointment in 1 week. Home Health [facility] for wound care. May utilize formulary equivalent dressing for wound treatment orders unless otherwise specified. Home Health Nurse may visit PRN to address patients wound care needs. Aldine Contes [phone] Scheduled days for dressing changes to be completed; exception, patient has scheduled wound care visit that day. - pt seen weekly in wound clinic for dressing change, patient seen daily for HBO treatments. PLEASE schedule visits for wound dressing changes accordingly Bathing/ Shower/ Hygiene May shower; gently cleanse wound with antibacterial soap, rinse and pat dry prior to dressing wounds Anesthetic (Use 'Patient Medications' Section for Anesthetic Order Entry) Lidocaine applied to wound bed Edema Control - Lymphedema / Segmental Compressive Device / Other Elevate, Exercise Daily and A void Standing for Long Periods of Time. Elevate legs to the level of the heart and pump ankles as often as possible Elevate leg(s) parallel to the floor when sitting. Additional Orders / Instructions Decrease/Stop Smoking Hyperbaric Oxygen Therapy Wound #1 Left,Anterior T Second oe Indication and location: - Wagner grade 3 left 2nd toe 2.0 ATA for 90 Minutes without A Breaks ir One treatment per day  (delivered Monday through Friday unless otherwise specified in Special Instructions below): Total # of Treatments: - 40 Antihistamine 30 minutes prior to HBO Treatment, difficulty clearing ears. Finger stick Blood Glucose Pre- and Post- HBOT Treatment. Follow Hyperbaric  Oxygen Glycemia Protocol Wound Treatment Wound #1 - T Second oe Wound Laterality: Left, Anterior Cleanser: Soap and Water 3 x Per Week/30 Days Discharge Instructions: Gently cleanse wound with antibacterial soap, rinse and pat dry prior to dressing wounds Prim Dressing: Silvercel Small 2x2 (in/in) 3 x Per Week/30 Days ary Discharge Instructions: Apply Silvercel Small 2x2 (in/in) as instructed YAVIER, SNIDER L ([id number]) [id number] Page 4 of 8 Secondary Dressing: Gauze 3 x Per Week/30 Days Secured With: Medipore T - 16M Medipore H Soft Cloth Surgical T ape ape, 2x2 (in/yd) 3 x Per Week/30 Days GLYCEMIA INTERVENTIONS PROTOCOL PRE-HBO GLYCEMIA INTERVENTIONS ACTION INTERVENTION Obtain pre-HBO capillary blood glucose (ensure 1 physician order is in chart). A. Notify HBO physician and await physician orders. 2 If result is 70 mg/dl or below: B. If the result meets the hospital definition of a critical result, follow hospital policy. A. Give patient an 8 ounce Glucerna Shake, an 8 ounce Ensure, or 8 ounces of a Glucerna/Ensure equivalent dietary supplement*. B. Wait 30 minutes. If result is 71 mg/dl to 841 mg/dl: C. Retest patients capillary blood glucose (CBG). D. If result greater than or equal to 110 mg/dl, proceed with HBO. If result less than 110 mg/dl, notify HBO physician and consider holding HBO. If result is 131 mg/dl to 324 mg/dl: A. Proceed with HBO. A. Notify HBO physician and await physician orders. B. It is recommended to hold HBO and do If result is 250 mg/dl or greater: blood/urine ketone testing. C. If the result meets the hospital definition of a critical result,  follow hospital policy. POST-HBO GLYCEMIA INTERVENTIONS ACTION INTERVENTION Obtain post HBO capillary blood glucose (ensure 1 physician order is in chart). A. Notify HBO physician and await physician orders. 2 If result is 70 mg/dl or below: B. If the result meets the hospital definition of a critical result, follow hospital policy. A. Give patient an 8 ounce Glucerna Shake, an 8 ounce Ensure, or 8 ounces of a Glucerna/Ensure equivalent dietary supplement*. B. Wait 15 minutes for symptoms of If result is 71 mg/dl to 401 mg/dl: hypoglycemia (i.e. nervousness, anxiety, sweating, chills, clamminess, irritability, confusion, tachycardia or dizziness). C. If patient asymptomatic, discharge patient. If patient symptomatic, repeat capillary blood glucose (CBG) and notify HBO physician. If result is 101 mg/dl to 027 mg/dl: A. Discharge patient. A. Notify HBO physician and await physician orders. B. It is recommended to do blood/urine ketone If result is 250 mg/dl or greater: testing. C. If the result meets the hospital definition of a critical result, follow hospital policy. *Juice or candies are NOT equivalent products. If patient refuses the Glucerna or Ensure, please consult the hospital dietitian for an appropriate substitute. Electronic Signature(s) Signed: 12/21/2022 4:36:23 PM By: Angelina Pih Signed: 12/21/2022 5:47:58 PM By: [name] PA-C Entered By: Angelina Pih on 12/21/2022 10:37:39 -------------------------------------------------------------------------------- Problem List Details Patient Name: Date of Service: [name] [name] RNEY L. 12/21/2022 9:45 A M Medical Record Number: [id number] Patient Account Number: [id number] Date of Birth/Sex: Treating RN: 09-23-1960 (62 y.o. Laymond Purser Primary Care Provider: SYSTEM, PCP Other Clinician: Angelina Pih Referring Provider: Treating Provider/Extender: Kaylyn Layer in Treatment:  [address] [name] [name], [name] [name] ([id number]) [id number] Page 5 of 8 ICD-10 Encounter Code Description Active Date MDM Diagnosis M86.372 Chronic multifocal osteomyelitis, left ankle and foot 12/07/2022 No Yes E11.621 Type 2 diabetes mellitus with foot ulcer 10/26/2022 No Yes L97.524 Non-pressure chronic ulcer of other part of left foot with necrosis of bone 10/26/2022 No Yes L97.512  Non-pressure chronic ulcer of other part of right foot with fat layer exposed 10/26/2022 No Yes M14.672 Charcot's joint, left ankle and foot 10/26/2022 No Yes 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 10/26/2022 No Yes I87.323 Chronic venous hypertension (idiopathic) with inflammation of bilateral lower 10/26/2022 No Yes extremity F17.218 Nicotine dependence, cigarettes, with other nicotine-induced disorders 10/26/2022 No Yes Inactive Problems Resolved Problems Electronic Signature(s) Signed: 12/21/2022 10:21:56 AM By: [name] PA-C Entered By: [name] on 12/21/2022 10:21:56 -------------------------------------------------------------------------------- Progress Note Details Patient Name: Date of Service: [name] [name] RNEY L. 12/21/2022 9:45 A M Medical Record Number: [id number] Patient Account Number: [id number] Date of Birth/Sex: Treating RN: [birth year] (62 y.o. Laymond Purser Primary Care Provider: SYSTEM, PCP Other Clinician: Angelina Pih Referring Provider: Treating Provider/Extender: Kaylyn Layer in Treatment: 8 Subjective Chief Complaint Information obtained from Patient Bilateral Foot Ulcers History of Present Illness (HPI) 10-26-2022 upon evaluation today patient presents for initial inspection here in our clinic concerning issues that he has been having with wounds over the right dorsal foot and left distal toe on the dorsal surface more so. With that being said he has had an MRI of the right foot which showed no signs of osteomyelitis  I see no x-rays nor MRI of the left foot at all at this point. Nonetheless the left foot actually is the one that appears there could be evidence of infection in the toe that is the 1 I am most concerned about. He is also going to require debridement of both locations I discussed that with him today. The patient's MRI was actually on 10-22-2022 which showed cellulitis of the right foot but no osteomyelitis. Has been on cefadroxil which was prescribed on 10-25-2022. With that being said it is appearing to me that the patient likely has a infection in regard to the second toe left foot where his other wound is and this is the one that has been most concerned. He actually has bone exposed I am going to perform debridement I want to obtain samples as well to send for pathology and culture. Patient has a past medical history which is significant for diabetes mellitus type 2 with his most recent hemoglobin A1c being 5.1. 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 He also has amputation of the right second toe. The patient also has significant past medical history for chronic venous hypertension. FINNIAN, HUSTED ([id number]) [id number] Page 6 of 8 Patient's ABIs were performed on 10-07-2022 showed a right ABI of 1.03 in the left ABI of 1.10. 3/7; this was a patient who is new to our clinic last week. He has a wound on his left second toe anteriorly and the right dorsal foot. Earlier this week I received the culture report and organism responsible for the infection. I was not even sure how did identify this. The bone culture from last week showed osteomyelitis on the left second toe.. The area on the right dorsal foot looks a lot better no evidence of infection 11-24-2022 upon evaluation today patient appears to be doing poorly in regard to his left second toe although the first toe right foot actually looks better in  my opinion. I did review his pathology and culture as well and it showed bone necrosis but no definitive osteomyelitis. The culture nonetheless did show with the bone there was bacteria isolated. He has been on the Bactrim although that was for  2 weeks from Dr. Leanord Hawking this is the first time I am seeing him since that point. Honestly I am not certain if we should continue that or if he needs something different I did actually order an x-ray unfortunately there was confusion and in the end we did not end up getting the x-ray. This was on the left foot and interned it was presumed that this was done but what was actually sent which is the x-ray of the right foot which is not what we needed. He has had an x-ray and MRI of the right foot neither of the left foot. For that reason I would have to send him today for the left foot x-ray. 12-06-2021 upon evaluation today patient appears to be doing about the same in regard to his toe ulcer. We have subsequently up to this point been treating him for infection. We do have confirmation based on what we see currently with the MRI that he still is continuing to have issues right now with the infection of his toes. There is actually 2 locations 1 that has a wound 1 that does not. With that being said he has been on the Bactrim since 12 February. I think that that has helped to some degree to keep this under control but not fully. He still is taking that at this point and I am actually can renew that today for him to continue the plan. He is in agreement with that plan. Nonetheless he is going to require some additional help to get this close I do believe that I believe for limb salvage it would benefit him to proceed with HBO therapy. We have discussed this previously and loose detail now in greater detail today. 12-15-2022 upon evaluation today patient appears to be doing well currently in regard to his wound. He has been tolerating the dressing changes  without complication. Fortunately there does not appear to be any signs of active infection systemically which is great news. 12-21-2022 upon evaluation today patient appears to be doing well currently in regard to his toe ulcer though this is going require some debridement today. Fortunately I do not see any signs of active infection locally nor systemically which is great news. Objective Constitutional Well-nourished and well-hydrated in no acute distress. Vitals Time Taken: 10:09 AM, Height: 76 in, Weight: 232 lbs, BMI: 28.2, Temperature: 97.7 F, Pulse: 91 bpm, Respiratory Rate: 18 breaths/min, Blood Pressure: 139/89 mmHg, Capillary Blood Glucose: 97 mg/dl, Pulse Oximetry: 99 %. Respiratory normal breathing without difficulty. Psychiatric this patient is able to make decisions and demonstrates good insight into disease process. Alert and Oriented x 3. pleasant and cooperative. General Notes: Upon inspection patient's wound bed actually showed signs of good granulation and epithelization at this point. I do believe that he is making good progress between the hyperbarics, oral antibiotics, and good wound care and I think that he is showing improvement. I did not perform debridement today this included removal some necrotic bone which she tolerated without complication and postdebridement the wound bed is significantly improved which is great news. Integumentary (Hair, Skin) Wound #1 status is Open. Original cause of wound was Pressure Injury. The date acquired was: 10/12/2022. The wound has been in treatment 8 weeks. The wound is located on the Left,Anterior T Second. The wound measures 0.6cm length x 0.9cm width x 0.1cm depth; 0.424cm^2 area and 0.042cm^3 volume. oe There is Fat Layer (Subcutaneous Tissue) exposed. There is no tunneling or undermining noted. There is a medium amount  of serosanguineous drainage noted. There is large (67-100%) red, pink granulation within the wound bed. There  is no necrotic tissue within the wound bed. 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 There was a Excisional Skin/Subcutaneous Tissue/Muscle/Bone Debridement with a total area of 0.42 sq cm performed by [name], PA-C. With the following instrument(s): Curette to remove Viable and Non-Viable tissue/material. Material removed includes Bone,Subcutaneous Tissue, Slough, Skin: Dermis, and Skin: Epidermis after achieving pain control using Lidocaine 4% T opical Solution. No specimens were taken. A time out was conducted at 10:24, prior to the start of the procedure. A Moderate amount of bleeding was controlled with Pressure. The procedure was tolerated well. Post Debridement Measurements: 0.6cm length x 0.9cm width x 0.3cm depth; 0.127cm^3 volume. Character of Wound/Ulcer Post Debridement is stable. Severity of Tissue Post Debridement is: Fat layer exposed. Post procedure Diagnosis Wound #1: Same as Pre-Procedure Plan Follow-up Appointments: Return Appointment in 1 week. Home Health: [facility] for wound care. May utilize formulary equivalent dressing for wound treatment orders unless otherwise specified. Home Health Nurse may visit PRN  to address patientoos wound care needs. Aldine Contes [phone] Scheduled days for dressing changes to be completed; exception, patient has scheduled wound care visit that day. - pt seen weekly in wound clinic for dressing change, patient seen daily for HBO treatments. PLEASE schedule visits for wound dressing changes accordingly Bathing/ Shower/ Hygiene: May shower; gently cleanse wound with antibacterial soap, rinse and pat dry prior to dressing wounds Anesthetic (Use 'Patient Medications' Section for Anesthetic Order Entry): Lidocaine applied to wound bed Edema Control - Lymphedema / Segmental Compressive Device / Other: Elevate, Exercise Daily and Avoid Standing for Long Periods of Time. Elevate legs to the level of the heart and pump ankles as often as possible Elevate leg(s) parallel to the floor when sitting. 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 Finger stick Blood Glucose Pre- and Post- HBOT Treatment. Follow Hyperbaric Oxygen Glycemia Protocol WOUND #1: - T Second Wound Laterality: Left, Anterior oe Cleanser: Soap and Water 3 x Per Week/30 Days Discharge Instructions: Gently cleanse wound with antibacterial soap, rinse and pat dry prior to dressing wounds Prim Dressing: Silvercel Small 2x2 (in/in) 3 x Per Week/30 Days ary Discharge Instructions: Apply Silvercel Small 2x2 (in/in) as instructed Secondary Dressing: Gauze 3 x Per Week/30 Days Secured With: Medipore T - 16M Medipore H Soft Cloth Surgical T ape ape, 2x2 (in/yd) 3 x Per Week/30 Days 1. I would recommend currently that we have  the patient continue to monitor  for any signs of infection or worsening. Based on what I am seeing I do believe that we are moving in the right direction here. 2. I am also going to recommend that the patient continue to use the silver cell followed by gauze to secure in place and tape. 3. He should also continue with the hyperbaric oxygen therapy which honestly I think is doing really well for him. We will see patient back for reevaluation in 1 week here in the clinic. If anything worsens or changes patient will contact our office for additional recommendations. Electronic Signature(s) Signed: 12/21/2022 5:27:48 PM By: [name] PA-C Entered By: [name] on 12/21/2022 17:27:47 -------------------------------------------------------------------------------- SuperBill Details Patient Name: Date of Service: [name] [name] RNEY L. 12/21/2022 Medical Record Number: [id number] Patient Account Number: [id number] Date of Birth/Sex: Treating RN: 1961/02/13 (62 y.o. 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 I87.323 Chronic venous hypertension (idiopathic) with inflammation of bilateral lower extremity F17.218 Nicotine dependence, cigarettes, with other nicotine-induced disorders Facility Procedures : CPT4 Code: 66440347 Description: 11044 - DEB BONE 20 SQ CM/<  ICD-10 Diagnosis Description L97.524 Non-pressure chronic ulcer of other part of left foot with necrosis of bon Modifier: e Quantity: 1 Physician Procedures : CPT4: Description Modifier Code [id number] Debridement; bone (includes epidermis, dermis, subQ tissue, muscle and/or fascia, if performed) 1st 20 sqcm or less ICD-10 Diagnosis Description L97.524 Non-pressure chronic ulcer of other part of left foot with  necrosis of bone Quantity: 1 Electronic Signature(s) Signed: 12/21/2022 5:29:04 PM By: [name] PA-C Entered By: [name] on 12/21/2022 17:29:04

## 2022-12-25 ENCOUNTER — Encounter: Payer: Medicare Other | Admitting: Physician Assistant

## 2022-12-26 ENCOUNTER — Encounter: Payer: Medicare Other | Admitting: Physician Assistant

## 2022-12-26 DIAGNOSIS — E11621 Type 2 diabetes mellitus with foot ulcer: Secondary | ICD-10-CM | POA: Diagnosis not present

## 2022-12-26 LAB — GLUCOSE, CAPILLARY
Glucose-Capillary: 100 mg/dL — ABNORMAL HIGH (ref 70–99)
Glucose-Capillary: 173 mg/dL — ABNORMAL HIGH (ref 70–99)
Glucose-Capillary: 159 mg/dL — ABNORMAL HIGH (ref 70–99)

## 2022-12-27 ENCOUNTER — Encounter: Payer: Medicare Other | Admitting: Internal Medicine

## 2022-12-28 ENCOUNTER — Encounter: Payer: Medicare Other | Attending: Physician Assistant | Admitting: Physician Assistant

## 2022-12-28 ENCOUNTER — Encounter: Payer: Medicare Other | Admitting: Physician Assistant

## 2022-12-28 DIAGNOSIS — I87313 Chronic venous hypertension (idiopathic) with ulcer of bilateral lower extremity: Secondary | ICD-10-CM | POA: Insufficient documentation

## 2022-12-28 DIAGNOSIS — M14672 Charcot's joint, left ankle and foot: Secondary | ICD-10-CM | POA: Insufficient documentation

## 2022-12-28 DIAGNOSIS — L97524 Non-pressure chronic ulcer of other part of left foot with necrosis of bone: Secondary | ICD-10-CM | POA: Insufficient documentation

## 2022-12-28 DIAGNOSIS — Z89421 Acquired absence of other right toe(s): Secondary | ICD-10-CM | POA: Insufficient documentation

## 2022-12-28 DIAGNOSIS — Z89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: Secondary | ICD-10-CM | POA: Insufficient documentation

## 2022-12-28 DIAGNOSIS — F17218 Nicotine dependence, cigarettes, with other nicotine-induced disorders: Secondary | ICD-10-CM | POA: Insufficient documentation

## 2022-12-28 DIAGNOSIS — L97512 Non-pressure chronic ulcer of other part of right foot with fat layer exposed: Secondary | ICD-10-CM | POA: Insufficient documentation

## 2022-12-28 DIAGNOSIS — M86372 Chronic multifocal osteomyelitis, left ankle and foot: Secondary | ICD-10-CM | POA: Insufficient documentation

## 2022-12-28 DIAGNOSIS — E11621 Type 2 diabetes mellitus with foot ulcer: Secondary | ICD-10-CM | POA: Insufficient documentation

## 2022-12-28 DIAGNOSIS — E1169 Type 2 diabetes mellitus with other specified complication: Secondary | ICD-10-CM | POA: Insufficient documentation

## 2022-12-28 NOTE — Progress Notes (Signed)
[name] [name], [name] [name] ([id number]) [id number] Page 1 of 2 Visit Report for 12/26/2022 HBO Details Patient Name: Date of Service: [name] [name], [name] [name] 12/26/2022 10:30 A M Medical Record Number: [id number] Patient Account Number: [id number] Date of Birth/Sex: Treating RN: Dec 20, 1960 (62 y.o. 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 HBO Treatment Details Treatment Number: 7 Patient Type: Outpatient Chamber Type: Monoplace Chamber Serial #: [id number] Treatment Protocol: 2.0 ATA with 90 minutes oxygen, and no air breaks Treatment Details Compression Rate Down: 2.0 psi / minute De-Compression Rate Up: 1.5 psi / minute Air breaks and breathing Decompress Decompress Compress Tx Pressure Begins Reached periods Begins Ends (leave unused spaces blank) Chamber Pressure (ATA 1 2 ------2 1 ) Clock Time (24 hr) 10:52 11:03 - - - - - - 11:33 11:44 Treatment Length: 52 (minutes) Treatment Segments: 2 Vital Signs Capillary Blood Glucose Reference Range: 80 - 120 mg / dl HBO Diabetic Blood Glucose Intervention Range: <131 mg/dl or >324 mg/dl Type: Time Vitals Blood Pulse: Respiratory Temperature: Capillary Blood Glucose Pulse Action Taken: Pressure: Rate: Glucose (mg/dl): Meter #: Oximetry (%) Taken: Pre 10:52 124/80 112 18 98 97 99 2 ensures, crackers, and peanut butter Post 11:44 122/80 100 18 97.9 173 1 99 none per protocol Treatment Response Treatment Toleration: Well Treatment Completion Status: Treatment Aborted/Not Restarted Reason: Patient Choice Treatment Notes Pt complained of severe intense need to have a bowel movement and requested to  be immediate removed from chamber. [name] [name] notified and agreed. Electronic Signature(s) Signed: 12/26/2022 6:18:39 PM By: [name] Derry PA-C Signed: 12/27/2022 9:47:58 AM By: [name] [name] By: [name] [name] on 12/26/2022 15:07:51 -------------------------------------------------------------------------------- HBO Safety Checklist Details Patient Name: Date of Service: [name] [name] RNEY L. 12/26/2022 10:30 A M Medical Record Number: [id number] Patient Account Number: [id number] Date of Birth/Sex: Treating RN: 1961-04-21 (62 y.o. M) Primary Care [name] [name]: SYSTEM, PCP Other Clinician: Referring [name] [name]: Treating [name] [name]/Extender: [name] [name] in Treatment: [address] [place] L ([id number]) [id number] Page 2 of 2 HBO Safety Checklist Items Safety Checklist Consent Form Signed Patient voided / foley secured and emptied When did you last eato 12/26/2022 am in clinic Last dose of injectable or oral agent n/a Ostomy pouch emptied and vented if applicable NA All implantable devices assessed, documented and approved NA Intravenous access site secured and place NA Valuables secured Linens and cotton and cotton/polyester blend (less than 51% polyester) Personal oil-based products / skin lotions / body lotions removed Wigs or hairpieces removed NA Smoking or tobacco materials removed Books / newspapers / magazines / loose paper removed Cologne, aftershave, perfume and deodorant removed Jewelry removed (may wrap wedding band) Make-up removed NA Hair care products removed Battery operated devices (external) removed NA Heating patches and chemical warmers removed NA Titanium eyewear removed NA Nail polish cured greater than 10 hours NA Casting material cured greater than 10 hours NA Hearing aids removed NA Loose dentures or partials removed NA Prosthetics have been removed NA Patient demonstrates correct use of air break device (if  applicable) Patient concerns have been addressed Patient grounding bracelet on and cord attached to chamber Specifics for Inpatients (complete in addition to above) Medication sheet sent with patient Intravenous medications needed or due during therapy sent with patient Drainage tubes (e.g. nasogastric  tube or chest tube secured and vented) Endotracheal or Tracheotomy tube secured Cuff deflated of air and inflated with saline Airway suctioned Electronic Signature(s) Signed: 12/27/2022 9:47:58 AM By: [name] [name] Entered By: [name] [name] on 12/26/2022 14:58:39

## 2022-12-28 NOTE — Progress Notes (Signed)
LAMBERT, JEANTY ([id number]) [id number] Page 1 of 4 Visit Report for 12/22/2022 Arrival Information Details Patient Name: Date of Service: DAE, HIGHLEY 12/22/2022 10:30 A M Medical Record Number: [id number] Patient Account Number: [id number] Date of Birth/Sex: Treating RN: Aug 20, 1961 (62 y.o. M) Primary Care Fleta Borgeson: SYSTEM, PCP Other Clinician: Referring Crew Goren: Treating Herta Hink/Extender: Kaylyn Layer in Treatment: 8 Visit Information History Since Last Visit Added or deleted any medications: No Patient Arrived: Ambulatory Had a fall or experienced change in No Arrival Time: 11:30 activities of daily living that may affect Accompanied By: self risk of falls: Transfer Assistance: None Signs or symptoms of abuse/neglect since last visito No Patient Requires Transmission-Based Precautions: No Hospitalized since last visit: No Patient Has Alerts: Yes Implantable device outside of the clinic excluding No Patient Alerts: Diabetes Type 2 cellular tissue based products placed in the center R ABI 1.03 L 1.10 since last visit: Pain Present Now: No Electronic Signature(s) Signed: 12/27/2022 9:47:58 AM By: Demetria Pore Entered By: Demetria Pore on 12/26/2022 14:47:27 -------------------------------------------------------------------------------- Clinic Level of Care Assessment Details Patient Name: Date of Service: JARAMIE, BASTOS 12/22/2022 10:30 A M Medical Record Number: [id number] Patient Account Number: [id number] Date of Birth/Sex: Treating RN: [birth year] (62 y.o. [name] Primary Care Jehad Bisono: SYSTEM, PCP Other Clinician: Referring Ferrell Flam: Treating Desteny Freeman/Extender: Kaylyn Layer in Treatment: 8 Clinic Level of Care Assessment Items TOOL 4 Quantity Score []  - 0 Use when only an EandM is performed on FOLLOW-UP visit ASSESSMENTS - Nursing Assessment / Reassessment []  - 0 Reassessment of  Co-morbidities (includes updates in patient status) []  - 0 Reassessment of Adherence to Treatment Plan ASSESSMENTS - Wound and Skin A ssessment / Reassessment []  - 0 Simple Wound Assessment / Reassessment - one wound []  - 0 Complex Wound Assessment / Reassessment - multiple wounds []  - 0 Dermatologic / Skin Assessment (not related to wound area) ASSESSMENTS - Focused Assessment []  - 0 Circumferential Edema Measurements - multi extremities []  - 0 Nutritional Assessment / Counseling / Intervention []  - 0 Lower Extremity Assessment (monofilament, tuning fork, pulses) []  - 0 Peripheral Arterial Disease Assessment (using hand held doppler) ASSESSMENTS - Ostomy and/or Continence Assessment and Care []  - 0 Incontinence Assessment and Management []  - 0 Ostomy Care Assessment and Management (repouching, etc.) PROCESS - Coordination of Care []  - 0 Simple Patient / Family Education for ongoing care []  - 0 Complex (extensive) Patient / Family Education for ongoing care HAVISH, PETTIES ([id number]) [id number] Page 2 of 4 X- 1 10 Staff obtains Consents, Records, T Results / Process Orders est []  - 0 Staff telephones HHA, Nursing Homes / Clarify orders / etc []  - 0 Routine Transfer to another Facility (non-emergent condition) []  - 0 Routine Hospital Admission (non-emergent condition) []  - 0 New Admissions / Manufacturing engineer / Ordering NPWT Apligraf, etc. , []  - 0 Emergency Hospital Admission (emergent condition) []  - 0 Simple Discharge Coordination []  - 0 Complex (extensive) Discharge Coordination PROCESS - Special Needs []  - 0 Pediatric / Minor Patient Management []  - 0 Isolation Patient Management []  - 0 Hearing / Language / Visual special needs []  - 0 Assessment of Community assistance (transportation, D/C planning, etc.) []  - 0 Additional assistance / Altered mentation []  - 0 Support Surface(s) Assessment (bed, cushion, seat,  etc.) INTERVENTIONS - Wound Cleansing / Measurement []  - 0 Simple Wound Cleansing - one wound []  - 0 Complex Wound Cleansing - multiple wounds []  - 0 Wound  Imaging (photographs - any number of wounds) []  - 0 Wound Tracing (instead of photographs) []  - 0 Simple Wound Measurement - one wound []  - 0 Complex Wound Measurement - multiple wounds INTERVENTIONS - Wound Dressings []  - 0 Small Wound Dressing one or multiple wounds []  - 0 Medium Wound Dressing one or multiple wounds []  - 0 Large Wound Dressing one or multiple wounds []  - 0 Application of Medications - topical []  - 0 Application of Medications - injection INTERVENTIONS - Miscellaneous []  - 0 External ear exam []  - 0 Specimen Collection (cultures, biopsies, blood, body fluids, etc.) []  - 0 Specimen(s) / Culture(s) sent or taken to Lab for analysis []  - 0 Patient Transfer (multiple staff / Nurse, adult / Similar devices) []  - 0 Simple Staple / Suture removal (25 or less) []  - 0 Complex Staple / Suture removal (26 or more) []  - 0 Hypo / Hyperglycemic Management (close monitor of Blood Glucose) []  - 0 Ankle / Brachial Index (ABI) - do not check if billed separately X- 1 5 Vital Signs Has the patient been seen at the hospital within the last three years: Yes Total Score: 15 Level Of Care: New/Established - Level 1 Electronic Signature(s) Signed: 12/27/2022 11:09:32 AM By: Elliot Gurney, [name], RN, CWS, [name], [name] Entered By: Elliot Gurney, [name], RN, CWS, Kim on 12/27/2022 09:44:15 [name] ([id number]) [id number] Page 3 of 4 -------------------------------------------------------------------------------- Encounter Discharge Information Details Patient Name: Date of Service: ONIS, MARKOFF 12/22/2022 10:30 A M Medical Record Number: [id number] Patient Account Number: [id number] Date of Birth/Sex: Treating RN: 03/19/1961 (62 y.o. M) Primary Care Artice Holohan: SYSTEM, PCP Other Clinician: Referring  Janicia Monterrosa: Treating Karenna Romanoff/Extender: Kaylyn Layer in Treatment: 8 Encounter Discharge Information Items Discharge Condition: Stable Ambulatory Status: Ambulatory Discharge Destination: Home Transportation: Private Auto Accompanied By: self Schedule Follow-up Appointment: Yes Clinical Summary of Care: Electronic Signature(s) Signed: 12/27/2022 9:47:58 AM By: Francoise Ceo By: Demetria Pore on 12/22/2022 12:46:27 -------------------------------------------------------------------------------- General Visit Notes Details Patient Name: Date of Service: [name] 12/22/2022 10:30 A M Medical Record Number: [id number] Patient Account Number: [id number] Date of Birth/Sex: Treating RN: May 17, 1961 (62 y.o. M) Primary Care Demitrious Mccannon: SYSTEM, PCP Other Clinician: Referring Tilford Deaton: Treating Vitoria Conyer/Extender: Kaylyn Layer in Treatment: 8 Notes Patient glucose was taken and result was low. Per protocol give patient ensure and crackers, wait 30 min take again. Jaiana Sheffer Notified. Patients glucose was taken again results to low to dive per Nazaire Cordial. Patient agreed. Appointment changed to a nurse visit. Electronic Signature(s) Signed: 12/27/2022 9:47:58 AM By: Demetria Pore Entered By: Demetria Pore on 12/25/2022 14:00:43 -------------------------------------------------------------------------------- Vitals Details Patient Name: Date of Service: [name] 12/22/2022 10:30 A M Medical Record Number: [id number] Patient Account Number: [id number] Date of Birth/Sex: Treating RN: [birth year] (62 y.o. M) Primary Care Marsean Elkhatib: SYSTEM, PCP Other Clinician: Referring Vilda Zollner: Treating Brendolyn Stockley/Extender: Kaylyn Layer in Treatment: 8 Vital Signs Time Taken: 10:10 Temperature (F): 98.0 Height (in): 76 Pulse (bpm): 89 Weight (lbs): 232 Respiratory Rate (breaths/min): 18 Body Mass Index (BMI): 28.2 Blood  Pressure (mmHg): 126/80 Capillary Blood Glucose (mg/dl): 95 Reference Range: 80 - 120 mg / dl Airway Pulse Oximetry (%): 99 Electronic Signature(s) Signed: 12/27/2022 9:47:58 AM By: Demetria Pore Entered By: Demetria Pore on 12/22/2022 12:44:56 [name] ([id number]) [id number] Page 4 of 4

## 2022-12-28 NOTE — Progress Notes (Signed)
AADI, BORDNER ([id number]) [id number] Page 1 of 2 Visit Report for 12/15/2022 HBO Details Patient Name: Date of Service: [name] [name], [name] [name] 12/15/2022 11:00 A M Medical Record Number: [id number] Patient Account Number: [id number] Date of Birth/Sex: Treating RN: 1960-10-24 (62 y.o. 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 HBO Treatment Details Treatment Number: 2 Patient Type: Outpatient Chamber Type: Monoplace Chamber Serial #: [id number] Treatment Protocol: 2.0 ATA with 90 minutes oxygen, and no air breaks Treatment Details Compression Rate Down: 2.0 psi / minute De-Compression Rate Up: 1.0 psi / minute Air breaks and breathing Decompress Decompress Compress Tx Pressure Begins Reached periods Begins Ends (leave unused spaces blank) Chamber Pressure (ATA 1 2 ------2 1 ) Clock Time (24 hr) 10:33 10:46 - - - - - - 12:20 12:25 Treatment Length: 112 (minutes) Treatment Segments: 4 Vital Signs Capillary Blood Glucose Reference Range: 80 - 120 mg / dl HBO Diabetic Blood Glucose Intervention Range: <131 mg/dl or >324 mg/dl Type: Time Vitals Blood Respiratory Capillary Blood Glucose Pulse Action Pulse: Temperature: Taken: Pressure: Rate: Glucose (mg/dl): Meter #: Oximetry (%) Taken: Pre 10:33 128/80 96 18 98 162 98 none per protocol Post 12:25 122/80 84 18 98.1 206 1 99 none per protocol Electronic Signature(s) Signed: 12/15/2022 1:44:50 PM By: [name] PA-C Signed: 12/27/2022 9:47:58 AM By: Demetria Pore Entered By: Demetria Pore on 12/15/2022  13:39:17 -------------------------------------------------------------------------------- HBO Safety Checklist Details Patient Name: Date of Service: [name] Sells RNEY L. 12/15/2022 11:00 A M Medical Record Number: [id number] Patient Account Number: [id number] Date of Birth/Sex: Treating RN: 30-Nov-1960 (62 y.o. M) Primary Care Bjorn Hallas: SYSTEM, PCP Other Clinician: Referring Corwyn Vora: Treating Londynn Sonoda/Extender: Kaylyn Layer in Treatment: 7 HBO Safety Checklist Items Safety Checklist Consent Form Signed Patient voided / foley secured and emptied When did you last eato 12/15/2022 am Last dose of injectable or oral agent n/a Ostomy pouch emptied and vented if applicable NA FREDIE, MAJANO L ([id number]) [id number] Page 2 of 2 All implantable devices assessed, documented and approved NA Intravenous access site secured and place NA Valuables secured Linens and cotton and cotton/polyester blend (less than 51% polyester) Personal oil-based products / skin lotions / body lotions removed Wigs or hairpieces removed NA Smoking or tobacco materials removed Books / newspapers / magazines / loose paper removed Cologne, aftershave, perfume and deodorant removed Jewelry removed (may wrap wedding band) Make-up removed NA Hair care products removed Battery operated devices (external) removed NA Heating patches and chemical warmers removed NA Titanium eyewear removed NA Nail polish cured greater than 10 hours NA Casting material cured greater than 10 hours NA Hearing aids removed NA Loose dentures or partials removed NA Prosthetics have been removed NA Patient demonstrates correct use of air break device (if applicable) Patient concerns have been addressed Patient grounding bracelet on and cord attached to chamber Specifics for Inpatients (complete in addition to above) Medication sheet sent with patient Intravenous medications needed or  due during therapy sent with patient Drainage tubes (e.g. nasogastric tube or chest tube secured and vented) Endotracheal or Tracheotomy tube secured Cuff deflated of air and inflated with saline Airway suctioned Electronic Signature(s) Signed: 12/27/2022 9:47:58 AM By: Demetria Pore Entered By: Demetria Pore on 12/15/2022 13:36:08

## 2022-12-28 NOTE — Progress Notes (Signed)
ULYSSES, ALPER ([id number]) [id number] Page 1 of 2 Visit Report for 12/18/2022 Arrival Information Details Patient Name: Date of Service: RODRIQUEZ, THORNER 12/18/2022 10:30 A M Medical Record Number: [id number] Patient Account Number: [id number] Date of Birth/Sex: Treating RN: 1960-10-21 (62 y.o. M) Primary Care Bazil Dhanani: SYSTEM, PCP Other Clinician: Referring Kaileb Monsanto: Treating Madelaine Whipple/Extender: Kaylyn Layer in Treatment: 7 Visit Information History Since Last Visit Added or deleted any medications: No Patient Arrived: Ambulatory Any new allergies or adverse reactions: No Arrival Time: 10:17 Had a fall or experienced change in No Accompanied By: self activities of daily living that may affect Transfer Assistance: None risk of falls: Patient Requires Transmission-Based Precautions: No Signs or symptoms of abuse/neglect since last visito No Patient Has Alerts: Yes Hospitalized since last visit: No Patient Alerts: Diabetes Type 2 Implantable device outside of the clinic excluding No R ABI 1.03 L 1.10 cellular tissue based products placed in the center since last visit: Pain Present Now: No Electronic Signature(s) Signed: 12/27/2022 9:47:58 AM By: Demetria Pore Entered By: Demetria Pore on 12/18/2022 12:30:05 -------------------------------------------------------------------------------- Encounter Discharge Information Details Patient Name: Date of Service: [name] 12/18/2022 10:30 A M Medical Record Number: [id number] Patient Account Number: [id number] Date of Birth/Sex: Treating RN: Nov 02, 1960 (62 y.o. M) Primary Care Akito Boomhower: SYSTEM, PCP Other Clinician: Referring Journey Ratterman: Treating Jahmya Onofrio/Extender: Kaylyn Layer in Treatment: 7 Encounter Discharge Information Items Discharge Condition: Stable Ambulatory Status: Ambulatory Discharge Destination: Home Transportation: Private  Auto Accompanied By: self Schedule Follow-up Appointment: Yes Clinical Summary of Care: Electronic Signature(s) Signed: 12/27/2022 9:47:58 AM By: Demetria Pore Entered By: Demetria Pore on 12/18/2022 12:33:12 -------------------------------------------------------------------------------- Vitals Details Patient Name: Date of Service: [name] 12/18/2022 10:30 A M Medical Record Number: [id number] Patient Account Number: [id number] Date of Birth/Sex: Treating RN: [birth year] (62 y.o. M) Primary Care Jerah Esty: SYSTEM, PCP Other Clinician: Referring Austyn Perriello: Treating Woodrow Dulski/Extender: Kaylyn Layer in Treatment: 7 Vital Signs Time Taken: 10:18 Temperature (F): 98.1 Height (in): 76 Pulse (bpm): 96 Costabile, Oshen L ([id number]) [id number] Page 2 of 2 Weight (lbs): 232 Respiratory Rate (breaths/min): 18 Body Mass Index (BMI): 28.2 Blood Pressure (mmHg): 134/80 Capillary Blood Glucose (mg/dl): 536 Reference Range: 80 - 120 mg / dl Airway Pulse Oximetry (%): 97 Electronic Signature(s) Signed: 12/27/2022 9:47:58 AM By: Demetria Pore Entered By: Demetria Pore on 12/18/2022 12:30:10

## 2022-12-28 NOTE — Progress Notes (Signed)
[name] [name] ([id number]) [id number] Page 1 of 7 Visit Report for 12/21/2022 Arrival Information Details Patient Name: Date of Service: [name], [name] 12/21/2022 9:45 A M Medical Record Number: [id number] Patient Account Number: [id number] Date of Birth/Sex: Treating RN: 22-Apr-1961 (62 y.o. [name] [name] Primary Care [name] [name]: SYSTEM, PCP Other Clinician: Angelina [name] Referring [name] [name]: Treating [name] [name]/Extender: [name] [name] in Treatment: 8 Visit Information History Since Last Visit Added or deleted any medications: No Patient Arrived: Ambulatory Any new allergies or adverse reactions: No Arrival Time: 10:09 Had a fall or experienced change in No Accompanied By: self activities of daily living that may affect Transfer Assistance: None risk of falls: Patient Identification Verified: Yes Hospitalized since last visit: No Secondary Verification Process Completed: Yes Has Dressing in Place as Prescribed: Yes Patient Requires Transmission-Based Precautions: No Pain Present Now: No Patient Has Alerts: Yes Patient Alerts: Diabetes Type 2 R ABI 1.03 [name] Electronic Signature(s) Signed: 12/27/2022 9:47:58 AM By: [name] [name] Entered By: [name] [name] on 12/21/2022 13:57:12 -------------------------------------------------------------------------------- Clinic Level of Care Assessment Details Patient Name: Date of Service: [name] [name] 12/21/2022 9:45 A M Medical Record Number: [id number] Patient Account Number: [id number] Date of Birth/Sex: Treating RN: [birth year] (62 y.o. [name] [name] Primary Care [name] [name]: SYSTEM, PCP Other Clinician: Angelina [name] Referring [name] [name]: Treating [name] [name]/Extender: [name] [name] in Treatment: 8 Clinic Level of Care Assessment Items TOOL 1 Quantity Score []  - 0 Use when EandM and Procedure is performed on INITIAL visit ASSESSMENTS - Nursing  Assessment / Reassessment []  - 0 General Physical Exam (combine w/ comprehensive assessment (listed just below) when performed on new pt. evals) []  - 0 Comprehensive Assessment (HX, ROS, Risk Assessments, Wounds Hx, etc.) ASSESSMENTS - Wound and Skin Assessment / Reassessment []  - 0 Dermatologic / Skin Assessment (not related to wound area) ASSESSMENTS - Ostomy and/or Continence Assessment and Care []  - 0 Incontinence Assessment and Management []  - 0 Ostomy Care Assessment and Management (repouching, etc.) PROCESS - Coordination of Care []  - 0 Simple Patient / Family Education for ongoing care []  - 0 Complex (extensive) Patient / Family Education for ongoing care []  - 0 Staff obtains Chiropractor, Records, T Results / Process Orders est []  - 0 Staff telephones HHA, Nursing Homes / Clarify orders / etc []  - 0 Routine Transfer to another Facility (non-emergent condition) []  - 0 Routine Hospital Admission (non-emergent condition) []  - 0 New Admissions / Insurance Authorizations / Ordering NPWT Apligraf, etc. , []  - 0 Emergency Hospital Admission (emergent condition) [name], [name] [name] ([id number]) [id number] Page 2 of 7 PROCESS - Special Needs []  - 0 Pediatric / Minor Patient Management []  - 0 Isolation Patient Management []  - 0 Hearing / Language / Visual special needs []  - 0 Assessment of Community assistance (transportation, D/C planning, etc.) []  - 0 Additional assistance / Altered mentation []  - 0 Support Surface(s) Assessment (bed, cushion, seat, etc.) INTERVENTIONS - Miscellaneous []  - 0 External ear exam []  - 0 Patient Transfer (multiple staff / Nurse, adult / Similar devices) []  - 0 Simple Staple / Suture removal (25 or less) []  - 0 Complex Staple / Suture removal (26 or more) []  - 0 Hypo/Hyperglycemic Management (do not check if billed separately) []  - 0 Ankle / Brachial Index (ABI) - do not check if billed separately Has the patient  been seen at the hospital within the last three years: Yes Total Score: 0 Level Of Care: ____ Electronic Signature(s) Signed: 12/21/2022  4:36:23 PM By: [name] [name] Entered By: [name] [name] on 12/21/2022 11:04:07 -------------------------------------------------------------------------------- Encounter Discharge Information Details Patient Name: Date of Service: [name] Sells RNEY [name] 12/21/2022 9:45 A M Medical Record Number: [id number] Patient Account Number: [id number] Date of Birth/Sex: Treating RN: [birth year] (62 y.o. [name] [name] Primary Care [name] [name]: SYSTEM, PCP Other Clinician: Angelina [name] Referring [name] [name]: Treating [name] [name]/Extender: [name] [name] in Treatment: 8 Encounter Discharge Information Items Post Procedure Vitals Discharge Condition: Stable Temperature (F): 97.7 Ambulatory Status: Ambulatory Pulse (bpm): 91 Discharge Destination: Home Respiratory Rate (breaths/min): 18 Transportation: Private Auto Blood Pressure (mmHg): 139/89 Accompanied By: self Schedule Follow-up Appointment: Yes Clinical Summary of Care: Electronic Signature(s) Signed: 12/21/2022 11:07:32 AM By: [name] [name] Entered By: [name] [name] on 12/21/2022 11:07:32 -------------------------------------------------------------------------------- Lower Extremity Assessment Details Patient Name: Date of Service: [name], [name] 12/21/2022 9:45 A M Medical Record Number: [id number] Patient Account Number: [id number] Date of Birth/Sex: Treating RN: 04/29/1961 (62 y.o. [name] [name] Primary Care [name] [name]: SYSTEM, PCP Other Clinician: Angelina [name] Referring Zayden Hahne: Treating [name] [name]/Extender: [name] [name] in Treatment: 8 Edema Assessment Assessed: [Left: No] [Right: No] Edema: [Left: N] [Right: o] W[LeftNICANOR, MENDOLIA ([id number])] [Right: [id number] Page 3 of 7] Calf Left: Right: Point of  Measurement: 37 cm From Medial Instep 36 cm Ankle Left: Right: Point of Measurement: 11 cm From Medial Instep 22.5 cm Vascular Assessment Pulses: Dorsalis Pedis Palpable: [Left:Yes] Posterior Tibial Palpable: [Left:Yes] Electronic Signature(s) Signed: 12/21/2022 4:36:23 PM By: [name] [name] Entered By: [name] [name] on 12/21/2022 10:18:06 -------------------------------------------------------------------------------- Multi Wound Chart Details Patient Name: Date of Service: [name] Sells RNEY [name] 12/21/2022 9:45 A M Medical Record Number: [id number] Patient Account Number: [id number] Date of Birth/Sex: Treating RN: February 25, 1961 (62 y.o. [name] [name] Primary Care Orpheus Hayhurst: SYSTEM, PCP Other Clinician: Angelina [name] Referring Emillio Ngo: Treating Truly Stankiewicz/Extender: [name] [name] in Treatment: 8 Vital Signs Height(in): 76 Pulse(bpm): 91 Weight(lbs): 232 Blood Pressure(mmHg): 139/89 Body Mass Index(BMI): 28.2 Temperature(F): 97.7 Respiratory Rate(breaths/min): 18 [1:Photos:] [N/A:N/A] Left, Anterior T Second oe N/A N/A Wound Location: Pressure Injury N/A N/A Wounding Event: Diabetic Wound/Ulcer of the Lower N/A N/A Primary Etiology: Extremity Peripheral Venous Disease, Type II N/A N/A Comorbid History: Diabetes, Osteomyelitis 10/12/2022 N/A N/A Date Acquired: 8 N/A N/A [name] of Treatment: Open N/A N/A Wound Status: No N/A N/A Wound Recurrence: Yes N/A N/A Pending A mputation on Presentation: 0.6x0.9x0.1 N/A N/A Measurements [name] x W x D (cm) 0.424 N/A N/A A (cm) : rea 0.042 N/A N/A Volume (cm) : 55.40% N/A N/A % Reduction in A rea: 91.20% N/A N/A % Reduction in Volume: Grade 3 N/A N/A Classification: Medium N/A N/A Exudate A mount: Serosanguineous N/A N/A Exudate Type: red, brown N/A N/A Exudate Color: Large (67-100%) N/A N/A Granulation A mount: Red, Pink N/A N/A Granulation Quality: None Present (0%) N/A N/A Necrotic  A mount: Fat [name] (Subcutaneous Tissue): Yes N/A N/A Exposed StructuresDUSTIN, [name] ([id number]) [id number] Page 4 of 7 Fascia: No Tendon: No Muscle: No Joint: No Bone: No None N/A N/A Epithelialization: Treatment Notes Electronic Signature(s) Signed: 12/21/2022 4:36:23 PM By: [name] [name] Entered By: [name] [name] on 12/21/2022 10:23:16 -------------------------------------------------------------------------------- Multi-Disciplinary Care Plan Details Patient Name: Date of Service: [name] Sells RNEY [name] 12/21/2022 9:45 A M Medical Record Number: [id number] Patient Account Number: [id number] Date of Birth/Sex: Treating RN: 10/14/1960 (62 y.o. [name] [name] Primary Care Ahlana Slaydon: SYSTEM, PCP Other Clinician: Angelina [name] Referring Parisha Beaulac: Treating Lucio Litsey/Extender: [name] [name] in Treatment: 8  Active Inactive Necrotic Tissue Nursing Diagnoses: Impaired tissue integrity related to necrotic/devitalized tissue Knowledge deficit related to management of necrotic/devitalized tissue Goals: Necrotic/devitalized tissue will be minimized in the wound bed Date Initiated: 10/26/2022 Target Resolution Date: 12/25/2022 Goal Status: Active Patient/caregiver will verbalize understanding of reason and process for debridement of necrotic tissue Date Initiated: 10/26/2022 Date Inactivated: 11/24/2022 Target Resolution Date: 11/24/2022 Goal Status: Met Interventions: Assess patient pain level pre-, during and post procedure and prior to discharge Provide education on necrotic tissue and debridement process Treatment Activities: Excisional debridement : 10/26/2022 T ordered outside of clinic : 10/26/2022 est Notes: Wound/Skin Impairment Nursing Diagnoses: Impaired tissue integrity Knowledge deficit related to smoking impact on wound healing Knowledge deficit related to ulceration/compromised skin integrity Goals: Patient  will demonstrate a reduced rate of smoking or cessation of smoking Date Initiated: 10/26/2022 Date Inactivated: 11/24/2022 Target Resolution Date: 11/24/2022 Goal Status: Met Patient will have a decrease in wound volume by X% from date: (specify in notes) Date Initiated: 10/26/2022 Target Resolution Date: 11/24/2022 Goal Status: Active Patient/caregiver will verbalize understanding of skin care regimen Date Initiated: 10/26/2022 Target Resolution Date: 12/25/2022 Goal Status: Active Ulcer/skin breakdown will have a volume reduction of 30% by week 4 Date Initiated: 10/26/2022 Date Inactivated: 11/24/2022 Target Resolution Date: 11/24/2022 Goal Status: Unmet Unmet Reason: comoridities Ulcer/skin breakdown will have a volume reduction of 50% by week 8 Date Initiated: 10/26/2022 Target Resolution Date: 12/25/2022 [name], [name] ([id number]) [phone].pdf Page 5 of 7 Goal Status: Active Ulcer/skin breakdown will have a volume reduction of 80% by week 12 Date Initiated: 10/26/2022 Target Resolution Date: 01/24/2023 Goal Status: Active Ulcer/skin breakdown will heal within 14 [name] Date Initiated: 10/26/2022 Target Resolution Date: 02/07/2023 Goal Status: Active Interventions: Assess patient/caregiver ability to obtain necessary supplies Assess patient/caregiver ability to perform ulcer/skin care regimen upon admission and as needed Assess ulceration(s) every visit Provide education on smoking Provide education on ulcer and skin care Treatment Activities: Skin care regimen initiated : 10/26/2022 Smoking cessation education : 10/26/2022 Notes: Electronic Signature(s) Signed: 12/21/2022 11:05:27 AM By: [name] [name] Entered By: [name] [name] on 12/21/2022 11:05:27 -------------------------------------------------------------------------------- Pain Assessment Details Patient Name: Date of Service: [name], EDMAN RNEY [name] 12/21/2022 9:45 A M Medical Record Number:  [id number] Patient Account Number: [id number] Date of Birth/Sex: Treating RN: [birth year] (62 y.o. [name] [name] Primary Care Stayce Delancy: SYSTEM, PCP Other Clinician: Angelina [name] Referring Wafaa Deemer: Treating Shylynn Bruning/Extender: [name] [name] in Treatment: 8 Active Problems Location of Pain Severity and Description of Pain Patient Has Paino No Site Locations Rate the pain. Current Pain Level: 0 Pain Management and Medication Current Pain Management: Electronic Signature(s) Signed: 12/21/2022 4:36:23 PM By: [name] [name] Entered By: [name] [name] on 12/21/2022 10:11:33 Patient/Caregiver Education Details -------------------------------------------------------------------------------- [name] ([id number]) [id number] Page 6 of 7 Patient Name: Date of Service: [name], [name] 4/25/2024andnbsp9:45 A M Medical Record Number: [id number] Patient Account Number: [id number] Date of Birth/Gender: Treating RN: August 28, 1961 (62 y.o. [name] [name] Primary Care Physician: SYSTEM, PCP Other Clinician: Angelina [name] Referring Physician: Treating Physician/Extender: [name] [name] in Treatment: 8 Education Assessment Education Provided To: Patient Education Topics Provided Wound Debridement: Handouts: Wound Debridement Methods: Explain/Verbal Responses: State content correctly Wound/Skin Impairment: Handouts: Caring for Your Ulcer Methods: Explain/Verbal Responses: State content correctly Electronic Signature(s) Signed: 12/21/2022 4:36:23 PM By: [name] [name] Entered By: [name] [name] on 12/21/2022 11:06:38 -------------------------------------------------------------------------------- Wound Assessment Details Patient Name: Date of Service: [name] Sells RNEY [name] 12/21/2022 9:45 A M Medical Record Number: [id number]  Patient Account Number: [id number] Date of Birth/Sex: Treating RN: May 12, 1961 (62  y.o. [name] [name] Primary Care Avier Jech: SYSTEM, PCP Other Clinician: Angelina [name] Referring Carri Spillers: Treating Sharyn Brilliant/Extender: [name] [name] in Treatment: 8 Wound Status Wound Number: 1 Primary Etiology: Diabetic Wound/Ulcer of the Lower Extremity Wound Location: Left, Anterior T Second oe Wound Status: Open Wounding Event: Pressure Injury Comorbid Peripheral Venous Disease, Type II Diabetes, History: Osteomyelitis Date Acquired: 10/12/2022 [name] Of Treatment: 8 Clustered Wound: No Pending Amputation On Presentation Photos Wound Measurements Length: (cm) 0.6 Width: (cm) 0.9 Depth: (cm) 0.1 Area: (cm) 0.424 Volume: (cm) 0.042 % Reduction in Area: 55.4% % Reduction in Volume: 91.2% Epithelialization: None Tunneling: No Undermining: No Wound Description Classification: Grade 3 [name] [name], [name] [name] ([id number]) Exudate Amount: Medium Exudate Type: Serosanguineous Exudate Color: red, brown Foul Odor After Cleansing: No [id number].pdf Page 7 of 7 Slough/Fibrino Yes Wound Bed Granulation Amount: Large (67-100%) Exposed Structure Granulation Quality: Red, Pink Fascia Exposed: No Necrotic Amount: None Present (0%) Fat [name] (Subcutaneous Tissue) Exposed: Yes Tendon Exposed: No Muscle Exposed: No Joint Exposed: No Bone Exposed: No Treatment Notes Wound #1 (Toe Second) Wound Laterality: Left, Anterior Cleanser Soap and Water Discharge Instruction: Gently cleanse wound with antibacterial soap, rinse and pat dry prior to dressing wounds Peri-Wound Care Topical Primary Dressing Silvercel Small 2x2 (in/in) Discharge Instruction: Apply Silvercel Small 2x2 (in/in) as instructed Secondary Dressing Gauze Secured With Medipore T - 15M Medipore H Soft Cloth Surgical T ape ape, 2x2 (in/yd) Compression Wrap Compression Stockings Add-Ons Electronic Signature(s) Signed: 12/21/2022 4:36:23 PM By: [name] [name] Entered  By: [name] [name] on 12/21/2022 10:17:33 -------------------------------------------------------------------------------- Vitals Details Patient Name: Date of Service: [name] Sells RNEY [name] 12/21/2022 9:45 A M Medical Record Number: [id number] Patient Account Number: [id number] Date of Birth/Sex: Treating RN: [birth year] (62 y.o. [name] [name] Primary Care Stephenson Cichy: SYSTEM, PCP Other Clinician: Angelina [name] Referring Tresean Mattix: Treating Achille Xiang/Extender: [name] [name] in Treatment: 8 Vital Signs Time Taken: 10:09 Temperature (F): 97.7 Height (in): 76 Pulse (bpm): 91 Weight (lbs): 232 Respiratory Rate (breaths/min): 18 Body Mass Index (BMI): 28.2 Blood Pressure (mmHg): 139/89 Capillary Blood Glucose (mg/dl): 97 Reference Range: 80 - 120 mg / dl Airway Pulse Oximetry (%): 99 Electronic Signature(s) Signed: 12/27/2022 9:47:58 AM By: [name] [name] Entered By: [name] [name] on 12/21/2022 13:57:27

## 2022-12-28 NOTE — Progress Notes (Signed)
FOWLER, ANTOS ([id number]) [id number] Page 1 of 2 Visit Report for 12/22/2022 HBO Details Patient Name: Date of Service: [name] [name], [name] [name] 12/22/2022 10:30 A M Medical Record Number: [id number] Patient Account Number: [id number] Date of Birth/Sex: Treating RN: December 12, 1960 (62 y.o. 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 HBO Treatment Details Treatment Number: Treatment Not Started: Coty Student Choice Patient Type: Outpatient Chamber Type: Monoplace Treatment Protocol: 2.0 ATA with 90 minutes oxygen, and no air breaks Treatment Details Air breaks and breathing Decompress Decompress Compress Tx Pressure Begins Reached periods Begins Ends (leave unused spaces blank) Chamber Pressure (ATA 1 2 ------2 1 ) Clock Time (24 hr) - - ------- - Treatment Length: (minutes) Treatment Segments: Vital Signs Capillary Blood Glucose Reference Range: 80 - 120 mg / dl HBO Diabetic Blood Glucose Intervention Range: <131 mg/dl or >324 mg/dl Type: Time Vitals Blood Pulse: Respiratory Temperature: Capillary Blood Glucose Pulse Action Taken: Pressure: Rate: Glucose (mg/dl): Meter #: Oximetry (%) Taken: Pre 10:10 126/80 89 18 98 95 1 99 2 ensures, crackers, and peanut butter Pre 10:40 104 1 reschedule Treatment Response Treatment Completion Status: Treatment Not Started Reason: Karime Scheuermann Choice Treatment Notes Patient initial glucose level was 95. Patient drank and ate 2 ensures, crackers and peanut butter. Glucose was taken 30 min after. Result 104. Per Joye Wesenberg r/s to next treatment day. Being sure to eat a well balanced meal upon  arrival. Electronic Signature(s) Signed: 12/25/2022 6:40:35 PM By: [name] PA-C Signed: 12/27/2022 9:47:58 AM By: Demetria Pore Previous Signature: 12/22/2022 12:43:01 PM Version By: [name] PA-C Entered By: Demetria Pore on 12/22/2022 12:45:21 -------------------------------------------------------------------------------- HBO Safety Checklist Details Patient Name: Date of Service: [name] Sells RNEY L. 12/22/2022 10:30 A M Medical Record Number: [id number] Patient Account Number: [id number] Date of Birth/Sex: Treating RN: Oct 08, 1960 (62 y.o. M) Primary Care Nysir Fergusson: SYSTEM, PCP Other Clinician: Referring Zyshonne Malecha: Treating Blessings Inglett/Extender: Kaylyn Layer in Treatment: 8 HBO Safety Checklist Items [name] [name], [name] [name] ([id number]) [id number] Page 2 of 2 Safety Checklist Consent Form Signed Patient voided / foley secured and emptied When did you last eato 12/22/2022 am Last dose of injectable or oral agent n/a Ostomy pouch emptied and vented if applicable NA All implantable devices assessed, documented and approved NA Intravenous access site secured and place NA Valuables secured Linens and cotton and cotton/polyester blend (less than 51% polyester) Personal oil-based products / skin lotions / body lotions removed Wigs or hairpieces removed NA Smoking or tobacco materials removed Books / newspapers / magazines / loose paper removed Cologne, aftershave, perfume and deodorant removed Jewelry removed (may wrap wedding band) Make-up removed NA Hair care products removed Battery operated devices (external) removed NA Heating patches and chemical warmers removed NA Titanium eyewear removed NA Nail polish cured greater than 10 hours NA Casting material cured greater than 10 hours NA Hearing aids removed NA Loose dentures or partials removed NA Prosthetics have been removed NA Patient demonstrates correct use of air break device  (if applicable) Patient concerns have been addressed Patient grounding bracelet on and cord attached to chamber Specifics for Inpatients (complete in addition to above) Medication sheet sent with patient Intravenous medications needed or due during therapy sent with patient Drainage tubes (e.g. nasogastric tube or chest tube secured  and vented) Endotracheal or Tracheotomy tube secured Cuff deflated of air and inflated with saline Airway suctioned Electronic Signature(s) Signed: 12/27/2022 9:47:58 AM By: Demetria Pore Entered By: Demetria Pore on 12/22/2022 12:44:59

## 2022-12-28 NOTE — Progress Notes (Signed)
NICHOALS, HEYDE ([id number]) [id number] Page 1 of 2 Visit Report for 12/21/2022 Arrival Information Details Patient Name: Date of Service: PEGGY, MONK 12/21/2022 11:00 A M Medical Record Number: [id number] Patient Account Number: [id number] Date of Birth/Sex: Treating RN: [birth year] (62 y.o. M) Primary Care Ladaisha Portillo: SYSTEM, PCP Other Clinician: Referring Dayven Linsley: Treating Dakai Braithwaite/Extender: Kaylyn Layer in Treatment: 8 Visit Information History Since Last Visit Added or deleted any medications: No Patient Arrived: Ambulatory Any new allergies or adverse reactions: No Arrival Time: 12:53 Had a fall or experienced change in No Accompanied By: self activities of daily living that may affect Transfer Assistance: None risk of falls: Patient Requires Transmission-Based Precautions: No Signs or symptoms of abuse/neglect since last visito No Patient Has Alerts: Yes Hospitalized since last visit: No Patient Alerts: Diabetes Type 2 Implantable device outside of the clinic excluding No R ABI 1.03 L 1.10 cellular tissue based products placed in the center since last visit: Pain Present Now: No Electronic Signature(s) Signed: 12/27/2022 9:47:58 AM By: Demetria Pore Entered By: Demetria Pore on 12/21/2022 13:57:52 -------------------------------------------------------------------------------- Encounter Discharge Information Details Patient Name: Date of Service: [name] 12/21/2022 11:00 A M Medical Record Number: [id number] Patient Account Number: [id number] Date of Birth/Sex: Treating RN: 04/06/1961 (62 y.o. M) Primary Care Taleshia Luff: SYSTEM, PCP Other Clinician: Referring Pistol Kessenich: Treating Collins Dimaria/Extender: Kaylyn Layer in Treatment: 8 Encounter Discharge Information Items Discharge Condition: Stable Ambulatory Status: Ambulatory Discharge Destination: Home Transportation: Private  Auto Accompanied By: self Schedule Follow-up Appointment: Yes Clinical Summary of Care: Electronic Signature(s) Signed: 12/27/2022 9:47:58 AM By: Demetria Pore Entered By: Demetria Pore on 12/21/2022 14:03:51 -------------------------------------------------------------------------------- Vitals Details Patient Name: Date of Service: [name] 12/21/2022 11:00 A M Medical Record Number: [id number] Patient Account Number: [id number] Date of Birth/Sex: Treating RN: 12/29/1960 (62 y.o. M) Primary Care Chassidy Layson: SYSTEM, PCP Other Clinician: Referring Hoke Baer: Treating Dresden Lozito/Extender: Kaylyn Layer in Treatment: 8 Vital Signs Time Taken: 11:31 Temperature (F): 98.0 Height (in): 76 Pulse (bpm): 103 Roddy, Cranston L ([id number]) [phone].pdf Page 2 of 2 Weight (lbs): 232 Respiratory Rate (breaths/min): 18 Body Mass Index (BMI): 28.2 Blood Pressure (mmHg): 120/78 Capillary Blood Glucose (mg/dl): 97 Reference Range: 80 - 120 mg / dl Airway Pulse Oximetry (%): 99 Electronic Signature(s) Signed: 12/27/2022 9:47:58 AM By: Demetria Pore Entered By: Demetria Pore on 12/21/2022 14:01:32

## 2022-12-28 NOTE — Progress Notes (Signed)
SON, BARKAN ([id number]) [id number] Page 1 of 2 Visit Report for 12/13/2022 Arrival Information Details Patient Name: Date of Service: [name] [name], [name] [name] 12/13/2022 10:30 A M Medical Record Number: [id number] Patient Account Number: [id number] Date of Birth/Sex: Treating RN: 12/25/1960 (62 y.o. M) Primary Care Dayln Tugwell: SYSTEM, PCP Other Clinician: Referring Breelle Hollywood: Treating Austin Herd/Extender: Mallie Snooks in Treatment: 6 Visit Information History Since Last Visit Added or deleted any medications: No Patient Arrived: Ambulatory Any new allergies or adverse reactions: No Arrival Time: 12:12 Had a fall or experienced change in No Accompanied By: self activities of daily living that may affect Transfer Assistance: None risk of falls: Patient Identification Verified: Yes Signs or symptoms of abuse/neglect since last visito No Patient Requires Transmission-Based Precautions: No Hospitalized since last visit: No Patient Has Alerts: Yes Has Dressing in Place as Prescribed: Yes Patient Alerts: Diabetes Type 2 Pain Present Now: No R ABI 1.03 [name] Electronic Signature(s) Signed: 12/27/2022 9:47:58 AM By: Demetria Pore Entered By: Demetria Pore on 12/13/2022 14:33:15 -------------------------------------------------------------------------------- Encounter Discharge Information Details Patient Name: Date of Service: [name] Sells RNEY [name] 12/13/2022 10:30 A M Medical Record Number: [id number] Patient Account Number: [id number] Date of Birth/Sex: Treating RN: [birth year] (62 y.o. M) Primary Care Dionisio Aragones: SYSTEM, PCP Other Clinician: Referring Kaisley Stiverson: Treating Zaylie Gisler/Extender: Mallie Snooks in Treatment: 6 Encounter Discharge Information Items Discharge Condition: Stable Ambulatory Status: Ambulatory Discharge Destination: Home Transportation: Private Auto Accompanied By: self Schedule Follow-up  Appointment: Yes Clinical Summary of Care: Electronic Signature(s) Signed: 12/27/2022 9:47:58 AM By: Demetria Pore Entered By: Demetria Pore on 12/13/2022 14:58:25 -------------------------------------------------------------------------------- Vitals Details Patient Name: Date of Service: [name] Sells RNEY [name] 12/13/2022 10:30 A M Medical Record Number: [id number] Patient Account Number: [id number] Date of Birth/Sex: Treating RN: 1961/02/20 (62 y.o. M) Primary Care Bexton Haak: SYSTEM, PCP Other Clinician: Referring Sibel Khurana: Treating Rigo Letts/Extender: Mallie Snooks in Treatment: 6 Vital Signs Time Taken: 12:13 Temperature (F): 98.0 Height (in): 76 Pulse (bpm): 100 Weight (lbs): 232 Respiratory Rate (breaths/min): 18 Body Mass Index (BMI): 28.2 Blood Pressure (mmHg): 132/84 [name] [name], [name] [name] ([id number]) [id number] Page 2 of 2 Capillary Blood Glucose (mg/dl): 91 Reference Range: 80 - 120 mg / dl Airway Pulse Oximetry (%): 98 Electronic Signature(s) Signed: 12/27/2022 9:47:58 AM By: Demetria Pore Entered By: Demetria Pore on 12/13/2022 14:33:28

## 2022-12-28 NOTE — Progress Notes (Signed)
COURVOISIER, HAMBLEN ([id number]) [id number] Page 1 of 2 Visit Report for 12/19/2022 Arrival Information Details Patient Name: Date of Service: [name] [name], [name] [name] 12/19/2022 10:30 A M Medical Record Number: [id number] Patient Account Number: [id number] Date of Birth/Sex: Treating RN: 1961-08-19 (62 y.o. M) Primary Care Gean Larose: SYSTEM, PCP Other Clinician: Referring Lizabeth Fellner: Treating Maymie Brunke/Extender: Kaylyn Layer in Treatment: 7 Visit Information History Since Last Visit Added or deleted any medications: No Patient Arrived: Ambulatory Any new allergies or adverse reactions: No Arrival Time: 14:32 Had a fall or experienced change in No Accompanied By: self activities of daily living that may affect Transfer Assistance: None risk of falls: Patient Identification Verified: Yes Signs or symptoms of abuse/neglect since last visito No Secondary Verification Process Completed: Yes Hospitalized since last visit: No Patient Requires Transmission-Based Precautions: No Implantable device outside of the clinic excluding No Patient Has Alerts: Yes cellular tissue based products placed in the center Patient Alerts: Diabetes Type 2 since last visit: R ABI 1.03 [name] Pain Present Now: No Electronic Signature(s) Signed: 12/27/2022 9:47:58 AM By: Demetria Pore Entered By: Demetria Pore on 12/19/2022 16:23:50 -------------------------------------------------------------------------------- Encounter Discharge Information Details Patient Name: Date of Service: [name] Sells RNEY [name] 12/19/2022 10:30 A M Medical Record Number: [id number] Patient Account Number: [id number] Date of Birth/Sex: Treating RN: 1961-02-18 (62 y.o. M) Primary Care Jaeline Whobrey: SYSTEM, PCP Other Clinician: Referring Malayia Spizzirri: Treating Demarie Hyneman/Extender: Kaylyn Layer in Treatment: 7 Encounter Discharge Information Items Discharge Condition: Stable Ambulatory  Status: Ambulatory Discharge Destination: Home Transportation: Private Auto Accompanied By: self Schedule Follow-up Appointment: Yes Clinical Summary of Care: Electronic Signature(s) Signed: 12/27/2022 9:47:58 AM By: Demetria Pore Entered By: Demetria Pore on 12/19/2022 16:24:22 -------------------------------------------------------------------------------- Vitals Details Patient Name: Date of Service: [name] Sells RNEY [name] 12/19/2022 10:30 A M Medical Record Number: [id number] Patient Account Number: [id number] Date of Birth/Sex: Treating RN: 1961-05-05 (62 y.o. M) Primary Care Aspyn Warnke: SYSTEM, PCP Other Clinician: Referring Sheana Bir: Treating Vadie Principato/Extender: Kaylyn Layer in Treatment: 7 Vital Signs Time Taken: 10:32 Temperature (F): 97.8 Height (in): 76 Pulse (bpm): 106 [name] [name] ([id number]) [phone].pdf Page 2 of 2 Weight (lbs): 232 Respiratory Rate (breaths/min): 18 Body Mass Index (BMI): 28.2 Blood Pressure (mmHg): 140/80 Capillary Blood Glucose (mg/dl): 433 Reference Range: 80 - 120 mg / dl Airway Pulse Oximetry (%): 99 Electronic Signature(s) Signed: 12/27/2022 9:47:58 AM By: Demetria Pore Entered By: Demetria Pore on 12/19/2022 16:23:54

## 2022-12-28 NOTE — Progress Notes (Signed)
RAEVON, [name] ([id number]) [id number] Page 1 of 2 Visit Report for 12/26/2022 Arrival Information Details Patient Name: Date of Service: [name] [name], [name] [name] 12/26/2022 10:30 A M Medical Record Number: [id number] Patient Account Number: [id number] Date of Birth/Sex: Treating RN: 1961/05/21 (62 y.o. M) Primary Care Marrietta Thunder: SYSTEM, PCP Other Clinician: Referring Jenesa Foresta: Treating Tranice Laduke/Extender: Kaylyn Layer in Treatment: 8 Visit Information History Since Last Visit Added or deleted any medications: No Patient Arrived: Ambulatory Any new allergies or adverse reactions: No Arrival Time: 14:51 Had a fall or experienced change in No Accompanied By: self activities of daily living that may affect Transfer Assistance: None risk of falls: Patient Requires Transmission-Based Precautions: No Signs or symptoms of abuse/neglect since last visito No Patient Has Alerts: Yes Hospitalized since last visit: No Patient Alerts: Diabetes Type 2 Implantable device outside of the clinic excluding No [name] ABI 1.03 [name] cellular tissue based products placed in the center since last visit: Pain Present Now: No Electronic Signature(s) Signed: 12/27/2022 9:47:58 AM By: Demetria Pore Entered By: Demetria Pore on 12/26/2022 14:51:53 -------------------------------------------------------------------------------- Encounter Discharge Information Details Patient Name: Date of Service: [name] [name] RNEY [name] 12/26/2022 10:30 A M Medical Record Number: [id number] Patient Account Number: [id number] Date of Birth/Sex: Treating RN: 05-11-1961 (62 y.o. M) Primary Care Brookelynne Dimperio: SYSTEM, PCP Other Clinician: Referring Quinlyn Tep: Treating Laquinn Shippy/Extender: Kaylyn Layer in Treatment: 8 Encounter Discharge Information Items Discharge Condition: Stable Ambulatory Status: Ambulatory Discharge Destination: Home Transportation: Private  Auto Accompanied By: self Schedule Follow-up Appointment: Yes Clinical Summary of Care: Electronic Signature(s) Signed: 12/27/2022 9:47:58 AM By: Demetria Pore Entered By: Demetria Pore on 12/26/2022 15:08:29 -------------------------------------------------------------------------------- Vitals Details Patient Name: Date of Service: [name] [name] RNEY [name] 12/26/2022 10:30 A M Medical Record Number: [id number] Patient Account Number: [id number] Date of Birth/Sex: Treating RN: [birth year] (62 y.o. M) Primary Care Takera Rayl: SYSTEM, PCP Other Clinician: Referring [place] Dalporto: Treating Jiovanni Heeter/Extender: Kaylyn Layer in Treatment: 8 Vital Signs Time Taken: 10:52 Temperature (F): 98.0 Height (in): 76 Pulse (bpm): 112 [name] [name] ([id number]) [id number] Page 2 of 2 Weight (lbs): 232 Respiratory Rate (breaths/min): 18 Body Mass Index (BMI): 28.2 Blood Pressure (mmHg): 124/80 Capillary Blood Glucose (mg/dl): 97 Reference Range: 80 - 120 mg / dl Airway Pulse Oximetry (%): 99 Electronic Signature(s) Signed: 12/27/2022 9:47:58 AM By: Demetria Pore Entered By: Demetria Pore on 12/26/2022 14:52:44

## 2022-12-28 NOTE — Progress Notes (Signed)
HAMPTON, WIXOM ([id number]) [id number] Page 1 of 2 Visit Report for 12/13/2022 HBO Details Patient Name: Date of Service: [name] [name], [name] [name] 12/13/2022 10:30 A M Medical Record Number: [id number] Patient Account Number: [id number] Date of Birth/Sex: Treating RN: 1961-08-21 (62 y.o. 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 HBO Treatment Details Treatment Number: 1 Patient Type: Outpatient Chamber Type: Monoplace Chamber Serial #: [id number] Treatment Protocol: 2.0 ATA with 90 minutes oxygen, and no air breaks Treatment Details Compression Rate Down: 2.0 psi / minute De-Compression Rate Up: 1.0 psi / minute Air breaks and breathing Decompress Decompress Compress Tx Pressure Begins Reached periods Begins Ends (leave unused spaces blank) Chamber Pressure (ATA 1 2 ------2 1 ) Clock Time (24 hr) 12:31 12:45 - - - - - - 14:18 14:28 Treatment Length: 117 (minutes) Treatment Segments: 4 Vital Signs Capillary Blood Glucose Reference Range: 80 - 120 mg / dl HBO Diabetic Blood Glucose Intervention Range: <131 mg/dl or >536 mg/dl Type: Time Vitals Blood Pulse: Respiratory Temperature: Capillary Blood Glucose Pulse Action Taken: Pressure: Rate: Glucose (mg/dl): Meter #: Oximetry (%) Taken: Pre 11:00 132/84 100 18 98 91 1 98 ensure, 2 orange juices per MD Pre 11:32 116 1 ensure, 2 orange juices per MD Pre 157 1 none per protocol proceed per MD Post 02:28 140/80 92 18 98 126 1 98 none per protocol Treatment Notes Per treatment vitals was obtained. Patient glucose was obtained Result:91. Per MD give patient ensure and orange juice x2.  Glucose level was obtained 30 min after Results: 116. Per MD give patient ensure and orange juice x2. Glucose level was obtained 30 min after ensure etc. Within normal range result 157. Proceed per MD. Patient experienced excessive sweating. Notified MD. Vieva Brummitt Notes Evaluated patient prior to treatment. He had no issues or complaints. Tympanic membrane clear bilaterally to the ears. He expressed no concerns during his treatment. HBO Attestation I certify that I supervised this HBO treatment in accordance with Medicare guidelines. A trained emergency response team is readily available per Yes hospital policies and procedures. Continue HBOT as ordered. Yes Electronic Signature(s) Signed: 12/13/2022 3:21:24 PM By: Geralyn Corwin DO Signed: 12/27/2022 9:47:58 AM By: Demetria Pore Previous Signature: 12/13/2022 2:19:59 PM Version By: Geralyn Corwin DO Entered By: Demetria Pore on 12/13/2022 14:57:38 [name] ([id number]) [id number] Page 2 of 2 -------------------------------------------------------------------------------- HBO Safety Checklist Details Patient Name: Date of Service: [name] [name], [name] [name] 12/13/2022 10:30 A M Medical Record Number: [id number] Patient Account Number: [id number] Date of Birth/Sex: Treating RN: October 29, 1960 (62 y.o. M) Primary Care Maxfield Gildersleeve: SYSTEM, PCP Other Clinician: Referring Marcus Groll: Treating Aedyn Kempfer/Extender: Mallie Snooks in Treatment: 6 HBO Safety Checklist Items Safety Checklist Consent Form Signed Patient voided / foley secured and emptied When did you last eato 12/13/2022 am Last dose of injectable or oral agent n/a Ostomy pouch emptied and vented if applicable NA All implantable devices assessed, documented and approved NA Intravenous access site secured and place NA Valuables secured Linens and cotton and cotton/polyester blend (less than 51% polyester) Personal oil-based products / skin  lotions / body lotions removed NA Wigs or hairpieces removed NA Smoking or tobacco materials removed NA Books / newspapers / magazines / loose paper removed Cologne, aftershave, perfume and  deodorant removed Jewelry removed (may wrap wedding band) NA Make-up removed NA Hair care products removed NA Battery operated devices (external) removed NA Heating patches and chemical warmers removed NA Titanium eyewear removed NA Nail polish cured greater than 10 hours NA Casting material cured greater than 10 hours NA Hearing aids removed NA Loose dentures or partials removed NA Prosthetics have been removed NA Patient demonstrates correct use of air break device (if applicable) Patient concerns have been addressed Patient grounding bracelet on and cord attached to chamber Specifics for Inpatients (complete in addition to above) Medication sheet sent with patient Intravenous medications needed or due during therapy sent with patient Drainage tubes (e.g. nasogastric tube or chest tube secured and vented) Endotracheal or Tracheotomy tube secured Cuff deflated of air and inflated with saline Airway suctioned Electronic Signature(s) Signed: 12/27/2022 9:47:58 AM By: Demetria Pore Entered By: Demetria Pore on 12/13/2022 14:33:33

## 2022-12-28 NOTE — Progress Notes (Signed)
[name] [name], [name] [name] ([id number]) [id number] Page 1 of 2 Visit Report for 12/15/2022 Physician Orders Details Patient Name: Date of Service: [name] [name], [name] [name] 12/15/2022 11:00 A M Medical Record Number: [id number] Patient Account Number: [id number] Date of Birth/Sex: Treating RN: 1961/03/14 (62 y.o. M) Primary Care Provider: SYSTEM, PCP Other Clinician: Referring Provider: Treating Provider/Extender: Kaylyn Layer in Treatment: 7 Verbal / Phone Orders: No Diagnosis Coding Wound Treatment Electronic Signature(s) Signed: 12/19/2022 11:50:37 AM By: [name] PA-C Signed: 12/27/2022 9:47:58 AM By: Demetria Pore Entered By: Demetria Pore on 12/19/2022 11:31:46 -------------------------------------------------------------------------------- SuperBill Details Patient Name: Date of Service: [name] [name] 12/15/2022 Medical Record Number: [id number] Patient Account Number: [id number] Date of Birth/Sex: Treating RN: [birth year] (62 y.o. 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 Chronic venous hypertension (idiopathic) with inflammation of bilateral lower extremity F17.218 Nicotine dependence, cigarettes, with other nicotine-induced disorders Facility Procedures : CPT4 Code: 36644034 Description: (Facility Use Only) HBOT full body chamber, , ICD-10 Diagnosis Description M86.372 Chronic multifocal osteomyelitis,  left ankle and foot E11.621 Type 2 diabetes mellitus with foot ulcer L97.524 Non-pressure chronic ulcer of other part  of left foot with necrosis of bone M14.672 Charcot's joint, left ankle and foot Modifier: Quantity: 4 Physician Procedures : [name] Sells CPT4: Description Modifier Code 74259 [phone] Physician attendance and supervision of hyperbaric oxygen therapy, per sessionsupervision of hyperbaric Oxygen therapy, per session ICD-10 Diagnosis Description M86.372 Chronic multifocal osteomyelitis, left  ankle and foot E11.621 Type 2 diabetes mellitus with foot ulcer 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

## 2022-12-28 NOTE — Progress Notes (Signed)
[name] [name] ([id number]) [id number] Page 1 of 2 Visit Report for 12/19/2022 HBO Details Patient Name: Date of Service: [name] [name], [name] [name] 12/19/2022 10:30 A M Medical Record Number: [id number] Patient Account Number: [id number] Date of Birth/Sex: Treating RN: 06/22/1961 (62 y.o. M) Primary Care [name] [name]: SYSTEM, PCP Other Clinician: Referring [name] [name]: Treating [name] [name]/Extender: [name] [name] in Treatment: 7 HBO Treatment Course Details Treatment Course Number: 1 Ordering [name] [name]: [name] [name] T Treatments Ordered: otal 40 HBO Treatment Start Date: 12/13/2022 HBO Indication: Chronic Refractory Osteomyelitis to left foot HBO Treatment Details Treatment Number: 4 Patient Type: Outpatient Chamber Type: Monoplace Chamber Serial #: [id number] Treatment Protocol: 2.0 ATA with 90 minutes oxygen, and no air breaks Treatment Details Compression Rate Down: 2.0 psi / minute De-Compression Rate Up: 1.5 psi / minute Air breaks and breathing Decompress Decompress Compress Tx Pressure Begins Reached periods Begins Ends (leave unused spaces blank) Chamber Pressure (ATA 1 2 ------2 1 ) Clock Time (24 hr) 10:33 10:42 - - - - - - 12:18 12:27 Treatment Length: 114 (minutes) Treatment Segments: 4 Vital Signs Capillary Blood Glucose Reference Range: 80 - 120 mg / dl HBO Diabetic Blood Glucose Intervention Range: <131 mg/dl or >324 mg/dl Type: Time Vitals Blood Respiratory Capillary Blood Glucose Pulse Action Pulse: Temperature: Taken: Pressure: Rate: Glucose (mg/dl): Meter #: Oximetry (%) Taken: Pre 10:32 140/80 106 18 97.8 154 1 99 none per protocol Post 12:27 140/80 98 18 98 117 1 99 none per protocol Treatment Response Treatment Toleration: Well Treatment Completion Status: Treatment Completed without Adverse Event Electronic Signature(s) Signed: 12/19/2022 4:59:06 PM By: [name] Derry PA-C Signed: 12/27/2022 9:47:58 AM By: [name] [name] Entered By: [name] [name] on 12/19/2022 16:24:06 -------------------------------------------------------------------------------- HBO Safety Checklist Details Patient Name: Date of Service: [name] Sells RNEY L. 12/19/2022 10:30 A M Medical Record Number: [id number] Patient Account Number: [id number] Date of Birth/Sex: Treating RN: 09-27-1960 (62 y.o. M) Primary Care Lanah Steines: SYSTEM, PCP Other Clinician: Referring Flavia Bruss: Treating [name] [name]/Extender: [name] [name] in Treatment: 7 HBO Safety Checklist Items Safety Checklist Consent Form Signed [name] [name] ([id number]) [id number] Page 2 of 2 Patient voided / foley secured and emptied When did you last eato 12/19/2022 am Last dose of injectable or oral agent n/a Ostomy pouch emptied and vented if applicable NA All implantable devices assessed, documented and approved NA Intravenous access site secured and place NA Valuables secured Linens and cotton and cotton/polyester blend (less than 51% polyester) Personal oil-based products / skin lotions / body lotions removed Wigs or hairpieces removed NA Smoking or tobacco materials removed Books / newspapers / magazines / loose paper removed Cologne, aftershave, perfume and deodorant removed Jewelry removed (may wrap wedding band) Make-up removed NA Hair care products removed Battery operated devices (external) removed NA Heating patches and chemical warmers removed NA Titanium eyewear removed NA Nail polish cured greater than 10 hours NA Casting material cured greater than 10 hours NA Hearing aids removed NA Loose dentures or partials removed NA Prosthetics have been removed NA Patient demonstrates correct use of air break device (if applicable) Patient concerns have been addressed Patient grounding bracelet on and cord attached to chamber Specifics for Inpatients (complete in addition to above) Medication sheet sent with  patient Intravenous medications needed or due during therapy sent with patient Drainage tubes (e.g. nasogastric tube or chest tube secured and vented) Endotracheal or Tracheotomy tube secured Cuff deflated of air and inflated with saline Airway suctioned Electronic Signature(s) Signed: 12/27/2022 9:47:58 AM By: Lynnell Jude,  Tarri Glenn By: [name] [name] on 12/19/2022 16:23:57

## 2022-12-28 NOTE — Progress Notes (Signed)
[name] [name], [name] [name] ([id number]) [id number] Page 1 of 2 Visit Report for [name] HBO Details Patient Name: Date of Service: [name] [name], [name] [name] [name] 10:30 A M Medical Record Number: [id number] Patient Account Number: [id number] Date of Birth/Sex: Treating RN: [name] [name] (62 y.o. M) Primary Care Ellesse [name]: SYSTEM, PCP Other Clinician: Referring [name] [name]: Treating [name] [name]/Extender: [name] [name] in Treatment: 7 HBO Treatment Course Details Treatment Course Number: 1 Ordering [name] [name]: [name] [name] T Treatments Ordered: otal 40 HBO Treatment Start Date: 12/13/2022 HBO Indication: Chronic Refractory Osteomyelitis to left foot HBO Treatment Details Treatment Number: 3 Patient Type: Outpatient Chamber Type: Monoplace Chamber Serial #: [id number] Treatment Protocol: 2.0 ATA with 90 minutes oxygen, and no air breaks Treatment Details Compression Rate Down: 2.0 psi / minute De-Compression Rate Up: 1.5 psi / minute Air breaks and breathing Decompress Decompress Compress Tx Pressure Begins Reached periods Begins Ends (leave unused spaces blank) Chamber Pressure (ATA 1 2 ------2 1 ) Clock Time (24 hr) 10:18 10:31 - - - - - - 11:50 12:10 Treatment Length: 112 (minutes) Treatment Segments: 4 Vital Signs Capillary Blood Glucose Reference Range: 80 - 120 mg / dl HBO Diabetic Blood Glucose Intervention Range: <131 mg/dl or >664 mg/dl Type: Time Vitals Blood Respiratory Capillary Blood Glucose Pulse Action Pulse: Temperature: Taken: Pressure: Rate: Glucose (mg/dl): Meter #: Oximetry (%) Taken: Pre 10:18 134/80 96 18 98.1 195 97 Post 12:10 134/80 67 18 97.8 102 1 none per protocol Treatment Response Treatment Toleration: Well Treatment Completion Status: Treatment Completed without Adverse Event Electronic Signature(s) Signed: 12/19/2022 11:50:37 AM By: [name] Derry PA-C Signed: 12/27/2022 9:47:58 AM By: Demetria Pore Entered By: Demetria Pore  on 12/18/2022 12:32:40 -------------------------------------------------------------------------------- HBO Safety Checklist Details Patient Name: Date of Service: [name] [name] 10:30 A M Medical Record Number: [id number] Patient Account Number: [id number] Date of Birth/Sex: Treating RN: [name] (62 y.o. M) Primary Care [name] [name]: SYSTEM, PCP Other Clinician: Referring [name] [name]: Treating [name] [name]/Extender: [name] [name] in Treatment: 7 HBO Safety Checklist Items Safety Checklist Consent Form Signed [name] [name] ([id number]) [id number] Page 2 of 2 Patient voided / foley secured and emptied When did you last eato 12/18/2022 am Last dose of injectable or oral agent n/a Ostomy pouch emptied and vented if applicable NA All implantable devices assessed, documented and approved NA Intravenous access site secured and place NA Valuables secured Linens and cotton and cotton/polyester blend (less than 51% polyester) Personal oil-based products / skin lotions / body lotions removed Wigs or hairpieces removed NA Smoking or tobacco materials removed Books / newspapers / magazines / loose paper removed Cologne, aftershave, perfume and deodorant removed Jewelry removed (may wrap wedding band) Make-up removed NA Hair care products removed Battery operated devices (external) removed NA Heating patches and chemical warmers removed NA Titanium eyewear removed NA Nail polish cured greater than 10 hours NA Casting material cured greater than 10 hours NA Hearing aids removed NA Loose dentures or partials removed NA Prosthetics have been removed NA Patient demonstrates correct use of air break device (if applicable) Patient concerns have been addressed Patient grounding bracelet on and cord attached to chamber Specifics for Inpatients (complete in addition to above) Medication sheet sent with patient Intravenous  medications needed or due during therapy sent with patient Drainage tubes (e.g. nasogastric tube or chest tube secured and vented) Endotracheal or Tracheotomy tube secured Cuff deflated of air and inflated with saline Airway suctioned Electronic Signature(s) Signed: 12/27/2022 9:47:58 AM By: Demetria Pore Entered By: Demetria Pore  on 12/18/2022 12:30:39

## 2022-12-28 NOTE — Progress Notes (Signed)
[name], [name] ([id number]) [id number] Page 1 of 2 Visit Report for 12/15/2022 Arrival Information Details Patient Name: Date of Service: [name] [name], [name] [name] 12/15/2022 11:00 A M Medical Record Number: [id number] Patient Account Number: [id number] Date of Birth/Sex: Treating RN: 1960/11/09 (62 y.o. M) Primary Care Taleisha Kaczynski: SYSTEM, PCP Other Clinician: Referring Amariyana Heacox: Treating Demon Volante/Extender: Kaylyn Layer in Treatment: 7 Visit Information History Since Last Visit Added or deleted any medications: No Patient Arrived: Ambulatory Any new allergies or adverse reactions: No Arrival Time: 13:33 Had a fall or experienced change in No Accompanied By: self activities of daily living that may affect Transfer Assistance: None risk of falls: Patient Identification Verified: Yes Signs or symptoms of abuse/neglect since last visito No Secondary Verification Process Completed: Yes Hospitalized since last visit: No Patient Requires Transmission-Based Precautions: No Implantable device outside of the clinic excluding No Patient Has Alerts: Yes cellular tissue based products placed in the center Patient Alerts: Diabetes Type 2 since last visit: R ABI 1.03 [name] Pain Present Now: No Electronic Signature(s) Signed: 12/27/2022 9:47:58 AM By: Demetria Pore Entered By: Demetria Pore on 12/19/2022 11:29:59 -------------------------------------------------------------------------------- Encounter Discharge Information Details Patient Name: Date of Service: [name] Sells RNEY [name] 12/15/2022 11:00 A M Medical Record Number: [id number] Patient Account Number: [id number] Date of Birth/Sex: Treating RN: 21-Dec-1960 (62 y.o. M) Primary Care Jairy Angulo: SYSTEM, PCP Other Clinician: Referring Xzavion Doswell: Treating Greyson Peavy/Extender: Kaylyn Layer in Treatment: 7 Encounter Discharge Information Items Discharge Condition: Stable Ambulatory  Status: Ambulatory Discharge Destination: Home Transportation: Private Auto Schedule Follow-up Appointment: Yes Clinical Summary of Care: Electronic Signature(s) Signed: 12/27/2022 9:47:58 AM By: Demetria Pore Entered By: Demetria Pore on 12/19/2022 11:31:50 -------------------------------------------------------------------------------- Vitals Details Patient Name: Date of Service: [name] Sells RNEY [name] 12/15/2022 11:00 A M Medical Record Number: [id number] Patient Account Number: [id number] Date of Birth/Sex: Treating RN: 09-14-1960 (62 y.o. M) Primary Care Cortina Vultaggio: SYSTEM, PCP Other Clinician: Referring Brenner Visconti: Treating Jahyra Sukup/Extender: Kaylyn Layer in Treatment: 7 Vital Signs Time Taken: 10:33 Temperature (F): 98.0 Height (in): 76 Pulse (bpm): 96 Weight (lbs): 232 Respiratory Rate (breaths/min): 18 [name], Delano [name] ([id number]) [id number] Page 2 of 2 Body Mass Index (BMI): 28.2 Blood Pressure (mmHg): 128/80 Capillary Blood Glucose (mg/dl): 244 Reference Range: 80 - 120 mg / dl Airway Pulse Oximetry (%): 98 Electronic Signature(s) Signed: 12/27/2022 9:47:58 AM By: Demetria Pore Entered By: Demetria Pore on 12/15/2022 13:34:44

## 2022-12-28 NOTE — Progress Notes (Signed)
[name] [name], [name] [name] ([id number]) [id number] Page 1 of 2 Visit Report for 12/21/2022 HBO Details Patient Name: Date of Service: [name] [name], [name] [name] 12/21/2022 11:00 A M Medical Record Number: [id number] Patient Account Number: [id number] Date of Birth/Sex: Treating RN: Apr 29, 1961 (62 y.o. M) Primary Care Aadil Sur: SYSTEM, PCP Other Clinician: Referring Verlan Grotz: Treating Ronique Simerly/Extender: Kaylyn Layer in Treatment: 8 HBO Treatment Course Details Treatment Course Number: 1 Ordering [facility]: [name] T Treatments Ordered: otal 40 HBO Treatment Start Date: 12/13/2022 HBO Indication: Chronic Refractory Osteomyelitis to left foot HBO Treatment Details Treatment Number: 6 Patient Type: Outpatient Chamber Type: Monoplace Chamber Serial #: [id number] Treatment Protocol: 2.0 ATA with 90 minutes oxygen, and no air breaks Treatment Details Compression Rate Down: 2.0 psi / minute De-Compression Rate Up: 1.5 psi / minute Air breaks and breathing Decompress Decompress Compress Tx Pressure Begins Reached periods Begins Ends (leave unused spaces blank) Chamber Pressure (ATA 1 2 ------2 1 ) Clock Time (24 hr) 11:31 11:43 - - - - - - 13:13 13:23 Treatment Length: 112 (minutes) Treatment Segments: 4 Vital Signs Capillary Blood Glucose Reference Range: 80 - 120 mg / dl HBO Diabetic Blood Glucose Intervention Range: <131 mg/dl or >387 mg/dl Type: Time Vitals Blood Pulse: Respiratory Temperature: Capillary Blood Glucose Pulse Action Taken: Pressure: Rate: Glucose (mg/dl): Meter #: Oximetry (%) Taken: Pre 11:31 120/78 103 18 98 97 99 2 ensures, crackers, and peanut butter Pre 11:25 152 1 99 none per protocol Post 13:23 118/80 85 18 98 130 1 97 none per protocol Treatment Response Treatment Toleration: Well Treatment Completion Status: Treatment Completed without Adverse Event Treatment Notes Patient initial glucose level low at 97. Per Ilona Colley  treat with ensure and crackers and peanut butter. I checked glucose 30 min after, Results 152. OK to proceed per protocol. Electronic Signature(s) Signed: 12/21/2022 5:47:58 PM By: [name] PA-C Signed: 12/27/2022 9:47:58 AM By: Demetria Pore Entered By: Demetria Pore on 12/21/2022 14:03:24 -------------------------------------------------------------------------------- HBO Safety Checklist Details Patient Name: Date of Service: [name] Sells RNEY L. 12/21/2022 11:00 A M Medical Record Number: [id number] Patient Account Number: [id number] Date of Birth/Sex: Treating RN: 1960-11-16 (62 y.o. M) Primary Care Gwen Edler: SYSTEM, PCP Other Clinician: Referring Nitya Cauthon: Treating Anquanette Bahner/Extender: Kaylyn Layer in Treatment: [address] [place] ([id number]) [id number] Page 2 of 2 HBO Safety Checklist Items Safety Checklist Consent Form Signed Patient voided / foley secured and emptied When did you last eato 12/21/22 AM in office crackers peanut butter Last dose of injectable or oral agent n/a Ostomy pouch emptied and vented if applicable NA All implantable devices assessed, documented and approved NA Intravenous access site secured and place NA Valuables secured Linens and cotton and cotton/polyester blend (less than 51% polyester) Personal oil-based products / skin lotions / body lotions removed Wigs or hairpieces removed NA Smoking or tobacco materials removed NA Books / newspapers / magazines / loose paper removed Cologne, aftershave, perfume and deodorant removed Jewelry removed (may wrap wedding band) Make-up removed NA Hair care products removed Battery operated devices (external) removed NA Heating patches and chemical warmers removed NA Titanium eyewear removed NA Nail polish cured greater than 10 hours NA Casting material cured greater than 10 hours NA Hearing aids removed NA Loose dentures or partials removed NA Prosthetics  have been removed NA Patient demonstrates correct use of air break device (if applicable) Patient concerns have been addressed Patient grounding bracelet on and cord attached to chamber Specifics for Inpatients (complete in addition to above) Medication  sheet sent with patient Intravenous medications needed or due during therapy sent with patient Drainage tubes (e.g. nasogastric tube or chest tube secured and vented) Endotracheal or Tracheotomy tube secured Cuff deflated of air and inflated with saline Airway suctioned Electronic Signature(s) Signed: 12/27/2022 9:47:58 AM By: Demetria Pore Entered By: Demetria Pore on 12/21/2022 14:01:35

## 2022-12-29 ENCOUNTER — Encounter: Payer: Medicare Other | Admitting: Physician Assistant

## 2023-01-01 ENCOUNTER — Encounter: Payer: Medicare Other | Admitting: Physician Assistant

## 2023-01-02 ENCOUNTER — Encounter: Payer: Medicare Other | Admitting: Physician Assistant

## 2023-01-02 DIAGNOSIS — E1169 Type 2 diabetes mellitus with other specified complication: Secondary | ICD-10-CM | POA: Diagnosis not present

## 2023-01-02 DIAGNOSIS — M86372 Chronic multifocal osteomyelitis, left ankle and foot: Secondary | ICD-10-CM | POA: Diagnosis present

## 2023-01-02 DIAGNOSIS — E11621 Type 2 diabetes mellitus with foot ulcer: Secondary | ICD-10-CM | POA: Diagnosis not present

## 2023-01-02 DIAGNOSIS — L97524 Non-pressure chronic ulcer of other part of left foot with necrosis of bone: Secondary | ICD-10-CM | POA: Diagnosis not present

## 2023-01-02 DIAGNOSIS — M14672 Charcot's joint, left ankle and foot: Secondary | ICD-10-CM | POA: Diagnosis not present

## 2023-01-02 DIAGNOSIS — L97512 Non-pressure chronic ulcer of other part of right foot with fat layer exposed: Secondary | ICD-10-CM | POA: Diagnosis not present

## 2023-01-02 DIAGNOSIS — F17218 Nicotine dependence, cigarettes, with other nicotine-induced disorders: Secondary | ICD-10-CM | POA: Diagnosis not present

## 2023-01-02 DIAGNOSIS — I87313 Chronic venous hypertension (idiopathic) with ulcer of bilateral lower extremity: Secondary | ICD-10-CM | POA: Diagnosis not present

## 2023-01-02 DIAGNOSIS — Z89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: Secondary | ICD-10-CM | POA: Diagnosis not present

## 2023-01-02 DIAGNOSIS — Z89421 Acquired absence of other right toe(s): Secondary | ICD-10-CM | POA: Diagnosis not present

## 2023-01-02 LAB — GLUCOSE, CAPILLARY
Glucose-Capillary: 136 mg/dL — ABNORMAL HIGH (ref 70–99)
Glucose-Capillary: 107 mg/dL — ABNORMAL HIGH (ref 70–99)

## 2023-01-02 NOTE — Progress Notes (Signed)
ADRION, STICKROD ([id number]) [id number] Page 1 of 2 Visit Report for 01/02/2023 Arrival Information Details Patient Name: Date of Service: [name] [name], [name] [name] 01/02/2023 10:30 A M Medical Record Number: [id number] Patient Account Number: [id number] Date of Birth/Sex: Treating RN: Jun 29, 1961 (62 y.o. M) Primary Care [name] [name]: SYSTEM, PCP Other Clinician: Referring [name] [name]: Treating [name] [name]/Extender: [name] [name] in Treatment: 9 Visit Information History Since Last Visit Added or deleted any medications: No Patient Arrived: Ambulatory Any new allergies or adverse reactions: No Arrival Time: 11:23 Had a fall or experienced change in No Accompanied By: self activities of daily living that may affect Transfer Assistance: None risk of falls: Patient Requires Transmission-Based Precautions: No Signs or symptoms of abuse/neglect since last visito No Patient Has Alerts: Yes Hospitalized since last visit: No Patient Alerts: Diabetes Type 2 Pain Present Now: No R ABI 1.03 [name] Electronic Signature(s) Signed: 01/02/2023 1:05:31 PM By: [name] [name] Entered By: [name] [name] on 01/02/2023 13:02:07 -------------------------------------------------------------------------------- Encounter Discharge Information Details Patient Name: Date of Service: [name] Sells RNEY [name] 01/02/2023 10:30 A M Medical Record Number: [id number] Patient Account Number: [id number] Date of Birth/Sex: Treating RN: [birth year] (62 y.o. M) Primary Care [name] [name]: SYSTEM, PCP Other Clinician: Referring [name] [name]: Treating [name] [name]/Extender: [name] [name] in Treatment: 9 Encounter Discharge Information Items Discharge Condition: Stable Ambulatory Status: Ambulatory Discharge Destination: Home Transportation: Private Auto Accompanied By: self Schedule Follow-up Appointment: Yes Clinical Summary of Care: Electronic Signature(s) Signed: 01/02/2023 1:05:31 PM  By: [name] [name] Entered By: [name] [name] on 01/02/2023 13:04:25 -------------------------------------------------------------------------------- Vitals Details Patient Name: Date of Service: [name] Sells RNEY [name] 01/02/2023 10:30 A M Medical Record Number: [id number] Patient Account Number: [id number] Date of Birth/Sex: Treating RN: 1961-04-16 (62 y.o. M) Primary Care Karry [name]: SYSTEM, PCP Other Clinician: Referring [name] [name]: Treating [name] [name]/Extender: [name] [name] in Treatment: 9 Vital Signs Time Taken: 10:18 Temperature (F): 98.1 Height (in): 76 Pulse (bpm): 72 Weight (lbs): 232 Respiratory Rate (breaths/min): 18 Body Mass Index (BMI): 28.2 Blood Pressure (mmHg): 120/80 Capillary Blood Glucose (mg/dl): 629 [name] [name], [name] [name] ([id number]) [id number] Page 2 of 2 Reference Range: 80 - 120 mg / dl Airway Pulse Oximetry (%): 98 Electronic Signature(s) Signed: 01/02/2023 1:05:31 PM By: [name] [name] Entered By: [name] [name] on 01/02/2023 13:02:17

## 2023-01-03 ENCOUNTER — Encounter: Payer: Medicare Other | Admitting: Internal Medicine

## 2023-01-03 DIAGNOSIS — E11621 Type 2 diabetes mellitus with foot ulcer: Secondary | ICD-10-CM | POA: Diagnosis not present

## 2023-01-03 LAB — GLUCOSE, CAPILLARY
Glucose-Capillary: 152 mg/dL — ABNORMAL HIGH (ref 70–99)
Glucose-Capillary: 96 mg/dL (ref 70–99)

## 2023-01-03 NOTE — Progress Notes (Signed)
PHILO, GLESSNER ([id number]) [id number] Page 1 of 2 Visit Report for 01/03/2023 Arrival Information Details Patient Name: Date of Service: [name] [name], [name] [name] 01/03/2023 10:30 A M Medical Record Number: [id number] Patient Account Number: [id number] Date of Birth/Sex: Treating RN: [birth year] (62 y.o. M) Primary Care Shivansh Hardaway: SYSTEM, PCP Other Clinician: Referring Anida Deol: Treating Ryver Poblete/Extender: RO BSO N, MICHA EL Lynelle Smoke Weeks in Treatment: 9 Visit Information History Since Last Visit Added or deleted any medications: No Patient Arrived: Ambulatory Any new allergies or adverse reactions: No Arrival Time: 11:43 Had a fall or experienced change in No Accompanied By: self activities of daily living that may affect Transfer Assistance: None risk of falls: Patient Identification Verified: Yes Signs or symptoms of abuse/neglect since last visito No Secondary Verification Process Completed: Yes Hospitalized since last visit: No Patient Requires Transmission-Based Precautions: No Implantable device outside of the clinic excluding No Patient Has Alerts: Yes cellular tissue based products placed in the center Patient Alerts: Diabetes Type 2 since last visit: R ABI 1.03 [name] Pain Present Now: No Electronic Signature(s) Signed: 01/03/2023 12:53:35 PM By: Demetria Pore Entered By: Demetria Pore on 01/03/2023 12:49:58 -------------------------------------------------------------------------------- Encounter Discharge Information Details Patient Name: Date of Service: [name] Sells RNEY [name] 01/03/2023 10:30 A M Medical Record Number: [id number] Patient Account Number: [id number] Date of Birth/Sex: Treating RN: 1961/05/14 (62 y.o. M) Primary Care Jurnei Latini: SYSTEM, PCP Other Clinician: Referring Zondra Lawlor: Treating Jonan Seufert/Extender: RO BSO N, MICHA EL Jennette Bill in Treatment: 9 Encounter Discharge Information Items Discharge Condition:  Stable Ambulatory Status: Ambulatory Discharge Destination: Home Transportation: Private Auto Accompanied By: self Schedule Follow-up Appointment: Yes Clinical Summary of Care: Electronic Signature(s) Signed: 01/03/2023 12:53:35 PM By: Demetria Pore Entered By: Demetria Pore on 01/03/2023 12:53:12 -------------------------------------------------------------------------------- Vitals Details Patient Name: Date of Service: [name] Sells RNEY [name] 01/03/2023 10:30 A M Medical Record Number: [id number] Patient Account Number: [id number] Date of Birth/Sex: Treating RN: 11-07-1960 (62 y.o. M) Primary Care Mystique Bjelland: SYSTEM, PCP Other Clinician: Referring Adalberto Metzgar: Treating Rhyan Radler/Extender: RO BSO N, MICHA EL Lynelle Smoke Weeks in Treatment: 9 Vital Signs Time Taken: 10:25 Temperature (F): 98.1 Height (in): 76 Pulse (bpm): 94 [name] [name], [name] [name] ([id number]) [id number] Page 2 of 2 Weight (lbs): 232 Respiratory Rate (breaths/min): 18 Body Mass Index (BMI): 28.2 Blood Pressure (mmHg): 122/80 Capillary Blood Glucose (mg/dl): 244 Reference Range: 80 - 120 mg / dl Airway Pulse Oximetry (%): 98 Electronic Signature(s) Signed: 01/03/2023 12:53:35 PM By: Demetria Pore Entered By: Demetria Pore on 01/03/2023 12:50:01

## 2023-01-03 NOTE — Progress Notes (Signed)
CORT, KOIS ([id number]) [id number] Page 1 of 2 Visit Report for 01/02/2023 HBO Details Patient Name: Date of Service: [name] [name], [name] [name] 01/02/2023 10:30 A M Medical Record Number: [id number] Patient Account Number: [id number] Date of Birth/Sex: Treating RN: November 26, 1960 (62 y.o. M) Primary Care Lynise Porr: SYSTEM, PCP Other Clinician: Referring Suzzanne Brunkhorst: Treating Ajia Chadderdon/Extender: Kaylyn Layer in Treatment: 9 HBO Treatment Course Details Treatment Course Number: 1 Ordering Kingdavid Leinbach: [name] T Treatments Ordered: otal 40 HBO Treatment Start Date: 12/13/2022 HBO Indication: Chronic Refractory Osteomyelitis to left foot HBO Treatment Details Treatment Number: 8 Patient Type: Outpatient Chamber Type: Monoplace Chamber Serial #: [id number] Treatment Protocol: 2.0 ATA with 90 minutes oxygen, and no air breaks Treatment Details Compression Rate Down: 2.0 psi / minute De-Compression Rate Up: 1.5 psi / minute Air breaks and breathing Decompress Decompress Compress Tx Pressure Begins Reached periods Begins Ends (leave unused spaces blank) Chamber Pressure (ATA 1 2 ------2 1 ) Clock Time (24 hr) 10:36 10:48 - - - - - - 12:20 12:30 Treatment Length: 114 (minutes) Treatment Segments: 4 Vital Signs Capillary Blood Glucose Reference Range: 80 - 120 mg / dl HBO Diabetic Blood Glucose Intervention Range: <131 mg/dl or >253 mg/dl Type: Time Vitals Blood Respiratory Capillary Blood Glucose Pulse Action Pulse: Temperature: Taken: Pressure: Rate: Glucose (mg/dl): Meter #: Oximetry (%) Taken: Pre 10:18 120/80 72 18 98.1 136 98 none per protocol Post 12:31 118/80 98 18 97.7 110 1 98 none per protocol Treatment Response Treatment Completion Status: Treatment Completed without Adverse Event Electronic Signature(s) Signed: 01/02/2023 1:05:31 PM By: Demetria Pore Signed: 01/02/2023 5:36:28 PM By: [name] PA-C Entered By: Demetria Pore on  01/02/2023 13:03:55 -------------------------------------------------------------------------------- HBO Safety Checklist Details Patient Name: Date of Service: [name] [name] RNEY [name] 01/02/2023 10:30 A M Medical Record Number: [id number] Patient Account Number: [id number] Date of Birth/Sex: Treating RN: September 20, 1960 (62 y.o. M) Primary Care Kirstein Baxley: SYSTEM, PCP Other Clinician: Referring Akashdeep Chuba: Treating Mekiyah Gladwell/Extender: Kaylyn Layer in Treatment: 9 HBO Safety Checklist Items Safety Checklist Consent Form Signed Patient voided / foley secured and emptied [name] [name] ([id number]) [id number] Page 2 of 2 When did you last eato 01/02/23 Last dose of injectable or oral agent n/a Ostomy pouch emptied and vented if applicable NA All implantable devices assessed, documented and approved NA Intravenous access site secured and place NA Valuables secured Linens and cotton and cotton/polyester blend (less than 51% polyester) Personal oil-based products / skin lotions / body lotions removed Wigs or hairpieces removed NA Smoking or tobacco materials removed Books / newspapers / magazines / loose paper removed Cologne, aftershave, perfume and deodorant removed Jewelry removed (may wrap wedding band) Make-up removed NA Hair care products removed Battery operated devices (external) removed Heating patches and chemical warmers removed NA Titanium eyewear removed NA Nail polish cured greater than 10 hours NA Casting material cured greater than 10 hours NA Hearing aids removed NA Loose dentures or partials removed NA Prosthetics have been removed NA Patient demonstrates correct use of air break device (if applicable) Patient concerns have been addressed Patient grounding bracelet on and cord attached to chamber Specifics for Inpatients (complete in addition to above) Medication sheet sent with patient Intravenous medications needed or  due during therapy sent with patient Drainage tubes (e.g. nasogastric tube or chest tube secured and vented) Endotracheal or Tracheotomy tube secured Cuff deflated of air and inflated with saline Airway suctioned Electronic Signature(s) Signed: 01/02/2023 1:05:31 PM By: Demetria Pore Entered By: Demetria Pore on  01/02/2023 13:02:23 

## 2023-01-04 ENCOUNTER — Encounter: Payer: Medicare Other | Admitting: Physician Assistant

## 2023-01-04 DIAGNOSIS — E11621 Type 2 diabetes mellitus with foot ulcer: Secondary | ICD-10-CM | POA: Diagnosis not present

## 2023-01-04 LAB — GLUCOSE, CAPILLARY
Glucose-Capillary: 103 mg/dL — ABNORMAL HIGH (ref 70–99)
Glucose-Capillary: 139 mg/dL — ABNORMAL HIGH (ref 70–99)

## 2023-01-04 NOTE — Progress Notes (Signed)
CRUIZE, SHAULIS ([id number]) [id number] Page 1 of 2 Visit Report for 01/03/2023 HBO Details Patient Name: Date of Service: [name] [name], [name] [name] 01/03/2023 10:30 A M Medical Record Number: [id number] Patient Account Number: [id number] Date of Birth/Sex: Treating RN: [birth year] (62 y.o. M) Primary Care Mac Dowdell: SYSTEM, PCP Other Clinician: Referring Shalae Belmonte: Treating Emeril Stille/Extender: RO BSO N, MICHA EL Lynelle Smoke Weeks in Treatment: 9 HBO Treatment Course Details Treatment Course Number: 1 Ordering Amery Minasyan: [name] T Treatments Ordered: otal 40 HBO Treatment Start Date: 12/13/2022 HBO Indication: Chronic Refractory Osteomyelitis to left foot HBO Treatment Details Treatment Number: 9 Patient Type: Outpatient Chamber Type: Monoplace Chamber Serial #: [id number] Treatment Protocol: 2.0 ATA with 90 minutes oxygen, and no air breaks Treatment Details Compression Rate Down: 2.0 psi / minute De-Compression Rate Up: 1.5 psi / minute Air breaks and breathing Decompress Decompress Compress Tx Pressure Begins Reached periods Begins Ends (leave unused spaces blank) Chamber Pressure (ATA 1 2 ------2 1 ) Clock Time (24 hr) 10:25 10:36 - - - - - - 12:06 12:17 Treatment Length: 112 (minutes) Treatment Segments: 4 Vital Signs Capillary Blood Glucose Reference Range: 80 - 120 mg / dl HBO Diabetic Blood Glucose Intervention Range: <131 mg/dl or >324 mg/dl Type: Time Vitals Blood Pulse: Respiratory Temperature: Capillary Blood Glucose Pulse Action Taken: Pressure: Rate: Glucose (mg/dl): Meter #: Oximetry (%) Taken: Pre 10:25 122/80 94 18 98.1 152 98 none per protocol Post 12:18 118/80 92 18 98 96 1 98 gave ensure waited 15 min took glucose again Post 12:28 105 1 none per protocol proceed per MD Treatment Response Treatment Toleration: Well Treatment Completion Status: Treatment Completed without Adverse Event Rose-Marie Hickling Notes No concerns with treatment  given HBO Attestation I certify that I supervised this HBO treatment in accordance with Medicare guidelines. A trained emergency response team is readily available per Yes hospital policies and procedures. Continue HBOT as ordered. Yes Electronic Signature(s) Signed: 01/03/2023 4:41:35 PM By: Baltazar Najjar MD Previous Signature: 01/03/2023 12:53:35 PM Version By: Demetria Pore Entered By: Baltazar Najjar on 01/03/2023 16:16:39 -------------------------------------------------------------------------------- HBO Safety Checklist Details Patient Name: Date of Service: [name] [name] RNEY L. 01/03/2023 10:30 A [name] [name] ([id number]) [id number] Page 2 of 2 Medical Record Number: [id number] Patient Account Number: [id number] Date of Birth/Sex: Treating RN: 06-01-1961 (62 y.o. M) Primary Care Carlisle Enke: SYSTEM, PCP Other Clinician: Referring Analeya Luallen: Treating Bennie Chirico/Extender: RO BSO N, MICHA EL Lynelle Smoke Weeks in Treatment: 9 HBO Safety Checklist Items Safety Checklist Consent Form Signed Patient voided / foley secured and emptied When did you last eato 01/03/23 Last dose of injectable or oral agent n/a Ostomy pouch emptied and vented if applicable NA All implantable devices assessed, documented and approved NA Intravenous access site secured and place NA Valuables secured Linens and cotton and cotton/polyester blend (less than 51% polyester) Personal oil-based products / skin lotions / body lotions removed Wigs or hairpieces removed NA Smoking or tobacco materials removed Books / newspapers / magazines / loose paper removed Cologne, aftershave, perfume and deodorant removed Jewelry removed (may wrap wedding band) Make-up removed NA Hair care products removed Battery operated devices (external) removed NA Heating patches and chemical warmers removed NA Titanium eyewear removed NA Nail polish cured greater than 10 hours NA Casting material  cured greater than 10 hours NA Hearing aids removed NA Loose dentures or partials removed NA Prosthetics have been removed NA Patient demonstrates correct use of air break device (if applicable) Patient concerns have been addressed Patient grounding  bracelet on and cord attached to chamber Specifics for Inpatients (complete in addition to above) Medication sheet sent with patient Intravenous medications needed or due during therapy sent with patient Drainage tubes (e.g. nasogastric tube or chest tube secured and vented) Endotracheal or Tracheotomy tube secured Cuff deflated of air and inflated with saline Airway suctioned Electronic Signature(s) Signed: 01/03/2023 12:53:35 PM By: Demetria Pore Entered By: Demetria Pore on 01/03/2023 12:50:05

## 2023-01-04 NOTE — Progress Notes (Signed)
KAILUB, YOPP ([id number]) [id number] Page 1 of 2 Visit Report for 01/04/2023 Arrival Information Details Patient Name: Date of Service: [name] [name], [name] [name] 01/04/2023 10:30 A M Medical Record Number: [id number] Patient Account Number: [id number] Date of Birth/Sex: Treating RN: 17-Oct-1960 (62 y.o. M) Primary Care Mily Malecki: SYSTEM, PCP Other Clinician: Referring Muneeb Veras: Treating Judea Fennimore/Extender: Kaylyn Layer in Treatment: 10 Visit Information History Since Last Visit Added or deleted any medications: No Patient Arrived: Ambulatory Any new allergies or adverse reactions: No Arrival Time: 10:58 Had a fall or experienced change in No Accompanied By: self activities of daily living that may affect Transfer Assistance: None risk of falls: Patient Identification Verified: Yes Signs or symptoms of abuse/neglect since last visito No Secondary Verification Process Completed: Yes Hospitalized since last visit: No Patient Requires Transmission-Based Precautions: No Implantable device outside of the clinic excluding No Patient Has Alerts: Yes cellular tissue based products placed in the center Patient Alerts: Diabetes Type 2 since last visit: R ABI 1.03 [name] Pain Present Now: No Electronic Signature(s) Signed: 01/04/2023 1:44:10 PM By: Demetria Pore Entered By: Demetria Pore on 01/04/2023 13:40:56 -------------------------------------------------------------------------------- Encounter Discharge Information Details Patient Name: Date of Service: [name] Sells RNEY [name] 01/04/2023 10:30 A M Medical Record Number: [id number] Patient Account Number: [id number] Date of Birth/Sex: Treating RN: 02/18/1961 (62 y.o. M) Primary Care Taylorann Tkach: SYSTEM, PCP Other Clinician: Referring Carliss Quast: Treating Adi Seales/Extender: Kaylyn Layer in Treatment: 10 Encounter Discharge Information Items Discharge Condition: Stable Ambulatory Status:  Ambulatory Discharge Destination: Home Transportation: Private Auto Accompanied By: self Schedule Follow-up Appointment: No Clinical Summary of Care: Electronic Signature(s) Signed: 01/04/2023 1:44:10 PM By: Demetria Pore Entered By: Demetria Pore on 01/04/2023 13:43:47 -------------------------------------------------------------------------------- Vitals Details Patient Name: Date of Service: [name] Sells RNEY [name] 01/04/2023 10:30 A M Medical Record Number: [id number] Patient Account Number: [id number] Date of Birth/Sex: Treating RN: 1960/11/15 (62 y.o. M) Primary Care Ifeanyichukwu Wickham: SYSTEM, PCP Other Clinician: Referring Elfriede Bonini: Treating Sofiah Lyne/Extender: Kaylyn Layer in Treatment: 10 Vital Signs Time Taken: 10:21 Temperature (F): 97.8 Height (in): 76 Pulse (bpm): 102 [name] [name], [name] [name] ([id number]) [id number] Page 2 of 2 Weight (lbs): 232 Respiratory Rate (breaths/min): 18 Body Mass Index (BMI): 28.2 Blood Pressure (mmHg): 120/80 Capillary Blood Glucose (mg/dl): 536 Reference Range: 80 - 120 mg / dl Airway Pulse Oximetry (%): 98 Electronic Signature(s) Signed: 01/04/2023 1:44:10 PM By: Demetria Pore Entered By: Demetria Pore on 01/04/2023 13:40:59

## 2023-01-04 NOTE — Progress Notes (Addendum)
AKON, BERTANI ([id number]) [id number] Page 1 of 8 Visit Report for 01/04/2023 Chief Complaint Document Details Patient Name: Date of Service: [name] [name], [name] [name] 01/04/2023 9:30 A M Medical Record Number: [id number] Patient Account Number: [id number] Date of Birth/Sex: Treating RN: October 19, 1960 (62 y.o. Roel Cluck Primary Care Provider: SYSTEM, PCP Other Clinician: Midge Aver Referring Provider: Treating Provider/Extender: Kaylyn Layer in Treatment: 10 Information Obtained from: Patient Chief Complaint Bilateral Foot Ulcers Electronic Signature(s) Signed: 01/04/2023 3:10:23 PM By: Midge Aver MSN RN CNS WTA Signed: 01/04/2023 4:04:13 PM By: [name] PA-C Previous Signature: 01/04/2023 1:37:39 PM Version By: [name] PA-C Entered By: Midge Aver on 01/04/2023 13:46:16 -------------------------------------------------------------------------------- Debridement Details Patient Name: Date of Service: [name] Sells RNEY L. 01/04/2023 9:30 A M Medical Record Number: [id number] Patient Account Number: [id number] Date of Birth/Sex: Treating RN: 1960-10-14 (62 y.o. Roel Cluck Primary Care Provider: SYSTEM, PCP Other Clinician: Midge Aver Referring Provider: Treating Provider/Extender: Kaylyn Layer in Treatment: 10 Debridement Performed for Assessment: Wound #1 Left,Anterior T Second oe Performed By: Physician [name], PA-C Debridement Type: Debridement Severity of Tissue Pre Debridement: Fat layer exposed Level of Consciousness (Pre-procedure): Awake and Alert Pre-procedure Verification/Time Out Yes - 13:42 Taken: Start Time: 13:42 Percent of Wound Bed Debrided: 100% T Area Debrided (cm): otal 0.07 Tissue and other material debrided: Viable, Non-Viable, Bone, Slough, Subcutaneous, Slough Level: Skin/Subcutaneous Tissue/Muscle/Bone Debridement Description: Excisional Instrument: Curette Bleeding:  Minimum Hemostasis Achieved: Pressure Procedural Pain: 0 Post Procedural Pain: 0 Response to Treatment: Procedure was tolerated well Level of Consciousness (Post- Awake and Alert procedure): Post Debridement Measurements of Total Wound Length: (cm) 0.3 Width: (cm) 0.3 Depth: (cm) 0.4 Volume: (cm) 0.028 Character of Wound/Ulcer Post Debridement: Stable Severity of Tissue Post Debridement: Fat layer exposed Post Procedure Diagnosis Same as Pre-procedure Electronic Signature(s) Signed: 01/04/2023 3:10:23 PM By: Midge Aver MSN RN CNS WTA Signed: 01/04/2023 4:04:13 PM By: [place], Guss Bunde ([id number]) [id number] Page 2 of 8 Entered By: Midge Aver on 01/04/2023 13:43:23 -------------------------------------------------------------------------------- HPI Details Patient Name: Date of Service: [name] [name], [name] [name] 01/04/2023 9:30 A M Medical Record Number: [id number] Patient Account Number: [id number] Date of Birth/Sex: Treating RN: 1961/06/07 (62 y.o. Roel Cluck Primary Care Provider: SYSTEM, PCP Other Clinician: Midge Aver Referring Provider: Treating Provider/Extender: Kaylyn Layer in Treatment: 10 History of Present Illness HPI Description: 10-26-2022 upon evaluation today patient presents for initial inspection here in our clinic concerning issues that he has been having with wounds over the right dorsal foot and left distal toe on the dorsal surface more so. With that being said he has had an MRI of the right foot which showed no signs of osteomyelitis I see no x-rays nor MRI of the left foot at all at this point. Nonetheless the left foot actually is the one that appears there could be evidence of infection in the toe that is the 1 I am most concerned about. He is also going to require debridement of both locations I discussed that with him today. The patient's MRI was actually on 10-22-2022 which showed  cellulitis of the right foot but no osteomyelitis. Has been on cefadroxil which was prescribed on 10-25-2022. With that being said it is appearing to me that the patient likely has a infection in regard to the second toe left foot where his other wound is and this is the one that has been most concerned. He actually has bone exposed I  am going to perform debridement I want to obtain samples as well to send for pathology and culture. Patient has a past medical history which is significant for diabetes mellitus type 2 with his most recent hemoglobin A1c being 5.1. 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 He also has amputation of the right second toe. The patient also has significant past medical history for chronic venous hypertension. Patient's ABIs were performed on 10-07-2022 showed a right ABI of 1.03 in the left ABI of 1.10. 3/7; this was a patient who is new to our clinic last week. He has a wound on his left second toe anteriorly and the right dorsal foot. Earlier this week I received the culture report and organism responsible for the infection. I was not even sure how did identify this. The bone culture from last week showed osteomyelitis on the left second toe.. The area on the right dorsal foot looks a lot better no evidence of infection 11-24-2022 upon evaluation today patient appears to be doing poorly in regard to his left second toe although the first toe right foot actually looks better in my opinion. I did review his pathology and culture as well and it showed bone necrosis but no definitive osteomyelitis. The culture nonetheless did show with the bone there was bacteria isolated. He has been on the Bactrim although that was for 2 weeks from Dr. Leanord Hawking this is the first time I am seeing him since that point. Honestly I am not certain if we should continue that or if he needs something different I did actually order an x-ray  unfortunately there was confusion and in the end we did not end up getting the x-ray. This was on the left foot and interned it was presumed that this was done but what was actually sent which is the x-ray of the right foot which is not what we needed. He has had an x-ray and MRI of the right foot neither of the left foot. For that reason I would have to send him today for the left foot x-ray. 12-06-2021 upon evaluation today patient appears to be doing about the same in regard to his toe ulcer. We have subsequently up to this point been treating him for infection. We do have confirmation based on what we see currently with the MRI that he still is continuing to have issues right now with the infection of his toes. There is actually 2 locations 1 that has a wound 1 that does not. With that being said he has been on the Bactrim since 12 February. I think that that has helped to some degree to keep this under control but not fully. He still is taking that at this point and I am actually can renew that today for him to continue the plan. He is in agreement with that plan. Nonetheless he is going to require some additional help to get this close I do believe that I believe for limb salvage it would benefit him to proceed with HBO therapy. We have discussed this previously and loose detail now in greater detail today. 12-15-2022 upon evaluation today patient appears to be doing well currently in regard to his wound. He has been tolerating the dressing changes without complication. Fortunately there does not appear to be any signs of active infection systemically which is great news. 12-21-2022 upon evaluation today patient appears to be doing well currently in  regard to his toe ulcer though this is going require some debridement today. Fortunately I do not see any signs of active infection locally nor systemically which is great news. 01-04-2023 upon evaluation today patient appears to be doing well currently in  regard to his wound. He has been tolerating the dressing changes without complication. He does require some sharp debridement today but we are going to go ahead and work on this for him as quickly as possible and try to get things moving along using hyperbarics and does seem to be tolerating that quite well. Electronic Signature(s) Signed: 01/04/2023 1:48:27 PM By: [name] PA-C Entered By: [name] on 01/04/2023 13:48:27 -------------------------------------------------------------------------------- Physical Exam Details Patient Name: Date of Service: [name] [name], [name] [name] RNEY L. 01/04/2023 9:30 A M Medical Record Number: [id number] Patient Account Number: [id number] Date of Birth/Sex: Treating RN: Jul 13, 1961 (62 y.o. Roel Cluck Primary Care Provider: SYSTEM, PCP Other Clinician: Midge Aver Referring Provider: Treating Provider/Extender: Kaylyn Layer in Treatment: 10 Constitutional Well-nourished and well-hydrated in no acute distress. KHYLON, EBERLING ([id number]) [id number] Page 3 of 8 Respiratory normal breathing without difficulty. Psychiatric this patient is able to make decisions and demonstrates good insight into disease process. Alert and Oriented x 3. pleasant and cooperative. Notes Upon inspection patient's wound bed actually showed signs of good granulation epithelization at this point. Fortunately there does not appear to be any evidence of active infection locally nor systemically which is great news and overall I am extremely pleased with where we stand currently. I did perform debridement which remove slough and biofilm as well as bone down to better tissue he did have some bleeding. Right now I feel like he is doing quite well. Electronic Signature(s) Signed: 01/04/2023 1:49:01 PM By: [name] PA-C Entered By: [name] on 01/04/2023  13:49:00 -------------------------------------------------------------------------------- Physician Orders Details Patient Name: Date of Service: [name] Sells RNEY L. 01/04/2023 9:30 A M Medical Record Number: [id number] Patient Account Number: [id number] Date of Birth/Sex: Treating RN: 07/11/1961 (62 y.o. Roel Cluck Primary Care Provider: SYSTEM, PCP Other Clinician: Midge Aver Referring Provider: Treating Provider/Extender: Kaylyn Layer in Treatment: 10 Verbal / Phone Orders: No Diagnosis Coding ICD-10 Coding Code Description [phone] Chronic multifocal osteomyelitis, left ankle and foot E11.621 Type 2 diabetes mellitus with foot ulcer L97.524 Non-pressure chronic ulcer of other part of left foot with necrosis of bone L97.512 Non-pressure chronic ulcer of other part of right foot with fat layer exposed M14.672 Charcot's joint, left ankle and foot 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 I87.323 Chronic venous hypertension (idiopathic) with inflammation of bilateral lower extremity F17.218 Nicotine dependence, cigarettes, with other nicotine-induced disorders Follow-up Appointments Return Appointment in 1 week. Home Health [facility] for wound care. May utilize formulary equivalent dressing for wound treatment orders unless otherwise specified. Home Health Nurse may visit PRN to address patients wound care needs. Aldine Contes [phone] Pack silver cel into the small wound on the 2nd toe Scheduled days for dressing changes to be completed; exception, patient has scheduled wound care visit that day. - pt seen weekly in wound clinic for dressing change, patient seen daily for HBO treatments. PLEASE schedule visits for wound dressing changes accordingly Bathing/ Shower/ Hygiene May shower; gently cleanse wound with antibacterial soap, rinse and pat dry prior to dressing wounds Anesthetic (Use 'Patient Medications' Section for Anesthetic Order  Entry) Lidocaine applied to wound bed Edema Control - Lymphedema / Segmental Compressive Device / Other Elevate, Exercise Daily and  A void Standing for Long Periods of Time. Elevate legs to the level of the heart and pump ankles as often as possible Elevate leg(s) parallel to the floor when sitting. Additional Orders / Instructions Decrease/Stop Smoking Hyperbaric Oxygen Therapy Wound #1 Left,Anterior T Second oe Indication and location: - Wagner grade 3 left 2nd toe 2.0 ATA for 90 Minutes without A Breaks ir One treatment per day (delivered Monday through Friday unless otherwise specified in Special Instructions below): Total # of Treatments: - 40 Antihistamine 30 minutes prior to HBO Treatment, difficulty clearing ears. Finger stick Blood Glucose Pre- and Post- HBOT Treatment. Follow Hyperbaric Oxygen Glycemia Protocol TUNG, DUDOIT ([id number]) [id number] Page 4 of 8 Wound Treatment Wound #1 - T Second oe Wound Laterality: Left, Anterior Cleanser: Soap and Water 3 x Per Week/30 Days Discharge Instructions: Gently cleanse wound with antibacterial soap, rinse and pat dry prior to dressing wounds Prim Dressing: Silvercel Small 2x2 (in/in) 3 x Per Week/30 Days ary Discharge Instructions: Apply Silvercel Small 2x2 (in/in) pack into the wound Secondary Dressing: Gauze 3 x Per Week/30 Days Secured With: Medipore T - 58M Medipore H Soft Cloth Surgical T ape ape, 2x2 (in/yd) 3 x Per Week/30 Days GLYCEMIA INTERVENTIONS PROTOCOL PRE-HBO GLYCEMIA INTERVENTIONS ACTION INTERVENTION Obtain pre-HBO capillary blood glucose (ensure 1 physician order is in chart). A. Notify HBO physician and await physician orders. 2 If result is 70 mg/dl or below: B. If the result meets the hospital definition of a critical result, follow hospital policy. A. Give patient an 8 ounce Glucerna Shake, an 8 ounce Ensure, or 8 ounces of a Glucerna/Ensure equivalent  dietary supplement*. B. Wait 30 minutes. If result is 71 mg/dl to 119 mg/dl: C. Retest patients capillary blood glucose (CBG). D. If result greater than or equal to 110 mg/dl, proceed with HBO. If result less than 110 mg/dl, notify HBO physician and consider holding HBO. If result is 131 mg/dl to 147 mg/dl: A. Proceed with HBO. A. Notify HBO physician and await physician orders. B. It is recommended to hold HBO and do If result is 250 mg/dl or greater: blood/urine ketone testing. C. If the result meets the hospital definition of a critical result, follow hospital policy. POST-HBO GLYCEMIA INTERVENTIONS ACTION INTERVENTION Obtain post HBO capillary blood glucose (ensure 1 physician order is in chart). A. Notify HBO physician and await physician orders. 2 If result is 70 mg/dl or below: B. If the result meets the hospital definition of a critical result, follow hospital policy. A. Give patient an 8 ounce Glucerna Shake, an 8 ounce Ensure, or 8 ounces of a Glucerna/Ensure equivalent dietary supplement*. B. Wait 15 minutes for symptoms of If result is 71 mg/dl to 829 mg/dl: hypoglycemia (i.e. nervousness, anxiety, sweating, chills, clamminess, irritability, confusion, tachycardia or dizziness). C. If patient asymptomatic, discharge patient. If patient symptomatic, repeat capillary blood glucose (CBG) and notify HBO physician. If result is 101 mg/dl to 562 mg/dl: A. Discharge patient. A. Notify HBO physician and await physician orders. B. It is recommended to do blood/urine ketone If result is 250 mg/dl or greater: testing. C. If the result meets the hospital definition of a critical result, follow hospital policy. *Juice or candies are NOT equivalent products. If patient refuses the Glucerna or Ensure, please consult the hospital dietitian for an appropriate substitute. Electronic Signature(s) Signed: 01/04/2023 3:10:23 PM By: Midge Aver MSN RN CNS WTA Signed: 01/04/2023  4:04:13 PM By: [name] PA-C Entered By: Midge Aver on 01/04/2023 13:44:46 -------------------------------------------------------------------------------- Problem List  Details Patient Name: Date of Service: ADDIEL, GLEISSNER 01/04/2023 9:30 A M Medical Record Number: [id number] Patient Account Number: [id number] Date of Birth/Sex: Treating RN: [birth year] (62 y.o. Denley, Mccleary, Guss Bunde ([id number]) [id number] Page 5 of 8 Primary Care Provider: SYSTEM, PCP Other Clinician: Midge Aver Referring Provider: Treating Provider/Extender: Kaylyn Layer in Treatment: 10 Active Problems ICD-10 Encounter Code Description Active Date MDM Diagnosis M86.372 Chronic multifocal osteomyelitis, left ankle and foot 12/07/2022 No Yes E11.621 Type 2 diabetes mellitus with foot ulcer 10/26/2022 No Yes L97.524 Non-pressure chronic ulcer of other part of left foot with necrosis of bone 10/26/2022 No Yes L97.512 Non-pressure chronic ulcer of other part of right foot with fat layer exposed 10/26/2022 No Yes M14.672 Charcot's joint, left ankle and foot 10/26/2022 No Yes 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 10/26/2022 No Yes I87.323 Chronic venous hypertension (idiopathic) with inflammation of bilateral lower 10/26/2022 No Yes extremity F17.218 Nicotine dependence, cigarettes, with other nicotine-induced disorders 10/26/2022 No Yes Inactive Problems Resolved Problems Electronic Signature(s) Signed: 01/04/2023 3:10:23 PM By: Midge Aver MSN RN CNS WTA Signed: 01/04/2023 4:04:13 PM By: [name] PA-C Previous Signature: 01/04/2023 1:37:35 PM Version By: [name] PA-C Entered By: Midge Aver on 01/04/2023 13:46:11 -------------------------------------------------------------------------------- Progress Note Details Patient Name: Date of Service: [name] Sells RNEY L. 01/04/2023 9:30 A M Medical Record Number: [id number] Patient Account Number:  [id number] Date of Birth/Sex: Treating RN: 1961/03/29 (62 y.o. Roel Cluck Primary Care Provider: SYSTEM, PCP Other Clinician: Midge Aver Referring Provider: Treating Provider/Extender: Kaylyn Layer in Treatment: 10 Subjective Chief Complaint Information obtained from Patient Bilateral Foot Ulcers History of Present Illness (HPI) 10-26-2022 upon evaluation today patient presents for initial inspection here in our clinic concerning issues that he has been having with wounds over the right dorsal foot and left distal toe on the dorsal surface more so. With that being said he has had an MRI of the right foot which showed no signs of osteomyelitis I ARYEL, MINICUCCI ([id number]) [id number] Page 6 of 8 see no x-rays nor MRI of the left foot at all at this point. Nonetheless the left foot actually is the one that appears there could be evidence of infection in the toe that is the 1 I am most concerned about. He is also going to require debridement of both locations I discussed that with him today. The patient's MRI was actually on 10-22-2022 which showed cellulitis of the right foot but no osteomyelitis. Has been on cefadroxil which was prescribed on 10-25-2022. With that being said it is appearing to me that the patient likely has a infection in regard to the second toe left foot where his other wound is and this is the one that has been most concerned. He actually has bone exposed I am going to perform debridement I want to obtain samples as well to send for pathology and culture. Patient has a past medical history which is significant for diabetes mellitus type 2 with his most recent hemoglobin A1c being 5.1. 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 He also has amputation of the right second toe. The patient also has significant past medical history for chronic venous  hypertension. Patient's ABIs were performed on 10-07-2022 showed a right ABI of 1.03 in the left ABI of 1.10. 3/7; this was a patient who is new to our clinic last week. He  has a wound on his left second toe anteriorly and the right dorsal foot. Earlier this week I received the culture report and organism responsible for the infection. I was not even sure how did identify this. The bone culture from last week showed osteomyelitis on the left second toe.. The area on the right dorsal foot looks a lot better no evidence of infection 11-24-2022 upon evaluation today patient appears to be doing poorly in regard to his left second toe although the first toe right foot actually looks better in my opinion. I did review his pathology and culture as well and it showed bone necrosis but no definitive osteomyelitis. The culture nonetheless did show with the bone there was bacteria isolated. He has been on the Bactrim although that was for 2 weeks from Dr. Leanord Hawking this is the first time I am seeing him since that point. Honestly I am not certain if we should continue that or if he needs something different I did actually order an x-ray unfortunately there was confusion and in the end we did not end up getting the x-ray. This was on the left foot and interned it was presumed that this was done but what was actually sent which is the x-ray of the right foot which is not what we needed. He has had an x-ray and MRI of the right foot neither of the left foot. For that reason I would have to send him today for the left foot x-ray. 12-06-2021 upon evaluation today patient appears to be doing about the same in regard to his toe ulcer. We have subsequently up to this point been treating him for infection. We do have confirmation based on what we see currently with the MRI that he still is continuing to have issues right now with the infection of his toes. There is actually 2 locations 1 that has a wound 1 that does not. With  that being said he has been on the Bactrim since 12 February. I think that that has helped to some degree to keep this under control but not fully. He still is taking that at this point and I am actually can renew that today for him to continue the plan. He is in agreement with that plan. Nonetheless he is going to require some additional help to get this close I do believe that I believe for limb salvage it would benefit him to proceed with HBO therapy. We have discussed this previously and loose detail now in greater detail today. 12-15-2022 upon evaluation today patient appears to be doing well currently in regard to his wound. He has been tolerating the dressing changes without complication. Fortunately there does not appear to be any signs of active infection systemically which is great news. 12-21-2022 upon evaluation today patient appears to be doing well currently in regard to his toe ulcer though this is going require some debridement today. Fortunately I do not see any signs of active infection locally nor systemically which is great news. 01-04-2023 upon evaluation today patient appears to be doing well currently in regard to his wound. He has been tolerating the dressing changes without complication. He does require some sharp debridement today but we are going to go ahead and work on this for him as quickly as possible and try to get things moving along using hyperbarics and does seem to be tolerating that quite well. Objective Constitutional Well-nourished and well-hydrated in no acute distress. Vitals Time Taken: 1:19 PM, Height: 76 in, Weight:  232 lbs, BMI: 28.2, Temperature: 98.0 F, Pulse: 83 bpm, Respiratory Rate: 18 breaths/min, Blood Pressure: 130/80 mmHg, Capillary Blood Glucose: 105 mg/dl, Pulse Oximetry: 99 %. Respiratory normal breathing without difficulty. Psychiatric this patient is able to make decisions and demonstrates good insight into disease process. Alert and  Oriented x 3. pleasant and cooperative. General Notes: Upon inspection patient's wound bed actually showed signs of good granulation epithelization at this point. Fortunately there does not appear to be any evidence of active infection locally nor systemically which is great news and overall I am extremely pleased with where we stand currently. I did perform debridement which remove slough and biofilm as well as bone down to better tissue he did have some bleeding. Right now I feel like he is doing quite well. Integumentary (Hair, Skin) Wound #1 status is Open. Original cause of wound was Pressure Injury. The date acquired was: 10/12/2022. The wound has been in treatment 10 weeks. The wound is located on the Left,Anterior T Second. The wound measures 0.3cm length x 0.3cm width x 0.3cm depth; 0.071cm^2 area and 0.021cm^3 volume. oe There is Fat Layer (Subcutaneous Tissue) exposed. There is a medium amount of serosanguineous drainage noted. There is large (67-100%) red, pink granulation within the wound bed. There is no necrotic tissue within the wound bed. 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 There was a Excisional Skin/Subcutaneous  Tissue/Muscle/Bone Debridement with a total area of 0.07 sq cm performed by [name], PA-C. With the following instrument(s): Curette to remove Viable and Non-Viable tissue/material. Material removed includes Bone,Subcutaneous Tissue, and Slough. No specimens were taken. A time out was conducted at 13:42, prior to the start of the procedure. A Minimum amount of bleeding was controlled with Pressure. The procedure was tolerated well with a pain level of 0 throughout and a pain level of 0 following the procedure. Post Debridement Measurements: 0.3cm length x 0.3cm width x 0.4cm depth; 0.028cm^3 volume. Character of Wound/Ulcer Post Debridement is stable. Severity of Tissue Post Debridement is: Fat layer exposed. Post procedure Diagnosis Wound #1: Same as Pre-Procedure Plan Follow-up Appointments: Return Appointment in 1 week. Home Health: [facility] for wound care. May utilize formulary equivalent dressing for wound treatment orders unless otherwise specified. Home Health Nurse may visit PRN to address patientoos wound care needs. Aldine Contes [phone] Pack silver cel into the small wound on the 2nd toe Scheduled days for dressing changes to be completed; exception, patient has scheduled wound care visit that day. - pt seen weekly in wound clinic for dressing change, patient seen daily for HBO treatments. PLEASE schedule visits for wound dressing changes accordingly Bathing/ Shower/ Hygiene: May shower; gently cleanse wound with antibacterial soap, rinse and pat dry prior to dressing wounds Anesthetic (Use 'Patient Medications' Section for Anesthetic Order Entry): Lidocaine applied to wound bed Edema Control - Lymphedema / Segmental Compressive Device /  Other: Elevate, Exercise Daily and Avoid Standing for Long Periods of Time. Elevate legs to the level of the heart and pump ankles as often as possible Elevate leg(s) parallel to the floor when sitting. Additional Orders /  Instructions: Decrease/Stop Smoking Hyperbaric Oxygen Therapy: Wound #1 Left,Anterior T Second: oe Indication and location: - Wagner grade 3 left 2nd toe 2.0 ATA for 90 Minutes without Air Breaks One treatment per day (delivered Monday through Friday unless otherwise specified in Special Instructions below): T # of Treatments: - 40 otal Antihistamine 30 minutes prior to HBO Treatment, difficulty clearing ears. Finger stick Blood Glucose Pre- and Post- HBOT Treatment. Follow Hyperbaric Oxygen Glycemia Protocol WOUND #1: - T Second Wound Laterality: Left, Anterior oe Cleanser: Soap and Water 3 x Per Week/30 Days Discharge Instructions: Gently cleanse wound with antibacterial soap, rinse and pat dry prior to dressing wounds Prim Dressing: Silvercel Small 2x2 (in/in) 3 x Per Week/30 Days ary Discharge Instructions: Apply Silvercel Small 2x2 (in/in) pack into the wound Secondary Dressing: Gauze 3 x Per Week/30 Days Secured With: Medipore T - 17M Medipore H Soft Cloth Surgical T ape ape, 2x2 (in/yd) 3 x Per Week/30 Days 1. I would recommend currently that the patient should continue to monitor for any signs of infection or worsening. Based on what I am seeing I do believe that we are making good progress here. 2. Also can recommend patient should continue to utilize the silver alginate dressing that should be tucked into the wound not just laid over top. 3. And also can recommend the patient should continue to change this dressing 3 times per week Home health is coming out he needs to make sure that he is there whenever they come to change this. We will see patient back for reevaluation in 1 week here in the clinic. If anything worsens or changes patient will contact our office for additional recommendations. Electronic Signature(s) Signed: 01/04/2023 1:49:31 PM By: [name] PA-C Entered By: [name] on 01/04/2023  13:49:30 -------------------------------------------------------------------------------- SuperBill Details Patient Name: Date of Service: [name] Sells RNEY Elbert Ewings 01/04/2023 [name] ([id number]) [id number] Page 8 of 8 Medical Record Number: [id number] Patient Account Number: [id number] Date of Birth/Sex: Treating RN: [birth year] (62 y.o. Roel Cluck Primary Care Provider: SYSTEM, PCP Other Clinician: Midge Aver Referring Provider: Treating Provider/Extender: Kaylyn Layer in Treatment: 10 Diagnosis Coding ICD-10 Codes Code Description [phone] Chronic multifocal osteomyelitis, left ankle and foot E11.621 Type 2 diabetes mellitus with foot ulcer L97.524 Non-pressure chronic ulcer of other part of left foot with necrosis of bone L97.512 Non-pressure chronic ulcer of other part of right foot with fat layer exposed M14.672 Charcot's joint, left ankle and foot 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 I87.323 Chronic venous hypertension (idiopathic) with inflammation of bilateral lower extremity F17.218 Nicotine dependence, cigarettes, with other nicotine-induced disorders Facility Procedures : CPT4 Code: [id number] Description: 11044 - DEB BONE 20 SQ CM/< ICD-10 Diagnosis Description L97.524 Non-pressure chronic ulcer of other part of left foot with necrosis of bo Modifier: ne Quantity: 1 Physician Procedures : CPT4: Description Modifier Code [id number] Debridement; bone (includes epidermis, dermis, subQ tissue, muscle and/or fascia, if performed) 1st 20 sqcm or less ICD-10 Diagnosis Description L97.524 Non-pressure chronic ulcer of other part of left foot with  necrosis of bone Quantity: 1 Electronic Signature(s) Signed: 01/04/2023 1:49:57 PM By: [name] PA-C Entered By: [name] on 01/04/2023 13:49:56

## 2023-01-04 NOTE — Progress Notes (Signed)
KIMAHRI, CHEUVRONT ([id number]) [id number] Page 1 of 7 Visit Report for 01/04/2023 Arrival Information Details Patient Name: Date of Service: TRAIDEN, COOMER 01/04/2023 9:30 A M Medical Record Number: [id number] Patient Account Number: [id number] Date of Birth/Sex: Treating RN: 10/25/1960 (62 y.o. Roel Cluck Primary Care Stefhanie Kachmar: SYSTEM, PCP Other Clinician: Midge Aver Referring Otho Michalik: Treating Malissia Rabbani/Extender: Kaylyn Layer in Treatment: 10 Visit Information History Since Last Visit Added or deleted any medications: No Patient Arrived: Ambulatory Has Dressing in Place as Prescribed: Yes Arrival Time: 13:16 Pain Present Now: No Accompanied By: self Transfer Assistance: None Patient Identification Verified: Yes Secondary Verification Process Completed: Yes Patient Requires Transmission-Based Precautions: No Patient Has Alerts: Yes Patient Alerts: Diabetes Type 2 R ABI 1.03 L 1.10 Electronic Signature(s) Signed: 01/04/2023 3:10:23 PM By: Midge Aver MSN RN CNS WTA Entered By: Midge Aver on 01/04/2023 13:16:31 -------------------------------------------------------------------------------- Clinic Level of Care Assessment Details Patient Name: Date of Service: ORDIE, BADGETT RNEY L. 01/04/2023 9:30 A M Medical Record Number: [id number] Patient Account Number: [id number] Date of Birth/Sex: Treating RN: [birth year] (62 y.o. Roel Cluck Primary Care Brinley Rosete: SYSTEM, PCP Other Clinician: Midge Aver Referring Valkyrie Guardiola: Treating Lenka Zhao/Extender: Kaylyn Layer in Treatment: 10 Clinic Level of Care Assessment Items TOOL 1 Quantity Score []  - 0 Use when EandM and Procedure is performed on INITIAL visit ASSESSMENTS - Nursing Assessment / Reassessment []  - 0 General Physical Exam (combine w/ comprehensive assessment (listed just below) when performed on new pt. evals) []  - 0 Comprehensive Assessment  (HX, ROS, Risk Assessments, Wounds Hx, etc.) ASSESSMENTS - Wound and Skin Assessment / Reassessment []  - 0 Dermatologic / Skin Assessment (not related to wound area) ASSESSMENTS - Ostomy and/or Continence Assessment and Care []  - 0 Incontinence Assessment and Management []  - 0 Ostomy Care Assessment and Management (repouching, etc.) PROCESS - Coordination of Care []  - 0 Simple Patient / Family Education for ongoing care []  - 0 Complex (extensive) Patient / Family Education for ongoing care []  - 0 Staff obtains Chiropractor, Records, T Results / Process Orders est []  - 0 Staff telephones HHA, Nursing Homes / Clarify orders / etc []  - 0 Routine Transfer to another Facility (non-emergent condition) []  - 0 Routine Hospital Admission (non-emergent condition) []  - 0 New Admissions / Insurance Authorizations / Ordering NPWT Apligraf, etc. , []  - 0 Emergency Hospital Admission (emergent condition) MILTON, LANIER L ([id number]) [id number] Page 2 of 7 PROCESS - Special Needs []  - 0 Pediatric / Minor Patient Management []  - 0 Isolation Patient Management []  - 0 Hearing / Language / Visual special needs []  - 0 Assessment of Community assistance (transportation, D/C planning, etc.) []  - 0 Additional assistance / Altered mentation []  - 0 Support Surface(s) Assessment (bed, cushion, seat, etc.) INTERVENTIONS - Miscellaneous []  - 0 External ear exam []  - 0 Patient Transfer (multiple staff / Nurse, adult / Similar devices) []  - 0 Simple Staple / Suture removal (25 or less) []  - 0 Complex Staple / Suture removal (26 or more) []  - 0 Hypo/Hyperglycemic Management (do not check if billed separately) []  - 0 Ankle / Brachial Index (ABI) - do not check if billed separately Has the patient been seen at the hospital within the last three years: Yes Total Score: 0 Level Of Care: ____ Electronic Signature(s) Signed: 01/04/2023 3:10:23 PM By: Midge Aver MSN RN CNS  WTA Entered By: Midge Aver on 01/04/2023 13:44:54 -------------------------------------------------------------------------------- Encounter Discharge Information Details Patient Name: Date of Service:  Strandberg, BA RNEY L. 01/04/2023 9:30 A M Medical Record Number: [id number] Patient Account Number: [id number] Date of Birth/Sex: Treating RN: 1961/07/15 (62 y.o. Roel Cluck Primary Care Adem Costlow: SYSTEM, PCP Other Clinician: Midge Aver Referring Mihaela Fajardo: Treating Jahna Liebert/Extender: Kaylyn Layer in Treatment: 10 Encounter Discharge Information Items Post Procedure Vitals Discharge Condition: Stable Temperature (F): 98.0 Ambulatory Status: Ambulatory Pulse (bpm): 83 Discharge Destination: Home Respiratory Rate (breaths/min): 16 Transportation: Private Auto Blood Pressure (mmHg): 130/80 Accompanied By: self Schedule Follow-up Appointment: Yes Clinical Summary of Care: Electronic Signature(s) Signed: 01/04/2023 3:10:23 PM By: Midge Aver MSN RN CNS WTA Entered By: Midge Aver on 01/04/2023 14:06:03 -------------------------------------------------------------------------------- Lower Extremity Assessment Details Patient Name: Date of Service: [name] 01/04/2023 9:30 A M Medical Record Number: [id number] Patient Account Number: [id number] Date of Birth/Sex: Treating RN: 1961-08-16 (62 y.o. Roel Cluck Primary Care Kaidan Spengler: SYSTEM, PCP Other Clinician: Midge Aver Referring Saliou Barnier: Treating Jc Veron/Extender: Maryellen Pile Weeks in Treatment: 10 Edema Assessment Assessed: Kyra Searles: Yes] Franne Forts: No] [Left: Edema] Franne Forts: :] W[LeftDietrich Pates ([id number])] [Right: [id number] Page 3 of 7] Calf Left: Right: Point of Measurement: 37 cm From Medial Instep 36.5 cm Ankle Left: Right: Point of Measurement: 11 cm From Medial Instep 23.2 cm Vascular Assessment Pulses: Dorsalis Pedis Palpable:  [Left:Yes] Electronic Signature(s) Signed: 01/04/2023 3:10:23 PM By: Midge Aver MSN RN CNS WTA Entered By: Midge Aver on 01/04/2023 13:28:56 -------------------------------------------------------------------------------- Multi Wound Chart Details Patient Name: Date of Service: [name] 01/04/2023 9:30 A M Medical Record Number: [id number] Patient Account Number: [id number] Date of Birth/Sex: Treating RN: 06/08/1961 (62 y.o. Roel Cluck Primary Care Ricarda Atayde: SYSTEM, PCP Other Clinician: Midge Aver Referring Benuel Ly: Treating Leamon Palau/Extender: Kaylyn Layer in Treatment: 10 Vital Signs Height(in): 76 Capillary Blood Glucose(mg/dl): 295 Weight(lbs): 188 Pulse(bpm): 83 Body Mass Index(BMI): 28.2 Blood Pressure(mmHg): 130/80 Temperature(F): 98.0 Respiratory Rate(breaths/min): 18 [1:Photos:] [N/A:N/A] Left, Anterior T Second oe N/A N/A Wound Location: Pressure Injury N/A N/A Wounding Event: Diabetic Wound/Ulcer of the Lower N/A N/A Primary Etiology: Extremity Peripheral Venous Disease, Type II N/A N/A Comorbid History: Diabetes, Osteomyelitis 10/12/2022 N/A N/A Date Acquired: 10 N/A N/A Weeks of Treatment: Open N/A N/A Wound Status: No N/A N/A Wound Recurrence: Yes N/A N/A Pending A mputation on Presentation: 0.3x0.3x0.3 N/A N/A Measurements L x W x D (cm) 0.071 N/A N/A A (cm) : rea 0.021 N/A N/A Volume (cm) : 92.50% N/A N/A % Reduction in A rea: 95.60% N/A N/A % Reduction in Volume: Grade 3 N/A N/A Classification: Medium N/A N/A Exudate A mount: Serosanguineous N/A N/A Exudate Type: red, brown N/A N/A Exudate Color: Large (67-100%) N/A N/A Granulation A mount: Red, Pink N/A N/A Granulation Quality: None Present (0%) N/A N/A Necrotic A mount: Fat Layer (Subcutaneous Tissue): Yes N/A N/A Exposed Structures: Fascia: No Tendon: No GIOVAN, ARONOFF L ([id number]) [id number] Page 4 of  7 Muscle: No Joint: No Bone: No None N/A N/A Epithelialization: Treatment Notes Electronic Signature(s) Signed: 01/04/2023 3:10:23 PM By: Midge Aver MSN RN CNS WTA Entered By: Midge Aver on 01/04/2023 13:29:50 -------------------------------------------------------------------------------- Multi-Disciplinary Care Plan Details Patient Name: Date of Service: [name] 01/04/2023 9:30 A M Medical Record Number: [id number] Patient Account Number: [id number] Date of Birth/Sex: Treating RN: 04/07/1961 (62 y.o. Roel Cluck Primary Care Keylen Uzelac: SYSTEM, PCP Other Clinician: Midge Aver Referring Chassidy Layson: Treating Malaya Cagley/Extender: Kaylyn Layer in Treatment: 10 Active Inactive Necrotic Tissue Nursing Diagnoses: Impaired tissue integrity related to  necrotic/devitalized tissue Knowledge deficit related to management of necrotic/devitalized tissue Goals: Necrotic/devitalized tissue will be minimized in the wound bed Date Initiated: 10/26/2022 Target Resolution Date: 01/26/2023 Goal Status: Active Patient/caregiver will verbalize understanding of reason and process for debridement of necrotic tissue Date Initiated: 10/26/2022 Date Inactivated: 11/24/2022 Target Resolution Date: 11/24/2022 Goal Status: Met Interventions: Assess patient pain level pre-, during and post procedure and prior to discharge Provide education on necrotic tissue and debridement process Treatment Activities: Excisional debridement : 10/26/2022 T ordered outside of clinic : 10/26/2022 est Notes: Wound/Skin Impairment Nursing Diagnoses: Impaired tissue integrity Knowledge deficit related to smoking impact on wound healing Knowledge deficit related to ulceration/compromised skin integrity Goals: Patient will demonstrate a reduced rate of smoking or cessation of smoking Date Initiated: 10/26/2022 Date Inactivated: 11/24/2022 Target Resolution Date: 11/24/2022 Goal Status:  Met Patient will have a decrease in wound volume by X% from date: (specify in notes) Date Initiated: 10/26/2022 Date Inactivated: 01/04/2023 Target Resolution Date: 11/24/2022 Goal Status: Met Patient/caregiver will verbalize understanding of skin care regimen Date Initiated: 10/26/2022 Date Inactivated: 01/04/2023 Target Resolution Date: 12/25/2022 Goal Status: Met Ulcer/skin breakdown will have a volume reduction of 30% by week 4 Date Initiated: 10/26/2022 Date Inactivated: 11/24/2022 Target Resolution Date: 11/24/2022 Goal Status: Unmet Unmet Reason: comoridities Ulcer/skin breakdown will have a volume reduction of 50% by week 8 Date Initiated: 10/26/2022 Target Resolution Date: 01/26/2023 Goal Status: Active Ulcer/skin breakdown will have a volume reduction of 80% by week 12 Lim, Kaidyn L ([id number]) [id number] Page 5 of 7 Date Initiated: 10/26/2022 Target Resolution Date: 02/24/2023 Goal Status: Active Ulcer/skin breakdown will heal within 14 weeks Date Initiated: 10/26/2022 Target Resolution Date: 03/09/2023 Goal Status: Active Interventions: Assess patient/caregiver ability to obtain necessary supplies Assess patient/caregiver ability to perform ulcer/skin care regimen upon admission and as needed Assess ulceration(s) every visit Provide education on smoking Provide education on ulcer and skin care Treatment Activities: Skin care regimen initiated : 10/26/2022 Smoking cessation education : 10/26/2022 Notes: Electronic Signature(s) Signed: 01/04/2023 3:10:23 PM By: Midge Aver MSN RN CNS WTA Entered By: Midge Aver on 01/04/2023 13:45:51 -------------------------------------------------------------------------------- Pain Assessment Details Patient Name: Date of Service: [name] 01/04/2023 9:30 A M Medical Record Number: [id number] Patient Account Number: [id number] Date of Birth/Sex: Treating RN: 1960-10-29 (62 y.o. Roel Cluck Primary Care  Kathy Wares: SYSTEM, PCP Other Clinician: Midge Aver Referring Laverne Hursey: Treating Athanasia Stanwood/Extender: Kaylyn Layer in Treatment: 10 Active Problems Location of Pain Severity and Description of Pain Patient Has Paino No Site Locations Pain Management and Medication Current Pain Management: Electronic Signature(s) Signed: 01/04/2023 3:10:23 PM By: Midge Aver MSN RN CNS WTA Entered By: Midge Aver on 01/04/2023 13:19:59 -------------------------------------------------------------------------------- Patient/Caregiver Education Details Patient Name: Date of Service: Ashley Jacobs 5/9/2024andnbsp9:30 A M Medical Record Number: [id number] Patient Account Number: [id number] CALLIE, QUIROS ([id number]) [id number] Page 6 of 7 Date of Birth/Gender: Treating RN: 1961-07-15 (62 y.o. Roel Cluck Primary Care Physician: SYSTEM, PCP Other Clinician: Midge Aver Referring Physician: Treating Physician/Extender: Kaylyn Layer in Treatment: 10 Education Assessment Education Provided To: Patient Education Topics Provided Wound/Skin Impairment: Handouts: Caring for Your Ulcer Methods: Explain/Verbal Responses: State content correctly Electronic Signature(s) Signed: 01/04/2023 3:10:23 PM By: Midge Aver MSN RN CNS WTA Entered By: Midge Aver on 01/04/2023 13:46:03 -------------------------------------------------------------------------------- Wound Assessment Details Patient Name: Date of Service: [name] 01/04/2023 9:30 A M Medical Record Number: [id number] Patient Account Number: [id number] Date of Birth/Sex: Treating RN: [birth year] (62  y.o. Roel Cluck Primary Care Johanne Mcglade: SYSTEM, PCP Other Clinician: Midge Aver Referring Mckenize Mezera: Treating Zaidin Blyden/Extender: Kaylyn Layer in Treatment: 10 Wound Status Wound Number: 1 Primary Etiology: Diabetic Wound/Ulcer of the  Lower Extremity Wound Location: Left, Anterior T Second oe Wound Status: Open Wounding Event: Pressure Injury Comorbid Peripheral Venous Disease, Type II Diabetes, History: Osteomyelitis Date Acquired: 10/12/2022 Weeks Of Treatment: 10 Clustered Wound: No Pending Amputation On Presentation Photos Wound Measurements Length: (cm) 0.3 Width: (cm) 0.3 Depth: (cm) 0.3 Area: (cm) 0.071 Volume: (cm) 0.021 % Reduction in Area: 92.5% % Reduction in Volume: 95.6% Epithelialization: None Wound Description Classification: Grade 3 Exudate Amount: Medium Exudate Type: Serosanguineous Exudate Color: red, brown Foul Odor After Cleansing: No Slough/Fibrino Yes Wound Bed Granulation Amount: Large (67-100%) Exposed WHITT, HAVARD L ([id number]) [id number] Page 7 of 7 Granulation Quality: Red, Pink Fascia Exposed: No Necrotic Amount: None Present (0%) Fat Layer (Subcutaneous Tissue) Exposed: Yes Tendon Exposed: No Muscle Exposed: No Joint Exposed: No Bone Exposed: No Treatment Notes Wound #1 (Toe Second) Wound Laterality: Left, Anterior Cleanser Soap and Water Discharge Instruction: Gently cleanse wound with antibacterial soap, rinse and pat dry prior to dressing wounds Peri-Wound Care Topical Primary Dressing Silvercel Small 2x2 (in/in) Discharge Instruction: Apply Silvercel Small 2x2 (in/in) pack into the wound Secondary Dressing Gauze Secured With Medipore T - 63M Medipore H Soft Cloth Surgical T ape ape, 2x2 (in/yd) Compression Wrap Compression Stockings Add-Ons Electronic Signature(s) Signed: 01/04/2023 3:10:23 PM By: Midge Aver MSN RN CNS WTA Entered By: Midge Aver on 01/04/2023 13:27:19 -------------------------------------------------------------------------------- Vitals Details Patient Name: Date of Service: [name] 01/04/2023 9:30 A M Medical Record Number: [id number] Patient Account Number: [id number] Date of  Birth/Sex: Treating RN: 1960-09-17 (62 y.o. Roel Cluck Primary Care Malichi Palardy: SYSTEM, PCP Other Clinician: Midge Aver Referring Alayziah Tangeman: Treating Mariadejesus Cade/Extender: Kaylyn Layer in Treatment: 10 Vital Signs Time Taken: 13:19 Temperature (F): 98.0 Height (in): 76 Pulse (bpm): 83 Weight (lbs): 232 Respiratory Rate (breaths/min): 18 Body Mass Index (BMI): 28.2 Blood Pressure (mmHg): 130/80 Capillary Blood Glucose (mg/dl): 865 Reference Range: 80 - 120 mg / dl Airway Pulse Oximetry (%): 99 Electronic Signature(s) Signed: 01/04/2023 3:10:23 PM By: Midge Aver MSN RN CNS WTA Entered By: Midge Aver on 01/04/2023 13:19:53

## 2023-01-04 NOTE — Progress Notes (Signed)
KONGMENG, PULKRABEK ([id number]) [id number] Page 1 of 2 Visit Report for 01/04/2023 HBO Details Patient Name: Date of Service: [name] [name], [name] [name] 01/04/2023 10:30 A M Medical Record Number: [id number] Patient Account Number: [id number] Date of Birth/Sex: Treating RN: July 20, 1961 (62 y.o. M) Primary Care Smrithi Pigford: SYSTEM, PCP Other Clinician: Referring Sandy Haye: Treating Evanna Washinton/Extender: Kaylyn Layer in Treatment: 10 HBO Treatment Course Details Treatment Course Number: 1 Ordering Tyhir Schwan: [name] T Treatments Ordered: otal 40 HBO Treatment Start Date: 12/13/2022 HBO Indication: Chronic Refractory Osteomyelitis to left foot HBO Treatment Details Treatment Number: 10 Patient Type: Outpatient Chamber Type: Monoplace Chamber Serial #: [id number] Treatment Protocol: 2.0 ATA with 90 minutes oxygen, and no air breaks Treatment Details Compression Rate Down: 2.0 psi / minute De-Compression Rate Up: 1.5 psi / minute Air breaks and breathing Decompress Decompress Compress Tx Pressure Begins Reached periods Begins Ends (leave unused spaces blank) Chamber Pressure (ATA 1 2 ------2 1 ) Clock Time (24 hr) 11:11 11:23 - - - - - - 12:54 13:04 Treatment Length: 113 (minutes) Treatment Segments: 4 Vital Signs Capillary Blood Glucose Reference Range: 80 - 120 mg / dl HBO Diabetic Blood Glucose Intervention Range: <131 mg/dl or >528 mg/dl Type: Time Vitals Blood Pulse: Respiratory Temperature: Capillary Blood Glucose Pulse Action Taken: Pressure: Rate: Glucose (mg/dl): Meter #: Oximetry (%) Taken: Pre 10:21 120/80 102 18 97.8 103 98 2 ensures, crackers, and peanut butter Pre 11:05 137 1 none per protocol proceed per MD Post 13:04 130/80 83 18 98 105 1 99 none per protocol proceed per MD Treatment Response Treatment Toleration: Well Treatment Completion Status: Treatment Completed without Adverse Event Electronic Signature(s) Signed: 01/04/2023  1:44:10 PM By: Demetria Pore Signed: 01/04/2023 4:04:13 PM By: [name] PA-C Entered By: Demetria Pore on 01/04/2023 13:43:20 -------------------------------------------------------------------------------- HBO Safety Checklist Details Patient Name: Date of Service: [name] [name] RNEY L. 01/04/2023 10:30 A M Medical Record Number: [id number] Patient Account Number: [id number] Date of Birth/Sex: Treating RN: 01/29/1961 (62 y.o. M) Primary Care Leasha Goldberger: SYSTEM, PCP Other Clinician: Referring Anari Evitt: Treating Whitaker Holderman/Extender: Kaylyn Layer in Treatment: 10 HBO Safety Checklist Items Safety Checklist TREVEYON, REPLOGLE L ([id number]) [id number] Page 2 of 2 Consent Form Signed Patient voided / foley secured and emptied When did you last eato 01/04/23 Last dose of injectable or oral agent n/a Ostomy pouch emptied and vented if applicable NA All implantable devices assessed, documented and approved NA Intravenous access site secured and place NA Valuables secured Linens and cotton and cotton/polyester blend (less than 51% polyester) Personal oil-based products / skin lotions / body lotions removed Wigs or hairpieces removed NA Smoking or tobacco materials removed Books / newspapers / magazines / loose paper removed Cologne, aftershave, perfume and deodorant removed Jewelry removed (may wrap wedding band) Make-up removed NA Hair care products removed Battery operated devices (external) removed NA Heating patches and chemical warmers removed NA Titanium eyewear removed NA Nail polish cured greater than 10 hours NA Casting material cured greater than 10 hours NA Hearing aids removed NA Loose dentures or partials removed NA Prosthetics have been removed NA Patient demonstrates correct use of air break device (if applicable) Patient concerns have been addressed Patient grounding bracelet on and cord attached to chamber Specifics for  Inpatients (complete in addition to above) Medication sheet sent with patient Intravenous medications needed or due during therapy sent with patient Drainage tubes (e.g. nasogastric tube or chest tube secured and vented) Endotracheal or Tracheotomy tube secured Cuff deflated of air  and inflated with saline Airway suctioned Electronic Signature(s) Signed: 01/04/2023 1:44:10 PM By: Demetria Pore Entered By: Demetria Pore on 01/04/2023 13:41:03

## 2023-01-05 ENCOUNTER — Encounter: Payer: Medicare Other | Admitting: Physician Assistant

## 2023-01-05 DIAGNOSIS — E11621 Type 2 diabetes mellitus with foot ulcer: Secondary | ICD-10-CM | POA: Diagnosis not present

## 2023-01-05 LAB — GLUCOSE, CAPILLARY
Glucose-Capillary: 105 mg/dL — ABNORMAL HIGH (ref 70–99)
Glucose-Capillary: 155 mg/dL — ABNORMAL HIGH (ref 70–99)
Glucose-Capillary: 115 mg/dL — ABNORMAL HIGH (ref 70–99)

## 2023-01-05 NOTE — Progress Notes (Signed)
[name] [name], [name] [name] ([id number]) [id number] Page 1 of 2 Visit Report for 01/05/2023 HBO Details Patient Name: Date of Service: [name] [name], [name] [name] 01/05/2023 10:30 A M Medical Record Number: [id number] Patient Account Number: [id number] Date of Birth/Sex: Treating RN: 12-Jul-1961 (62 y.o. M) Primary Care [name] [name]: SYSTEM, PCP Other Clinician: Referring [name] [name]: Treating [name] [name]: [name] [name] in Treatment: 10 HBO Treatment Course Details Treatment Course Number: 1 Ordering [name] [name]: [name] [name] T Treatments Ordered: otal 40 HBO Treatment Start Date: 12/13/2022 HBO Indication: Chronic Refractory Osteomyelitis to left foot HBO Treatment Details Treatment Number: 11 Patient Type: Outpatient Chamber Type: Monoplace Chamber Serial #: [id number] Treatment Protocol: 2.0 ATA with 90 minutes oxygen, and no air breaks Treatment Details Compression Rate Down: 2.0 psi / minute De-Compression Rate Up: 1.5 psi / minute Air breaks and breathing Decompress Decompress Compress Tx Pressure Begins Reached periods Begins Ends (leave unused spaces blank) Chamber Pressure (ATA 1 2 ------2 1 ) Clock Time (24 hr) 10:34 10:44 - - - - - - 12:10 12:24 Treatment Length: 110 (minutes) Treatment Segments: 4 Vital Signs Capillary Blood Glucose Reference Range: 80 - 120 mg / dl HBO Diabetic Blood Glucose Intervention Range: <131 mg/dl or >324 mg/dl Type: Time Vitals Blood Respiratory Capillary Blood Glucose Pulse Action Pulse: Temperature: Taken: Pressure: Rate: Glucose (mg/dl): Meter #: Oximetry (%) Taken: Pre 12:15 140/80 89 18 97.9 185 98 none per protocol Post 12:24 132/80 88 18 98.1 115 1 98 none per protocol Treatment Response Treatment Toleration: Well Treatment Completion Status: Treatment Completed without Adverse Event Electronic Signature(s) Signed: 01/05/2023 12:27:00 PM By: [name] [name] Signed: 01/05/2023 1:20:49 PM By: [name] Derry  PA-C Entered By: [name] [name] on 01/05/2023 12:25:28 -------------------------------------------------------------------------------- HBO Safety Checklist Details Patient Name: Date of Service: [name] [name] [name] L. 01/05/2023 10:30 A M Medical Record Number: [id number] Patient Account Number: [id number] Date of Birth/Sex: Treating RN: 10/22/1960 (62 y.o. M) Primary Care [name] [name]: SYSTEM, PCP Other Clinician: Referring [name] [name]: Treating [name] [name]/Extender: [name] [name] in Treatment: 10 HBO Safety Checklist Items Safety Checklist Consent Form Signed [name] [name], [name] [name] ([id number]) [id number] Page 2 of 2 Patient voided / foley secured and emptied When did you last eato 01/05/23 Last dose of injectable or oral agent n/a Ostomy pouch emptied and vented if applicable NA All implantable devices assessed, documented and approved NA Intravenous access site secured and place NA Valuables secured Linens and cotton and cotton/polyester blend (less than 51% polyester) Personal oil-based products / skin lotions / body lotions removed Wigs or hairpieces removed NA Smoking or tobacco materials removed NA Books / newspapers / magazines / loose paper removed NA Cologne, aftershave, perfume and deodorant removed Jewelry removed (may wrap wedding band) Make-up removed NA Hair care products removed Battery operated devices (external) removed NA Heating patches and chemical warmers removed NA Titanium eyewear removed NA Nail polish cured greater than 10 hours NA Casting material cured greater than 10 hours NA Hearing aids removed NA Loose dentures or partials removed NA Prosthetics have been removed Patient demonstrates correct use of air break device (if applicable) Patient concerns have been addressed Patient grounding bracelet on and cord attached to chamber Specifics for Inpatients (complete in addition to above) Medication sheet sent with  patient Intravenous medications needed or due during therapy sent with patient Drainage tubes (e.g. nasogastric tube or chest tube secured and vented) Endotracheal or Tracheotomy tube secured Cuff deflated of air and inflated with saline Airway suctioned Electronic Signature(s) Signed: 01/05/2023 12:27:00 PM By: [name] [name]  Entered By: [name] [name] on 01/05/2023 12:22:59

## 2023-01-05 NOTE — Progress Notes (Signed)
FRED, ADJEI ([id number]) [id number] Page 1 of 2 Visit Report for 01/05/2023 Arrival Information Details Patient Name: Date of Service: [name] [name], [name] [name] 01/05/2023 10:30 A M Medical Record Number: [id number] Patient Account Number: [id number] Date of Birth/Sex: Treating RN: [birth year] (62 y.o. M) Primary Care Jaxsun Ciampi: SYSTEM, PCP Other Clinician: Referring Dannica Bickham: Treating Lawson Mahone/Extender: Kaylyn Layer in Treatment: 10 Visit Information History Since Last Visit Added or deleted any medications: No Patient Arrived: Ambulatory Any new allergies or adverse reactions: No Arrival Time: 12:11 Had a fall or experienced change in No Accompanied By: self activities of daily living that may affect Transfer Assistance: None risk of falls: Patient Identification Verified: Yes Signs or symptoms of abuse/neglect since last visito No Secondary Verification Process Completed: Yes Hospitalized since last visit: No Patient Requires Transmission-Based Precautions: No Implantable device outside of the clinic excluding No Patient Has Alerts: Yes cellular tissue based products placed in the center Patient Alerts: Diabetes Type 2 since last visit: R ABI 1.03 [name] Pain Present Now: No Electronic Signature(s) Signed: 01/05/2023 12:27:00 PM By: Demetria Pore Entered By: Demetria Pore on 01/05/2023 12:21:33 -------------------------------------------------------------------------------- Encounter Discharge Information Details Patient Name: Date of Service: [name] Sells RNEY [name] 01/05/2023 10:30 A M Medical Record Number: [id number] Patient Account Number: [id number] Date of Birth/Sex: Treating RN: 11-Sep-1960 (62 y.o. M) Primary Care Abrar Koone: SYSTEM, PCP Other Clinician: Referring Breezie Micucci: Treating Kellis Topete/Extender: Kaylyn Layer in Treatment: 10 Encounter Discharge Information Items Discharge Condition: Stable Ambulatory  Status: Ambulatory Discharge Destination: Home Transportation: Private Auto Accompanied By: self Schedule Follow-up Appointment: Yes Clinical Summary of Care: Electronic Signature(s) Signed: 01/05/2023 12:27:00 PM By: Demetria Pore Entered By: Demetria Pore on 01/05/2023 12:26:39 -------------------------------------------------------------------------------- Vitals Details Patient Name: Date of Service: [name] Sells RNEY [name] 01/05/2023 10:30 A M Medical Record Number: [id number] Patient Account Number: [id number] Date of Birth/Sex: Treating RN: Jul 10, 1961 (62 y.o. M) Primary Care Merilee Wible: SYSTEM, PCP Other Clinician: Referring Kobe Ofallon: Treating Davin Archuletta/Extender: Kaylyn Layer in Treatment: 10 Vital Signs Time Taken: 12:15 Temperature (F): 97.9 Height (in): 76 Pulse (bpm): 89 [name] [name], [name] [name] ([id number]) [id number] Page 2 of 2 Weight (lbs): 232 Respiratory Rate (breaths/min): 18 Body Mass Index (BMI): 28.2 Blood Pressure (mmHg): 140/80 Capillary Blood Glucose (mg/dl): 322 Reference Range: 80 - 120 mg / dl Airway Pulse Oximetry (%): 98 Electronic Signature(s) Signed: 01/05/2023 12:27:00 PM By: Demetria Pore Entered By: Demetria Pore on 01/05/2023 12:22:14

## 2023-01-08 ENCOUNTER — Encounter: Payer: Medicaid Other | Admitting: Physician Assistant

## 2023-01-09 ENCOUNTER — Encounter: Payer: Medicare Other | Admitting: Physician Assistant

## 2023-01-09 DIAGNOSIS — E11621 Type 2 diabetes mellitus with foot ulcer: Secondary | ICD-10-CM | POA: Diagnosis not present

## 2023-01-09 LAB — GLUCOSE, CAPILLARY
Glucose-Capillary: 154 mg/dL — ABNORMAL HIGH (ref 70–99)
Glucose-Capillary: 107 mg/dL — ABNORMAL HIGH (ref 70–99)

## 2023-01-09 NOTE — Progress Notes (Signed)
RASHID, KNOPP ([id number]) [id number] Page 1 of 2 Visit Report for 01/09/2023 HBO Details Patient Name: Date of Service: PONO, MONTAN 01/09/2023 10:30 A M Medical Record Number: [id number] Patient Account Number: [id number] Date of Birth/Sex: Treating RN: Dec 03, 1960 (62 y.o. M) Primary Care Caitlin Ainley: SYSTEM, PCP Other Clinician: Referring Charo Philipp: Treating Sabrin Dunlevy/Extender: Kaylyn Layer in Treatment: 10 HBO Treatment Course Details Treatment Course Number: 1 Ordering Eswin Worrell: [name] T Treatments Ordered: otal 40 HBO Treatment Start Date: 12/13/2022 HBO Indication: Chronic Refractory Osteomyelitis to left foot HBO Treatment Details Treatment Number: 12 Patient Type: Outpatient Chamber Type: Monoplace Chamber Serial #: [id number] Treatment Protocol: 2.0 ATA with 90 minutes oxygen, and no air breaks Treatment Details Compression Rate Down: 2.5 psi / minute De-Compression Rate Up: 1.5 psi / minute Air breaks and breathing Decompress Decompress Compress Tx Pressure Begins Reached periods Begins Ends (leave unused spaces blank) Chamber Pressure (ATA 1 2 ------2 1 ) Clock Time (24 hr) 10:25 10:34 - - - - - - 12:09 12:19 Treatment Length: 114 (minutes) Treatment Segments: 4 Vital Signs Capillary Blood Glucose Reference Range: 80 - 120 mg / dl HBO Diabetic Blood Glucose Intervention Range: <131 mg/dl or >324 mg/dl Type: Time Vitals Blood Respiratory Capillary Blood Glucose Pulse Action Pulse: Temperature: Taken: Pressure: Rate: Glucose (mg/dl): Meter #: Oximetry (%) Taken: Pre 10:25 130/80 128 18 154 1 99 none per protocol Post 12:19 132/80 96 18 98.1 107 1 99 none per protocol Treatment Response Treatment Toleration: Well Treatment Completion Status: Treatment Completed without Adverse Event Electronic Signature(s) Signed: 01/09/2023 4:24:45 PM By: Demetria Pore Signed: 01/09/2023 4:31:12 PM By: [name]  PA-C Entered By: Demetria Pore on 01/09/2023 12:46:33 -------------------------------------------------------------------------------- HBO Safety Checklist Details Patient Name: Date of Service: [name] 01/09/2023 10:30 A M Medical Record Number: [id number] Patient Account Number: [id number] Date of Birth/Sex: Treating RN: June 25, 1961 (62 y.o. M) Primary Care Chelby Salata: SYSTEM, PCP Other Clinician: Referring Jamelle Goldston: Treating Stasha Naraine/Extender: Kaylyn Layer in Treatment: 10 HBO Safety Checklist Items Safety Checklist Consent Form Signed MARICELA, HUMPAL ([id number]) [id number] Page 2 of 2 Patient voided / foley secured and emptied When did you last eato 01/09/23 Last dose of injectable or oral agent Ostomy pouch emptied and vented if applicable NA All implantable devices assessed, documented and approved NA Intravenous access site secured and place NA Valuables secured Linens and cotton and cotton/polyester blend (less than 51% polyester) Personal oil-based products / skin lotions / body lotions removed Wigs or hairpieces removed NA Smoking or tobacco materials removed NA Books / newspapers / magazines / loose paper removed Cologne, aftershave, perfume and deodorant removed Jewelry removed (may wrap wedding band) Make-up removed NA Hair care products removed Battery operated devices (external) removed NA Heating patches and chemical warmers removed NA Titanium eyewear removed NA Nail polish cured greater than 10 hours NA Casting material cured greater than 10 hours NA Hearing aids removed NA Loose dentures or partials removed NA Prosthetics have been removed NA Patient demonstrates correct use of air break device (if applicable) Patient concerns have been addressed Patient grounding bracelet on and cord attached to chamber Specifics for Inpatients (complete in addition to above) Medication sheet sent with  patient Intravenous medications needed or due during therapy sent with patient Drainage tubes (e.g. nasogastric tube or chest tube secured and vented) Endotracheal or Tracheotomy tube secured Cuff deflated of air and inflated with saline Airway suctioned Electronic Signature(s) Signed: 01/09/2023 4:24:45 PM By: Francoise Ceo  By: Demetria Pore on 01/09/2023 12:44:15

## 2023-01-10 ENCOUNTER — Encounter (HOSPITAL_BASED_OUTPATIENT_CLINIC_OR_DEPARTMENT_OTHER): Payer: Medicare Other | Admitting: Internal Medicine

## 2023-01-10 DIAGNOSIS — L97524 Non-pressure chronic ulcer of other part of left foot with necrosis of bone: Secondary | ICD-10-CM

## 2023-01-10 DIAGNOSIS — E11621 Type 2 diabetes mellitus with foot ulcer: Secondary | ICD-10-CM | POA: Diagnosis not present

## 2023-01-10 DIAGNOSIS — M86372 Chronic multifocal osteomyelitis, left ankle and foot: Secondary | ICD-10-CM

## 2023-01-10 DIAGNOSIS — L97512 Non-pressure chronic ulcer of other part of right foot with fat layer exposed: Secondary | ICD-10-CM

## 2023-01-10 LAB — GLUCOSE, CAPILLARY
Glucose-Capillary: 100 mg/dL — ABNORMAL HIGH (ref 70–99)
Glucose-Capillary: 148 mg/dL — ABNORMAL HIGH (ref 70–99)
Glucose-Capillary: 162 mg/dL — ABNORMAL HIGH (ref 70–99)

## 2023-01-11 ENCOUNTER — Encounter: Payer: Medicare Other | Admitting: Physician Assistant

## 2023-01-11 DIAGNOSIS — E11621 Type 2 diabetes mellitus with foot ulcer: Secondary | ICD-10-CM | POA: Diagnosis not present

## 2023-01-11 LAB — GLUCOSE, CAPILLARY
Glucose-Capillary: 154 mg/dL — ABNORMAL HIGH (ref 70–99)
Glucose-Capillary: 89 mg/dL (ref 70–99)

## 2023-01-12 ENCOUNTER — Encounter: Payer: Medicare Other | Admitting: Physician Assistant

## 2023-01-12 NOTE — Progress Notes (Signed)
[name] [name] ([id number]) [id number] Page 1 of 2 Visit Report for 01/10/2023 HBO Details Patient Name: Date of Service: [name] [name], [name] [name] 01/10/2023 10:30 A M Medical Record Number: [id number] Patient Account Number: [id number] Date of Birth/Sex: Treating RN: 1960-10-05 (62 y.o. M) Primary Care [name] [name]: SYSTEM, PCP Other Clinician: Referring [name] [name]: Treating [name] [name]/Extender: Mallie Snooks in Treatment: 10 HBO Treatment Course Details Treatment Course Number: 1 Ordering [name] [name]: [name] [name] Treatments Ordered: otal 40 HBO Treatment Start Date: 12/13/2022 HBO Indication: Chronic Refractory Osteomyelitis to left foot HBO Treatment Details Treatment Number: 13 Patient Type: Outpatient Chamber Type: Monoplace Chamber Serial #: [id number] Treatment Protocol: 2.0 ATA with 90 minutes oxygen, and no air breaks Treatment Details Compression Rate Down: 2.0 psi / minute De-Compression Rate Up: 1.5 psi / minute Air breaks and breathing Decompress Decompress Compress Tx Pressure Begins Reached periods Begins Ends (leave unused spaces blank) Chamber Pressure (ATA 1 2 ------2 1 ) Clock Time (24 hr) 11:19 11:31 - - - - - - 11:59 12:09 Treatment Length: 50 (minutes) Treatment Segments: 2 Vital Signs Capillary Blood Glucose Reference Range: 80 - 120 mg / dl HBO Diabetic Blood Glucose Intervention Range: <131 mg/dl or >324 mg/dl Type: Time Vitals Blood Pulse: Respiratory Temperature: Capillary Blood Glucose Pulse Action Taken: Pressure: Rate: Glucose (mg/dl): Meter #: Oximetry (%) Taken: Pre 10:41 122/80 110 18 97.8 100 1 99 2 ensures, crackers, and peanut butter Post 12:09 118/80 100 18 98.1 162 1 98 none per protocol Pre 11:15 146 1 none per protocol proceed per MD Treatment Response Treatment Toleration: Well Treatment Completion Status: Treatment Aborted/Not Restarted Reason: Patient Choice Niti Leisure Notes Patient had to use  restroom. Requested to leave and come back tomorrow HBO Attestation I certify that I supervised this HBO treatment in accordance with Medicare guidelines. A trained emergency response team is readily available per Yes hospital policies and procedures. Continue HBOT as ordered. Yes Electronic Signature(s) Signed: 01/10/2023 3:46:29 PM By: [name] Corwin DO Entered By: [name] [name] on 01/10/2023 15:42:07 -------------------------------------------------------------------------------- HBO Safety Checklist Details Patient Name: Date of Service: [name] Sells RNEY L. 01/10/2023 10:30 A [name] [name] ([id number]) [id number] Page 2 of 2 Medical Record Number: [id number] Patient Account Number: [id number] Date of Birth/Sex: Treating RN: 1960/09/27 (62 y.o. M) Primary Care [name] [name]: SYSTEM, PCP Other Clinician: Referring [name] [name]: Treating [name] [name]/Extender: Mallie Snooks in Treatment: 10 HBO Safety Checklist Items Safety Checklist Consent Form Signed Patient voided / foley secured and emptied When did you last eato 01/10/23 Last dose of injectable or oral agent n/a Ostomy pouch emptied and vented if applicable NA All implantable devices assessed, documented and approved NA Intravenous access site secured and place NA Valuables secured Linens and cotton and cotton/polyester blend (less than 51% polyester) Personal oil-based products / skin lotions / body lotions removed Wigs or hairpieces removed NA Smoking or tobacco materials removed Books / newspapers / magazines / loose paper removed Cologne, aftershave, perfume and deodorant removed Jewelry removed (may wrap wedding band) Make-up removed NA Hair care products removed NA Battery operated devices (external) removed NA Heating patches and chemical warmers removed NA Titanium eyewear removed NA Nail polish cured greater than 10 hours NA Casting material cured greater  than 10 hours NA Hearing aids removed NA Loose dentures or partials removed NA Prosthetics have been removed NA Patient demonstrates correct use of air break device (if applicable) Patient concerns have been addressed Patient grounding bracelet on and cord attached to chamber Specifics for  Inpatients (complete in addition to above) Medication sheet sent with patient Intravenous medications needed or due during therapy sent with patient Drainage tubes (e.g. nasogastric tube or chest tube secured and vented) Endotracheal or Tracheotomy tube secured Cuff deflated of air and inflated with saline Airway suctioned Electronic Signature(s) Signed: 01/12/2023 7:50:23 AM By: [name] [name] Entered By: [name] [name] on 01/10/2023 14:18:33

## 2023-01-12 NOTE — Progress Notes (Signed)
CHAYCE, VENZOR ([id number]) [id number] Page 1 of 2 Visit Report for 01/11/2023 HBO Details Patient Name: Date of Service: [name] [name], [name] [name] 01/11/2023 10:30 A M Medical Record Number: [id number] Patient Account Number: [id number] Date of Birth/Sex: Treating RN: [birth year] (62 y.o. M) Primary Care Giulian Goldring: SYSTEM, PCP Other Clinician: Referring Aela Bohan: Treating Armond Cuthrell/Extender: Kaylyn Layer in Treatment: 11 HBO Treatment Course Details Treatment Course Number: 1 Ordering Zakiah Beckerman: [name] T Treatments Ordered: otal 40 HBO Treatment Start Date: 12/13/2022 HBO Indication: Chronic Refractory Osteomyelitis to left foot HBO Treatment Details Treatment Number: 14 Patient Type: Outpatient Chamber Type: Monoplace Chamber Serial #: [id number] Treatment Protocol: 2.0 ATA with 90 minutes oxygen, and no air breaks Treatment Details Compression Rate Down: 2.0 psi / minute De-Compression Rate Up: 1.5 psi / minute Air breaks and breathing Decompress Decompress Compress Tx Pressure Begins Reached periods Begins Ends (leave unused spaces blank) Chamber Pressure (ATA 1 2 ------2 1 ) Clock Time (24 hr) 10:48 10:58 - - - - - - 12:29 12:39 Treatment Length: 111 (minutes) Treatment Segments: 4 Vital Signs Capillary Blood Glucose Reference Range: 80 - 120 mg / dl HBO Diabetic Blood Glucose Intervention Range: <131 mg/dl or >578 mg/dl Type: Time Vitals Blood Respiratory Capillary Blood Glucose Pulse Action Pulse: Temperature: Taken: Pressure: Rate: Glucose (mg/dl): Meter #: Oximetry (%) Taken: Pre 10:35 126/80 96 18 98.1 154 1 98 none per protocol Post 12:39 122/80 98 18 98 89 1 98 none per protocol Treatment Response Treatment Toleration: Well Treatment Completion Status: Treatment Completed without Adverse Event Electronic Signature(s) Signed: 01/11/2023 4:16:15 PM By: Demetria Pore Signed: 01/11/2023 6:23:48 PM By: [name]  PA-C Entered By: Demetria Pore on 01/11/2023 14:59:20 -------------------------------------------------------------------------------- HBO Safety Checklist Details Patient Name: Date of Service: [name] Sells RNEY L. 01/11/2023 10:30 A M Medical Record Number: [id number] Patient Account Number: [id number] Date of Birth/Sex: Treating RN: [birth year] (62 y.o. M) Primary Care Benard Minturn: SYSTEM, PCP Other Clinician: Referring Maleena Eddleman: Treating Raylynne [name]/Extender: Kaylyn Layer in Treatment: 11 HBO Safety Checklist Items Safety Checklist Consent Form Signed [name] [name], [name] [name] ([id number]) [id number] Page 2 of 2 Patient voided / foley secured and emptied When did you last eato 01/11/23 Last dose of injectable or oral agent n/a Ostomy pouch emptied and vented if applicable NA All implantable devices assessed, documented and approved NA Intravenous access site secured and place NA Valuables secured Linens and cotton and cotton/polyester blend (less than 51% polyester) Personal oil-based products / skin lotions / body lotions removed Wigs or hairpieces removed NA Smoking or tobacco materials removed Books / newspapers / magazines / loose paper removed Cologne, aftershave, perfume and deodorant removed Jewelry removed (may wrap wedding band) Make-up removed NA Hair care products removed Battery operated devices (external) removed NA Heating patches and chemical warmers removed NA Titanium eyewear removed NA Nail polish cured greater than 10 hours NA Casting material cured greater than 10 hours NA Hearing aids removed NA Loose dentures or partials removed NA Prosthetics have been removed NA Patient demonstrates correct use of air break device (if applicable) Patient concerns have been addressed Patient grounding bracelet on and cord attached to chamber Specifics for Inpatients (complete in addition to above) Medication sheet sent with  patient Intravenous medications needed or due during therapy sent with patient Drainage tubes (e.g. nasogastric tube or chest tube secured and vented) Endotracheal or Tracheotomy tube secured Cuff deflated of air and inflated with saline Airway suctioned Electronic Signature(s) Signed: 01/11/2023 4:16:15 PM By: Demetria Pore  Entered By: Demetria Pore on 01/11/2023 14:52:50

## 2023-01-15 ENCOUNTER — Encounter: Payer: Medicare Other | Admitting: Physician Assistant

## 2023-01-15 DIAGNOSIS — E11621 Type 2 diabetes mellitus with foot ulcer: Secondary | ICD-10-CM | POA: Diagnosis not present

## 2023-01-15 LAB — GLUCOSE, CAPILLARY
Glucose-Capillary: 115 mg/dL — ABNORMAL HIGH (ref 70–99)
Glucose-Capillary: 153 mg/dL — ABNORMAL HIGH (ref 70–99)
Glucose-Capillary: 126 mg/dL — ABNORMAL HIGH (ref 70–99)

## 2023-01-16 ENCOUNTER — Encounter: Payer: Medicare Other | Admitting: Physician Assistant

## 2023-01-16 DIAGNOSIS — E11621 Type 2 diabetes mellitus with foot ulcer: Secondary | ICD-10-CM | POA: Diagnosis not present

## 2023-01-16 LAB — GLUCOSE, CAPILLARY
Glucose-Capillary: 145 mg/dL — ABNORMAL HIGH (ref 70–99)
Glucose-Capillary: 87 mg/dL (ref 70–99)

## 2023-01-17 ENCOUNTER — Encounter (HOSPITAL_BASED_OUTPATIENT_CLINIC_OR_DEPARTMENT_OTHER): Payer: Medicare Other | Admitting: Internal Medicine

## 2023-01-17 DIAGNOSIS — L97524 Non-pressure chronic ulcer of other part of left foot with necrosis of bone: Secondary | ICD-10-CM

## 2023-01-17 DIAGNOSIS — E11621 Type 2 diabetes mellitus with foot ulcer: Secondary | ICD-10-CM | POA: Diagnosis not present

## 2023-01-17 DIAGNOSIS — M86372 Chronic multifocal osteomyelitis, left ankle and foot: Secondary | ICD-10-CM

## 2023-01-17 DIAGNOSIS — L97512 Non-pressure chronic ulcer of other part of right foot with fat layer exposed: Secondary | ICD-10-CM

## 2023-01-17 LAB — GLUCOSE, CAPILLARY
Glucose-Capillary: 117 mg/dL — ABNORMAL HIGH (ref 70–99)
Glucose-Capillary: 149 mg/dL — ABNORMAL HIGH (ref 70–99)
Glucose-Capillary: 121 mg/dL — ABNORMAL HIGH (ref 70–99)

## 2023-01-17 NOTE — Progress Notes (Signed)
SKYLUR, BEDNER ([id number]) [id number] Page 1 of 2 Visit Report for 01/16/2023 HBO Details Patient Name: Date of Service: [name] [name], [name] [name] 01/16/2023 10:30 A M Medical Record Number: [id number] Patient Account Number: [id number] Date of Birth/Sex: Treating RN: 01-28-1961 (62 y.o. M) Primary Care Ja Ohman: SYSTEM, PCP Other Clinician: Referring Nolin Grell: Treating Zemira Zehring/Extender: Kaylyn Layer in Treatment: 11 HBO Treatment Course Details Treatment Course Number: 1 Ordering Dora Clauss: [name] T Treatments Ordered: otal 40 HBO Treatment Start Date: 12/13/2022 HBO Indication: Chronic Refractory Osteomyelitis to left foot HBO Treatment Details Treatment Number: 16 Patient Type: Outpatient Chamber Type: Monoplace Chamber Serial #: [id number] Treatment Protocol: 2.0 ATA with 90 minutes oxygen, and no air breaks Treatment Details Compression Rate Down: 2.0 psi / minute De-Compression Rate Up: 1.5 psi / minute Air breaks and breathing Decompress Decompress Compress Tx Pressure Begins Reached periods Begins Ends (leave unused spaces blank) Chamber Pressure (ATA 1 2 ------2 1 ) Clock Time (24 hr) 10:26 10:36 - - - - - - 12:07 12:17 Treatment Length: 111 (minutes) Treatment Segments: 4 Vital Signs Capillary Blood Glucose Reference Range: 80 - 120 mg / dl HBO Diabetic Blood Glucose Intervention Range: <131 mg/dl or >324 mg/dl Type: Time Vitals Blood Respiratory Capillary Blood Glucose Pulse Action Pulse: Temperature: Taken: Pressure: Rate: Glucose (mg/dl): Meter #: Oximetry (%) Taken: Pre 10:25 118/80 111 18 97.9 145 99 Post 12:18 122/80 90 18 98 87 1 96 none per protocol Treatment Response Treatment Toleration: Well Treatment Completion Status: Treatment Completed without Adverse Event Electronic Signature(s) Signed: 01/16/2023 4:38:31 PM By: [name] PA-C Signed: 01/17/2023 3:52:57 PM By: Demetria Pore Entered By: Demetria Pore on 01/16/2023 12:30:58 -------------------------------------------------------------------------------- HBO Safety Checklist Details Patient Name: Date of Service: [name] Sells RNEY L. 01/16/2023 10:30 A M Medical Record Number: [id number] Patient Account Number: [id number] Date of Birth/Sex: Treating RN: 1961/02/04 (62 y.o. M) Primary Care Betta Balla: SYSTEM, PCP Other Clinician: Referring Aidenjames Heckmann: Treating Roshard Rezabek/Extender: Kaylyn Layer in Treatment: 11 HBO Safety Checklist Items Safety Checklist Consent Form Signed JONTAE, SCHAD ([id number]) [id number] Page 2 of 2 Patient voided / foley secured and emptied When did you last eato 01/16/23 Last dose of injectable or oral agent n/a Ostomy pouch emptied and vented if applicable NA All implantable devices assessed, documented and approved NA Intravenous access site secured and place NA Valuables secured Linens and cotton and cotton/polyester blend (less than 51% polyester) Personal oil-based products / skin lotions / body lotions removed Wigs or hairpieces removed NA Smoking or tobacco materials removed Books / newspapers / magazines / loose paper removed Cologne, aftershave, perfume and deodorant removed Jewelry removed (may wrap wedding band) Make-up removed NA Hair care products removed NA Battery operated devices (external) removed NA Heating patches and chemical warmers removed NA Titanium eyewear removed NA Nail polish cured greater than 10 hours NA Casting material cured greater than 10 hours NA Hearing aids removed NA Loose dentures or partials removed NA Prosthetics have been removed NA Patient demonstrates correct use of air break device (if applicable) Patient concerns have been addressed Patient grounding bracelet on and cord attached to chamber Specifics for Inpatients (complete in addition to above) Medication sheet sent with patient Intravenous  medications needed or due during therapy sent with patient Drainage tubes (e.g. nasogastric tube or chest tube secured and vented) Endotracheal or Tracheotomy tube secured Cuff deflated of air and inflated with saline Airway suctioned Electronic Signature(s) Signed: 01/17/2023 3:52:57 PM By: Demetria Pore Entered By: Lynnell Jude  Kim on 01/16/2023 12:29:02

## 2023-01-18 ENCOUNTER — Encounter: Payer: Medicare Other | Admitting: Physician Assistant

## 2023-01-18 DIAGNOSIS — E11621 Type 2 diabetes mellitus with foot ulcer: Secondary | ICD-10-CM | POA: Diagnosis not present

## 2023-01-18 LAB — GLUCOSE, CAPILLARY
Glucose-Capillary: 127 mg/dL — ABNORMAL HIGH (ref 70–99)
Glucose-Capillary: 126 mg/dL — ABNORMAL HIGH (ref 70–99)
Glucose-Capillary: 143 mg/dL — ABNORMAL HIGH (ref 70–99)
Glucose-Capillary: 122 mg/dL — ABNORMAL HIGH (ref 70–99)

## 2023-01-18 NOTE — Progress Notes (Signed)
[name] [name] ([id number]) [id number] Page 1 of 2 Visit Report for 01/15/2023 HBO Details Patient Name: Date of Service: [name] [name], [name] [name] 01/15/2023 10:30 A M Medical Record Number: [id number] Patient Account Number: [id number] Date of Birth/Sex: Treating RN: 1961-03-09 (62 y.o. M) Primary Care Chibueze Beasley: SYSTEM, PCP Other Clinician: Referring Mylen Mangan: Treating Sunday Klos/Extender: Kaylyn Layer in Treatment: 11 HBO Treatment Course Details Treatment Course Number: 1 Ordering Varnika Butz: [name] T Treatments Ordered: otal 40 HBO Treatment Start Date: 12/13/2022 HBO Indication: Chronic Refractory Osteomyelitis to left foot HBO Treatment Details Treatment Number: 15 Patient Type: Outpatient Chamber Type: Monoplace Chamber Serial #: [id number] Treatment Protocol: 2.0 ATA with 90 minutes oxygen, and no air breaks Treatment Details Compression Rate Down: 2.5 psi / minute De-Compression Rate Up: 1.5 psi / minute Air breaks and breathing Decompress Decompress Compress Tx Pressure Begins Reached periods Begins Ends (leave unused spaces blank) Chamber Pressure (ATA 1 2 ------2 1 ) Clock Time (24 hr) 10:54 11:05 - - - - - - 12:45 12:50 Treatment Length: 116 (minutes) Treatment Segments: 4 Vital Signs Capillary Blood Glucose Reference Range: 80 - 120 mg / dl HBO Diabetic Blood Glucose Intervention Range: <131 mg/dl or >324 mg/dl Type: Time Vitals Blood Pulse: Respiratory Temperature: Capillary Blood Glucose Pulse Action Taken: Pressure: Rate: Glucose (mg/dl): Meter #: Oximetry (%) Taken: Pre 10:16 124/80 104 18 98.1 115 1 99 2 ensures, crackers, and peanut butter Post 12:50 122/80 89 18 98 126 1 89 none per protocol Pre 153 1 none per protocol Treatment Response Treatment Toleration: Well Treatment Completion Status: Treatment Completed without Adverse Event Electronic Signature(s) Signed: 01/15/2023 3:52:57 PM By: Demetria Pore Signed: 01/17/2023 5:35:29 PM By: [name] PA-C Entered By: Demetria Pore on 01/15/2023 14:44:11 -------------------------------------------------------------------------------- HBO Safety Checklist Details Patient Name: Date of Service: [name] [name] RNEY L. 01/15/2023 10:30 A M Medical Record Number: [id number] Patient Account Number: [id number] Date of Birth/Sex: Treating RN: 01-10-1961 (62 y.o. M) Primary Care Elyjah Hazan: SYSTEM, PCP Other Clinician: Referring Anaelle Dunton: Treating Syndey Jaskolski/Extender: Kaylyn Layer in Treatment: 11 HBO Safety Checklist Items Safety Checklist CRISANTO, THERRIAULT L ([id number]) [id number] Page 2 of 2 Consent Form Signed Patient voided / foley secured and emptied When did you last eato 01/15/23 in clinic Last dose of injectable or oral agent n/a Ostomy pouch emptied and vented if applicable NA All implantable devices assessed, documented and approved NA Intravenous access site secured and place NA Valuables secured Linens and cotton and cotton/polyester blend (less than 51% polyester) Personal oil-based products / skin lotions / body lotions removed Wigs or hairpieces removed NA Smoking or tobacco materials removed Books / newspapers / magazines / loose paper removed Cologne, aftershave, perfume and deodorant removed Jewelry removed (may wrap wedding band) Make-up removed NA Hair care products removed Battery operated devices (external) removed NA Heating patches and chemical warmers removed NA Titanium eyewear removed NA Nail polish cured greater than 10 hours NA Casting material cured greater than 10 hours NA Hearing aids removed NA Loose dentures or partials removed NA Prosthetics have been removed NA Patient demonstrates correct use of air break device (if applicable) Patient concerns have been addressed Patient grounding bracelet on and cord attached to chamber Specifics for Inpatients  (complete in addition to above) Medication sheet sent with patient Intravenous medications needed or due during therapy sent with patient Drainage tubes (e.g. nasogastric tube or chest tube secured and vented) Endotracheal or Tracheotomy tube secured Cuff deflated of air and inflated with saline  Airway suctioned Electronic Signature(s) Signed: 01/15/2023 3:52:57 PM By: Demetria Pore Entered By: Demetria Pore on 01/15/2023 14:41:55

## 2023-01-18 NOTE — Progress Notes (Signed)
SKYLLER, CUSHING ([id number]) [id number] Page 1 of 2 Visit Report for 01/17/2023 HBO Details Patient Name: Date of Service: [name] [name], [name] [name] 01/17/2023 10:30 A M Medical Record Number: [id number] Patient Account Number: [id number] Date of Birth/Sex: Treating RN: [birth year] (62 y.o. M) Primary Care [name] [name]: SYSTEM, PCP Other Clinician: Referring [name] [name]: Treating [name] [name]/Extender: [name] [name] in Treatment: 11 HBO Treatment Course Details Treatment Course Number: 1 Ordering [name] [name]: [name] [name] Treatments Ordered: otal 40 HBO Treatment Start Date: 12/13/2022 HBO Indication: Chronic Refractory Osteomyelitis to left foot HBO Treatment Details Treatment Number: 17 Patient Type: Outpatient Chamber Type: Monoplace Chamber Serial #: [id number] Treatment Protocol: 2.0 ATA with 90 minutes oxygen, and no air breaks Treatment Details Compression Rate Down: 2.5 psi / minute De-Compression Rate Up: 1.5 psi / minute Air breaks and breathing Decompress Decompress Compress Tx Pressure Begins Reached periods Begins Ends (leave unused spaces blank) Chamber Pressure (ATA 1 2 ------2 1 ) Clock Time (24 hr) 11:13 11:23 - - - - - - 12:54 13:04 Treatment Length: 111 (minutes) Treatment Segments: 4 Vital Signs Capillary Blood Glucose Reference Range: 80 - 120 mg / dl HBO Diabetic Blood Glucose Intervention Range: <131 mg/dl or >638 mg/dl Type: Time Vitals Blood Pulse: Respiratory Temperature: Capillary Blood Glucose Pulse Action Taken: Pressure: Rate: Glucose (mg/dl): Meter #: Oximetry (%) Taken: Pre 10:50 120/80 101 18 97.9 117 2 ensures, crackers, and peanut butter Pre 11:12 149 1 none per protocol proceed per MD Treatment Response Treatment Toleration: Well Treatment Completion Status: Treatment Completed without Adverse Event HBO Attestation I certify that I supervised this HBO treatment in accordance with Medicare guidelines. A  trained emergency response team is readily available per Yes hospital policies and procedures. Continue HBOT as ordered. Yes Electronic Signature(s) Signed: 01/17/2023 4:06:15 PM By: [name] Corwin DO Entered By: [name] [name] on 01/17/2023 15:34:15 -------------------------------------------------------------------------------- HBO Safety Checklist Details Patient Name: Date of Service: [name] [name] [name] L. 01/17/2023 10:30 A M Medical Record Number: [id number] Patient Account Number: [id number] Date of Birth/Sex: Treating RN: [birth year] (62 y.o. M) Primary Care [name] [name]: SYSTEM, PCP Other Clinician: Referring [name] [name]: Treating [name] [name]/Extender: [name] [name] in Treatment: [name] [name] [id number] Page 2 of 2 HBO Safety Checklist Items Safety Checklist Consent Form Signed Patient voided / foley secured and emptied When did you last eato 01/17/23 in clinic Last dose of injectable or oral agent n/a Ostomy pouch emptied and vented if applicable NA All implantable devices assessed, documented and approved NA Intravenous access site secured and place NA Valuables secured Linens and cotton and cotton/polyester blend (less than 51% polyester) Personal oil-based products / skin lotions / body lotions removed Wigs or hairpieces removed NA Smoking or tobacco materials removed Books / newspapers / magazines / loose paper removed Cologne, aftershave, perfume and deodorant removed Jewelry removed (may wrap wedding band) Make-up removed NA Hair care products removed Battery operated devices (external) removed NA Heating patches and chemical warmers removed NA Titanium eyewear removed NA Nail polish cured greater than 10 hours NA Casting material cured greater than 10 hours NA Hearing aids removed NA Loose dentures or partials removed NA Prosthetics have been removed NA Patient demonstrates correct use of  air break device (if applicable) Patient concerns have been addressed Patient grounding bracelet on and cord attached to chamber Specifics for Inpatients (complete in addition to above) Medication sheet sent with patient Intravenous medications needed or due during therapy sent with patient Drainage tubes (e.g. nasogastric tube or chest  tube secured and vented) Endotracheal or Tracheotomy tube secured Cuff deflated of air and inflated with saline Airway suctioned Electronic Signature(s) Signed: 01/17/2023 3:52:57 PM By: Demetria Pore Entered By: Demetria Pore on 01/17/2023 14:19:53

## 2023-01-19 ENCOUNTER — Encounter: Payer: Medicare Other | Admitting: Physician Assistant

## 2023-01-19 ENCOUNTER — Encounter: Payer: Medicare Other | Admitting: Internal Medicine

## 2023-01-19 DIAGNOSIS — E11621 Type 2 diabetes mellitus with foot ulcer: Secondary | ICD-10-CM | POA: Diagnosis not present

## 2023-01-19 NOTE — Progress Notes (Signed)
ZAMEIR, WALTON ([id number]) [id number] Page 1 of 2 Visit Report for 01/18/2023 HBO Details Patient Name: Date of Service: [name] [name], [name] [name] 01/18/2023 10:30 A M Medical Record Number: [id number] Patient Account Number: [id number] Date of Birth/Sex: Treating RN: 03/21/1961 (62 y.o. M) Primary Care Zaveon Gillen: SYSTEM, PCP Other Clinician: Referring Jesiah Grismer: Treating Liliah Dorian/Extender: Kaylyn Layer in Treatment: 12 HBO Treatment Course Details Treatment Course Number: 1 Ordering Lutie Pickler: [name] T Treatments Ordered: otal 40 HBO Treatment Start Date: 12/13/2022 HBO Indication: Chronic Refractory Osteomyelitis to left foot HBO Treatment Details Treatment Number: 18 Patient Type: Outpatient Chamber Type: Monoplace Chamber Serial #: [id number] Treatment Protocol: 2.0 ATA with 90 minutes oxygen, and no air breaks Treatment Details Compression Rate Down: 2.0 psi / minute De-Compression Rate Up: 1.0 psi / minute Air breaks and breathing Decompress Decompress Compress Tx Pressure Begins Reached periods Begins Ends (leave unused spaces blank) Chamber Pressure (ATA 1 2 ------2 1 ) Clock Time (24 hr) 10:59 11:09 - - - - - - 12:45 12:55 Treatment Length: 116 (minutes) Treatment Segments: 4 Vital Signs Capillary Blood Glucose Reference Range: 80 - 120 mg / dl HBO Diabetic Blood Glucose Intervention Range: <131 mg/dl or >324 mg/dl Type: Time Vitals Blood Pulse: Respiratory Temperature: Capillary Blood Glucose Pulse Action Taken: Pressure: Rate: Glucose (mg/dl): Meter #: Oximetry (%) Taken: Pre 10:15 140/80 99 18 98 122 1 99 2 ensures, crackers, and peanut butter Post 12:55 124/80 80 18 98.1 127 1 99 none per protocol Pre 10:56 143 1 none per protocol proceed per MD Treatment Response Treatment Toleration: Well Treatment Completion Status: Treatment Completed without Adverse Event Electronic Signature(s) Signed: 01/18/2023 3:45:26 PM By:  Demetria Pore Signed: 01/18/2023 4:01:19 PM By: [name] PA-C Entered By: Demetria Pore on 01/18/2023 15:24:03 -------------------------------------------------------------------------------- HBO Safety Checklist Details Patient Name: Date of Service: [name] Sells RNEY [name] 01/18/2023 10:30 A M Medical Record Number: [id number] Patient Account Number: [id number] Date of Birth/Sex: Treating RN: [birth year] (62 y.o. M) Primary Care Marjon Doxtater: SYSTEM, PCP Other Clinician: Referring [place] Straus: Treating Waylon Koffler/Extender: Kaylyn Layer in Treatment: 12 HBO Safety Checklist Items Safety Checklist [name] [name], [name] [name] ([id number]) [id number] Page 2 of 2 Consent Form Signed Patient voided / foley secured and emptied When did you last eato in clinic 01/18/23 Last dose of injectable or oral agent n/a Ostomy pouch emptied and vented if applicable NA All implantable devices assessed, documented and approved NA Intravenous access site secured and place NA Valuables secured Linens and cotton and cotton/polyester blend (less than 51% polyester) Personal oil-based products / skin lotions / body lotions removed Wigs or hairpieces removed NA Smoking or tobacco materials removed NA Books / newspapers / magazines / loose paper removed Cologne, aftershave, perfume and deodorant removed Jewelry removed (may wrap wedding band) Make-up removed NA Hair care products removed NA Battery operated devices (external) removed NA Heating patches and chemical warmers removed NA Titanium eyewear removed NA Nail polish cured greater than 10 hours NA Casting material cured greater than 10 hours NA Hearing aids removed NA Loose dentures or partials removed NA Prosthetics have been removed NA Patient demonstrates correct use of air break device (if applicable) Patient concerns have been addressed Patient grounding bracelet on and cord attached to chamber Specifics for  Inpatients (complete in addition to above) Medication sheet sent with patient Intravenous medications needed or due during therapy sent with patient Drainage tubes (e.g. nasogastric tube or chest tube secured and vented) Endotracheal or Tracheotomy tube secured Cuff deflated  of air and inflated with saline Airway suctioned Electronic Signature(s) Signed: 01/18/2023 3:45:26 PM By: Demetria Pore Entered By: Demetria Pore on 01/18/2023 15:14:10

## 2023-01-23 ENCOUNTER — Encounter: Payer: Medicare Other | Admitting: Physician Assistant

## 2023-01-23 DIAGNOSIS — E11621 Type 2 diabetes mellitus with foot ulcer: Secondary | ICD-10-CM | POA: Diagnosis not present

## 2023-01-23 LAB — GLUCOSE, CAPILLARY
Glucose-Capillary: 91 mg/dL (ref 70–99)
Glucose-Capillary: 118 mg/dL — ABNORMAL HIGH (ref 70–99)

## 2023-01-24 ENCOUNTER — Encounter (HOSPITAL_BASED_OUTPATIENT_CLINIC_OR_DEPARTMENT_OTHER): Payer: Medicare Other | Admitting: Internal Medicine

## 2023-01-24 DIAGNOSIS — L97512 Non-pressure chronic ulcer of other part of right foot with fat layer exposed: Secondary | ICD-10-CM

## 2023-01-24 DIAGNOSIS — L97524 Non-pressure chronic ulcer of other part of left foot with necrosis of bone: Secondary | ICD-10-CM | POA: Diagnosis not present

## 2023-01-24 DIAGNOSIS — M86372 Chronic multifocal osteomyelitis, left ankle and foot: Secondary | ICD-10-CM

## 2023-01-24 DIAGNOSIS — E11621 Type 2 diabetes mellitus with foot ulcer: Secondary | ICD-10-CM | POA: Diagnosis not present

## 2023-01-24 LAB — GLUCOSE, CAPILLARY
Glucose-Capillary: 146 mg/dL — ABNORMAL HIGH (ref 70–99)
Glucose-Capillary: 71 mg/dL (ref 70–99)

## 2023-01-25 ENCOUNTER — Encounter: Payer: Medicare Other | Admitting: Physician Assistant

## 2023-01-25 DIAGNOSIS — E11621 Type 2 diabetes mellitus with foot ulcer: Secondary | ICD-10-CM | POA: Diagnosis not present

## 2023-01-25 LAB — GLUCOSE, CAPILLARY
Glucose-Capillary: 121 mg/dL — ABNORMAL HIGH (ref 70–99)
Glucose-Capillary: 156 mg/dL — ABNORMAL HIGH (ref 70–99)
Glucose-Capillary: 106 mg/dL — ABNORMAL HIGH (ref 70–99)

## 2023-01-25 NOTE — Progress Notes (Signed)
ROGEN, MESSMER ([id number]) [id number] Page 1 of 2 Visit Report for 01/24/2023 HBO Details Patient Name: Date of Service: [name] [name], [name] [name] 01/24/2023 10:30 A M Medical Record Number: [id number] Patient Account Number: [id number] Date of Birth/Sex: Treating RN: 1960/12/23 (62 y.o. M) Primary Care Francesco Provencal: SYSTEM, PCP Other Clinician: Referring Rhaya Coale: Treating Abria Vannostrand/Extender: Mallie Snooks in Treatment: 12 HBO Treatment Course Details Treatment Course Number: 1 Ordering Chantee Cerino: [name] T Treatments Ordered: otal 40 HBO Treatment Start Date: 12/13/2022 HBO Indication: Chronic Refractory Osteomyelitis to left foot HBO Treatment Details Treatment Number: 19 Patient Type: Outpatient Chamber Type: Monoplace Chamber Serial #: [id number] Treatment Protocol: 2.0 ATA with 90 minutes oxygen, and no air breaks Treatment Details Compression Rate Down: 2.0 psi / minute De-Compression Rate Up: 1.5 psi / minute Air breaks and breathing Decompress Decompress Compress Tx Pressure Begins Reached periods Begins Ends (leave unused spaces blank) Chamber Pressure (ATA 1 2 ------2 1 ) Clock Time (24 hr) 10:54 11:04 - - - - - - 12:35 12:45 Treatment Length: 111 (minutes) Treatment Segments: 4 Vital Signs Capillary Blood Glucose Reference Range: 80 - 120 mg / dl HBO Diabetic Blood Glucose Intervention Range: <131 mg/dl or >034 mg/dl Type: Time Vitals Blood Respiratory Capillary Blood Glucose Pulse Action Pulse: Temperature: Taken: Pressure: Rate: Glucose (mg/dl): Meter #: Oximetry (%) Taken: Pre 10:35 122/78 108 18 98 146 1 99 none per protocol Post 12:46 118/80 98 18 97.9 71 1 99 none per protocol Treatment Response Treatment Toleration: Well Treatment Completion Status: Treatment Completed without Adverse Event HBO Attestation I certify that I supervised this HBO treatment in accordance with Medicare guidelines. A trained  emergency response team is readily available per Yes hospital policies and procedures. Continue HBOT as ordered. Yes Electronic Signature(s) Signed: 01/24/2023 4:40:33 PM By: Geralyn Corwin DO Previous Signature: 01/24/2023 4:00:03 PM Version By: Demetria Pore Entered By: Geralyn Corwin on 01/24/2023 16:39:26 -------------------------------------------------------------------------------- HBO Safety Checklist Details Patient Name: Date of Service: [name] Sells RNEY L. 01/24/2023 10:30 A M Medical Record Number: [id number] Patient Account Number: [id number] Date of Birth/Sex: Treating RN: 1961-01-20 (62 y.o. M) Primary Care Maudell Stanbrough: SYSTEM, PCP Other Clinician: Referring Rakeisha Nyce: Treating Dev Dhondt/Extender: Mitzi Hansen Jefferson, Guss Bunde ([id number]) [id number] Page 2 of 2 Weeks in Treatment: 12 HBO Safety Checklist Items Safety Checklist Consent Form Signed Patient voided / foley secured and emptied When did you last eato 01/24/23 am Last dose of injectable or oral agent n/a Ostomy pouch emptied and vented if applicable NA All implantable devices assessed, documented and approved NA Intravenous access site secured and place NA Valuables secured Linens and cotton and cotton/polyester blend (less than 51% polyester) Personal oil-based products / skin lotions / body lotions removed Wigs or hairpieces removed NA Smoking or tobacco materials removed Books / newspapers / magazines / loose paper removed Cologne, aftershave, perfume and deodorant removed Jewelry removed (may wrap wedding band) Make-up removed NA Hair care products removed Battery operated devices (external) removed NA Heating patches and chemical warmers removed NA Titanium eyewear removed NA Nail polish cured greater than 10 hours NA Casting material cured greater than 10 hours NA Hearing aids removed NA Loose dentures or partials removed NA Prosthetics have  been removed NA Patient demonstrates correct use of air break device (if applicable) Patient concerns have been addressed Patient grounding bracelet on and cord attached to chamber Specifics for Inpatients (complete in addition to above) Medication sheet sent with patient Intravenous medications needed or due during therapy sent  with patient Drainage tubes (e.g. nasogastric tube or chest tube secured and vented) Endotracheal or Tracheotomy tube secured Cuff deflated of air and inflated with saline Airway suctioned Electronic Signature(s) Signed: 01/24/2023 4:00:03 PM By: Demetria Pore Entered By: Demetria Pore on 01/24/2023 14:20:35

## 2023-01-26 ENCOUNTER — Encounter: Payer: Medicare Other | Admitting: Physician Assistant

## 2023-01-26 NOTE — Progress Notes (Signed)
ILYAAS, KNACK ([id number]) [id number] Page 1 of 2 Visit Report for 01/25/2023 HBO Details Patient Name: Date of Service: [name] [name], [name] [name] 01/25/2023 10:30 A M Medical Record Number: [id number] Patient Account Number: [id number] Date of Birth/Sex: Treating RN: 1961-02-27 (62 y.o. M) Primary Care Phil Corti: SYSTEM, PCP Other Clinician: Referring Edrick Whitehorn: Treating Shyonna Carlin/Extender: Kaylyn Layer in Treatment: 13 HBO Treatment Course Details Treatment Course Number: 1 Ordering Rynlee Lisbon: [name] T Treatments Ordered: otal 40 HBO Treatment Start Date: 12/13/2022 HBO Indication: Chronic Refractory Osteomyelitis to left foot HBO Treatment Details Treatment Number: 20 Patient Type: Outpatient Chamber Type: Monoplace Chamber Serial #: [id number] Treatment Protocol: 2.0 ATA with 90 minutes oxygen, and no air breaks Treatment Details Compression Rate Down: 2.0 psi / minute De-Compression Rate Up: 1.5 psi / minute Air breaks and breathing Decompress Decompress Compress Tx Pressure Begins Reached periods Begins Ends (leave unused spaces blank) Chamber Pressure (ATA 1 2 ------2 1 ) Clock Time (24 hr) 10:54 11:06 - - - - - - 12:36 12:46 Treatment Length: 112 (minutes) Treatment Segments: 4 Vital Signs Capillary Blood Glucose Reference Range: 80 - 120 mg / dl HBO Diabetic Blood Glucose Intervention Range: <131 mg/dl or >010 mg/dl Type: Time Vitals Blood Pulse: Respiratory Temperature: Capillary Blood Glucose Pulse Action Taken: Pressure: Rate: Glucose (mg/dl): Meter #: Oximetry (%) Taken: Pre 10:00 118/80 100 18 98 121 1 98 2 ensures, crackers, and peanut butter Post 12:46 122/80 86 18 97.8 106 1 98 none per protocol Treatment Response Treatment Toleration: Well Treatment Completion Status: Treatment Completed without Adverse Event Electronic Signature(s) Signed: 01/25/2023 4:17:20 PM By: Demetria Pore Signed: 01/25/2023 6:06:31 PM By:  [name] PA-C Entered By: Demetria Pore on 01/25/2023 15:30:46 -------------------------------------------------------------------------------- HBO Safety Checklist Details Patient Name: Date of Service: [name] Sells RNEY L. 01/25/2023 10:30 A M Medical Record Number: [id number] Patient Account Number: [id number] Date of Birth/Sex: Treating RN: 05-07-1961 (62 y.o. M) Primary Care Joshva Labreck: SYSTEM, PCP Other Clinician: Referring Treyden Hakim: Treating Dashiell Franchino/Extender: Kaylyn Layer in Treatment: 13 HBO Safety Checklist Items Safety Checklist Consent Form Signed [name] [name], [name] [name] ([id number]) [id number] Page 2 of 2 Patient voided / foley secured and emptied When did you last eato 01/25/23 in clinic Last dose of injectable or oral agent n/a Ostomy pouch emptied and vented if applicable NA All implantable devices assessed, documented and approved NA Intravenous access site secured and place NA Valuables secured Linens and cotton and cotton/polyester blend (less than 51% polyester) Personal oil-based products / skin lotions / body lotions removed Wigs or hairpieces removed NA Smoking or tobacco materials removed Books / newspapers / magazines / loose paper removed Cologne, aftershave, perfume and deodorant removed Jewelry removed (may wrap wedding band) NA Make-up removed NA Hair care products removed NA Battery operated devices (external) removed NA Heating patches and chemical warmers removed NA Titanium eyewear removed NA Nail polish cured greater than 10 hours NA Casting material cured greater than 10 hours NA Hearing aids removed NA Loose dentures or partials removed NA Prosthetics have been removed NA Patient demonstrates correct use of air break device (if applicable) Patient concerns have been addressed Patient grounding bracelet on and cord attached to chamber Specifics for Inpatients (complete in addition to  above) Medication sheet sent with patient Intravenous medications needed or due during therapy sent with patient Drainage tubes (e.g. nasogastric tube or chest tube secured and vented) Endotracheal or Tracheotomy tube secured Cuff deflated of air and inflated with saline Airway suctioned Electronic Signature(s)  Signed: 01/25/2023 4:17:20 PM By: Demetria Pore Entered By: Demetria Pore on 01/25/2023 15:28:01

## 2023-01-29 ENCOUNTER — Encounter: Payer: Medicare Other | Attending: Physician Assistant | Admitting: Physician Assistant

## 2023-01-29 ENCOUNTER — Encounter: Payer: Medicare Other | Attending: Internal Medicine | Admitting: Physician Assistant

## 2023-01-29 DIAGNOSIS — L97512 Non-pressure chronic ulcer of other part of right foot with fat layer exposed: Secondary | ICD-10-CM | POA: Insufficient documentation

## 2023-01-29 DIAGNOSIS — M14672 Charcot's joint, left ankle and foot: Secondary | ICD-10-CM | POA: Insufficient documentation

## 2023-01-29 DIAGNOSIS — I87333 Chronic venous hypertension (idiopathic) with ulcer and inflammation of bilateral lower extremity: Secondary | ICD-10-CM | POA: Insufficient documentation

## 2023-01-29 DIAGNOSIS — M86372 Chronic multifocal osteomyelitis, left ankle and foot: Secondary | ICD-10-CM | POA: Diagnosis not present

## 2023-01-29 DIAGNOSIS — E11610 Type 2 diabetes mellitus with diabetic neuropathic arthropathy: Secondary | ICD-10-CM | POA: Insufficient documentation

## 2023-01-29 DIAGNOSIS — Z89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: Secondary | ICD-10-CM | POA: Insufficient documentation

## 2023-01-29 DIAGNOSIS — Z89421 Acquired absence of other right toe(s): Secondary | ICD-10-CM | POA: Diagnosis not present

## 2023-01-29 DIAGNOSIS — F17218 Nicotine dependence, cigarettes, with other nicotine-induced disorders: Secondary | ICD-10-CM | POA: Diagnosis not present

## 2023-01-29 DIAGNOSIS — E11621 Type 2 diabetes mellitus with foot ulcer: Secondary | ICD-10-CM | POA: Insufficient documentation

## 2023-01-29 DIAGNOSIS — E1169 Type 2 diabetes mellitus with other specified complication: Secondary | ICD-10-CM | POA: Diagnosis not present

## 2023-01-29 DIAGNOSIS — L97524 Non-pressure chronic ulcer of other part of left foot with necrosis of bone: Secondary | ICD-10-CM | POA: Insufficient documentation

## 2023-01-29 DIAGNOSIS — I87323 Chronic venous hypertension (idiopathic) with inflammation of bilateral lower extremity: Secondary | ICD-10-CM | POA: Insufficient documentation

## 2023-01-29 LAB — GLUCOSE, CAPILLARY
Glucose-Capillary: 112 mg/dL — ABNORMAL HIGH (ref 70–99)
Glucose-Capillary: 123 mg/dL — ABNORMAL HIGH (ref 70–99)
Glucose-Capillary: 82 mg/dL (ref 70–99)

## 2023-01-29 NOTE — Progress Notes (Signed)
MYKOL, GOYTIA ([id number]) [id number] Page 1 of 2 Visit Report for 01/29/2023 HBO Details Patient Name: Date of Service: [name] [name], [name] [name] 01/29/2023 10:30 A M Medical Record Number: [id number] Patient Account Number: [id number] Date of Birth/Sex: Treating RN: Sep 12, 1960 (62 y.o. [name] Primary Care Allena Pietila: SYSTEM, PCP Other Clinician: Referring Dillin Lofgren: Treating Loanne Emery/Extender: Kaylyn Layer in Treatment: 13 HBO Treatment Course Details Treatment Course Number: 1 Ordering Nicki Furlan: [name] T Treatments Ordered: otal 40 HBO Treatment Start Date: 12/13/2022 HBO Indication: Chronic Refractory Osteomyelitis to left foot HBO Treatment Details Chamber Type: Monoplace Patient Type: Outpatient Chamber Serial #: [id number] Treatment Protocol: 2.0 ATA with 90 minutes oxygen, and no air breaks Treatment Details Compression Rate Down: 1.5 psi / minute De-Compression Rate Up: 1.5 psi / minute Air breaks and breathing Decompress Decompress Compress Tx Pressure Begins Reached periods Begins Ends (leave unused spaces blank) Chamber Pressure (ATA 1 2 ------2 1 ) Clock Time (24 hr) 11:20 - ------- - Treatment Length: (minutes) Treatment Segments: Vital Signs Capillary Blood Glucose Reference Range: 80 - 120 mg / dl HBO Diabetic Blood Glucose Intervention Range: <131 mg/dl or >403 mg/dl Type: Time Vitals Blood Pulse: Respiratory Temperature: Capillary Blood Glucose Pulse Action Taken: Pressure: Rate: Glucose (mg/dl): Meter #: Oximetry (%) Taken: Pre 10:36 128/62 88 16 97.9 112 Ensure given to patient, recheck in 30 minutes Pre 128 per protocol, treatment initiated Electronic Signature(s) Signed: 01/29/2023 11:29:44 AM By: Elliot Gurney, [name], RN, CWS, [name], [name] Previous Signature: 01/29/2023 11:25:53 AM Version By: Elliot Gurney, [name], RN, CWS, [name], [name] Entered By: Elliot Gurney, [name], RN, CWS, Kim on 01/29/2023  11:29:44 -------------------------------------------------------------------------------- HBO Safety Checklist Details Patient Name: Date of Service: [name] [name], [name] RNEY L. 01/29/2023 10:30 A M Medical Record Number: [id number] Patient Account Number: [id number] Date of Birth/Sex: Treating RN: Jul 18, 1961 (62 y.o. [name] Primary Care Katriona Schmierer: SYSTEM, PCP Other Clinician: Referring Marabelle Cushman: Treating Tishina Lown/Extender: Kaylyn Layer in Treatment: 13 HBO Safety Checklist Items Safety Checklist Consent Form Signed Patient voided / foley secured and emptied When did you last eato pm Last dose of injectable or oral agent pm Ostomy pouch emptied and vented if applicable NA [name] [name], [name] L ([id number]) [id number] Page 2 of 2 All implantable devices assessed, documented and approved NA Intravenous access site secured and place NA Valuables secured Linens and cotton and cotton/polyester blend (less than 51% polyester) Personal oil-based products / skin lotions / body lotions removed NA Wigs or hairpieces removed NA Smoking or tobacco materials removed Books / newspapers / magazines / loose paper removed Cologne, aftershave, perfume and deodorant removed Jewelry removed (may wrap wedding band) Make-up removed NA Hair care products removed Battery operated devices (external) removed NA Heating patches and chemical warmers removed NA Titanium eyewear removed NA Nail polish cured greater than 10 hours NA Casting material cured greater than 10 hours NA Hearing aids removed NA Loose dentures or partials removed Prosthetics have been removed NA Patient demonstrates correct use of air break device (if applicable) Patient concerns have been addressed Patient grounding bracelet on and cord attached to chamber Specifics for Inpatients (complete in addition to above) Medication sheet sent with patient Intravenous medications needed or  due during therapy sent with patient Drainage tubes (e.g. nasogastric tube or chest tube secured and vented) Endotracheal or Tracheotomy tube secured Cuff deflated of air and inflated with saline Airway suctioned Electronic Signature(s) Signed: 01/29/2023 11:24:13 AM By: Elliot Gurney, [name], RN, CWS, [name], [name] Entered By: Elliot Gurney, [name],  RN, Garnette Gunner on 01/29/2023 11:24:13

## 2023-01-30 ENCOUNTER — Encounter: Payer: Medicare Other | Admitting: Physician Assistant

## 2023-01-30 DIAGNOSIS — E11621 Type 2 diabetes mellitus with foot ulcer: Secondary | ICD-10-CM | POA: Diagnosis not present

## 2023-01-30 LAB — GLUCOSE, CAPILLARY
Glucose-Capillary: 149 mg/dL — ABNORMAL HIGH (ref 70–99)
Glucose-Capillary: 97 mg/dL (ref 70–99)

## 2023-01-30 NOTE — Progress Notes (Signed)
VIN, GOERLITZ ([id number]) [id number] Page 1 of 2 Visit Report for 01/30/2023 HBO Details Patient Name: Date of Service: [name] [name], [name] [name] 01/30/2023 10:30 A M Medical Record Number: [id number] Patient Account Number: [id number] Date of Birth/Sex: Treating [name]: 05-28-1961 (62 y.o. [name] [name] Primary Care [name] [name]: SYSTEM, PCP Other Clinician: Referring [name] [name]: Treating [name] [name]/Extender: [name] [name] in Treatment: 13 HBO Treatment Course Details Treatment Course Number: 1 Ordering Corisa Montini: [name] T Treatments Ordered: otal 40 HBO Treatment Start Date: 12/13/2022 HBO Indication: Chronic Refractory Osteomyelitis to left foot HBO Treatment Details Treatment Number: 22 Patient Type: Outpatient Chamber Type: Monoplace Chamber Serial #: [id number] Treatment Protocol: 2.0 ATA with 90 minutes oxygen, and no air breaks Treatment Details Compression Rate Down: 1.5 psi / minute De-Compression Rate Up: 1.5 psi / minute Air breaks and breathing Decompress Decompress Compress Tx Pressure Begins Reached periods Begins Ends (leave unused spaces blank) Chamber Pressure (ATA 1 2 ------2 1 ) Clock Time (24 hr) 10:34 10:45 - - - - - - 12:16 12:26 Treatment Length: 112 (minutes) Treatment Segments: 4 Vital Signs Capillary Blood Glucose Reference Range: 80 - 120 mg / dl HBO Diabetic Blood Glucose Intervention Range: <131 mg/dl or >324 mg/dl Type: Time Vitals Blood Respiratory Capillary Blood Glucose Pulse Action Pulse: Temperature: Taken: Pressure: Rate: Glucose (mg/dl): Meter #: Oximetry (%) Taken: Pre 10:49 130/80 84 16 97.8 149 treatment initiated Post 12:30 132/88 80 16 97.9 97 discharged home Treatment Response Treatment Toleration: Well Treatment Completion Status: Treatment Completed without Adverse Event [name] [name] Notes Patient refused Ensure today stating it upsets his stomach. Patient feels fina and is going straight to  lunch following his treatment today. Electronic Signature(s) Signed: 01/30/2023 2:02:01 PM By: Elliot Gurney, [name], [name], [name] [name], [name] [name], [name] [name] Signed: 01/30/2023 5:37:24 PM By: [name] PA-C Previous Signature: 01/30/2023 10:51:07 AM Version By: Elliot Gurney, [name], [name], [name] [name], [name] [name], [name] [name] Entered By: Elliot Gurney, [name], [name], [name] [name], [name] on 01/30/2023 14:02:01 -------------------------------------------------------------------------------- HBO Safety Checklist Details Patient Name: Date of Service: [name] [name], [name] RNEY L. 01/30/2023 10:30 A M Medical Record Number: [id number] Patient Account Number: [id number] Date of Birth/Sex: Treating [name]: [birth year] (62 y.o. [name] [name] Primary Care [name] [name]: SYSTEM, PCP Other Clinician: Referring Jep Dyas: Treating [name] [name]/Extender: [name] [name] in Treatment: [address], [place] ([id number]) [id number] Page 2 of 2 HBO Safety Checklist Items Safety Checklist Consent Form Signed Patient voided / foley secured and emptied When did you last eato pm Last dose of injectable or oral agent am Ostomy pouch emptied and vented if applicable NA All implantable devices assessed, documented and approved NA Intravenous access site secured and place NA Valuables secured Linens and cotton and cotton/polyester blend (less than 51% polyester) Personal oil-based products / skin lotions / body lotions removed Wigs or hairpieces removed NA Smoking or tobacco materials removed NA Books / newspapers / magazines / loose paper removed Cologne, aftershave, perfume and deodorant removed Jewelry removed (may wrap wedding band) NA Make-up removed NA Hair care products removed Battery operated devices (external) removed NA Heating patches and chemical warmers removed NA Titanium eyewear removed NA Nail polish cured greater than 10 hours NA Casting material cured greater than 10 hours NA Hearing aids removed NA Loose dentures or partials  removed NA Prosthetics have been removed NA Patient demonstrates correct use of air break device (if applicable) Patient concerns have been addressed Patient grounding bracelet on and cord attached to chamber Specifics for Inpatients (complete in addition to above) Medication sheet sent with  patient Intravenous medications needed or due during therapy sent with patient Drainage tubes (e.g. nasogastric tube or chest tube secured and vented) Endotracheal or Tracheotomy tube secured Cuff deflated of air and inflated with saline Airway suctioned Electronic Signature(s) Signed: 01/30/2023 10:50:35 AM By: Elliot Gurney, [name], [name], [name] [name], [name] [name], [name] [name] Entered By: Elliot Gurney, [name], [name], [name] [name], [name] on 01/30/2023 10:50:34

## 2023-01-31 ENCOUNTER — Encounter: Payer: Medicare Other | Admitting: Internal Medicine

## 2023-01-31 DIAGNOSIS — F17218 Nicotine dependence, cigarettes, with other nicotine-induced disorders: Secondary | ICD-10-CM | POA: Diagnosis not present

## 2023-01-31 DIAGNOSIS — M14672 Charcot's joint, left ankle and foot: Secondary | ICD-10-CM | POA: Diagnosis not present

## 2023-01-31 DIAGNOSIS — M86372 Chronic multifocal osteomyelitis, left ankle and foot: Secondary | ICD-10-CM | POA: Diagnosis not present

## 2023-01-31 DIAGNOSIS — L97512 Non-pressure chronic ulcer of other part of right foot with fat layer exposed: Secondary | ICD-10-CM | POA: Diagnosis not present

## 2023-01-31 DIAGNOSIS — I87323 Chronic venous hypertension (idiopathic) with inflammation of bilateral lower extremity: Secondary | ICD-10-CM | POA: Diagnosis not present

## 2023-01-31 DIAGNOSIS — E11621 Type 2 diabetes mellitus with foot ulcer: Secondary | ICD-10-CM | POA: Diagnosis present

## 2023-01-31 DIAGNOSIS — Z89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: Secondary | ICD-10-CM | POA: Diagnosis not present

## 2023-01-31 DIAGNOSIS — L97524 Non-pressure chronic ulcer of other part of left foot with necrosis of bone: Secondary | ICD-10-CM | POA: Diagnosis not present

## 2023-01-31 LAB — GLUCOSE, CAPILLARY
Glucose-Capillary: 144 mg/dL — ABNORMAL HIGH (ref 70–99)
Glucose-Capillary: 106 mg/dL — ABNORMAL HIGH (ref 70–99)

## 2023-01-31 NOTE — Progress Notes (Signed)
SHION, STIFEL ([id number]) [id number] Page 1 of 2 Visit Report for 01/31/2023 HBO Details Patient Name: Date of Service: [name] [name], [name] [name] 01/31/2023 10:30 A M Medical Record Number: [id number] Patient Account Number: [id number] Date of Birth/Sex: Treating RN: Jan 31, 1961 (62 y.o. [name] Primary Care Presleigh Feldstein: SYSTEM, PCP Other Clinician: Referring Hondo Nanda: Treating Brier Reid/Extender: RO BSO N, MICHA EL Jennette Bill in Treatment: 13 HBO Treatment Course Details Treatment Course Number: 1 Ordering Quindarius Cabello: [name] T Treatments Ordered: otal 40 HBO Treatment Start Date: 12/13/2022 HBO Indication: Chronic Refractory Osteomyelitis to left foot HBO Treatment Details Treatment Number: 23 Patient Type: Outpatient Chamber Type: Monoplace Chamber Serial #: [id number] Treatment Protocol: 2.0 ATA with 90 minutes oxygen, and no air breaks Treatment Details Compression Rate Down: 1.5 psi / minute De-Compression Rate Up: 1.5 psi / minute Air breaks and breathing Decompress Decompress Compress Tx Pressure Begins Reached periods Begins Ends (leave unused spaces blank) Chamber Pressure (ATA 1 2 ------2 1 ) Clock Time (24 hr) 10:35 10:47 - - - - - - 12:17 12:29 Treatment Length: 114 (minutes) Treatment Segments: 4 Vital Signs Capillary Blood Glucose Reference Range: 80 - 120 mg / dl HBO Diabetic Blood Glucose Intervention Range: <131 mg/dl or >725 mg/dl Type: Time Vitals Blood Respiratory Capillary Blood Glucose Pulse Action Pulse: Temperature: Taken: Pressure: Rate: Glucose (mg/dl): Meter #: Oximetry (%) Taken: Pre 10:35 118/68 72 16 98.4 144 treatment initiated Post 12:33 138/72 68 16 98.3 106 discharged to home Treatment Response Treatment Toleration: Well Treatment Completion Status: Treatment Completed without Adverse Event Atul Delucia Notes No concerns with treatment given HBO Attestation I certify that I supervised this HBO  treatment in accordance with Medicare guidelines. A trained emergency response team is readily available per Yes hospital policies and procedures. Continue HBOT as ordered. Yes Electronic Signature(s) Unsigned Previous Signature: 01/31/2023 1:29:03 PM Version By: Elliot Gurney, [name], RN, CWS, [name], [name] Previous Signature: 01/31/2023 12:19:15 PM Version By: Elliot Gurney, [name], RN, CWS, [name], [name] Previous Signature: 01/31/2023 11:01:43 AM Version By: Elliot Gurney, [name], RN, CWS, [name], [name] Entered By: Baltazar Najjar on 01/31/2023 15:10:16 [name] ([id number]) [id number] Page 2 of 2 -------------------------------------------------------------------------------- HBO Safety Checklist Details Patient Name: Date of Service: [name] [name] 01/31/2023 10:30 A M Medical Record Number: [id number] Patient Account Number: [id number] Date of Birth/Sex: Treating RN: 06/13/1961 (62 y.o. [name] Primary Care Azaliyah Kennard: SYSTEM, PCP Other Clinician: Referring Isidore Margraf: Treating Jameir Ake/Extender: RO BSO N, MICHA EL Lynelle Smoke Weeks in Treatment: 13 HBO Safety Checklist Items Safety Checklist Consent Form Signed Patient voided / foley secured and emptied When did you last eato am Last dose of injectable or oral agent pm Ostomy pouch emptied and vented if applicable NA All implantable devices assessed, documented and approved NA Intravenous access site secured and place NA Valuables secured Linens and cotton and cotton/polyester blend (less than 51% polyester) Personal oil-based products / skin lotions / body lotions removed Wigs or hairpieces removed NA Smoking or tobacco materials removed NA Books / newspapers / magazines / loose paper removed NA Cologne, aftershave, perfume and deodorant removed Jewelry removed (may wrap wedding band) NA Make-up removed NA Hair care products removed Battery operated devices (external) removed NA Heating patches and chemical  warmers removed NA Titanium eyewear removed NA Nail polish cured greater than 10 hours NA Casting material cured greater than 10 hours NA Hearing aids removed NA Loose dentures or partials removed NA Prosthetics have been removed Patient demonstrates correct use  of air break device (if applicable) Patient concerns have been addressed Patient grounding bracelet on and cord attached to chamber Specifics for Inpatients (complete in addition to above) Medication sheet sent with patient Intravenous medications needed or due during therapy sent with patient Drainage tubes (e.g. nasogastric tube or chest tube secured and vented) Endotracheal or Tracheotomy tube secured Cuff deflated of air and inflated with saline Airway suctioned Electronic Signature(s) Signed: 01/31/2023 11:01:03 AM By: Elliot Gurney, [name], RN, CWS, [name], [name] Entered By: Elliot Gurney, [name], RN, CWS, Kim on 01/31/2023 11:01:03

## 2023-02-01 ENCOUNTER — Encounter: Payer: Medicare Other | Admitting: Physician Assistant

## 2023-02-01 DIAGNOSIS — E11621 Type 2 diabetes mellitus with foot ulcer: Secondary | ICD-10-CM | POA: Diagnosis not present

## 2023-02-01 LAB — GLUCOSE, CAPILLARY
Glucose-Capillary: 161 mg/dL — ABNORMAL HIGH (ref 70–99)
Glucose-Capillary: 89 mg/dL (ref 70–99)

## 2023-02-01 NOTE — Progress Notes (Addendum)
RHAMEL, BOULEY ([id number]) [id number] Page 1 of 2 Visit Report for 02/01/2023 HBO Details Patient Name: Date of Service: [name] [name], [name] [name] 02/01/2023 10:30 A M Medical Record Number: [id number] Patient Account Number: [id number] Date of Birth/Sex: Treating RN: [birth year] (62 y.o. 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 HBO Treatment Details Treatment Number: 24 Patient Type: Outpatient Chamber Type: Monoplace Chamber Serial #: [id number] Treatment Protocol: 2.0 ATA with 90 minutes oxygen, and no air breaks Treatment Details Compression Rate Down: 1.5 psi / minute De-Compression Rate Up: 1.5 psi / minute Air breaks and breathing Decompress Decompress Compress Tx Pressure Begins Reached periods Begins Ends (leave unused spaces blank) Chamber Pressure (ATA 1 2 ------2 1 ) Clock Time (24 hr) 10:39 10:51 - - - - - - 12:21 12:31 Treatment Length: 112 (minutes) Treatment Segments: 4 Vital Signs Capillary Blood Glucose Reference Range: 80 - 120 mg / dl HBO Diabetic Blood Glucose Intervention Range: <131 mg/dl or >664 mg/dl Type: Time Vitals Blood Respiratory Capillary Blood Glucose Pulse Action Pulse: Temperature: Taken: Pressure: Rate: Glucose (mg/dl): Meter #: Oximetry (%) Taken: Pre 10:27 140/60 78 16 98.2 161 treatment initiated Post 12:44 142/68 75 16 98 89 discharged home Treatment Response Treatment Toleration: Well Treatment Completion Status: Treatment Completed without Adverse Event Shriyan Arakawa Notes Patient BG was 89 when coming out of the chamber. Patient did not feel bad. Patient was offered, Ensure,  juice, crackers and peanut butter. He refused them all stating he was fine and was going straight to eat. PA notified.patient dc'd, asymptomatic. Electronic Signature(s) Signed: 02/02/2023 9:22:23 AM By: Elliot Gurney, [name], RN, CWS, [name], [name] Signed: 02/02/2023 12:57:26 PM By: [name] PA-C Previous Signature: 02/02/2023 9:22:10 AM Version By: Elliot Gurney [name], RN, CWS, [name], [name] Previous Signature: 02/01/2023 12:48:06 PM Version By: Elliot Gurney, [name], RN, CWS, [name], [name] Previous Signature: 02/01/2023 4:39:56 PM Version By: [name] PA-C Previous Signature: 02/01/2023 12:22:39 PM Version By: Elliot Gurney [name], RN, CWS, [name], [name] Previous Signature: 02/01/2023 10:40:11 AM Version By: Elliot Gurney, [name], RN, CWS, [name], [name] Entered By: Elliot Gurney, [name], RN, CWS, Kim on 02/02/2023 40:34:74 -------------------------------------------------------------------------------- HBO Safety Checklist Details Patient Name: Date of Service: [name] [name], [name] [name] RNEY L. 02/01/2023 10:30 A M Medical Record Number: [id number] Patient Account Number: [id number] Date of Birth/Sex: Treating RN: [birth year] (62 y.o. [name] Primary Care Taiden Raybourn: SYSTEM, PCP Other Clinician: ASHYR, CUEN ([id number]) [id number] Page 2 of 2 Referring Randi Poullard: Treating Asmar Brozek/Extender: Kaylyn Layer in Treatment: 14 HBO Safety Checklist Items Safety Checklist Consent Form Signed Patient voided / foley secured and emptied When did you last eato pm Last dose of injectable or oral agent Ostomy pouch emptied and vented if applicable NA All implantable devices assessed, documented and approved NA Intravenous access site secured and place NA Valuables secured Linens and cotton and cotton/polyester blend (less than 51% polyester) Personal oil-based products / skin lotions / body lotions removed Wigs or hairpieces removed NA Smoking or tobacco materials removed NA Books / newspapers / magazines / loose paper  removed NA Cologne, aftershave, perfume and deodorant removed Jewelry removed (may wrap wedding band) NA Make-up removed NA Hair care products removed NA Battery operated devices (external) removed NA Heating patches and chemical warmers  removed NA Titanium eyewear removed NA Nail polish cured greater than 10 hours NA Casting material cured greater than 10 hours NA Hearing aids removed NA Loose dentures or partials removed NA Prosthetics have been removed NA Patient demonstrates correct use of air break device (if applicable) Patient concerns have been addressed Patient grounding bracelet on and cord attached to chamber Specifics for Inpatients (complete in addition to above) Medication sheet sent with patient Intravenous medications needed or due during therapy sent with patient Drainage tubes (e.g. nasogastric tube or chest tube secured and vented) Endotracheal or Tracheotomy tube secured Cuff deflated of air and inflated with saline Airway suctioned Electronic Signature(s) Signed: 02/01/2023 10:36:13 AM By: Elliot Gurney, [name], RN, CWS, [name], [name] Entered By: Elliot Gurney, [name], RN, CWS, Kim on 02/01/2023 10:36:13

## 2023-02-02 NOTE — Progress Notes (Signed)
BECKETT, MADEN ([id number]) [id number] Page 1 of 2 Visit Report for 02/01/2023 Problem List Details Patient Name: Date of Service: [name] [name], [name] [name] 02/01/2023 10:30 A M Medical Record Number: [id number] Patient Account Number: [id number] Date of Birth/Sex: Treating RN: [birth year] (62 y.o. [name], [name] Primary Care Provider: SYSTEM, PCP Other Clinician: Referring Provider: Treating Provider/Extender: Kaylyn Layer in Treatment: 14 Active Problems ICD-10 Encounter Code Description Active Date MDM Diagnosis [phone] Chronic multifocal osteomyelitis, left ankle and foot 12/07/2022 No Yes E11.621 Type 2 diabetes mellitus with foot ulcer 10/26/2022 No Yes L97.524 Non-pressure chronic ulcer of other part of left foot with necrosis of bone 10/26/2022 No Yes L97.512 Non-pressure chronic ulcer of other part of right foot with fat layer exposed 10/26/2022 No Yes M14.672 Charcot's joint, left ankle and foot 10/26/2022 No Yes 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 10/26/2022 No Yes I87.323 Chronic venous hypertension (idiopathic) with inflammation of bilateral lower 10/26/2022 No Yes extremity F17.218 Nicotine dependence, cigarettes, with other nicotine-induced disorders 10/26/2022 No Yes Inactive Problems Resolved Problems Electronic Signature(s) Signed: 02/02/2023 9:22:49 AM By: Elliot Gurney, [name], RN, CWS, [name], [name] Signed: 02/02/2023 12:57:26 PM By: [name] PA-C Entered By: Elliot Gurney, [name], RN, CWS, Kim on 02/02/2023 09:22:49 -------------------------------------------------------------------------------- SuperBill Details Patient Name: Date of Service: [name] 02/01/2023 Medical Record Number: [id number] Patient Account Number: [id number] Date of Birth/Sex: Treating RN: [birth year] (62 y.o. 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 M14.672 Charcot's joint, left ankle and foot 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 I87.323 Chronic venous hypertension (idiopathic) with inflammation of bilateral lower extremity F17.218 Nicotine dependence, cigarettes, with other nicotine-induced disorders Facility Procedures : CPT4 Code: 29518841 Description: (Facility Use Only) HBOT full body chamber, , Modifier: Quantity: 4 Physician Procedures : CPT4: Description Modifier Code [id number] Physician attendance and supervision of hyperbaric oxygen therapy, per sessionsupervision of hyperbaric Oxygen therapy, per session ICD-10 Diagnosis Description M86.372 Chronic multifocal osteomyelitis, left  ankle and foot E11.621 Type 2 diabetes mellitus with foot ulcer L97.524 Non-pressure chronic ulcer of other part of left foot with necrosis of bone Quantity: 1 Electronic Signature(s) Signed: 02/02/2023 9:22:35 AM By: Elliot Gurney, [name], RN, CWS, [name], [name] Signed: 02/02/2023 12:57:26 PM By: [name] PA-C Entered By: Elliot Gurney, [name], RN, CWS, Kim on 02/02/2023 09:22:35

## 2023-02-05 ENCOUNTER — Encounter: Payer: Medicare Other | Admitting: Physician Assistant

## 2023-02-05 DIAGNOSIS — E11621 Type 2 diabetes mellitus with foot ulcer: Secondary | ICD-10-CM | POA: Diagnosis not present

## 2023-02-05 LAB — GLUCOSE, CAPILLARY
Glucose-Capillary: 144 mg/dL — ABNORMAL HIGH (ref 70–99)
Glucose-Capillary: 106 mg/dL — ABNORMAL HIGH (ref 70–99)

## 2023-02-05 NOTE — Progress Notes (Signed)
LERRY, COUNSELMAN ([id number]) [id number] Page 1 of 2 Visit Report for 02/05/2023 HBO Details Patient Name: Date of Service: [name] [name], [name] [name] 02/05/2023 10:30 A M Medical Record Number: [id number] Patient Account Number: [id number] Date of Birth/Sex: Treating RN: 12-11-1960 (62 y.o. 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 HBO Treatment Details Treatment Number: 25 Patient Type: Outpatient Chamber Type: Monoplace Chamber Serial #: [id number] Treatment Protocol: 2.0 ATA with 90 minutes oxygen, and no air breaks Treatment Details Compression Rate Down: 1.5 psi / minute De-Compression Rate Up: 1.5 psi / minute Air breaks and breathing Decompress Decompress Compress Tx Pressure Begins Reached periods Begins Ends (leave unused spaces blank) Chamber Pressure (ATA 1 2 ------2 1 ) Clock Time (24 hr) 10:43 10:55 - - - - - - 12:11 12:22 Treatment Length: 99 (minutes) Treatment Segments: 3 Vital Signs Capillary Blood Glucose Reference Range: 80 - 120 mg / dl HBO Diabetic Blood Glucose Intervention Range: <131 mg/dl or >272 mg/dl Type: Time Vitals Blood Respiratory Capillary Blood Glucose Pulse Action Pulse: Temperature: Taken: Pressure: Rate: Glucose (mg/dl): Meter #: Oximetry (%) Taken: Pre 10:33 148/70 80 16 98 144 treatment initiated Post 12:34 148/78 78 16 97.7 106 discharged home Treatment Response Treatment Toleration: Well Treatment Completion Status: Treatment Completed without Adverse Event Treatment Notes Patient states his stomach is bubbling up and he needs to get out of the chamber. Began de-compression at  12:11pm. Psychologist, prison and probation services) Signed: 02/05/2023 3:21:49 PM By: Elliot Gurney, [name], RN, CWS, [name], [name] Signed: 02/06/2023 6:26:43 PM By: [name] Derry PA-C Previous Signature: 02/05/2023 12:22:12 PM Version By: Elliot Gurney [name], RN, CWS, [name], [name] Previous Signature: 02/05/2023 12:14:38 PM Version By: Elliot Gurney, [name], RN, CWS, [name], [name] Previous Signature: 02/05/2023 11:06:46 AM Version By: Elliot Gurney, [name], RN, CWS, [name], [name] Previous Signature: 02/05/2023 11:06:31 AM Version By: Elliot Gurney, [name], RN, CWS, [name], [name] Previous Signature: 02/05/2023 10:44:19 AM Version By: Elliot Gurney, [name], RN, CWS, [name], [name] Entered By: Elliot Gurney, [name], RN, CWS, Kim on 02/05/2023 15:21:49 -------------------------------------------------------------------------------- HBO Safety Checklist Details Patient Name: Date of Service: [name], [name] 02/05/2023 10:30 A M Medical Record Number: [id number] Patient Account Number: [id number] Date of Birth/Sex: Treating RN: September 27, 1960 (62 y.o. [name] [name] Primary Care [name] [name]: SYSTEM, PCP Other Clinician: Referring [name] [name]: Treating [name] [name]/Extender: [name] [name] [name] [name] ([id number]) [id number] Page 2 of 2 Weeks in Treatment: 14 HBO Safety Checklist Items Safety Checklist Consent Form Signed Patient voided / foley secured and emptied When did you last eato pm Last dose of injectable or oral agent pm Ostomy pouch emptied and vented if applicable NA All implantable devices assessed, documented and approved NA Intravenous access site secured and place NA Valuables secured Linens and cotton and cotton/polyester blend (less than 51% polyester) Personal oil-based products / skin lotions / body lotions removed Wigs or hairpieces removed NA Smoking or tobacco materials removed NA Books / newspapers / magazines / loose paper removed Cologne, aftershave, perfume and deodorant removed Jewelry removed (may wrap wedding band) NA Make-up  removed NA Hair care products removed Battery operated devices (external) removed NA Heating patches and chemical warmers removed NA Titanium eyewear removed NA Nail polish cured greater than 10 hours NA Casting material cured greater than  10 hours NA Hearing aids removed NA Loose dentures or partials removed NA Prosthetics have been removed NA Patient demonstrates correct use of air break device (if applicable) Patient concerns have been addressed Patient grounding bracelet on and cord attached to chamber Specifics for Inpatients (complete in addition to above) Medication sheet sent with patient Intravenous medications needed or due during therapy sent with patient Drainage tubes (e.g. nasogastric tube or chest tube secured and vented) Endotracheal or Tracheotomy tube secured Cuff deflated of air and inflated with saline Airway suctioned Electronic Signature(s) Signed: 02/05/2023 10:38:54 AM By: Elliot Gurney, [name], RN, CWS, [name], [name] Entered By: Elliot Gurney, [name], RN, CWS, Kim on 02/05/2023 10:38:54

## 2023-02-06 ENCOUNTER — Encounter: Payer: Medicare Other | Admitting: Physician Assistant

## 2023-02-07 ENCOUNTER — Encounter (HOSPITAL_BASED_OUTPATIENT_CLINIC_OR_DEPARTMENT_OTHER): Payer: Medicare Other | Admitting: Internal Medicine

## 2023-02-07 DIAGNOSIS — M86372 Chronic multifocal osteomyelitis, left ankle and foot: Secondary | ICD-10-CM | POA: Diagnosis not present

## 2023-02-07 DIAGNOSIS — L97512 Non-pressure chronic ulcer of other part of right foot with fat layer exposed: Secondary | ICD-10-CM | POA: Diagnosis not present

## 2023-02-07 DIAGNOSIS — L97524 Non-pressure chronic ulcer of other part of left foot with necrosis of bone: Secondary | ICD-10-CM

## 2023-02-07 DIAGNOSIS — E11621 Type 2 diabetes mellitus with foot ulcer: Secondary | ICD-10-CM | POA: Diagnosis not present

## 2023-02-07 LAB — GLUCOSE, CAPILLARY
Glucose-Capillary: 129 mg/dL — ABNORMAL HIGH (ref 70–99)
Glucose-Capillary: 158 mg/dL — ABNORMAL HIGH (ref 70–99)
Glucose-Capillary: 100 mg/dL — ABNORMAL HIGH (ref 70–99)

## 2023-02-08 ENCOUNTER — Encounter: Payer: Medicare Other | Admitting: Physician Assistant

## 2023-02-08 DIAGNOSIS — E11621 Type 2 diabetes mellitus with foot ulcer: Secondary | ICD-10-CM | POA: Diagnosis not present

## 2023-02-08 LAB — GLUCOSE, CAPILLARY
Glucose-Capillary: 101 mg/dL — ABNORMAL HIGH (ref 70–99)
Glucose-Capillary: 118 mg/dL — ABNORMAL HIGH (ref 70–99)
Glucose-Capillary: 122 mg/dL — ABNORMAL HIGH (ref 70–99)
Glucose-Capillary: 131 mg/dL — ABNORMAL HIGH (ref 70–99)
Glucose-Capillary: 94 mg/dL (ref 70–99)

## 2023-02-08 NOTE — Progress Notes (Signed)
[name], [name] ([id number]) [id number] Page 1 of 2 Visit Report for 02/07/2023 Arrival Information Details Patient Name: Date of Service: [name] [name], [name] [name] 02/07/2023 10:30 A M Medical Record Number: [id number] Patient Account Number: [id number] Date of Birth/Sex: Treating RN: [birth year] (62 y.o. M) Primary Care Dent Plantz: SYSTEM, PCP Other Clinician: Referring Artemis Loyal: Treating Rubylee Zamarripa/Extender: Mallie Snooks in Treatment: 14 Visit Information History Since Last Visit Added or deleted any medications: No Patient Arrived: Ambulatory Any new allergies or adverse reactions: No Arrival Time: 09:50 Had a fall or experienced change in No Accompanied By: self activities of daily living that may affect Transfer Assistance: None risk of falls: Patient Identification Verified: Yes Signs or symptoms of abuse/neglect since last visito No Secondary Verification Process Completed: Yes Hospitalized since last visit: No Patient Requires Transmission-Based Precautions: No Implantable device outside of the clinic excluding No Patient Has Alerts: Yes cellular tissue based products placed in the center Patient Alerts: Diabetes Type 2 since last visit: R ABI 1.03 [name] Pain Present Now: No Electronic Signature(s) Signed: 02/07/2023 3:55:23 PM By: Demetria Pore Entered By: Demetria Pore on 02/07/2023 14:28:59 -------------------------------------------------------------------------------- Encounter Discharge Information Details Patient Name: Date of Service: [name] [name] 02/07/2023 10:30 A M Medical Record Number: [id number] Patient Account Number: [id number] Date of Birth/Sex: Treating RN: 02/17/1961 (62 y.o. M) Primary Care Rolly Magri: SYSTEM, PCP Other Clinician: Referring Evelina Lore: Treating Natara Monfort/Extender: Mallie Snooks in Treatment: 14 Encounter Discharge Information Items Discharge Condition:  Stable Ambulatory Status: Ambulatory Discharge Destination: Home Transportation: Private Auto Accompanied By: self Schedule Follow-up Appointment: Yes Clinical Summary of Care: Electronic Signature(s) Signed: 02/07/2023 3:55:23 PM By: Demetria Pore Entered By: Demetria Pore on 02/07/2023 14:33:57 -------------------------------------------------------------------------------- Vitals Details Patient Name: Date of Service: [name] [name] 02/07/2023 10:30 A M Medical Record Number: [id number] Patient Account Number: [id number] Date of Birth/Sex: Treating RN: [birth year] (62 y.o. M) Primary Care Giulio Bertino: SYSTEM, PCP Other Clinician: Referring Anquanette Bahner: Treating Jossette Zirbel/Extender: Mallie Snooks in Treatment: 14 Vital Signs Time Taken: 09:58 Temperature (F): 97.9 Height (in): 76 Pulse (bpm): 105 [name] [name], [name] [name] ([id number]) [id number] Page 2 of 2 Weight (lbs): 232 Respiratory Rate (breaths/min): 18 Body Mass Index (BMI): 28.2 Blood Pressure (mmHg): 118/80 Capillary Blood Glucose (mg/dl): 253 Reference Range: 80 - 120 mg / dl Airway Pulse Oximetry (%): 99 Electronic Signature(s) Signed: 02/07/2023 3:55:23 PM By: Demetria Pore Entered By: Demetria Pore on 02/07/2023 14:29:01

## 2023-02-08 NOTE — Progress Notes (Signed)
[name] [name] ([id number]) [id number] Page 1 of 2 Visit Report for 02/07/2023 HBO Details Patient Name: Date of Service: [name] [name], [name] [name] 02/07/2023 10:30 A M Medical Record Number: [id number] Patient Account Number: [id number] Date of Birth/Sex: Treating RN: Apr 08, 1961 (62 y.o. 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 HBO Treatment Details Treatment Number: 26 Patient Type: Outpatient Chamber Type: Monoplace Chamber Serial #: [id number] Treatment Protocol: 2.0 ATA with 90 minutes oxygen, and no air breaks Treatment Details Compression Rate Down: 2.0 psi / minute De-Compression Rate Up: 1.5 psi / minute Air breaks and breathing Decompress Decompress Compress Tx Pressure Begins Reached periods Begins Ends (leave unused spaces blank) Chamber Pressure (ATA 1 2 ------2 1 ) Clock Time (24 hr) 10:26 10:37 - - - - - - 12:07 12:17 Treatment Length: 111 (minutes) Treatment Segments: 4 Vital Signs Capillary Blood Glucose Reference Range: 80 - 120 mg / dl HBO Diabetic Blood Glucose Intervention Range: <131 mg/dl or >564 mg/dl Type: Time Vitals Blood Pulse: Respiratory Temperature: Capillary Blood Glucose Pulse Action Taken: Pressure: Rate: Glucose (mg/dl): Meter #: Oximetry (%) Taken: Pre 09:58 118/80 105 18 97.9 129 1 99 2 ensures, crackers, and peanut butter Pre 10:25 153 1 none per protocol Post 12:17 124/80 100 18 98 100 1 none per protocol Treatment Response Treatment Toleration: Well Treatment Completion Status: Treatment Completed without Adverse Event HBO Attestation I certify that I supervised this HBO treatment in  accordance with Medicare guidelines. A trained emergency response team is readily available per Yes hospital policies and procedures. Continue HBOT as ordered. Yes Electronic Signature(s) Signed: 02/07/2023 4:14:13 PM By: Geralyn Corwin DO Entered By: Geralyn Corwin on 02/07/2023 15:22:20 -------------------------------------------------------------------------------- HBO Safety Checklist Details Patient Name: Date of Service: [name] [name] RNEY L. 02/07/2023 10:30 A M Medical Record Number: [id number] Patient Account Number: [id number] Date of Birth/Sex: Treating RN: 06-20-1961 (62 y.o. M) Primary Care Kristan Brummitt: SYSTEM, PCP Other Clinician: Referring Kaity Pitstick: Treating Roisin Mones/Extender: Mitzi Hansen Cornish, Guss Bunde ([id number]) [id number] Page 2 of 2 Weeks in Treatment: 14 HBO Safety Checklist Items Safety Checklist Consent Form Signed Patient voided / foley secured and emptied When did you last eato 02/07/23 am in clinic Last dose of injectable or oral agent n/a Ostomy pouch emptied and vented if applicable NA All implantable devices assessed, documented and approved NA Intravenous access site secured and place NA Valuables secured Linens and cotton and cotton/polyester blend (less than 51% polyester) Personal oil-based products / skin lotions / body lotions removed Wigs or hairpieces removed NA Smoking or tobacco materials removed Books / newspapers / magazines / loose paper removed Cologne, aftershave, perfume and deodorant removed Jewelry removed (may wrap wedding band) Make-up removed NA Hair care products removed Battery operated devices (external) removed NA Heating patches and chemical warmers removed NA Titanium eyewear removed NA Nail polish cured greater than 10 hours NA Casting material cured greater than 10 hours NA Hearing aids removed NA Loose dentures or partials removed NA Prosthetics have been  removed NA Patient demonstrates correct use of air break device (if applicable) Patient concerns have been addressed Patient grounding bracelet on and cord attached to chamber Specifics for Inpatients (complete in addition to above) Medication sheet sent with patient Intravenous medications needed or due during  therapy sent with patient Drainage tubes (e.g. nasogastric tube or chest tube secured and vented) Endotracheal or Tracheotomy tube secured Cuff deflated of air and inflated with saline Airway suctioned Electronic Signature(s) Signed: 02/07/2023 3:55:23 PM By: Demetria Pore Entered By: Demetria Pore on 02/07/2023 14:29:05

## 2023-02-10 NOTE — Progress Notes (Signed)
HABEN, BILLINGSLEY ([id number]) [id number] Page 1 of 2 Visit Report for 02/08/2023 Arrival Information Details Patient Name: Date of Service: [name] [name], [name] [name] 02/08/2023 10:30 A M Medical Record Number: [id number] Patient Account Number: [id number] Date of Birth/Sex: Treating RN: April 29, 1961 (62 y.o. M) Primary Care Gevorg Brum: SYSTEM, PCP Other Clinician: Referring Merlyn Bollen: Treating Gresia Isidoro/Extender: Kaylyn Layer in Treatment: 15 Visit Information History Since Last Visit Added or deleted any medications: No Patient Arrived: Ambulatory Any new allergies or adverse reactions: No Arrival Time: 10:30 Had a fall or experienced change in No Accompanied By: self activities of daily living that may affect Transfer Assistance: None risk of falls: Patient Requires Transmission-Based Precautions: No Signs or symptoms of abuse/neglect since last visito No Patient Has Alerts: Yes Hospitalized since last visit: No Patient Alerts: Diabetes Type 2 Implantable device outside of the clinic excluding No R ABI 1.03 L 1.10 cellular tissue based products placed in the center since last visit: Pain Present Now: No Electronic Signature(s) Signed: 02/08/2023 3:43:39 PM By: Demetria Pore Entered By: Demetria Pore on 02/08/2023 14:31:44 -------------------------------------------------------------------------------- Encounter Discharge Information Details Patient Name: Date of Service: [name] Sells RNEY L. 02/08/2023 10:30 A M Medical Record Number: [id number] Patient Account Number: [id number] Date of Birth/Sex: Treating RN: 22-Nov-1960 (62 y.o. M) Primary Care Janan Bogie: SYSTEM, PCP Other Clinician: Referring Masiyah Engen: Treating Silas Muff/Extender: Kaylyn Layer in Treatment: 15 Encounter Discharge Information Items Discharge Condition: Stable Ambulatory Status: Ambulatory Discharge Destination: Home Transportation: Private  Auto Accompanied By: self Schedule Follow-up Appointment: Yes Clinical Summary of Care: [name] [name], [name] [name] ([id number]) [id number] Page 2 of 2 Electronic Signature(s) Signed: 02/08/2023 3:43:39 PM By: Demetria Pore Entered By: Demetria Pore on 02/08/2023 14:34:21 -------------------------------------------------------------------------------- Vitals Details Patient Name: Date of Service: [name] Sells RNEY L. 02/08/2023 10:30 A M Medical Record Number: [id number] Patient Account Number: [id number] Date of Birth/Sex: Treating RN: 1961-05-30 (62 y.o. M) Primary Care Khriz Liddy: SYSTEM, PCP Other Clinician: Referring Nychelle Cassata: Treating Chin Wachter/Extender: Kaylyn Layer in Treatment: 15 Vital Signs Time Taken: 10:33 Temperature (F): 97.9 Height (in): 76 Pulse (bpm): 100 Weight (lbs): 232 Respiratory Rate (breaths/min): 18 Body Mass Index (BMI): 28.2 Blood Pressure (mmHg): 124/80 Capillary Blood Glucose (mg/dl): 536 Reference Range: 80 - 120 mg / dl Airway Pulse Oximetry (%): 97 Electronic Signature(s) Signed: 02/08/2023 3:43:39 PM By: Demetria Pore Entered By: Demetria Pore on 02/08/2023 14:31:47

## 2023-02-10 NOTE — Progress Notes (Signed)
[name] [name], [name] [name] ([id number]) [id number] Page 1 of 2 Visit Report for 02/08/2023 HBO Details Patient Name: Date of Service: [name] [name], [name] [name] 02/08/2023 10:30 A M Medical Record Number: [id number] Patient Account Number: [id number] Date of Birth/Sex: Treating RN: 1960/11/22 (62 y.o. 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 HBO Treatment Details Treatment Number: 27 Patient Type: Outpatient Chamber Type: Monoplace Chamber Serial #: [id number] Treatment Protocol: 2.0 ATA with 90 minutes oxygen, and no air breaks Treatment Details Compression Rate Down: 2.0 psi / minute De-Compression Rate Up: 1.5 psi / minute Air breaks and breathing Decompress Decompress Compress Tx Pressure Begins Reached periods Begins Ends (leave unused spaces blank) Chamber Pressure (ATA 1 2 ------2 1 ) Clock Time (24 hr) 11:42 11:52 - - - - - - 13:23 13:33 Treatment Length: 111 (minutes) Treatment Segments: 4 Vital Signs Capillary Blood Glucose Reference Range: 80 - 120 mg / dl HBO Diabetic Blood Glucose Intervention Range: <131 mg/dl or >324 mg/dl Type: Time Vitals Blood Pulse: Respiratory Temperature: Capillary Blood Glucose Pulse Action Taken: Pressure: Rate: Glucose (mg/dl): Meter #: Oximetry (%) Taken: Pre 10:33 124/80 100 18 97.9 118 1 97 Pre 11:03 122 1 crackers apple juice given per Sherrill Buikema Pre 131 1 Proceed per Kennadie Brenner Post 13:33 126/80 98 18 98 94 1 98 none per protocol Treatment Response Treatment Toleration: Well Treatment Completion Status: Treatment Completed without Adverse Event Electronic Signature(s) Signed: 02/08/2023 3:43:39 PM By:  Demetria Pore Signed: 02/08/2023 5:43:48 PM By: [name] PA-C Entered By: Demetria Pore on 02/08/2023 14:33:45 [name] ([id number]) [id number] Page 2 of 2 -------------------------------------------------------------------------------- HBO Safety Checklist Details Patient Name: Date of Service: [name] [name], [name] [name] 02/08/2023 10:30 A M Medical Record Number: [id number] Patient Account Number: [id number] Date of Birth/Sex: Treating RN: 12-Oct-1960 (62 y.o. M) Primary Care Jamylah Marinaccio: SYSTEM, PCP Other Clinician: Referring Pranav Lince: Treating Patsye Sullivant/Extender: Kaylyn Layer in Treatment: 15 HBO Safety Checklist Items Safety Checklist Consent Form Signed Patient voided / foley secured and emptied When did you last eato 02/08/23 in clinic Last dose of injectable or oral agent n/a Ostomy pouch emptied and vented if applicable NA All implantable devices assessed, documented and approved NA Intravenous access site secured and place NA Valuables secured Linens and cotton and cotton/polyester blend (less than 51% polyester) Personal oil-based products / skin lotions / body lotions removed Wigs or hairpieces removed NA Smoking or tobacco materials removed Books / newspapers / magazines / loose paper removed Cologne, aftershave, perfume and deodorant removed Jewelry removed (may wrap wedding band) Make-up removed NA Hair care products removed Battery operated devices (external) removed NA Heating patches and chemical warmers removed NA Titanium eyewear removed NA Nail polish cured greater than 10 hours NA Casting material cured greater than 10 hours NA Hearing aids removed NA Loose dentures or partials removed NA Prosthetics have been removed NA Patient demonstrates correct use of air break device (if applicable) Patient concerns have been addressed Patient grounding bracelet on and cord attached to chamber Specifics for  Inpatients (complete in addition to above) Medication sheet sent with patient Intravenous medications needed or due during therapy sent with patient Drainage tubes (e.g. nasogastric tube or chest tube secured and vented) Endotracheal or Tracheotomy tube secured Cuff deflated of air  and inflated with saline Airway suctioned Electronic Signature(s) Signed: 02/08/2023 3:43:39 PM By: Demetria Pore Entered By: Demetria Pore on 02/08/2023 14:31:51

## 2023-02-10 NOTE — Progress Notes (Signed)
[name] [name] ([id number]) [id number] Page 1 of 9 Visit Report for 02/08/2023 Arrival Information Details Patient Name: Date of Service: [name] [name], [name] [name] [place] RNEY [name] 02/08/2023 9:45 A M Medical Record Number: [id number] Patient Account Number: [id number] Date of Birth/Sex: Treating RN: [birth year] (62 y.o. [name]) Yevonne Pax Primary Care Yezenia Fredrick: SYSTEM, PCP Other Clinician: Referring Jayd Forrey: Treating Annita Ratliff/Extender: Kaylyn Layer in Treatment: 15 Visit Information History Since Last Visit Added or deleted any medications: No Patient Arrived: Ambulatory Any new allergies or adverse reactions: No Arrival Time: 10:16 Had a fall or experienced change in No Accompanied By: self activities of daily living that may affect Transfer Assistance: None risk of falls: Patient Identification Verified: Yes Signs or symptoms of abuse/neglect since last visito No Secondary Verification Process Completed: Yes Hospitalized since last visit: No Patient Requires Transmission-Based Precautions: No Implantable device outside of the clinic excluding No Patient Has Alerts: Yes cellular tissue based products placed in the center Patient Alerts: Diabetes Type 2 since last visit: R ABI 1.03 [name] Has Dressing in Place as Prescribed: Yes Pain Present Now: No Electronic Signature(s) Signed: 02/08/2023 4:13:25 PM By: Yevonne Pax RN Entered By: Yevonne Pax on 02/08/2023 10:16:22 -------------------------------------------------------------------------------- Clinic Level of Care Assessment Details Patient Name: Date of Service: [name] [name] 02/08/2023 9:45 A M Medical Record Number: [id number] Patient Account Number: [id number] Date of Birth/Sex: Treating RN: 08/14/1961 (62 y.o. [name] [name] Primary Care Soffia Doshier: SYSTEM, PCP Other Clinician: Referring Tagan Bartram: Treating Bonnie Roig/Extender: Kaylyn Layer in Treatment: 15 Clinic  Level of Care Assessment Items TOOL 1 Quantity Score []  - 0 Use when EandM and Procedure is performed on INITIAL visit ASSESSMENTS - Nursing Assessment / Reassessment []  - 0 General Physical Exam (combine w/ comprehensive assessment (listed just below) when performed on new pt. evals) []  - 0 Comprehensive Assessment (HX, ROS, Risk Assessments, Wounds Hx, etc.) [name] [name] ([id number]) [id number] Page 2 of 9 ASSESSMENTS - Wound and Skin Assessment / Reassessment []  - 0 Dermatologic / Skin Assessment (not related to wound area) ASSESSMENTS - Ostomy and/or Continence Assessment and Care []  - 0 Incontinence Assessment and Management []  - 0 Ostomy Care Assessment and Management (repouching, etc.) PROCESS - Coordination of Care []  - 0 Simple Patient / Family Education for ongoing care []  - 0 Complex (extensive) Patient / Family Education for ongoing care []  - 0 Staff obtains Chiropractor, Records, T Results / Process Orders est []  - 0 Staff telephones HHA, Nursing Homes / Clarify orders / etc []  - 0 Routine Transfer to another Facility (non-emergent condition) []  - 0 Routine Hospital Admission (non-emergent condition) []  - 0 New Admissions / Manufacturing engineer / Ordering NPWT Apligraf, etc. , []  - 0 Emergency Hospital Admission (emergent condition) PROCESS - Special Needs []  - 0 Pediatric / Minor Patient Management []  - 0 Isolation Patient Management []  - 0 Hearing / Language / Visual special needs []  - 0 Assessment of Community assistance (transportation, D/C planning, etc.) []  - 0 Additional assistance / Altered mentation []  - 0 Support Surface(s) Assessment (bed, cushion, seat, etc.) INTERVENTIONS - Miscellaneous []  - 0 External ear exam []  - 0 Patient Transfer (multiple staff / Nurse, adult / Similar devices) []  - 0 Simple Staple / Suture removal (25 or less) []  - 0 Complex Staple / Suture removal (26 or more) []  -  0 Hypo/Hyperglycemic Management (do not check if billed separately) []  - 0 Ankle / Brachial Index (ABI) - do not check if billed separately  Has the patient been seen at the hospital within the last three years: Yes Total Score: 0 Level Of Care: ____ Electronic Signature(s) Signed: 02/08/2023 4:30:05 PM By: Angelina Pih Entered By: Angelina Pih on 02/08/2023 10:49:25 -------------------------------------------------------------------------------- Encounter Discharge Information Details Patient Name: Date of Service: [name] [name] RNEY [name] 02/08/2023 9:45 A M Medical Record Number: [id number] Patient Account Number: [id number] Date of Birth/Sex: Treating RN: 1961/06/03 (62 y.o. [name] [name] Primary Care Addison Freimuth: SYSTEM, PCP Other Clinician: Referring Dailyn Reith: Treating Sumayah Bearse/Extender: Kaylyn Layer in Treatment: [address] [place] [name] [id number] Page 3 of 9 Encounter Discharge Information Items Post Procedure Vitals Discharge Condition: Stable Temperature (F): 98 Ambulatory Status: Ambulatory Pulse (bpm): 103 Discharge Destination: Home Respiratory Rate (breaths/min): 18 Transportation: Private Auto Blood Pressure (mmHg): 97/64 Accompanied By: self Schedule Follow-up Appointment: Yes Clinical Summary of Care: Electronic Signature(s) Signed: 02/08/2023 10:50:42 AM By: Angelina Pih Entered By: Angelina Pih on 02/08/2023 10:50:41 -------------------------------------------------------------------------------- Lower Extremity Assessment Details Patient Name: Date of Service: [name] [name] RNEY [name] 02/08/2023 9:45 A M Medical Record Number: [id number] Patient Account Number: [id number] Date of Birth/Sex: Treating RN: October 04, 1960 (62 y.o. [name]) Yevonne Pax Primary Care Zyshonne Malecha: SYSTEM, PCP Other Clinician: Referring Mersedes Alber: Treating Tyson Masin/Extender: Maryellen Pile Weeks in Treatment: 15 Edema  Assessment Assessed: [Left: No] [Right: No] Edema: [Left: Ye] [Right: s] Electronic Signature(s) Signed: 02/08/2023 4:13:25 PM By: Yevonne Pax RN Entered By: Yevonne Pax on 02/08/2023 10:17:01 -------------------------------------------------------------------------------- Multi Wound Chart Details Patient Name: Date of Service: [name] [name] RNEY [name] 02/08/2023 9:45 A M Medical Record Number: [id number] Patient Account Number: [id number] Date of Birth/Sex: Treating RN: 1961-06-14 (62 y.o. [name] [name] Primary Care Frances Joynt: SYSTEM, PCP Other Clinician: Referring Zayah Keilman: Treating Nasha Diss/Extender: Kaylyn Layer in Treatment: 15 Vital Signs Height(in): 76 Pulse(bpm): 103 Weight(lbs): 232 Blood Pressure(mmHg): 97/64 Body Mass Index(BMI): 28.2 Temperature(F): 98 Boroff, [name] [name] ([id number]) [id number] Page 4 of 9 Respiratory Rate(breaths/min): 18 [1:Photos: No Photos Left, Anterior T Second oe Wound Location: Pressure Injury Wounding Event: Diabetic Wound/Ulcer of the Lower Primary Etiology: Extremity Peripheral Venous Disease, Type II Comorbid History: Diabetes, Osteomyelitis 10/12/2022 Date Acquired: 15  Weeks of Treatment: Open Wound Status: No Wound Recurrence: Yes Pending A mputation on Presentation: 0.2x0.2x0.1 Measurements [name] x W x D (cm) 0.031 A (cm) : rea 0.003 Volume (cm) : 96.70% % Reduction in A rea: 99.40% % Reduction in Volume: Grade 3  Classification: Medium Exudate A mount: Serosanguineous Exudate Type: red, brown Exudate Color: Large (67-100%) Granulation A mount: Red, Pink Granulation Quality: None Present (0%) Necrotic A mount: Fat Layer (Subcutaneous Tissue): Yes N/A Exposed  Structures: Fascia: No Tendon: No Muscle: No Joint: No Bone: No Large (67-100%) Epithelialization:] [N/A:N/A N/A N/A N/A N/A N/A N/A N/A N/A N/A N/A N/A N/A N/A N/A N/A N/A N/A N/A N/A N/A N/A N/A] Treatment Notes Electronic Signature(s) Signed:  02/08/2023 4:13:25 PM By: Yevonne Pax RN Entered By: Yevonne Pax on 02/08/2023 10:21:54 -------------------------------------------------------------------------------- Multi-Disciplinary Care Plan Details Patient Name: Date of Service: [name] [name] RNEY [name] 02/08/2023 9:45 A M Medical Record Number: [id number] Patient Account Number: [id number] Date of Birth/Sex: Treating RN: 1961-03-04 (62 y.o. [name] [name] Primary Care Odai Wimmer: SYSTEM, PCP Other Clinician: Referring Kimori Tartaglia: Treating Coltrane Tugwell/Extender: Kaylyn Layer in Treatment: 15 Active Inactive Necrotic Tissue Nursing Diagnoses: Impaired tissue integrity related to necrotic/devitalized tissue Knowledge deficit related to management of necrotic/devitalized tissue Goals: ZYMARI, BAYER ([id number]) [id number] Page 5 of 9 Necrotic/devitalized tissue will be minimized in  the wound bed Date Initiated: 10/26/2022 Target Resolution Date: 02/23/2023 Goal Status: Active Patient/caregiver will verbalize understanding of reason and process for debridement of necrotic tissue Date Initiated: 10/26/2022 Date Inactivated: 11/24/2022 Target Resolution Date: 11/24/2022 Goal Status: Met Interventions: Assess patient pain level pre-, during and post procedure and prior to discharge Provide education on necrotic tissue and debridement process Treatment Activities: Excisional debridement : 10/26/2022 T ordered outside of clinic : 10/26/2022 est Notes: Wound/Skin Impairment Nursing Diagnoses: Impaired tissue integrity Knowledge deficit related to smoking impact on wound healing Knowledge deficit related to ulceration/compromised skin integrity Goals: Patient will demonstrate a reduced rate of smoking or cessation of smoking Date Initiated: 10/26/2022 Date Inactivated: 11/24/2022 Target Resolution Date: 11/24/2022 Goal Status: Met Patient will have a decrease in wound volume by X% from date:  (specify in notes) Date Initiated: 10/26/2022 Date Inactivated: 01/04/2023 Target Resolution Date: 11/24/2022 Goal Status: Met Patient/caregiver will verbalize understanding of skin care regimen Date Initiated: 10/26/2022 Date Inactivated: 01/04/2023 Target Resolution Date: 12/25/2022 Goal Status: Met Ulcer/skin breakdown will have a volume reduction of 30% by week 4 Date Initiated: 10/26/2022 Date Inactivated: 11/24/2022 Target Resolution Date: 11/24/2022 Goal Status: Unmet Unmet Reason: comoridities Ulcer/skin breakdown will have a volume reduction of 50% by week 8 Date Initiated: 10/26/2022 Date Inactivated: 01/19/2023 Target Resolution Date: 01/26/2023 Goal Status: Met Ulcer/skin breakdown will have a volume reduction of 80% by week 12 Date Initiated: 10/26/2022 Target Resolution Date: 02/24/2023 Goal Status: Active Ulcer/skin breakdown will heal within 14 weeks Date Initiated: 10/26/2022 Target Resolution Date: 03/09/2023 Goal Status: Active Interventions: Assess patient/caregiver ability to obtain necessary supplies Assess patient/caregiver ability to perform ulcer/skin care regimen upon admission and as needed Assess ulceration(s) every visit Provide education on smoking Provide education on ulcer and skin care Treatment Activities: Skin care regimen initiated : 10/26/2022 Smoking cessation education : 10/26/2022 Notes: Electronic Signature(s) Signed: 02/08/2023 4:13:25 PM By: Yevonne Pax RN Entered By: Yevonne Pax on 02/08/2023 10:17:16 [name] [name] ([id number]) [id number] Page 6 of 9 -------------------------------------------------------------------------------- Pain Assessment Details Patient Name: Date of Service: [name], [name] 02/08/2023 9:45 A M Medical Record Number: [id number] Patient Account Number: [id number] Date of Birth/Sex: Treating RN: 29-Apr-1961 (62 y.o. [name]) Yevonne Pax Primary Care Taquisha Phung: SYSTEM, PCP Other Clinician: Referring  Georgetta Crafton: Treating Veneta Sliter/Extender: Kaylyn Layer in Treatment: 15 Active Problems Location of Pain Severity and Description of Pain Patient Has Paino No Site Locations Pain Management and Medication Current Pain Management: Electronic Signature(s) Signed: 02/08/2023 4:13:25 PM By: Yevonne Pax RN Entered By: Yevonne Pax on 02/08/2023 10:16:49 -------------------------------------------------------------------------------- Patient/Caregiver Education Details Patient Name: Date of Service: [name] [name] 6/13/2024andnbsp9:45 A M Medical Record Number: [id number] Patient Account Number: [id number] Date of Birth/Gender: Treating RN: April 08, 1961 (62 y.o. [name] [name] Primary Care Physician: SYSTEM, PCP Other Clinician: Referring Physician: Treating Physician/Extender: Kaylyn Layer in Treatment: [address] Vencil [name] ([id number]) [id number] Page 7 of 9 Education Assessment Education Provided To: Patient Education Topics Provided Pressure: Handouts: Pressure Injury: Prevention and Offloading Methods: Explain/Verbal Responses: State content correctly Electronic Signature(s) Signed: 02/08/2023 4:13:25 PM By: Yevonne Pax RN Entered By: Yevonne Pax on 02/08/2023 10:17:32 -------------------------------------------------------------------------------- Wound Assessment Details Patient Name: Date of Service: [name], [name] RNEY [name] 02/08/2023 9:45 A M Medical Record Number: [id number] Patient Account Number: [id number] Date of Birth/Sex: Treating RN: 03-26-1961 (62 y.o. [name] [name] Primary Care Jozee Hammer: SYSTEM, PCP Other Clinician: Referring Richie Vadala: Treating Payne Garske/Extender: Kaylyn Layer in Treatment: 15 Wound Status Wound Number:  1 Primary Etiology: Diabetic Wound/Ulcer of the Lower Extremity Wound Location: Left, Anterior T Second oe Wound Status: Open Wounding Event:  Pressure Injury Comorbid Peripheral Venous Disease, Type II Diabetes, History: Osteomyelitis Date Acquired: 10/12/2022 Weeks Of Treatment: 15 Clustered Wound: No Pending Amputation On Presentation Photos Wound Measurements Length: (cm) 0.2 Width: (cm) 0.2 Depth: (cm) 0.1 Area: (cm) 0.031 Volume: (cm) 0.003 % Reduction in Area: 96.7% % Reduction in Volume: 99.4% Epithelialization: Large (67-100%) Tunneling: No Undermining: No Wound Description Classification: Grade 3 Exudate Amount: Medium DREWEY, HOLEN ([id number]) Exudate Type: Serosanguineous Exudate Color: red, brown Foul Odor After Cleansing: No Slough/Fibrino Yes [id number] Page 8 of 9 Wound Bed Granulation Amount: Large (67-100%) Exposed Structure Granulation Quality: Red, Pink Fascia Exposed: No Necrotic Amount: None Present (0%) Fat Layer (Subcutaneous Tissue) Exposed: Yes Tendon Exposed: No Muscle Exposed: No Joint Exposed: No Bone Exposed: No Treatment Notes Wound #1 (Toe Second) Wound Laterality: Left, Anterior Cleanser Soap and Water Discharge Instruction: Gently cleanse wound with antibacterial soap, rinse and pat dry prior to dressing wounds Peri-Wound Care Topical Primary Dressing Silvercel Small 2x2 (in/in) Discharge Instruction: Apply Silvercel Small 2x2 (in/in) pack into the wound Secondary Dressing Coverlet Latex-Free Fabric Adhesive Dressings Discharge Instruction: Knuckle Secured With Compression Wrap Compression Stockings Add-Ons Electronic Signature(s) Signed: 02/08/2023 4:13:25 PM By: Yevonne Pax RN Entered By: Yevonne Pax on 02/08/2023 10:30:36 -------------------------------------------------------------------------------- Vitals Details Patient Name: Date of Service: [name] [name] RNEY [name] 02/08/2023 9:45 A M Medical Record Number: [id number] Patient Account Number: [id number] Date of Birth/Sex: Treating RN: December 09, 1960 (62 y.o. [name]) Yevonne Pax Primary Care  Lumi Winslett: SYSTEM, PCP Other Clinician: Referring Tihanna Goodson: Treating Adeoluwa Silvers/Extender: Kaylyn Layer in Treatment: 15 Vital Signs Time Taken: 10:10 Temperature (F): 98 Height (in): 76 Pulse (bpm): 103 Weight (lbs): 232 Respiratory Rate (breaths/min): 18 Body Mass Index (BMI): 28.2 Blood Pressure (mmHg): 97/64 Reference Range: 80 - 120 mg / dl Electronic Signature(s) [name] [name], [name] [name] ([id number]) [id number] Page 9 of 9 Signed: 02/08/2023 4:13:25 PM By: Yevonne Pax RN Entered By: Yevonne Pax on 02/08/2023 10:16:40

## 2023-02-12 ENCOUNTER — Encounter: Payer: Medicare Other | Admitting: Physician Assistant

## 2023-02-12 DIAGNOSIS — E11621 Type 2 diabetes mellitus with foot ulcer: Secondary | ICD-10-CM | POA: Diagnosis not present

## 2023-02-12 LAB — GLUCOSE, CAPILLARY
Glucose-Capillary: 151 mg/dL — ABNORMAL HIGH (ref 70–99)
Glucose-Capillary: 112 mg/dL — ABNORMAL HIGH (ref 70–99)

## 2023-02-12 NOTE — Progress Notes (Signed)
GATLYN, LATRAY ([id number]) [id number] Page 1 of 2 Visit Report for 02/12/2023 HBO Details Patient Name: Date of Service: [name] [name], [name] [name] 02/12/2023 10:30 A M Medical Record Number: [id number] Patient Account Number: [id number] Date of Birth/Sex: Treating RN: 17-Feb-1961 (62 y.o. M) Primary Care Jerard Bays: SYSTEM, PCP Other Clinician: Referring Shukri Nistler: Treating Georgie Eduardo/Extender: Kaylyn Layer in Treatment: 15 HBO Treatment Course Details Treatment Course Number: 1 Ordering Quentavious Rittenhouse: [name] T Treatments Ordered: otal 40 HBO Treatment Start Date: 12/13/2022 HBO Indication: Chronic Refractory Osteomyelitis to left foot HBO Treatment Details Treatment Number: 28 Patient Type: Outpatient Chamber Type: Monoplace Chamber Serial #: [id number] Treatment Protocol: 2.0 ATA with 90 minutes oxygen, and no air breaks Treatment Details Compression Rate Down: 2.0 psi / minute De-Compression Rate Up: 1.5 psi / minute Air breaks and breathing Decompress Decompress Compress Tx Pressure Begins Reached periods Begins Ends (leave unused spaces blank) Chamber Pressure (ATA 1 2 ------2 1 ) Clock Time (24 hr) 10:31 10:43 - - - - - - 12:13 12:23 Treatment Length: 112 (minutes) Treatment Segments: 4 Vital Signs Capillary Blood Glucose Reference Range: 80 - 120 mg / dl HBO Diabetic Blood Glucose Intervention Range: <131 mg/dl or >425 mg/dl Type: Time Vitals Blood Respiratory Capillary Blood Glucose Pulse Action Pulse: Temperature: Taken: Pressure: Rate: Glucose (mg/dl): Meter #: Oximetry (%) Taken: Pre 10:25 140/80 123 18 97.9 151 1 98 none per protocol Post 12:23 136/80 99 18 98 112 7 98 none per protocol Treatment Response Treatment Toleration: Well Treatment Completion Status: Treatment Completed without Adverse Event Electronic Signature(s) Signed: 02/12/2023 3:28:58 PM By: Demetria Pore Signed: 02/12/2023 3:37:18 PM By: [name]  PA-C Entered By: Demetria Pore on 02/12/2023 12:58:46 [name] ([id number]) [id number] Page 2 of 2 -------------------------------------------------------------------------------- HBO Safety Checklist Details Patient Name: Date of Service: [name] [name], [name] [name] 02/12/2023 10:30 A M Medical Record Number: [id number] Patient Account Number: [id number] Date of Birth/Sex: Treating RN: Mar 17, 1961 (62 y.o. M) Primary Care Kara Mierzejewski: SYSTEM, PCP Other Clinician: Referring Farida Mcreynolds: Treating Dorothe Elmore/Extender: Kaylyn Layer in Treatment: 15 HBO Safety Checklist Items Safety Checklist Consent Form Signed Patient voided / foley secured and emptied When did you last eato 02/12/23 Last dose of injectable or oral agent n/a Ostomy pouch emptied and vented if applicable NA All implantable devices assessed, documented and approved NA Intravenous access site secured and place NA Valuables secured Linens and cotton and cotton/polyester blend (less than 51% polyester) Personal oil-based products / skin lotions / body lotions removed Wigs or hairpieces removed NA Smoking or tobacco materials removed Books / newspapers / magazines / loose paper removed Cologne, aftershave, perfume and deodorant removed Jewelry removed (may wrap wedding band) Make-up removed NA Hair care products removed Battery operated devices (external) removed NA Heating patches and chemical warmers removed NA Titanium eyewear removed NA Nail polish cured greater than 10 hours NA Casting material cured greater than 10 hours NA Hearing aids removed NA Loose dentures or partials removed NA Prosthetics have been removed NA Patient demonstrates correct use of air break device (if applicable) Patient concerns have been addressed Patient grounding bracelet on and cord attached to chamber Specifics for Inpatients (complete in addition to above) Medication sheet sent with  patient Intravenous medications needed or due during therapy sent with patient Drainage tubes (e.g. nasogastric tube or chest tube secured and vented) Endotracheal or Tracheotomy tube secured Cuff deflated of air and inflated with saline Airway suctioned Electronic Signature(s) Signed: 02/12/2023 3:28:58 PM By: Demetria Pore  Entered By: Demetria Pore on 02/12/2023 12:53:32

## 2023-02-12 NOTE — Progress Notes (Signed)
CLERANCE, UZZLE ([id number]) [id number] Page 1 of 2 Visit Report for 02/12/2023 Arrival Information Details Patient Name: Date of Service: [name] [name], [name] [name] 02/12/2023 10:30 A M Medical Record Number: [id number] Patient Account Number: [id number] Date of Birth/Sex: Treating RN: [birth year] (62 y.o. M) Primary Care Roch Quach: SYSTEM, PCP Other Clinician: Referring Geneen Dieter: Treating Ameerah Huffstetler/Extender: Kaylyn Layer in Treatment: 15 Visit Information History Since Last Visit Added or deleted any medications: No Patient Arrived: Ambulatory Any new allergies or adverse reactions: No Arrival Time: 10:36 Had a fall or experienced change in No Accompanied By: self activities of daily living that may affect Transfer Assistance: None risk of falls: Patient Identification Verified: Yes Signs or symptoms of abuse/neglect since last visito No Secondary Verification Process Completed: Yes Hospitalized since last visit: No Patient Requires Transmission-Based Precautions: No Implantable device outside of the clinic excluding No Patient Has Alerts: Yes cellular tissue based products placed in the center Patient Alerts: Diabetes Type 2 since last visit: R ABI 1.03 L 1.10 Pain Present Now: No Electronic Signature(s) Signed: 02/12/2023 3:28:58 PM By: Demetria Pore Entered By: Demetria Pore on 02/12/2023 12:53:25 -------------------------------------------------------------------------------- Encounter Discharge Information Details Patient Name: Date of Service: [name] Sells RNEY L. 02/12/2023 10:30 A M Medical Record Number: [id number] Patient Account Number: [id number] Date of Birth/Sex: Treating RN: Nov 21, 1960 (62 y.o. M) Primary Care Anav Lammert: SYSTEM, PCP Other Clinician: Referring Karrington Studnicka: Treating Donyel Nester/Extender: Kaylyn Layer in Treatment: 15 Encounter Discharge Information Items Discharge Condition: Stable Ambulatory  Status: Ambulatory Discharge Destination: Home Transportation: Private Auto Accompanied By: self Schedule Follow-up Appointment: No Clinical Summary of Care: [name] [name], [name] [name] ([id number]) [id number] Page 2 of 2 Electronic Signature(s) Signed: 02/12/2023 3:28:58 PM By: Demetria Pore Entered By: Demetria Pore on 02/12/2023 12:59:21 -------------------------------------------------------------------------------- Vitals Details Patient Name: Date of Service: [name] Sells RNEY L. 02/12/2023 10:30 A M Medical Record Number: [id number] Patient Account Number: [id number] Date of Birth/Sex: Treating RN: [birth year] (62 y.o. M) Primary Care Gail Vendetti: SYSTEM, PCP Other Clinician: Referring Derrika Ruffalo: Treating Zinia Innocent/Extender: Kaylyn Layer in Treatment: 15 Vital Signs Time Taken: 10:25 Temperature (F): 97.9 Height (in): 76 Pulse (bpm): 123 Weight (lbs): 232 Respiratory Rate (breaths/min): 18 Body Mass Index (BMI): 28.2 Blood Pressure (mmHg): 140/80 Capillary Blood Glucose (mg/dl): 244 Reference Range: 80 - 120 mg / dl Airway Pulse Oximetry (%): 98 Electronic Signature(s) Signed: 02/12/2023 3:28:58 PM By: Demetria Pore Entered By: Demetria Pore on 02/12/2023 12:53:28

## 2023-02-13 ENCOUNTER — Encounter: Payer: Medicare Other | Admitting: Physician Assistant

## 2023-02-13 DIAGNOSIS — E11621 Type 2 diabetes mellitus with foot ulcer: Secondary | ICD-10-CM | POA: Diagnosis not present

## 2023-02-13 LAB — GLUCOSE, CAPILLARY
Glucose-Capillary: 141 mg/dL — ABNORMAL HIGH (ref 70–99)
Glucose-Capillary: 117 mg/dL — ABNORMAL HIGH (ref 70–99)

## 2023-02-13 NOTE — Progress Notes (Signed)
SEIF, BAUL ([id number]) [id number] Page 1 of 2 Visit Report for 02/13/2023 Arrival Information Details Patient Name: Date of Service: [name] [name], [name] [name] 02/13/2023 10:30 A M Medical Record Number: [id number] Patient Account Number: [id number] Date of Birth/Sex: Treating RN: Jan 22, 1961 (62 y.o. M) Primary Care Latisha Lasch: SYSTEM, PCP Other Clinician: Referring Cherysh Epperly: Treating Mihran Lebarron/Extender: Kaylyn Layer in Treatment: 15 Visit Information History Since Last Visit Added or deleted any medications: No Patient Arrived: Ambulatory Any new allergies or adverse reactions: No Arrival Time: 10:15 Had a fall or experienced change in No Accompanied By: self activities of daily living that may affect Transfer Assistance: None risk of falls: Patient Identification Verified: Yes Signs or symptoms of abuse/neglect since last visito No Secondary Verification Process Completed: Yes Hospitalized since last visit: No Patient Requires Transmission-Based Precautions: No Implantable device outside of the clinic excluding No Patient Has Alerts: Yes cellular tissue based products placed in the center Patient Alerts: Diabetes Type 2 since last visit: R ABI 1.03 L 1.10 Pain Present Now: No Electronic Signature(s) Signed: 02/13/2023 3:49:47 PM By: Demetria Pore Entered By: Demetria Pore on 02/13/2023 14:06:51 -------------------------------------------------------------------------------- Encounter Discharge Information Details Patient Name: Date of Service: [name] Sells RNEY L. 02/13/2023 10:30 A M Medical Record Number: [id number] Patient Account Number: [id number] Date of Birth/Sex: Treating RN: [birth year] (62 y.o. M) Primary Care Kasten Leveque: SYSTEM, PCP Other Clinician: Referring Brooklen Runquist: Treating Axxel Gude/Extender: Kaylyn Layer in Treatment: 15 Encounter Discharge Information Items Discharge Condition: Stable Ambulatory  Status: Ambulatory Discharge Destination: Home Transportation: Private Auto Accompanied By: self Schedule Follow-up Appointment: Yes Clinical Summary of Care: ESTANISLADO, SCORE ([id number]) [id number] Page 2 of 2 Electronic Signature(s) Signed: 02/13/2023 3:49:47 PM By: Demetria Pore Entered By: Demetria Pore on 02/13/2023 14:11:06 -------------------------------------------------------------------------------- Vitals Details Patient Name: Date of Service: [name] Sells RNEY L. 02/13/2023 10:30 A M Medical Record Number: [id number] Patient Account Number: [id number] Date of Birth/Sex: Treating RN: [birth year] (62 y.o. M) Primary Care Jeriko Kowalke: SYSTEM, PCP Other Clinician: Referring Ivorie Uplinger: Treating Lovelee Forner/Extender: Kaylyn Layer in Treatment: 15 Vital Signs Time Taken: 10:00 Temperature (F): 98.0 Height (in): 76 Pulse (bpm): 109 Weight (lbs): 232 Respiratory Rate (breaths/min): 18 Body Mass Index (BMI): 28.2 Blood Pressure (mmHg): 122/80 Capillary Blood Glucose (mg/dl): 474 Reference Range: 80 - 120 mg / dl Airway Pulse Oximetry (%): 97 Electronic Signature(s) Signed: 02/13/2023 3:49:47 PM By: Demetria Pore Entered By: Demetria Pore on 02/13/2023 14:09:11

## 2023-02-14 ENCOUNTER — Encounter (HOSPITAL_BASED_OUTPATIENT_CLINIC_OR_DEPARTMENT_OTHER): Payer: Medicare Other | Admitting: Internal Medicine

## 2023-02-14 DIAGNOSIS — M86372 Chronic multifocal osteomyelitis, left ankle and foot: Secondary | ICD-10-CM | POA: Diagnosis not present

## 2023-02-14 DIAGNOSIS — E11621 Type 2 diabetes mellitus with foot ulcer: Secondary | ICD-10-CM | POA: Diagnosis not present

## 2023-02-14 DIAGNOSIS — L97524 Non-pressure chronic ulcer of other part of left foot with necrosis of bone: Secondary | ICD-10-CM | POA: Diagnosis not present

## 2023-02-14 DIAGNOSIS — L97512 Non-pressure chronic ulcer of other part of right foot with fat layer exposed: Secondary | ICD-10-CM

## 2023-02-14 LAB — GLUCOSE, CAPILLARY
Glucose-Capillary: 137 mg/dL — ABNORMAL HIGH (ref 70–99)
Glucose-Capillary: 101 mg/dL — ABNORMAL HIGH (ref 70–99)

## 2023-02-14 NOTE — Progress Notes (Signed)
TILAK, BRIETZKE ([id number]) [id number] Page 1 of 2 Visit Report for 02/14/2023 Arrival Information Details Patient Name: Date of Service: [name] [name], [name] [name] 02/14/2023 10:30 A M Medical Record Number: [id number] Patient Account Number: [id number] Date of Birth/Sex: Treating RN: [birth year] (62 y.o. M) Primary Care Tamir Wallman: SYSTEM, PCP Other Clinician: Referring Trenice Mesa: Treating Zinnia Tindall/Extender: Mallie Snooks in Treatment: 15 Visit Information History Since Last Visit Added or deleted any medications: No Patient Arrived: Ambulatory Any new allergies or adverse reactions: No Arrival Time: 10:30 Had a fall or experienced change in No Accompanied By: self activities of daily living that may affect Transfer Assistance: None risk of falls: Patient Requires Transmission-Based Precautions: No Signs or symptoms of abuse/neglect since last visito No Patient Has Alerts: Yes Hospitalized since last visit: No Patient Alerts: Diabetes Type 2 Implantable device outside of the clinic excluding No R ABI 1.03 L 1.10 cellular tissue based products placed in the center since last visit: Pain Present Now: No Electronic Signature(s) Signed: 02/14/2023 4:00:06 PM By: Demetria Pore Entered By: Demetria Pore on 02/14/2023 12:26:10 -------------------------------------------------------------------------------- Encounter Discharge Information Details Patient Name: Date of Service: [name] Sells RNEY L. 02/14/2023 10:30 A M Medical Record Number: [id number] Patient Account Number: [id number] Date of Birth/Sex: Treating RN: [birth year] (62 y.o. M) Primary Care Jamair Cato: SYSTEM, PCP Other Clinician: Referring River Mckercher: Treating Uriah Philipson/Extender: Mallie Snooks in Treatment: 15 Encounter Discharge Information Items Discharge Condition: Stable Ambulatory Status: Ambulatory Discharge Destination: Home Transportation: Private  Auto Accompanied By: self Schedule Follow-up Appointment: Yes Clinical Summary of Care: [name] [name], [name] [name] ([id number]) [id number] Page 2 of 2 Electronic Signature(s) Signed: 02/15/2023 4:40:27 PM By: Midge Aver MSN RN CNS WTA Previous Signature: 02/14/2023 4:00:06 PM Version By: Demetria Pore Entered By: Midge Aver on 02/15/2023 10:18:47 -------------------------------------------------------------------------------- Vitals Details Patient Name: Date of Service: [name] Sells RNEY L. 02/14/2023 10:30 A M Medical Record Number: [id number] Patient Account Number: [id number] Date of Birth/Sex: Treating RN: 17-Apr-1961 (62 y.o. M) Primary Care Jonothan Heberle: SYSTEM, PCP Other Clinician: Referring Edithe Dobbin: Treating Ege Muckey/Extender: Mallie Snooks in Treatment: 15 Vital Signs Time Taken: 10:30 Temperature (F): 98.0 Height (in): 76 Pulse (bpm): 117 Weight (lbs): 232 Respiratory Rate (breaths/min): 18 Body Mass Index (BMI): 28.2 Blood Pressure (mmHg): 124/80 Capillary Blood Glucose (mg/dl): 638 Reference Range: 80 - 120 mg / dl Airway Pulse Oximetry (%): 98 Electronic Signature(s) Signed: 02/14/2023 4:00:06 PM By: Demetria Pore Entered By: Demetria Pore on 02/14/2023 12:26:15

## 2023-02-15 ENCOUNTER — Encounter: Payer: Medicare Other | Admitting: Physician Assistant

## 2023-02-15 DIAGNOSIS — E11621 Type 2 diabetes mellitus with foot ulcer: Secondary | ICD-10-CM | POA: Diagnosis not present

## 2023-02-15 LAB — GLUCOSE, CAPILLARY: Glucose-Capillary: 139 mg/dL — ABNORMAL HIGH (ref 70–99)

## 2023-02-15 NOTE — Progress Notes (Signed)
[name] [name] ([id number]) [id number] Page 1 of 2 Visit Report for 02/14/2023 HBO Details Patient Name: Date of Service: [name] [name], [name] [name] 02/14/2023 10:30 A M Medical Record Number: [id number] Patient Account Number: [id number] Date of Birth/Sex: Treating RN: 1961-04-27 (62 y.o. M) Primary Care Jalexa Pifer: SYSTEM, PCP Other Clinician: Referring Brittain Hosie: Treating Rhiley Solem/Extender: Mallie Snooks in Treatment: 15 HBO Treatment Course Details Treatment Course Number: 1 Ordering Jessamyn Watterson: [name] T Treatments Ordered: otal 40 HBO Treatment Start Date: 12/13/2022 HBO Indication: Chronic Refractory Osteomyelitis to left foot HBO Treatment Details Treatment Number: 30 Patient Type: Outpatient Chamber Type: Monoplace Chamber Serial #: [id number] Treatment Protocol: 2.0 ATA with 90 minutes oxygen, and no air breaks Treatment Details Compression Rate Down: 2.0 psi / minute De-Compression Rate Up: 1.5 psi / minute Air breaks and breathing Decompress Decompress Compress Tx Pressure Begins Reached periods Begins Ends (leave unused spaces blank) Chamber Pressure (ATA 1 2 ------2 1 ) Clock Time (24 hr) 10:41 10:52 - - - - - - 12:24 12:34 Treatment Length: 113 (minutes) Treatment Segments: 4 Vital Signs Capillary Blood Glucose Reference Range: 80 - 120 mg / dl HBO Diabetic Blood Glucose Intervention Range: <131 mg/dl or >324 mg/dl Type: Time Vitals Blood Respiratory Capillary Blood Glucose Pulse Action Pulse: Temperature: Taken: Pressure: Rate: Glucose (mg/dl): Meter #: Oximetry (%) Taken: Pre 10:30 124/80 117 18 98 137 98 none per protocol Post 12:34 122/78 100 18 97.8 101 1 98 none per protocol Treatment Response Treatment Toleration: Well Treatment Completion Status: Treatment Completed without Adverse Event HBO Attestation I certify that I supervised this HBO treatment in accordance with Medicare guidelines. A trained  emergency response team is readily available per Yes hospital policies and procedures. Continue HBOT as ordered. Yes Electronic Signature(s) Signed: 02/15/2023 12:49:28 PM By: Geralyn Corwin DO Previous Signature: 02/14/2023 4:00:06 PM Version By: Demetria Pore Entered By: Geralyn Corwin on 02/15/2023 11:54:32 [name] ([id number]) [id number] Page 2 of 2 -------------------------------------------------------------------------------- HBO Safety Checklist Details Patient Name: Date of Service: [name] [name], [name] [name] 02/14/2023 10:30 A M Medical Record Number: [id number] Patient Account Number: [id number] Date of Birth/Sex: Treating RN: 01-18-1961 (62 y.o. M) Primary Care Glenwood Revoir: SYSTEM, PCP Other Clinician: Referring Izella Ybanez: Treating Rodel Glaspy/Extender: Mallie Snooks in Treatment: 15 HBO Safety Checklist Items Safety Checklist Consent Form Signed Patient voided / foley secured and emptied When did you last eato 02/14/23 ensure in clnic Last dose of injectable or oral agent n/a Ostomy pouch emptied and vented if applicable NA All implantable devices assessed, documented and approved NA Intravenous access site secured and place NA Valuables secured Linens and cotton and cotton/polyester blend (less than 51% polyester) Personal oil-based products / skin lotions / body lotions removed Wigs or hairpieces removed NA Smoking or tobacco materials removed NA Books / newspapers / magazines / loose paper removed Cologne, aftershave, perfume and deodorant removed Jewelry removed (may wrap wedding band) Make-up removed NA Hair care products removed Battery operated devices (external) removed NA Heating patches and chemical warmers removed NA Titanium eyewear removed NA Nail polish cured greater than 10 hours NA Casting material cured greater than 10 hours NA Hearing aids removed NA Loose dentures or partials  removed NA Prosthetics have been removed NA Patient demonstrates correct use of air break device (if applicable) Patient concerns have been addressed Patient grounding bracelet on and cord attached to chamber Specifics for Inpatients (complete in addition to above) Medication sheet sent with patient Intravenous medications needed or due during  therapy sent with patient Drainage tubes (e.g. nasogastric tube or chest tube secured and vented) Endotracheal or Tracheotomy tube secured Cuff deflated of air and inflated with saline Airway suctioned Electronic Signature(s) Signed: 02/14/2023 4:00:06 PM By: Demetria Pore Entered By: Demetria Pore on 02/14/2023 16:10:96

## 2023-02-16 ENCOUNTER — Encounter: Payer: Medicare Other | Admitting: Physician Assistant

## 2023-02-16 NOTE — Progress Notes (Signed)
BRYLEY, KOVACEVIC ([id number]) [id number] Page 1 of 8 Visit Report for 02/15/2023 Arrival Information Details Patient Name: Date of Service: [name] [name], [name] [name] 02/15/2023 9:15 A M Medical Record Number: [id number] Patient Account Number: [id number] Date of Birth/Sex: Treating RN: 22-Jul-1961 (62 y.o. Roel Cluck Primary Care Kayleana Waites: SYSTEM, PCP Other Clinician: Referring Addisyn Leclaire: Treating Talon Regala/Extender: Kaylyn Layer in Treatment: 16 Visit Information History Since Last Visit Added or deleted any medications: No Patient Arrived: Ambulatory Has Dressing in Place as Prescribed: Yes Arrival Time: 09:26 Pain Present Now: No Accompanied By: self Transfer Assistance: Manual Patient Identification Verified: Yes Secondary Verification Process Completed: Yes Patient Requires Transmission-Based Precautions: No Patient Has Alerts: Yes Patient Alerts: Diabetes Type 2 R ABI 1.03 L 1.10 Electronic Signature(s) Signed: 02/15/2023 4:40:27 PM By: Midge Aver MSN RN CNS WTA Entered By: Midge Aver on 02/15/2023 09:30:21 -------------------------------------------------------------------------------- Clinic Level of Care Assessment Details Patient Name: Date of Service: NICHALOS, [name] RNEY L. 02/15/2023 9:15 A M Medical Record Number: [id number] Patient Account Number: [id number] Date of Birth/Sex: Treating RN: 1961/08/10 (62 y.o. Roel Cluck Primary Care Tecla Mailloux: SYSTEM, PCP Other Clinician: Referring Lacresha Fusilier: Treating Kaydee Magel/Extender: Kaylyn Layer in Treatment: 16 Clinic Level of Care Assessment Items TOOL 4 Quantity Score X- 1 0 Use when only an EandM is performed on FOLLOW-UP visit ASSESSMENTS - Nursing Assessment / Reassessment X- 1 10 Reassessment of Co-morbidities (includes updates in patient status) X- 1 5 Reassessment of Adherence to Treatment Plan ASSESSMENTS - Wound and Skin A ssessment /  Reassessment X - Simple Wound Assessment / Reassessment - one wound 1 5 Dyckman, Tyriek L ([id number]) [id number] Page 2 of 8 []  - 0 Complex Wound Assessment / Reassessment - multiple wounds []  - 0 Dermatologic / Skin Assessment (not related to wound area) ASSESSMENTS - Focused Assessment []  - 0 Circumferential Edema Measurements - multi extremities []  - 0 Nutritional Assessment / Counseling / Intervention []  - 0 Lower Extremity Assessment (monofilament, tuning fork, pulses) []  - 0 Peripheral Arterial Disease Assessment (using hand held doppler) ASSESSMENTS - Ostomy and/or Continence Assessment and Care []  - 0 Incontinence Assessment and Management []  - 0 Ostomy Care Assessment and Management (repouching, etc.) PROCESS - Coordination of Care X - Simple Patient / Family Education for ongoing care 1 15 []  - 0 Complex (extensive) Patient / Family Education for ongoing care X- 1 10 Staff obtains Chiropractor, Records, T Results / Process Orders est []  - 0 Staff telephones HHA, Nursing Homes / Clarify orders / etc []  - 0 Routine Transfer to another Facility (non-emergent condition) []  - 0 Routine Hospital Admission (non-emergent condition) []  - 0 New Admissions / Manufacturing engineer / Ordering NPWT Apligraf, etc. , []  - 0 Emergency Hospital Admission (emergent condition) X- 1 10 Simple Discharge Coordination []  - 0 Complex (extensive) Discharge Coordination PROCESS - Special Needs []  - 0 Pediatric / Minor Patient Management []  - 0 Isolation Patient Management []  - 0 Hearing / Language / Visual special needs []  - 0 Assessment of Community assistance (transportation, D/C planning, etc.) []  - 0 Additional assistance / Altered mentation []  - 0 Support Surface(s) Assessment (bed, cushion, seat, etc.) INTERVENTIONS - Wound Cleansing / Measurement X - Simple Wound Cleansing - one wound 1 5 []  - 0 Complex Wound Cleansing - multiple wounds X- 1  5 Wound Imaging (photographs - any number of wounds) []  - 0 Wound Tracing (instead of photographs) X- 1 5 Simple Wound Measurement - one wound []  -  0 Complex Wound Measurement - multiple wounds INTERVENTIONS - Wound Dressings X - Small Wound Dressing one or multiple wounds 1 10 []  - 0 Medium Wound Dressing one or multiple wounds []  - 0 Large Wound Dressing one or multiple wounds []  - 0 Application of Medications - topical []  - 0 Application of Medications - injection INTERVENTIONS - Miscellaneous []  - 0 External ear exam []  - 0 Specimen Collection (cultures, biopsies, blood, body fluids, etc.) []  - 0 Specimen(s) / Culture(s) sent or taken to Lab for analysis TRUST, LEH ([id number]) [id number] Page 3 of 8 []  - 0 Patient Transfer (multiple staff / Nurse, adult / Similar devices) []  - 0 Simple Staple / Suture removal (25 or less) []  - 0 Complex Staple / Suture removal (26 or more) []  - 0 Hypo / Hyperglycemic Management (close monitor of Blood Glucose) []  - 0 Ankle / Brachial Index (ABI) - do not check if billed separately X- 1 5 Vital Signs Has the patient been seen at the hospital within the last three years: Yes Total Score: 85 Level Of Care: New/Established - Level 3 Electronic Signature(s) Signed: 02/15/2023 4:40:27 PM By: Midge Aver MSN RN CNS WTA Entered By: Midge Aver on 02/15/2023 09:59:32 -------------------------------------------------------------------------------- Encounter Discharge Information Details Patient Name: Date of Service: [name] [name] RNEY L. 02/15/2023 9:15 A M Medical Record Number: [id number] Patient Account Number: [id number] Date of Birth/Sex: Treating RN: [birth year] (62 y.o. Roel Cluck Primary Care Daegen Berrocal: SYSTEM, PCP Other Clinician: Referring Keilen Kahl: Treating Mcguire Gasparyan/Extender: Kaylyn Layer in Treatment: 16 Encounter Discharge Information Items Discharge Condition:  Stable Ambulatory Status: Ambulatory Discharge Destination: Home Transportation: Private Auto Accompanied By: self Schedule Follow-up Appointment: No Clinical Summary of Care: Electronic Signature(s) Signed: 02/15/2023 4:40:27 PM By: Midge Aver MSN RN CNS WTA Entered By: Midge Aver on 02/15/2023 10:17:23 -------------------------------------------------------------------------------- Lower Extremity Assessment Details Patient Name: Date of Service: [name] [name] RNEY L. 02/15/2023 9:15 A M Medical Record Number: [id number] Patient Account Number: [id number] Date of Birth/Sex: Treating RN: 1961/06/21 (62 y.o. Roel Cluck Primary Care Aaren Atallah: SYSTEM, PCP Other Clinician: JOHATHON, OVERTURF ([id number]) [id number] Page 4 of 8 Referring Nixxon Faria: Treating Isis Costanza/Extender: Kaylyn Layer in Treatment: 16 Electronic Signature(s) Signed: 02/15/2023 4:40:27 PM By: Midge Aver MSN RN CNS WTA Entered By: Midge Aver on 02/15/2023 09:58:11 -------------------------------------------------------------------------------- Multi Wound Chart Details Patient Name: Date of Service: [name] [name] RNEY L. 02/15/2023 9:15 A M Medical Record Number: [id number] Patient Account Number: [id number] Date of Birth/Sex: Treating RN: 11-Nov-1960 (62 y.o. Roel Cluck Primary Care Wahneta Derocher: SYSTEM, PCP Other Clinician: Referring Alfonso Carden: Treating Onyx Schirmer/Extender: Kaylyn Layer in Treatment: 16 Vital Signs Height(in): 76 Capillary Blood Glucose(mg/dl): 160 Weight(lbs): 737 Pulse(bpm): 103 Body Mass Index(BMI): 28.2 Blood Pressure(mmHg): 118/78 Temperature(F): 97.6 Respiratory Rate(breaths/min): 18 [1:Photos: No Photos Left, Anterior T Second oe Wound Location: Pressure Injury Wounding Event: Diabetic Wound/Ulcer of the Lower Primary Etiology: Extremity Peripheral Venous Disease, Type II Comorbid History: Diabetes, Osteomyelitis  10/12/2022 Date Acquired: 16  Weeks of Treatment: Healed - Epithelialized Wound Status: No Wound Recurrence: Yes Pending A mputation on Presentation: 0x0x0 Measurements L x W x D (cm) 0 A (cm) : rea 0 Volume (cm) : 100.00% % Reduction in A rea: 100.00% % Reduction in Volume: Grade 3  Classification: Medium Exudate A mount: Serosanguineous Exudate Type: red, brown Exudate Color: Large (67-100%) Granulation A mount: Red, Pink Granulation Quality: None Present (0%) Necrotic A mount: Fat Layer (Subcutaneous Tissue): Yes N/A Exposed  Structures: Fascia: No Tendon: No Muscle: No Joint: No Bone: No Large (67-100%) Epithelialization: Debridement - Selective/Open Wound N/A Debridement: Pre-procedure Verification/Time Out 09:54 Taken: Lidocaine 4% Topical Solution Pain Control:  Skin/Epidermis Level: 0.01 Debridement A (sq cm): rea Curette Instrument: None Bleeding:] [N/A:N/A N/A N/A N/A N/A N/A N/A N/A N/A N/A N/A N/A N/A N/A N/A N/A N/A N/A N/A N/A N/A N/A N/A N/A N/A N/A N/A N/A N/A] MIKHAIL, HALLENBECK ([id number]) [1:Gel Foam Hemostasis Achieved: 0 Procedural Pain: 0 Post Procedural Pain: Procedure was tolerated well Debridement Treatment Response: 0.1x0.1x0.1 Post Debridement Measurements L x W x D (cm) 0.001 Post Debridement Volume: (cm) Debridement Procedures  Performed:] [N/A:N/A N/A N/A N/A N/A N/A N/A] Treatment Notes Electronic Signature(s) Signed: 02/15/2023 4:40:27 PM By: Midge Aver MSN RN CNS WTA Entered By: Midge Aver on 02/15/2023 09:58:16 -------------------------------------------------------------------------------- Multi-Disciplinary Care Plan Details Patient Name: Date of Service: [name] [name] RNEY L. 02/15/2023 9:15 A M Medical Record Number: [id number] Patient Account Number: [id number] Date of Birth/Sex: Treating RN: 23-Mar-1961 (62 y.o. Roel Cluck Primary Care Aireona Torelli: SYSTEM, PCP Other Clinician: Referring Angelena Sand: Treating Cartier Washko/Extender: Kaylyn Layer  in Treatment: 16 Active Inactive Electronic Signature(s) Signed: 02/15/2023 4:40:27 PM By: Midge Aver MSN RN CNS WTA Entered By: Midge Aver on 02/15/2023 10:16:14 -------------------------------------------------------------------------------- Pain Assessment Details Patient Name: Date of Service: [name] [name] RNEY L. 02/15/2023 9:15 A M Medical Record Number: [id number] Patient Account Number: [id number] Date of Birth/Sex: Treating RN: [birth year] (62 y.o. Roel Cluck Primary Care Deklyn Gibbon: SYSTEM, PCP Other Clinician: Referring Kimyetta Flott: Treating La Dibella/Extender: Kaylyn Layer in Treatment: 16 Active Problems Location of Pain Severity and Description of Pain Patient Has Paino No Site Locations [place] ([id number]) [id number] Page 6 of 8 Pain Management and Medication Current Pain Management: Electronic Signature(s) Signed: 02/15/2023 4:40:27 PM By: Midge Aver MSN RN CNS WTA Entered By: Midge Aver on 02/15/2023 09:57:04 -------------------------------------------------------------------------------- Patient/Caregiver Education Details Patient Name: Date of Service: Ashley Jacobs 6/20/2024andnbsp9:15 A M Medical Record Number: [id number] Patient Account Number: [id number] Date of Birth/Gender: Treating RN: 03-Dec-1960 (62 y.o. Roel Cluck Primary Care Physician: SYSTEM, PCP Other Clinician: Referring Physician: Treating Physician/Extender: Kaylyn Layer in Treatment: 16 Education Assessment Education Provided To: Patient Education Topics Provided Discharge Packet: Handouts: Diabetes and Diabetic Ulcers, Foot Care Electronic Signature(s) Signed: 02/15/2023 4:40:27 PM By: Midge Aver MSN RN CNS WTA Entered By: Midge Aver on 02/15/2023 10:16:47 [name] ([id number]) [id number] Page 7 of  8 -------------------------------------------------------------------------------- Wound Assessment Details Patient Name: Date of Service: RAQUAN, IANNONE 02/15/2023 9:15 A M Medical Record Number: [id number] Patient Account Number: [id number] Date of Birth/Sex: Treating RN: Nov 03, 1960 (62 y.o. Roel Cluck Primary Care Shamar Kracke: SYSTEM, PCP Other Clinician: Referring Trezure Cronk: Treating Ednamae Schiano/Extender: Kaylyn Layer in Treatment: 16 Wound Status Wound Number: 1 Primary Etiology: Diabetic Wound/Ulcer of the Lower Extremity Wound Location: Left, Anterior T Second oe Wound Status: Healed - Epithelialized Wounding Event: Pressure Injury Comorbid Peripheral Venous Disease, Type II Diabetes, History: Osteomyelitis Date Acquired: 10/12/2022 Weeks Of Treatment: 16 Clustered Wound: No Pending Amputation On Presentation Wound Measurements Length: (cm) Width: (cm) Depth: (cm) Area: (cm) Volume: (cm) 0 % Reduction in Area: 100% 0 % Reduction in Volume: 100% 0 Epithelialization: Large (67-100%) 0 0 Wound Description Classification: Grade 3 Exudate Amount: Medium Exudate Type: Serosanguineous Exudate Color: red, brown Foul Odor After Cleansing: No Slough/Fibrino Yes Wound Bed Granulation Amount: Large (67-100%) Exposed Structure Granulation Quality:  Red, Pink Fascia Exposed: No Necrotic Amount: None Present (0%) Fat Layer (Subcutaneous Tissue) Exposed: Yes Tendon Exposed: No Muscle Exposed: No Joint Exposed: No Bone Exposed: No Treatment Notes Wound #1 (Toe Second) Wound Laterality: Left, Anterior Cleanser Peri-Wound Care Topical Primary Dressing Secondary Dressing Secured With Compression Wrap Compression Stockings Add-Ons Electronic Signature(s) Signed: 02/15/2023 4:40:27 PM By: Midge Aver MSN RN CNS [name], Guss Bunde ([id number]) [id number] Page 8 of 8 Entered By: Midge Aver on 02/15/2023  09:58:06 -------------------------------------------------------------------------------- Vitals Details Patient Name: Date of Service: NANDAN, WILLEMS 02/15/2023 9:15 A M Medical Record Number: [id number] Patient Account Number: [id number] Date of Birth/Sex: Treating RN: 1961/03/18 (62 y.o. Roel Cluck Primary Care Mckinzi Eriksen: SYSTEM, PCP Other Clinician: Referring Rick Carruthers: Treating [place]/Extender: Kaylyn Layer in Treatment: 16 Vital Signs Time Taken: 09:32 Temperature (F): 97.6 Height (in): 76 Pulse (bpm): 103 Weight (lbs): 232 Respiratory Rate (breaths/min): 18 Body Mass Index (BMI): 28.2 Blood Pressure (mmHg): 118/78 Capillary Blood Glucose (mg/dl): 295 Reference Range: 80 - 120 mg / dl Electronic Signature(s) Signed: 02/15/2023 4:40:27 PM By: Midge Aver MSN RN CNS WTA Entered By: Midge Aver on 02/15/2023 09:56:57

## 2023-02-16 NOTE — Progress Notes (Signed)
ALEE, KATEN ([id number]) [id number] Page 1 of 7 Visit Report for 02/15/2023 Chief Complaint Document Details Patient Name: Date of Service: [name] [name], [name] [name] 02/15/2023 9:15 A M Medical Record Number: [id number] Patient Account Number: [id number] Date of Birth/Sex: Treating RN: 06-Apr-1961 (62 y.o. M) Primary Care Provider: SYSTEM, PCP Other Clinician: Referring Provider: Treating Provider/Extender: Kaylyn Layer in Treatment: 16 Information Obtained from: Patient Chief Complaint Bilateral Foot Ulcers Electronic Signature(s) Signed: 02/15/2023 10:10:59 AM By: [name] PA-C Entered By: [name] on 02/15/2023 10:10:58 -------------------------------------------------------------------------------- HPI Details Patient Name: Date of Service: [name] Sells RNEY [name] 02/15/2023 9:15 A M Medical Record Number: [id number] Patient Account Number: [id number] Date of Birth/Sex: Treating RN: [birth year] (62 y.o. M) Primary Care Provider: SYSTEM, PCP Other Clinician: Referring Provider: Treating Provider/Extender: Kaylyn Layer in Treatment: 16 History of Present Illness HPI Description: 10-26-2022 upon evaluation today patient presents for initial inspection here in our clinic concerning issues that he has been having with wounds over the right dorsal foot and left distal toe on the dorsal surface more so. With that being said he has had an MRI of the right foot which showed no signs of osteomyelitis I see no x-rays nor MRI of the left foot at all at this point. Nonetheless the left foot actually is the one that appears there could be evidence of infection in the toe that is the 1 I am most concerned about. He is also going to require debridement of both locations I discussed that with him today. The patient's MRI was actually on 10-22-2022 which showed cellulitis of the right foot but no osteomyelitis. Has been on cefadroxil  which was prescribed on 10-25-2022. With that being said it is appearing to me that the patient likely has a infection in regard to the second toe left foot where his other wound is and this is the one that has been most concerned. He actually has bone exposed I am going to perform debridement I want to obtain samples as well to send for pathology and culture. Patient has a past medical history which is significant for diabetes mellitus type 2 with his most recent hemoglobin A1c being 5.1. 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 He also has amputation of the right second toe. The patient also has significant past medical history for chronic venous hypertension. Patient's ABIs were performed on 10-07-2022 showed a right ABI of 1.03 in the left ABI of 1.10. 3/7; this was a patient who is new to our clinic last week. He has a wound on his left second toe anteriorly and the right dorsal foot. Earlier this week I received the culture report and organism responsible for the infection. I was not even sure how did identify this. The bone culture from last week showed osteomyelitis [name] [name], [name] [name] ([id number]) [id number] Page 2 of 7 on the left second toe.. The area on the right dorsal foot looks a lot better no evidence of infection 11-24-2022 upon evaluation today patient appears to be doing poorly in regard to his left second toe although the first toe right foot actually looks better in my opinion. I did review his pathology and culture as well and it showed bone necrosis but no definitive osteomyelitis. The culture nonetheless did show with the bone there was bacteria isolated. He has been on the Bactrim although that was for 2 weeks from Dr. Leanord Hawking  this is the first time I am seeing him since that point. Honestly I am not certain if we should continue that or if he needs something different I did actually order an  x-ray unfortunately there was confusion and in the end we did not end up getting the x-ray. This was on the left foot and interned it was presumed that this was done but what was actually sent which is the x-ray of the right foot which is not what we needed. He has had an x-ray and MRI of the right foot neither of the left foot. For that reason I would have to send him today for the left foot x-ray. 12-06-2021 upon evaluation today patient appears to be doing about the same in regard to his toe ulcer. We have subsequently up to this point been treating him for infection. We do have confirmation based on what we see currently with the MRI that he still is continuing to have issues right now with the infection of his toes. There is actually 2 locations 1 that has a wound 1 that does not. With that being said he has been on the Bactrim since 12 February. I think that that has helped to some degree to keep this under control but not fully. He still is taking that at this point and I am actually can renew that today for him to continue the plan. He is in agreement with that plan. Nonetheless he is going to require some additional help to get this close I do believe that I believe for limb salvage it would benefit him to proceed with HBO therapy. We have discussed this previously and loose detail now in greater detail today. 12-15-2022 upon evaluation today patient appears to be doing well currently in regard to his wound. He has been tolerating the dressing changes without complication. Fortunately there does not appear to be any signs of active infection systemically which is great news. 12-21-2022 upon evaluation today patient appears to be doing well currently in regard to his toe ulcer though this is going require some debridement today. Fortunately I do not see any signs of active infection locally nor systemically which is great news. 01-04-2023 upon evaluation today patient appears to be doing well  currently in regard to his wound. He has been tolerating the dressing changes without complication. He does require some sharp debridement today but we are going to go ahead and work on this for him as quickly as possible and try to get things moving along using hyperbarics and does seem to be tolerating that quite well. 5/24; this is a patient currently undergoing HBO for a diabetic foot ulcer involving the left second toe. He is using silver alginate. Our intake nurse reports odor, dirty wound. Dressing not being changed as ordered [3 times per week]. He is tolerating HBO however and according to the records this seems to be helping. 02-08-2023 upon evaluation patient actually appears to be making some really good progress towards closure. In fact I think that he is very close to completely being closed if not closed. Then it looks like there may have been a pinpoint opening still remaining today and I was not convinced that we could completely close this out at this point. Nonetheless we will get a continue to monitor and see how things do over the next week. 02-15-2023 upon evaluation today patient appears to be doing well in regard to his wound. In fact this appears to be completely healed based  on what I am seeing. This is excellent news and I am very pleased he is extremely happy about this. Electronic Signature(s) Signed: 02/15/2023 10:46:30 AM By: [name] PA-C Entered By: [name] on 02/15/2023 10:46:29 -------------------------------------------------------------------------------- Physical Exam Details Patient Name: Date of Service: [name] [name], [name] RNEY [name] 02/15/2023 9:15 A M Medical Record Number: [id number] Patient Account Number: [id number] Date of Birth/Sex: Treating RN: 06-Jun-1961 (62 y.o. M) Primary Care Provider: SYSTEM, PCP Other Clinician: Referring Provider: Treating Provider/Extender: Kaylyn Layer in Treatment: 16 Constitutional Well-nourished and  well-hydrated in no acute distress. Respiratory normal breathing without difficulty. Psychiatric this patient is able to make decisions and demonstrates good insight into disease process. Alert and Oriented x 3. pleasant and cooperative. Notes Upon inspection patient's wound bed showed signs of complete epithelization I double check this just to make sure that there is nothing that was actually hiding an open and everything appeared to be completely cleared which was great news. In general I am going to recommend that we go ahead and discontinue wound care at this point as he seems healed and I am also going to discontinue HBO therapy at this point as he appears to be completely clear from that standpoint. Electronic Signature(s) [name] [name], [name] [name] ([id number]) [id number] Page 3 of 7 Signed: 02/15/2023 10:46:56 AM By: [name] PA-C Entered By: [name] on 02/15/2023 10:46:55 -------------------------------------------------------------------------------- Physician Orders Details Patient Name: Date of Service: [name] Sells RNEY [name] 02/15/2023 9:15 A M Medical Record Number: [id number] Patient Account Number: [id number] Date of Birth/Sex: Treating RN: 1961-07-19 (62 y.o. [name] [name] Primary Care Provider: SYSTEM, PCP Other Clinician: Referring Provider: Treating Provider/Extender: Kaylyn Layer in Treatment: 16 Verbal / Phone Orders: No Diagnosis Coding Discharge From [facility] Services Discharge from Wound Care Center Treatment Complete - protect with band aid for a week Electronic Signature(s) Signed: 02/15/2023 4:40:27 PM By: Midge Aver MSN RN CNS WTA Signed: 02/15/2023 5:52:14 PM By: [name] PA-C Entered By: Midge Aver on 02/15/2023 09:59:00 -------------------------------------------------------------------------------- Problem List Details Patient Name: Date of Service: [name] Sells RNEY [name] 02/15/2023 9:15 A M Medical Record  Number: [id number] Patient Account Number: [id number] Date of Birth/Sex: Treating RN: 05/06/1961 (62 y.o. M) Primary Care Provider: SYSTEM, PCP Other Clinician: Referring Provider: Treating Provider/Extender: Kaylyn Layer in Treatment: 16 Active Problems ICD-10 Encounter Code Description Active Date MDM Diagnosis M86.372 Chronic multifocal osteomyelitis, left ankle and foot 12/07/2022 No Yes E11.621 Type 2 diabetes mellitus with foot ulcer 10/26/2022 No Yes L97.524 Non-pressure chronic ulcer of other part of left foot with necrosis of bone 10/26/2022 No Yes [name] [name], [name] [name] ([id number]) [id number] Page 4 of 7 [phone] Non-pressure chronic ulcer of other part of right foot with fat layer exposed 10/26/2022 No Yes M14.672 Charcot's joint, left ankle and foot 10/26/2022 No Yes 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 10/26/2022 No Yes I87.323 Chronic venous hypertension (idiopathic) with inflammation of bilateral lower 10/26/2022 No Yes extremity F17.218 Nicotine dependence, cigarettes, with other nicotine-induced disorders 10/26/2022 No Yes Inactive Problems Resolved Problems Electronic Signature(s) Signed: 02/15/2023 10:10:54 AM By: [name] PA-C Entered By: [name] on 02/15/2023 10:10:54 -------------------------------------------------------------------------------- Progress Note Details Patient Name: Date of Service: [name] Sells RNEY [name] 02/15/2023 9:15 A M Medical Record Number: [id number] Patient Account Number: [id number] Date of Birth/Sex: Treating RN: [birth year] (62 y.o. M) Primary Care Provider: SYSTEM, PCP Other Clinician: Referring Provider: Treating Provider/Extender: Kaylyn Layer in Treatment: 16 Subjective Chief Complaint Information obtained from  Patient Bilateral Foot Ulcers History of Present Illness (HPI) 10-26-2022 upon evaluation today patient presents for initial inspection here in our  clinic concerning issues that he has been having with wounds over the right dorsal foot and left distal toe on the dorsal surface more so. With that being said he has had an MRI of the right foot which showed no signs of osteomyelitis I see no x-rays nor MRI of the left foot at all at this point. Nonetheless the left foot actually is the one that appears there could be evidence of infection in the toe that is the 1 I am most concerned about. He is also going to require debridement of both locations I discussed that with him today. The patient's MRI was actually on 10-22-2022 which showed cellulitis of the right foot but no osteomyelitis. Has been on cefadroxil which was prescribed on 10-25-2022. With that being said it is appearing to me that the patient likely has a infection in regard to the second toe left foot where his other wound is and this is the one that has been most concerned. He actually has bone exposed I am going to perform debridement I want to obtain samples as well to send for pathology and culture. Patient has a past medical history which is significant for diabetes mellitus type 2 with his most recent hemoglobin A1c being 5.1. 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 He also has amputation of the right second toe. The patient also has significant past medical history for chronic venous hypertension. Patient's ABIs were performed on 10-07-2022 showed a right ABI of 1.03 in the left ABI of 1.10. 3/7; this was a patient who is new to our clinic last week. He has a wound on his left second toe anteriorly and the right dorsal foot. Earlier this week I received the culture report and organism responsible for the infection. I was not even sure how did identify this. The bone culture from last week showed osteomyelitis [name] [name], [name] [name] ([id number]) [id number] Page 5 of 7 on the left second toe.. The area  on the right dorsal foot looks a lot better no evidence of infection 11-24-2022 upon evaluation today patient appears to be doing poorly in regard to his left second toe although the first toe right foot actually looks better in my opinion. I did review his pathology and culture as well and it showed bone necrosis but no definitive osteomyelitis. The culture nonetheless did show with the bone there was bacteria isolated. He has been on the Bactrim although that was for 2 weeks from Dr. Leanord Hawking this is the first time I am seeing him since that point. Honestly I am not certain if we should continue that or if he needs something different I did actually order an x-ray unfortunately there was confusion and in the end we did not end up getting the x-ray. This was on the left foot and interned it was presumed that this was done but what was actually sent which is the x-ray of the right foot which is not what we needed. He has had an x-ray and MRI of the right foot neither of the left foot. For that reason I would have to send him today for the left foot x-ray. 12-06-2021 upon evaluation today patient appears to be doing about the same in regard to his toe ulcer. We have subsequently up to this point  been treating him for infection. We do have confirmation based on what we see currently with the MRI that he still is continuing to have issues right now with the infection of his toes. There is actually 2 locations 1 that has a wound 1 that does not. With that being said he has been on the Bactrim since 12 February. I think that that has helped to some degree to keep this under control but not fully. He still is taking that at this point and I am actually can renew that today for him to continue the plan. He is in agreement with that plan. Nonetheless he is going to require some additional help to get this close I do believe that I believe for limb salvage it would benefit him to proceed with HBO therapy. We have  discussed this previously and loose detail now in greater detail today. 12-15-2022 upon evaluation today patient appears to be doing well currently in regard to his wound. He has been tolerating the dressing changes without complication. Fortunately there does not appear to be any signs of active infection systemically which is great news. 12-21-2022 upon evaluation today patient appears to be doing well currently in regard to his toe ulcer though this is going require some debridement today. Fortunately I do not see any signs of active infection locally nor systemically which is great news. 01-04-2023 upon evaluation today patient appears to be doing well currently in regard to his wound. He has been tolerating the dressing changes without complication. He does require some sharp debridement today but we are going to go ahead and work on this for him as quickly as possible and try to get things moving along using hyperbarics and does seem to be tolerating that quite well. 5/24; this is a patient currently undergoing HBO for a diabetic foot ulcer involving the left second toe. He is using silver alginate. Our intake nurse reports odor, dirty wound. Dressing not being changed as ordered [3 times per week]. He is tolerating HBO however and according to the records this seems to be helping. 02-08-2023 upon evaluation patient actually appears to be making some really good progress towards closure. In fact I think that he is very close to completely being closed if not closed. Then it looks like there may have been a pinpoint opening still remaining today and I was not convinced that we could completely close this out at this point. Nonetheless we will get a continue to monitor and see how things do over the next week. 02-15-2023 upon evaluation today patient appears to be doing well in regard to his wound. In fact this appears to be completely healed based on what I am seeing. This is excellent news and I am  very pleased he is extremely happy about this. Objective Constitutional Well-nourished and well-hydrated in no acute distress. Vitals Time Taken: 9:32 AM, Height: 76 in, Weight: 232 lbs, BMI: 28.2, Temperature: 97.6 F, Pulse: 103 bpm, Respiratory Rate: 18 breaths/min, Blood Pressure: 118/78 mmHg, Capillary Blood Glucose: 139 mg/dl. Respiratory normal breathing without difficulty. Psychiatric this patient is able to make decisions and demonstrates good insight into disease process. Alert and Oriented x 3. pleasant and cooperative. General Notes: Upon inspection patient's wound bed showed signs of complete epithelization I double check this just to make sure that there is nothing that was actually hiding an open and everything appeared to be completely cleared which was great news. In general I am going to recommend that we go  ahead and discontinue wound care at this point as he seems healed and I am also going to discontinue HBO therapy at this point as he appears to be completely clear from that standpoint. Integumentary (Hair, Skin) Wound #1 status is Healed - Epithelialized. Original cause of wound was Pressure Injury. The date acquired was: 10/12/2022. The wound has been in treatment 16 weeks. The wound is located on the Left,Anterior T Second. The wound measures 0cm length x 0cm width x 0cm depth; 0cm^2 area and 0cm^3 volume. oe There is Fat Layer (Subcutaneous Tissue) exposed. There is a medium amount of serosanguineous drainage noted. There is large (67-100%) red, pink granulation within the wound bed. There is no necrotic tissue within the wound bed. 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 I am good recommend that we have the patient going discontinue both wound care services and HBO therapy as he appears to be completely healed at this time. 2. I am also can recommend that he should continue to monitor for any signs of infection or worsening. Obviously if anything changes he knows that he can contact the office and let me know but otherwise my hope is he will remain closed I am extremely pleased for him with how well this is doing. Will see him back for follow-up visit as needed. Electronic Signature(s) Signed: 02/15/2023 10:47:28 AM By: [name] PA-C Entered By: [name] on 02/15/2023 10:47:28 -------------------------------------------------------------------------------- SuperBill Details Patient Name: Date of Service: [name] Sells RNEY Elbert Ewings 02/15/2023 Medical Record Number: [id number] Patient Account Number: [id number] Date of Birth/Sex: Treating RN: Jul 15, 1961 (62 y.o. [name] [name] Primary Care Provider: SYSTEM, PCP Other Clinician: Referring Provider: Treating Provider/Extender: Kaylyn Layer in Treatment: 16 Diagnosis Coding ICD-10 Codes Code Description [phone] Chronic multifocal osteomyelitis, left ankle and foot E11.621 Type 2 diabetes mellitus with foot ulcer L97.524 Non-pressure chronic ulcer of other part of left foot with necrosis of bone L97.512 Non-pressure chronic ulcer of other part of right foot with fat layer exposed M14.672 Charcot's joint, left ankle and foot 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 I87.323 Chronic venous hypertension  (idiopathic) with inflammation of bilateral lower extremity F17.218 Nicotine dependence, cigarettes, with other nicotine-induced disorders Facility Procedures : CPT4 Code: [id number] Description: 99213 - WOUND CARE VISIT-LEV 3 EST PT Modifier: Quantity: 1 Physician Procedures : CPT4 Code Description Modifier [id number] - WC PHYS LEVEL 3 - EST PT ICD-10 Diagnosis Description M86.372 Chronic multifocal osteomyelitis, left ankle and foot E11.621 Type 2 diabetes mellitus with foot ulcer L97.524 Non-pressure chronic ulcer of  other part of left foot with necrosis of bone [name] [name], [name] [name] ([id number]) [id number] L97.512 Non-pressure chronic ulcer of other part of right foot with fat layer exposed Quantity: 1 7.pdf Page 7 of 7 Electronic Signature(s) Signed: 02/15/2023 10:47:42 AM By: [name] PA-C Entered By: [name] on 02/15/2023 10:47:42

## 2023-02-16 NOTE — Progress Notes (Signed)
[name] [name], [name] [name] ([id number]) [id number] Page 1 of 2 Visit Report for 02/13/2023 HBO Details Patient Name: Date of Service: [name] [name], [name] [name] 02/13/2023 10:30 A M Medical Record Number: [id number] Patient Account Number: [id number] Date of Birth/Sex: Treating RN: 1961-04-09 (62 y.o. M) Primary Care Arabell Neria: SYSTEM, PCP Other Clinician: Referring Amybeth Sieg: Treating Riely Oetken/Extender: Kaylyn Layer in Treatment: 15 HBO Treatment Course Details Treatment Course Number: 1 Ordering Kiandra Sanguinetti: [name] T Treatments Ordered: otal 40 HBO Treatment Start Date: 12/13/2022 HBO Indication: Chronic Refractory Osteomyelitis to left foot HBO Treatment Details Treatment Number: 29 Patient Type: Outpatient Chamber Type: Monoplace Chamber Serial #: [id number] Treatment Protocol: 2.0 ATA with 90 minutes oxygen, and no air breaks Treatment Details Compression Rate Down: 2.0 psi / minute De-Compression Rate Up: 1.5 psi / minute Air breaks and breathing Decompress Decompress Compress Tx Pressure Begins Reached periods Begins Ends (leave unused spaces blank) Chamber Pressure (ATA 1 2 ------2 1 ) Clock Time (24 hr) 10:33 10:44 - - - - - - 12:15 12:25 Treatment Length: 112 (minutes) Treatment Segments: 4 Vital Signs Capillary Blood Glucose Reference Range: 80 - 120 mg / dl HBO Diabetic Blood Glucose Intervention Range: <131 mg/dl or >324 mg/dl Type: Time Vitals Blood Respiratory Capillary Blood Glucose Pulse Action Pulse: Temperature: Taken: Pressure: Rate: Glucose (mg/dl): Meter #: Oximetry (%) Taken: Pre 10:00 122/80 109 18 98 141 1 97 none per protocol Post 12:25 124/80 100 18 97.8 117 1 98 none per protocol Treatment Response Treatment Toleration: Well Treatment Completion Status: Treatment Completed without Adverse Event Electronic Signature(s) Signed: 02/13/2023 3:49:47 PM By: Demetria Pore Signed: 02/15/2023 5:52:14 PM By: [name]  PA-C Entered By: Demetria Pore on 02/13/2023 14:10:33 [name] ([id number]) [id number] Page 2 of 2 -------------------------------------------------------------------------------- HBO Safety Checklist Details Patient Name: Date of Service: [name] [name], [name] [name] 02/13/2023 10:30 A M Medical Record Number: [id number] Patient Account Number: [id number] Date of Birth/Sex: Treating RN: [birth year] (62 y.o. M) Primary Care Wanita Derenzo: SYSTEM, PCP Other Clinician: Referring Ludia Gartland: Treating Quindarrius J[place]lin/Extender: Kaylyn Layer in Treatment: 15 HBO Safety Checklist Items Safety Checklist Consent Form Signed Patient voided / foley secured and emptied When did you last eato 02/13/23 in clinic Last dose of injectable or oral agent Ostomy pouch emptied and vented if applicable NA All implantable devices assessed, documented and approved NA Intravenous access site secured and place NA Valuables secured Linens and cotton and cotton/polyester blend (less than 51% polyester) Personal oil-based products / skin lotions / body lotions removed Wigs or hairpieces removed NA Smoking or tobacco materials removed NA Books / newspapers / magazines / loose paper removed Cologne, aftershave, perfume and deodorant removed Jewelry removed (may wrap wedding band) Make-up removed NA Hair care products removed Battery operated devices (external) removed NA Heating patches and chemical warmers removed NA Titanium eyewear removed NA Nail polish cured greater than 10 hours NA Casting material cured greater than 10 hours NA Hearing aids removed NA Loose dentures or partials removed NA Prosthetics have been removed NA Patient demonstrates correct use of air break device (if applicable) Patient concerns have been addressed Patient grounding bracelet on and cord attached to chamber Specifics for Inpatients (complete in addition to above) Medication sheet sent  with patient Intravenous medications needed or due during therapy sent with patient Drainage tubes (e.g. nasogastric tube or chest tube secured and vented) Endotracheal or Tracheotomy tube secured Cuff deflated of air and inflated with saline Airway suctioned Electronic Signature(s) Signed: 02/13/2023 3:49:47 PM By:  Demetria Pore Entered By: Demetria Pore on 02/13/2023 14:09:15

## 2023-02-19 ENCOUNTER — Encounter: Payer: Medicare Other | Admitting: Physician Assistant

## 2023-02-20 ENCOUNTER — Encounter: Payer: Medicare Other | Admitting: Physician Assistant

## 2023-02-21 ENCOUNTER — Encounter: Payer: Medicare Other | Admitting: Internal Medicine

## 2023-02-22 ENCOUNTER — Encounter: Payer: Medicare Other | Admitting: Physician Assistant

## 2023-02-22 ENCOUNTER — Ambulatory Visit: Payer: Medicare Other | Admitting: Physician Assistant

## 2023-02-23 ENCOUNTER — Encounter: Payer: Medicare Other | Admitting: Physician Assistant

## 2023-02-26 ENCOUNTER — Encounter: Payer: Medicare Other | Admitting: Physician Assistant

## 2024-04-21 ENCOUNTER — Other Ambulatory Visit (HOSPITAL_BASED_OUTPATIENT_CLINIC_OR_DEPARTMENT_OTHER): Payer: Self-pay
